# Patient Record
Sex: Female | Born: 1935 | Race: White | Hispanic: No | Marital: Married | Smoking: Never smoker
Health system: Southern US, Community
[De-identification: ages and names within clinical notes are randomized; demographics above are authoritative.]

## PROBLEM LIST (undated history)

## (undated) DIAGNOSIS — F32A Depression, unspecified: Secondary | ICD-10-CM

## (undated) DIAGNOSIS — F329 Major depressive disorder, single episode, unspecified: Secondary | ICD-10-CM

## (undated) DIAGNOSIS — E039 Hypothyroidism, unspecified: Secondary | ICD-10-CM

## (undated) DIAGNOSIS — J40 Bronchitis, not specified as acute or chronic: Secondary | ICD-10-CM

## (undated) DIAGNOSIS — G8929 Other chronic pain: Secondary | ICD-10-CM

## (undated) DIAGNOSIS — J189 Pneumonia, unspecified organism: Secondary | ICD-10-CM

## (undated) DIAGNOSIS — M1990 Unspecified osteoarthritis, unspecified site: Secondary | ICD-10-CM

## (undated) DIAGNOSIS — I509 Heart failure, unspecified: Secondary | ICD-10-CM

## (undated) DIAGNOSIS — K219 Gastro-esophageal reflux disease without esophagitis: Secondary | ICD-10-CM

## (undated) DIAGNOSIS — N184 Chronic kidney disease, stage 4 (severe): Secondary | ICD-10-CM

## (undated) DIAGNOSIS — R5381 Other malaise: Secondary | ICD-10-CM

## (undated) DIAGNOSIS — R296 Repeated falls: Secondary | ICD-10-CM

## (undated) DIAGNOSIS — I480 Paroxysmal atrial fibrillation: Secondary | ICD-10-CM

## (undated) DIAGNOSIS — R0609 Other forms of dyspnea: Secondary | ICD-10-CM

## (undated) DIAGNOSIS — W19XXXA Unspecified fall, initial encounter: Secondary | ICD-10-CM

## (undated) DIAGNOSIS — E785 Hyperlipidemia, unspecified: Secondary | ICD-10-CM

## (undated) DIAGNOSIS — I10 Essential (primary) hypertension: Secondary | ICD-10-CM

## (undated) DIAGNOSIS — F419 Anxiety disorder, unspecified: Secondary | ICD-10-CM

## (undated) DIAGNOSIS — M797 Fibromyalgia: Secondary | ICD-10-CM

## (undated) DIAGNOSIS — R0600 Dyspnea, unspecified: Secondary | ICD-10-CM

## (undated) HISTORY — PX: APPENDECTOMY: SHX54

## (undated) HISTORY — PX: KNEE SURGERY: SHX244

## (undated) HISTORY — DX: Dyspnea, unspecified: R06.00

## (undated) HISTORY — DX: Other malaise: R53.81

## (undated) HISTORY — DX: Paroxysmal atrial fibrillation: I48.0

## (undated) HISTORY — PX: LAPAROSCOPIC HYSTERECTOMY: SHX1926

## (undated) HISTORY — DX: Chronic kidney disease, stage 4 (severe): N18.4

## (undated) HISTORY — DX: Other chronic pain: G89.29

## (undated) HISTORY — DX: Repeated falls: R29.6

## (undated) HISTORY — DX: Other forms of dyspnea: R06.09

## (undated) HISTORY — PX: CHOLECYSTECTOMY: SHX55

## (undated) HISTORY — PX: HEMORRHOID SURGERY: SHX153

## (undated) HISTORY — DX: Unspecified fall, initial encounter: W19.XXXA

---

## 1999-03-23 ENCOUNTER — Ambulatory Visit (HOSPITAL_COMMUNITY): Admission: RE | Admit: 1999-03-23 | Discharge: 1999-03-23 | Payer: Self-pay | Admitting: Gastroenterology

## 2004-05-10 ENCOUNTER — Ambulatory Visit: Payer: Self-pay | Admitting: Gastroenterology

## 2004-05-15 ENCOUNTER — Ambulatory Visit (HOSPITAL_COMMUNITY): Admission: RE | Admit: 2004-05-15 | Discharge: 2004-05-15 | Payer: Self-pay | Admitting: Gastroenterology

## 2004-05-18 ENCOUNTER — Ambulatory Visit: Payer: Self-pay | Admitting: Gastroenterology

## 2004-07-03 ENCOUNTER — Ambulatory Visit: Payer: Self-pay | Admitting: Gastroenterology

## 2004-08-01 ENCOUNTER — Ambulatory Visit: Payer: Self-pay | Admitting: Gastroenterology

## 2007-06-16 ENCOUNTER — Inpatient Hospital Stay (HOSPITAL_COMMUNITY): Admission: RE | Admit: 2007-06-16 | Discharge: 2007-06-20 | Payer: Self-pay | Admitting: Specialist

## 2008-07-30 ENCOUNTER — Emergency Department (HOSPITAL_COMMUNITY): Admission: EM | Admit: 2008-07-30 | Discharge: 2008-07-30 | Payer: Self-pay | Admitting: Emergency Medicine

## 2009-07-15 ENCOUNTER — Encounter (INDEPENDENT_AMBULATORY_CARE_PROVIDER_SITE_OTHER): Payer: Self-pay | Admitting: *Deleted

## 2010-01-11 ENCOUNTER — Ambulatory Visit: Payer: Self-pay | Admitting: Cardiology

## 2010-01-26 ENCOUNTER — Encounter: Payer: Self-pay | Admitting: Cardiology

## 2010-01-26 DIAGNOSIS — R609 Edema, unspecified: Secondary | ICD-10-CM

## 2010-01-27 ENCOUNTER — Encounter: Payer: Self-pay | Admitting: Internal Medicine

## 2010-01-27 ENCOUNTER — Ambulatory Visit (HOSPITAL_COMMUNITY)
Admission: RE | Admit: 2010-01-27 | Discharge: 2010-01-27 | Payer: Self-pay | Source: Home / Self Care | Attending: Cardiology | Admitting: Cardiology

## 2010-01-27 ENCOUNTER — Ambulatory Visit: Payer: Self-pay

## 2010-01-27 ENCOUNTER — Encounter: Payer: Self-pay | Admitting: Cardiology

## 2010-01-31 ENCOUNTER — Encounter (INDEPENDENT_AMBULATORY_CARE_PROVIDER_SITE_OTHER): Payer: Self-pay | Admitting: *Deleted

## 2010-02-03 ENCOUNTER — Ambulatory Visit: Payer: Self-pay | Admitting: Gastroenterology

## 2010-03-21 NOTE — Letter (Signed)
Summary: Colonoscopy-Changed to Office Visit Letter  [facility]  [address] [place], [place] [address]   Phone: [phone]  Fax: [phone]      Jul 15, 2009 MRN: [id number]   [facility] [address] [place], [place]  [address]   Dear Ms. Hoard,   According to our records, it is time for you to schedule a Colonoscopy. However, after reviewing your medical record, I feel that an office visit would be most appropriate to more completely evaluate you and determine your need for a repeat procedure.  Please call [phone] (option #2) at your convenience to schedule an office visit. If you have any questions, concerns, or feel that this letter is in error, we would appreciate your call.   Sincerely,  Rachael Fee, M.D.  [facility] [phone]

## 2010-03-21 NOTE — Miscellaneous (Signed)
Summary: Orders Update  Clinical Lists Changes  Problems: Added new problem of EDEMA LEG (ICD-782.3) Orders: Added new Test order of Venous Duplex Lower Extremity (Venous Duplex Lower) - Signed 

## 2010-03-23 NOTE — Letter (Signed)
Summary: New Patient letter  [facility]  [address] [place], [place] [address]   Phone: [phone]  Fax: [phone]       01/31/2010 MRN: [id number]  Debra Johnston [address] [place], [place]  [address]  Dear Debra Johnston,  Welcome to the Gastroenterology Division at [facility].    You are scheduled to see Dr. Jarold Motto on 03/21/2010 at 1:30PM on the 3rd floor at [facility], [address]  We ask that you try to arrive at our office 15 minutes prior to your appointment time to allow for check-in.  We would like you to complete the enclosed self-administered evaluation form prior to your visit and bring it with you on the day of your appointment.  We will review it with you.  Also, please bring a complete list of all your medications or, if you prefer, bring the medication bottles and we will list them.  Please bring your insurance card so that we may make a copy of it.  If your insurance requires a referral to see a specialist, please bring your referral form from your primary care physician.  Co-payments are due at the time of your visit and may be paid by cash, check or credit card.     Your office visit will consist of a consult with your physician (includes a physical exam), any laboratory testing he/she may order, scheduling of any necessary diagnostic testing (e.g. x-ray, ultrasound, CT-scan), and scheduling of a procedure (e.g. Endoscopy, Colonoscopy) if required.  Please allow enough time on your schedule to allow for any/all of these possibilities.    If you cannot keep your appointment, please call [phone] to cancel or reschedule prior to your appointment date.  This allows [place] the opportunity to schedule an appointment for another patient in need of care.  If you do not cancel or reschedule by 5 p.m. the business day prior to your appointment date, you will be charged a $50.00 late cancellation/no-show fee.    Thank you for choosing   Gastroenterology for your medical needs.  We appreciate the opportunity to care for you.  Please visit [place] at our website  to learn more about our practice.                     Sincerely,                                                             The Gastroenterology Division

## 2010-05-29 LAB — BRAIN NATRIURETIC PEPTIDE: Pro B Natriuretic peptide (BNP): <30 pg/mL (ref 0.0–100.0)

## 2010-07-04 NOTE — Discharge Summary (Signed)
NAME:  Johnston, Debra               ACCOUNT NO.:  [id number]   MEDICAL RECORD NO.:  [id number]          PATIENT TYPE:  INP   LOCATION:  1601                         FACILITY:  [facility]   PHYSICIAN:  Jene Every, M.D.    DATE OF BIRTH:  Oct 19, 1927   DATE OF ADMISSION:  06/16/2007  DATE OF DISCHARGE:                               DISCHARGE SUMMARY   DATE OF DISCHARGE:  To be determined.   ADMITTING DIAGNOSES:  Degenerative joint disease right knee,  hypertension, hypercholesterolemia, gastroesophageal reflux disease,  depression, glaucoma, osteoarthritis, fibromyalgia, history of  congestive heart failure.   DISCHARGE DIAGNOSES:  Status post right total knee arthroplasty,  previous hypokalemia, hypertension resolved, postoperative blood loss  anemia, degenerative joint disease right knee, hypercholesterolemia,  gastroesophageal reflux disease, depression, glaucoma, osteoarthritis,  fibromyalgia, history of congestive heart failure.   HISTORY:  Debra Johnston is a 77 year old female with longstanding history of  knee pain.  She was initially treated with conservative treatment by Dr.  Otelia Sergeant in the past with multiple injections.  Over a period of time she  has noticed disabling symptoms and decreased activity.  X-rays do reveal  bilateral joint compartment arthritis and it was felt at this time due  to disabling nature of the patient's pain pattern that she would benefit  from a total knee arthroplasty.  The risks and benefits were discussed  with the patient as well as her family.  Medical clearance was obtained  and we did proceed.   The patient was taken to the OR on June 16, 2007, and underwent a right  total knee arthroplasty.  Surgeon Dr. Jene Every, assistant Roma Schanz, P.A.  Anesthesia general.  Complications none.  Consults PT/OT,  care management.   LABORATORY DATA:  Preoperative CBC shows white cell count of 10.3,  hemoglobin 12.9, hematocrit 36.3.  This was  followed throughout the  hospital course.  The patient's hemoglobin did drop to a low of 8.8 with  hematocrit 25.5.  She was symptomatic with tachycardia and hypertension.  She was then transfused with 2 units packed red blood cells.  At the  time of discharge she had stabilized with a hemoglobin of 10.9 and  hematocrit 32.0.  Preop coagulation studies were within normal range.  The patient was then placed on Coumadin postoperatively.  At time of  discharge INR is 2.0.  Preoperative chemistries showed a sodium of 139,  potassium 4.2, slightly elevated glucose.  She had a normal BUN and  creatinine.  This was followed throughout the hospital course.  She did  have a slight drop in her potassium 3.1 and this was supplemented.  She  will need continued followup as noted at discharge as glucose remains  slightly elevated and BUN and creatinine remain within normal range.  Preoperative urinalysis was negative.  Preoperative chest x-ray showed  no evidence of active cardiopulmonary disease.  Preoperative EKG shows  normal sinus rhythm.   HOSPITAL COURSE:  The patient was admitted and taken to the OR and  underwent the above stated procedure and one Hemovac drain was placed  intraoperatively.  She  was then transferred to the PACU and to the floor  for continuous postoperative care.  The patient was placed on Coumadin  postoperatively for DVT prophylaxis.  PCA was utilized for pain relief.  Postoperative day #1 the patient was doing very well.  She did complain  of some nausea probably secondary to the PCA.  She denied any chest pain  or shortness of breath.  She was afebrile.  Vital signs were stable.  She did have minimal drainage over dressing.  Motor and neurovascular  function was intact.  At this time the patient was weaned from her PCA  and encouraged elevation, encouraged in incentive spirometer.  Case  management was consulted for suitable nursing facility placement.  The  patient did  progress slowly with therapy over the course of the next  several days.  Postoperative day #2 the patient was doing a little bit  better.  She did complain of some abdominal gas.  She denied any nausea.  Said the pain was fairly well controlled with p.o. medications.  Vital  signs remained stable.  She was afebrile.  Hemovac drain was removed  with tip intact.  Dressing was changed.  Incision was clean, dry and  intact.  She did have some mild ecchymosis along the incision distally.  Calf soft, nontender without evidence of DVT.  Laboratory data remained  stable.  We did start the patient on Trinsicon b.i.d.  Again, the  patient continued to progress slowly with therapy.  On postoperative day  #3 unfortunately she had a hypertensive event.  Hemoglobin did drop to a  low of 8.8 and it was felt at this time that she would benefit from  transfusion.  Thus she was transfused 2 units packed red blood cells.  She did fine from this.  Coumadin was continued.  INR was 1.8 at the  time.  She had a slightly low potassium at 3.1 and this was  supplemented.  Postoperative day #4 the patient continued to complain of  some nausea.  Per husband she has a history of anxiety and deals with  nausea at home.  This was usually treated with Ativan which was resumed.  Hemoglobin had stabilized at the level of 10.9, hematocrit of 32.  The  patient did have a hypertensive event but according to the nursing staff  she has been up, ambulating, was very anxious and had a bowel movement  at the time this was obtained.  Following that pressure did return to  baseline.  Repeat labs did show a continued decreased potassium at 3.1  and this again was supplemented.  INR remained stable at 2.0.  The  incision was clean and dry.  She continued to have some residual  ecchymosis along the incision as well as down to the calf.  She had  negative Homans.  Motor and neurovascular function remained intact to  the lower  extremity.   DISPOSITION:  The patient, once pressure has stabilized, will plan on  discharging her to [facility].  She will continue to need to  have lab values done watching her Coumadin closely.  Goal INR of around  2.  She will also need her potassium followed and may need to continue  with supplementation after discharge.  Also monitoring of her hemoglobin  and hematocrit.  Question they may need to adjust her blood pressure  medication too secondary to her anxiety.   FOLLOWUP:  Office visit, she should follow up with Dr. Shelle Iron in  approximately 10-14 days for suture removal and x-rays.   ACTIVITY:  She is to be weightbearing as tolerated.  She should continue  elevation with knee above heart level at least 6 times a day for 20  minutes at a time.  She will be weightbearing as tolerated, utilizing  her knee immobilizer when out of bed until she can straight leg raise  x10.  The patient needs constant motivation to do her exercises.  May  even need to utilize the CPM 6 hours a day starting at 0 to 70 degrees  and increasing by 10 degrees each day.   DISCHARGE MEDICATIONS:  1. Simvastatin 40 mg once a day daily at bedtime.  2. Carvedilol 12.5 mg one p.o. b.i.d.  3. Micardis/HCT 80 mg/12.5 one p.o. q. a.m.  4. Azar 1.5% eye drops one drop each eye twice a day.  5. Levothyroxine 15 mcg one p.o. q. a.m.  6. Protonix 40 mg one p.o. q. a.m.  7. Lorazepam 2 mg one p.o. t.i.d.  8. Symbyax 6.25 mg with lunch.  9. Estradiol 1 mg one p.o. q. a.m.  10.Ambien CR 12.5 mg one p.o. daily at bedtime.  11.Vitamin C 500 mg daily.  12.Caltrate 600 plus vitamin D.  13.Coumadin as per pharmacy.  14.Norco 5/325 one to two p.o. q.4-6 h. p.r.n. pain.  15.Colace 100 mg one p.o. daily.  16.Currently we will start her on potassium supplementation K-Dur 40      mEq one p.o. b.i.d.   DIET:  As tolerated.   DISCHARGE CONDITION:  Improved, stable.   FINAL DIAGNOSIS:  Status post right total  knee arthroplasty.      Roma Schanz, P.A.      Jene Every, M.D.  Electronically Signed    CS/MEDQ  D:  06/20/2007  T:  06/20/2007  Job:  [id number]

## 2010-07-04 NOTE — H&P (Signed)
NAME:  Debra Johnston, Debra Johnston               ACCOUNT NO.:  [id number]   MEDICAL RECORD NO.:  [id number]          PATIENT TYPE:  INP   LOCATION:  NA                           FACILITY:  [facility]   PHYSICIAN:  Jene Every, M.D.    DATE OF BIRTH:  05/22/1927   DATE OF ADMISSION:  06/16/2007  DATE OF DISCHARGE:                              HISTORY & PHYSICAL   CHIEF COMPLAINTS:  Right knee pain.   HISTORY:  Ms. Marcantonio is a pleasant, 77 year old female with a long-  standing history of knee pain initially treated by Dr. Otelia Sergeant in the  past.  She has undergone multiple injections by Dr. Otelia Sergeant as well as her  medical physician with a progressive worsening of her symptoms.  Exam  does show lack of full range of motion.  X-rays reveal bilateral  tricompartmental osteoarthritis.  I feel at this time due to disabling  nature of the patient's pain pattern that she would benefit from a total  knee arthroplasty.  The risks and benefits of this were discussed as  well as medical clearance obtained.  The patient does elect to proceed.   MEDICAL HISTORY:  1. Hypertension.  2. Hypercholesteremia.  3. Gastroesophageal reflux disease.  4. Depression.  5. Glaucoma.  6. Osteoarthritis.  7. Fibromyalgia.  8. The patient has a history of CHF.   CURRENT MEDICATIONS:  1. Micardis HCTZ 80 mg/12.5 mg 1 p.o. daily.  2. Coreg 12.5 mg 1 p.o. daily.  3. Symbyax 6.25 mg 1 p.o. daily.  4. Estrace 1 mg 1 p.o. daily.  5. Protonix 40 mg 1 p.o. daily.  6. Levothyroxine 50 mg 1 p.o. daily.  7. Zocor 40 mg 1 p.o. daily.  8. Ambien 12.5 mg 1 p.o. q.h.s.  9. Lorazepam 2 mg 1 p.o. t.i.d. p.r.n.  10.Aspirin 81 mg daily.  11.Darvocet 1 p.o. q.4 h. p.r.n.  12.Calcium.  13.ICaps eye vitamins.  14.Azopt 1% optic solution eye drops twice daily.   ALLERGIES:  PENICILLIN which causes a rash.   PREVIOUS SURGERIES:  Hemorrhoidectomy, cholecystectomy, appendectomy,  hysterectomy, mass removed from kidney which was found  cancerous.   SOCIAL HISTORY:  The patient is married.  She denies any tobacco,  alcohol consumption.  Husband will be her caregiver following surgery.  Primary care physician is Dr. Miguel Aschoff at [facility].   FAMILY HISTORY:  Mother and father both passed from coronary artery  disease.  Mother at the age of 54.  Father age of 81.   REVIEW OF SYSTEMS:  GENERAL:  The patient denies any fever, chills,  night sweats, or bleeding tendencies.  CNS:  No blurry or double vision,  seizure, headache, or paralysis.  RESPIRATORY:  No shortness of breath,  productive cough, or hemoptysis.  CARDIOVASCULAR:  No chest pain,  angina, or orthopnea.  GU: The patient does have urinary urgency.  No  dysuria, hematuria, or discharge.  She also has a tendency for yeast  infection with any antibiotics.  GI:  No nausea, vomiting, diarrhea,  constipation, melena, or bloody stools.  MUSCULOSKELETAL:  As per HPI.   PHYSICAL  EXAMINATION:  VITAL SIGNS:  Respiratory:  12.  BP:  128/80.  Pulse:  60.  GENERAL:  This is a well-nourished female seen upright in no acute  distress.  In general, she is slightly anxious.  HEENT:  Atraumatic, normocephalic.  Pupils equal, round, and reactive to  light.  EOMs intact.  NECK:  Supple.  No lymphadenopathy.  CHEST:  Clear to auscultation bilaterally.  No rhonchi, wheezes, or  rales.  BREASTS/GU:  Not examined, pertinent HPI.  HEART:  Regular rate and rhythm without murmurs, gallops, or rubs.  ABDOMEN:  Soft, nontender, nondistended.  Bowel sounds x4.  SKIN:  No rashes or lesions are noted over the knee.  EXTREMITIES:  The patient does have mild effusion.  She is tender to  palpation along the medial joint line on the right.  Range of motion is  0 to approximately 100 degrees.   X-rays were reviewed which show tricompartmental degenerative changes on  the right knee.   IMPRESSION/PLAN:  The patient be admitted to [facility] to  undergo a right total  knee arthroplasty.  We will give her Diflucan  postoperatively to prevent yeast infection.  Also, may need to consult  [facility] to monitor her fluid balance postoperatively due to  her history of congestive heart failure.      Roma Schanz, P.A.      Jene Every, M.D.  Electronically Signed    CS/MEDQ  D:  06/12/2007  T:  06/12/2007  Job:  [id number]

## 2010-07-04 NOTE — Op Note (Signed)
NAME:  Debra Johnston, Debra Johnston               ACCOUNT NO.:  [id number]   MEDICAL RECORD NO.:  [id number]          PATIENT TYPE:  INP   LOCATION:  0004                         [facility]   PHYSICIAN:  Jene Every, M.D.    DATE OF BIRTH:  20-Dec-1927   DATE OF PROCEDURE:  06/16/2007  DATE OF DISCHARGE:                               OPERATIVE REPORT   PREOPERATIVE DIAGNOSIS:  Degenerative joint disease of the right knee.   POSTOPERATIVE DIAGNOSIS:  Degenerative joint disease of the right knee.   PROCEDURE PERFORMED:  Right total knee arthroplasty.   ANESTHESIA:  General.   ASSISTANT:  Roma Schanz, P.A.   BRIEF HISTORY AND INDICATION:  [age]-year-old with right knee  pain, DJD and chondrocalcinosis.  Operative intervention is indicated  for replacement of degenerated joint which is refractory to conservative  treatment.  Risk and benefits were discussed including bleeding,  infection, injury to vascular structures, suboptimal range of motion,  DVT, PE, need for revision etc.   TECHNIQUE:  With the patient in supine position, after the induction of  adequate general anesthesia and 2 grams of Kefzol, the right lower  extremity was prepped and draped and exsanguinated in the usual sterile  fashion.  The thigh tourniquet was inflated to 325 mmHg.  Incision was  made in the midline and a medial parapatellar arthrotomy was performed.  Patella was everted.  Tricompartmental osteoarthrosis was noted.  We  removed the remnants of the medial and lateral menisci and the  meniscotibial attachment.  We gently elevated the soft tissues medially.  The knee was then flexed and remnant of the ACL removed;  chondrocalcinosis was noted.  The Stryker drill was utilized to enter  the femur; this was irrigated and evacuated and measured as a 4.  The  distal femoral jig was applied.  Ten millimeters were taken off the  distal femur utilizing an oscillating saw, protecting the soft tissues  posteriorly.  Next, again it was measured as a 4 off the anterior  cortex.  This was pinned.  The distal femoral cutting guide was then  applied and the appropriate external rotation and the anterior,  posterior and chamfer cuts were performed.  The soft tissues were well  protected.  Next, the patella was measured to be 26 in thickness.  We  removed 8 from the patella using the patellar planer and patellar planed  to a 16, measured at a 32.  We then turned attention towards the tibia.  The tibia was subluxed and posterior remnant of the menisci removed,  popliteus preserved.  We used the external alignment jig, bisecting the  ankle anterior to the tibia and a 0-degree cut, 6 off of the high side,  although both were near identical and 10 appeared to be too much of a  cut and we used a 6.  This was then pinned.  Varus-valgus alignment  looked appropriate.  We used an oscillating saw to perform a tibial cut.  We then used a spacer; flexion and extension gap was equivalent.  A 10  was felt to be good to full extension.  The knee was then flexed.  Tibia  was prepared again at a size 4 with the rotation just medial to the  tibial tubercle, full coverage.  There was some posterolateral defect  noted; this was curetted.  This was small, about 1 x 1 cm.  I used a  punch guide in the central drill hole.  We then put a trial tibia, a  trial platform and trial femur; prior to the trial femur, we used a box  cut which was cut from the femur and protected the bone posteriorly.  We  put a trial femur 4, a 4 tibia, 10 insert, full extension, good flexion,  no anterior drawer, good stability with varus/valgus stress at 0-30  degrees.  We drilled the patellar holes as well for the pegs,  medializing its placement.  I felt it required a bit of retinacular  release, which was performed non-full-thickness; good flexion and good  extension, good stability and good tracking of patella were noted.  All  trials  were removed.  Pulsatile lavage cleaned the bony surfaces.  I  inspected it posteriorly; the popliteus was intact.  No further  debridement was required.  Knee flexed, all surfaces dried, Gelfoam  placed in the distal part of the tibia to restrict the cement flow.  We  mixed the cement in the appropriate fashion and after drying the tibia,  injected over the tibia.  The 4 tibia was impacted into place, cement  removed, cement placed on the femur and impacted the femur on the distal  femur, redundant cement removed.  We cemented a patellar button with a  clamp, put a 10 insert, reduced it in extension, axial load applied,  redundant cement removed.  Cement was allowed to cure.  Good extension  and flexion to 140, good stability, no anterior drawer.  We removed the  trial 10, redundant cement removed, put a permanent 10 in, impacted it  with good flexion, good extension and good stability.  Hemovac was  placed and brought out through a lateral stab wound in the skin and  repaired the patellar retinaculum with #1 Vicryl interrupted figure-of-  eight sutures, subcutaneous tissue reapproximated with 0 and 2-0 Vicryl  simple sutures and skin was reapproximated with staples, again, good  flexion and extension; 90 degrees of flexion was noted against gravity.  Good patellar tracking was noted.  Marcaine 0.25% with epinephrine was  placed in the joint just prior to release of the tourniquet.  There was  good revascularization of the lower extremity appreciated after the  tourniquet was deflated.  Again, the wound was dressed sterilely and  secured with Ace bandages.  The patient was then extubated without  difficulty and transported to the recovery room in satisfactory  condition.   The patient tolerated the procedure well, no complications.  Approximate  tourniquet time was an hour and a half system.      Jene Every, M.D.  Electronically Signed     JB/MEDQ  D:  06/16/2007  T:   06/16/2007  Job:  [id number]

## 2010-11-14 LAB — BASIC METABOLIC PANEL
BUN: 8
BUN: 8
CO2: 29
Calcium: 8.4
Chloride: 102
Chloride: 104
Chloride: 107
Creatinine, Ser: 0.97
GFR calc Af Amer: 58 — ABNORMAL LOW
GFR calc Af Amer: >60
GFR calc Af Amer: >60
GFR calc non Af Amer: 56 — ABNORMAL LOW
Glucose, Bld: 132 — ABNORMAL HIGH
Potassium: 3.1 — ABNORMAL LOW
Potassium: 4
Sodium: 138
Sodium: 139

## 2010-11-14 LAB — TYPE AND SCREEN
ABO/RH(D): O POS
Antibody Screen: NEGATIVE

## 2010-11-14 LAB — URINALYSIS, ROUTINE W REFLEX MICROSCOPIC
Bilirubin Urine: NEGATIVE
Glucose, UA: NEGATIVE
Hgb urine dipstick: NEGATIVE
Ketones, ur: NEGATIVE
Nitrite: NEGATIVE
Protein, ur: NEGATIVE
Specific Gravity, Urine: 1.016
Urobilinogen, UA: 0.2
pH: 6

## 2010-11-14 LAB — CBC
HCT: 25.5 — ABNORMAL LOW
HCT: 26.6 — ABNORMAL LOW
HCT: 29.3 — ABNORMAL LOW
HCT: 36.3
Hemoglobin: 10 — ABNORMAL LOW
Hemoglobin: 9 — ABNORMAL LOW
MCHC: 34.2
MCHC: 34.4
MCHC: 35.4
MCV: 91.3
MCV: 91.5
MCV: 93
Platelets: 251
Platelets: 272
Platelets: 331
RBC: 2.86 — ABNORMAL LOW
RBC: 3.15 — ABNORMAL LOW
RDW: 13.5
RDW: 14.4
RDW: 14.9
WBC: 11.1 — ABNORMAL HIGH
WBC: 11.9 — ABNORMAL HIGH

## 2010-11-14 LAB — DIFFERENTIAL
Basophils Absolute: 0.1
Basophils Relative: 1
Eosinophils Absolute: 0.4
Eosinophils Relative: 4
Lymphocytes Relative: 19
Monocytes Relative: 5
Neutro Abs: 7.3

## 2010-11-14 LAB — APTT: aPTT: 31

## 2010-11-14 LAB — COMPREHENSIVE METABOLIC PANEL
ALT: 25
AST: 29
Alkaline Phosphatase: 80
Calcium: 10.2
Creatinine, Ser: 0.98
Potassium: 4.2
Sodium: 139
Total Bilirubin: 0.8
Total Protein: 6.9

## 2010-11-14 LAB — ABO/RH: ABO/RH(D): O POS

## 2010-11-14 LAB — PREPARE RBC (CROSSMATCH)

## 2010-11-22 ENCOUNTER — Telehealth: Payer: Self-pay

## 2010-11-22 NOTE — Telephone Encounter (Signed)
Pt does not want to schedule office visit at this time, she will call with any problems or concerns

## 2011-02-20 HISTORY — PX: TRANSTHORACIC ECHOCARDIOGRAM: SHX275

## 2011-02-22 ENCOUNTER — Other Ambulatory Visit: Payer: Self-pay

## 2011-02-22 ENCOUNTER — Inpatient Hospital Stay (HOSPITAL_COMMUNITY)
Admission: EM | Admit: 2011-02-22 | Discharge: 2011-03-04 | DRG: 292 | Disposition: A | Discharge status: Home Health | Payer: Medicare Other | Attending: Internal Medicine | Admitting: Internal Medicine

## 2011-02-22 ENCOUNTER — Emergency Department (HOSPITAL_COMMUNITY): Payer: Medicare Other

## 2011-02-22 ENCOUNTER — Encounter: Payer: Self-pay | Admitting: Emergency Medicine

## 2011-02-22 DIAGNOSIS — I10 Essential (primary) hypertension: Secondary | ICD-10-CM | POA: Diagnosis present

## 2011-02-22 DIAGNOSIS — J40 Bronchitis, not specified as acute or chronic: Secondary | ICD-10-CM

## 2011-02-22 DIAGNOSIS — E669 Obesity, unspecified: Secondary | ICD-10-CM | POA: Diagnosis present

## 2011-02-22 DIAGNOSIS — IMO0001 Reserved for inherently not codable concepts without codable children: Secondary | ICD-10-CM | POA: Diagnosis present

## 2011-02-22 DIAGNOSIS — I4891 Unspecified atrial fibrillation: Secondary | ICD-10-CM | POA: Diagnosis present

## 2011-02-22 DIAGNOSIS — I2510 Atherosclerotic heart disease of native coronary artery without angina pectoris: Secondary | ICD-10-CM | POA: Diagnosis present

## 2011-02-22 DIAGNOSIS — I509 Heart failure, unspecified: Secondary | ICD-10-CM | POA: Diagnosis present

## 2011-02-22 DIAGNOSIS — E039 Hypothyroidism, unspecified: Secondary | ICD-10-CM | POA: Diagnosis present

## 2011-02-22 DIAGNOSIS — N289 Disorder of kidney and ureter, unspecified: Secondary | ICD-10-CM

## 2011-02-22 DIAGNOSIS — J209 Acute bronchitis, unspecified: Secondary | ICD-10-CM | POA: Diagnosis present

## 2011-02-22 DIAGNOSIS — R5381 Other malaise: Secondary | ICD-10-CM | POA: Diagnosis present

## 2011-02-22 DIAGNOSIS — I5033 Acute on chronic diastolic (congestive) heart failure: Principal | ICD-10-CM | POA: Diagnosis present

## 2011-02-22 DIAGNOSIS — R609 Edema, unspecified: Secondary | ICD-10-CM

## 2011-02-22 DIAGNOSIS — K219 Gastro-esophageal reflux disease without esophagitis: Secondary | ICD-10-CM | POA: Diagnosis present

## 2011-02-22 DIAGNOSIS — J45901 Unspecified asthma with (acute) exacerbation: Secondary | ICD-10-CM | POA: Diagnosis present

## 2011-02-22 HISTORY — DX: Anxiety disorder, unspecified: F41.9

## 2011-02-22 HISTORY — DX: Essential (primary) hypertension: I10

## 2011-02-22 HISTORY — DX: Bronchitis, not specified as acute or chronic: J40

## 2011-02-22 HISTORY — DX: Unspecified osteoarthritis, unspecified site: M19.90

## 2011-02-22 HISTORY — DX: Hyperlipidemia, unspecified: E78.5

## 2011-02-22 HISTORY — DX: Heart failure, unspecified: I50.9

## 2011-02-22 HISTORY — DX: Major depressive disorder, single episode, unspecified: F32.9

## 2011-02-22 HISTORY — DX: Hypothyroidism, unspecified: E03.9

## 2011-02-22 HISTORY — DX: Gastro-esophageal reflux disease without esophagitis: K21.9

## 2011-02-22 HISTORY — DX: Fibromyalgia: M79.7

## 2011-02-22 HISTORY — DX: Depression, unspecified: F32.A

## 2011-02-22 LAB — CARDIAC PANEL(CRET KIN+CKTOT+MB+TROPI)
CK, MB: 2.7 ng/mL (ref 0.3–4.0)
CK, MB: 3.2 ng/mL (ref 0.3–4.0)
Relative Index: 1.2 (ref 0.0–2.5)
Total CK: 225 U/L — ABNORMAL HIGH (ref 7–177)
Total CK: 334 U/L — ABNORMAL HIGH (ref 7–177)
Troponin I: <0.3 ng/mL (ref <0.30)

## 2011-02-22 LAB — CBC
HCT: 35.4 % — ABNORMAL LOW (ref 36.0–46.0)
HCT: 35.3 % — ABNORMAL LOW (ref 36.0–46.0)
MCV: 98.6 fL (ref 78.0–100.0)
MCV: 97.8 fL (ref 78.0–100.0)
RBC: 3.59 MIL/uL — ABNORMAL LOW (ref 3.87–5.11)
RBC: 3.61 MIL/uL — ABNORMAL LOW (ref 3.87–5.11)
RDW: 14.8 % (ref 11.5–15.5)
WBC: 8 10*3/uL (ref 4.0–10.5)
WBC: 8.9 10*3/uL (ref 4.0–10.5)

## 2011-02-22 LAB — BASIC METABOLIC PANEL
CO2: 28 mEq/L (ref 19–32)
Calcium: 9.8 mg/dL (ref 8.4–10.5)
Creatinine, Ser: 1.27 mg/dL — ABNORMAL HIGH (ref 0.50–1.10)
Glucose, Bld: 114 mg/dL — ABNORMAL HIGH (ref 70–99)

## 2011-02-22 LAB — DIFFERENTIAL
Eosinophils Relative: 8 % — ABNORMAL HIGH (ref 0–5)
Lymphocytes Relative: 20 % (ref 12–46)
Lymphs Abs: 1.6 10*3/uL (ref 0.7–4.0)
Monocytes Absolute: 0.9 10*3/uL (ref 0.1–1.0)

## 2011-02-22 LAB — MRSA PCR SCREENING: MRSA by PCR: NEGATIVE

## 2011-02-22 LAB — HEMOGLOBIN A1C
Hgb A1c MFr Bld: 6 % — ABNORMAL HIGH (ref <5.7)
Mean Plasma Glucose: 126 mg/dL — ABNORMAL HIGH (ref <117)

## 2011-02-22 LAB — CREATININE, SERUM
GFR calc Af Amer: 46 mL/min — ABNORMAL LOW (ref >90)
GFR calc non Af Amer: 40 mL/min — ABNORMAL LOW (ref >90)

## 2011-02-22 MED ORDER — BENZONATATE 100 MG PO CAPS
200.0000 mg | ORAL_CAPSULE | Freq: Three times a day (TID) | ORAL | Status: AC
  Administered 2011-02-22 – 2011-02-24 (×6): 200 mg via ORAL
  Filled 2011-02-22 (×7): qty 2

## 2011-02-22 MED ORDER — ACETAMINOPHEN 325 MG PO TABS
650.0000 mg | ORAL_TABLET | ORAL | Status: DC | PRN
  Administered 2011-02-23 – 2011-03-04 (×16): 650 mg via ORAL
  Filled 2011-02-22 (×17): qty 2

## 2011-02-22 MED ORDER — SODIUM CHLORIDE 0.9 % IJ SOLN
3.0000 mL | INTRAMUSCULAR | Status: DC | PRN
  Administered 2011-02-24: 3 mL via INTRAVENOUS

## 2011-02-22 MED ORDER — ZOLPIDEM TARTRATE 5 MG PO TABS
5.0000 mg | ORAL_TABLET | Freq: Every evening | ORAL | Status: DC | PRN
  Administered 2011-02-22: 5 mg via ORAL
  Filled 2011-02-22 (×2): qty 1

## 2011-02-22 MED ORDER — PROSIGHT PO TABS
1.0000 | ORAL_TABLET | Freq: Every day | ORAL | Status: DC
  Administered 2011-02-22 – 2011-03-04 (×11): 1 via ORAL
  Filled 2011-02-22 (×12): qty 1

## 2011-02-22 MED ORDER — TRAMADOL HCL 50 MG PO TABS
50.0000 mg | ORAL_TABLET | Freq: Four times a day (QID) | ORAL | Status: DC | PRN
  Administered 2011-02-22 – 2011-03-04 (×11): 50 mg via ORAL
  Filled 2011-02-22 (×13): qty 1

## 2011-02-22 MED ORDER — OLMESARTAN MEDOXOMIL 20 MG PO TABS
20.0000 mg | ORAL_TABLET | Freq: Every day | ORAL | Status: DC
  Administered 2011-02-22 – 2011-02-25 (×4): 20 mg via ORAL
  Filled 2011-02-22 (×5): qty 1

## 2011-02-22 MED ORDER — FERROUS SULFATE 325 (65 FE) MG PO TABS
325.0000 mg | ORAL_TABLET | ORAL | Status: DC
  Administered 2011-02-22 – 2011-03-04 (×6): 325 mg via ORAL
  Filled 2011-02-22 (×6): qty 1

## 2011-02-22 MED ORDER — FUROSEMIDE 10 MG/ML IJ SOLN
40.0000 mg | Freq: Once | INTRAMUSCULAR | Status: AC
  Administered 2011-02-22: 40 mg via INTRAVENOUS
  Filled 2011-02-22: qty 4

## 2011-02-22 MED ORDER — ICAPS PO CAPS: 1.0000 | ORAL_CAPSULE | Freq: Every day | ORAL | Status: DC

## 2011-02-22 MED ORDER — ASPIRIN EC 81 MG PO TBEC
81.0000 mg | DELAYED_RELEASE_TABLET | Freq: Every day | ORAL | Status: DC
  Administered 2011-02-23 – 2011-03-04 (×10): 81 mg via ORAL
  Filled 2011-02-22 (×10): qty 1

## 2011-02-22 MED ORDER — SODIUM CHLORIDE 0.9 % IV SOLN
INTRAVENOUS | Status: DC
  Administered 2011-02-22 (×2): via INTRAVENOUS

## 2011-02-22 MED ORDER — SODIUM CHLORIDE 0.9 % IV SOLN: 250.0000 mL | INTRAVENOUS | Status: DC | PRN

## 2011-02-22 MED ORDER — LORAZEPAM 0.5 MG PO TABS
2.0000 mg | ORAL_TABLET | Freq: Three times a day (TID) | ORAL | Status: DC | PRN
  Administered 2011-02-22 – 2011-03-03 (×13): 2 mg via ORAL
  Filled 2011-02-22: qty 3
  Filled 2011-02-22: qty 2
  Filled 2011-02-22 (×2): qty 4
  Filled 2011-02-22: qty 1
  Filled 2011-02-22 (×5): qty 2
  Filled 2011-02-22: qty 4
  Filled 2011-02-22 (×2): qty 2
  Filled 2011-02-22 (×2): qty 4

## 2011-02-22 MED ORDER — ALPRAZOLAM 0.25 MG PO TABS
0.2500 mg | ORAL_TABLET | Freq: Two times a day (BID) | ORAL | Status: DC | PRN
  Filled 2011-02-22: qty 1

## 2011-02-22 MED ORDER — OLANZAPINE 5 MG PO TABS
5.0000 mg | ORAL_TABLET | Freq: Every day | ORAL | Status: DC
  Administered 2011-02-22 – 2011-03-03 (×10): 5 mg via ORAL
  Filled 2011-02-22 (×11): qty 1

## 2011-02-22 MED ORDER — GUAIFENESIN-DM 100-10 MG/5ML PO SYRP
5.0000 mL | ORAL_SOLUTION | ORAL | Status: DC | PRN
  Administered 2011-02-22 – 2011-03-04 (×23): 5 mL via ORAL
  Filled 2011-02-22 (×25): qty 5

## 2011-02-22 MED ORDER — FUROSEMIDE 10 MG/ML IJ SOLN
40.0000 mg | Freq: Two times a day (BID) | INTRAMUSCULAR | Status: DC
  Administered 2011-02-22 – 2011-02-24 (×4): 40 mg via INTRAVENOUS
  Filled 2011-02-22 (×6): qty 4

## 2011-02-22 MED ORDER — IPRATROPIUM BROMIDE 0.02 % IN SOLN
0.5000 mg | Freq: Once | RESPIRATORY_TRACT | Status: AC
  Administered 2011-02-22: 0.5 mg via RESPIRATORY_TRACT
  Filled 2011-02-22: qty 2.5

## 2011-02-22 MED ORDER — LEVOTHYROXINE SODIUM 75 MCG PO TABS
75.0000 ug | ORAL_TABLET | Freq: Every day | ORAL | Status: DC
  Administered 2011-02-22 – 2011-03-04 (×11): 75 ug via ORAL
  Filled 2011-02-22 (×11): qty 1

## 2011-02-22 MED ORDER — FLUOXETINE HCL 20 MG PO CAPS
20.0000 mg | ORAL_CAPSULE | Freq: Every day | ORAL | Status: DC
  Administered 2011-02-22 – 2011-03-03 (×10): 20 mg via ORAL
  Filled 2011-02-22 (×12): qty 1

## 2011-02-22 MED ORDER — AZITHROMYCIN 250 MG PO TABS
250.0000 mg | ORAL_TABLET | Freq: Every day | ORAL | Status: AC
  Administered 2011-02-23 – 2011-02-26 (×4): 250 mg via ORAL
  Filled 2011-02-22 (×4): qty 1

## 2011-02-22 MED ORDER — PNEUMOCOCCAL VAC POLYVALENT 25 MCG/0.5ML IJ INJ
0.5000 mL | INJECTION | INTRAMUSCULAR | Status: AC
  Administered 2011-02-23: 0.5 mL via INTRAMUSCULAR
  Filled 2011-02-22: qty 0.5

## 2011-02-22 MED ORDER — NITROGLYCERIN 0.4 MG SL SUBL: 0.4000 mg | SUBLINGUAL_TABLET | SUBLINGUAL | Status: DC | PRN

## 2011-02-22 MED ORDER — ONDANSETRON HCL 4 MG/2ML IJ SOLN: 4.0000 mg | Freq: Four times a day (QID) | INTRAMUSCULAR | Status: DC | PRN

## 2011-02-22 MED ORDER — POTASSIUM CHLORIDE CRYS ER 20 MEQ PO TBCR
20.0000 meq | EXTENDED_RELEASE_TABLET | Freq: Two times a day (BID) | ORAL | Status: DC
  Administered 2011-02-22 – 2011-02-25 (×8): 20 meq via ORAL
  Filled 2011-02-22 (×10): qty 1

## 2011-02-22 MED ORDER — ENOXAPARIN SODIUM 40 MG/0.4ML ~~LOC~~ SOLN
40.0000 mg | Freq: Every day | SUBCUTANEOUS | Status: DC
  Administered 2011-02-22 – 2011-03-03 (×10): 40 mg via SUBCUTANEOUS
  Filled 2011-02-22 (×12): qty 0.4

## 2011-02-22 MED ORDER — CARVEDILOL 25 MG PO TABS
25.0000 mg | ORAL_TABLET | Freq: Two times a day (BID) | ORAL | Status: DC
  Administered 2011-02-22 – 2011-02-27 (×10): 25 mg via ORAL
  Filled 2011-02-22 (×15): qty 1

## 2011-02-22 MED ORDER — SODIUM CHLORIDE 0.9 % IJ SOLN
3.0000 mL | Freq: Two times a day (BID) | INTRAMUSCULAR | Status: DC
  Administered 2011-02-22 – 2011-03-04 (×20): 3 mL via INTRAVENOUS

## 2011-02-22 MED ORDER — AZITHROMYCIN 500 MG PO TABS
500.0000 mg | ORAL_TABLET | Freq: Every day | ORAL | Status: AC
  Administered 2011-02-22: 500 mg via ORAL
  Filled 2011-02-22: qty 1

## 2011-02-22 MED ORDER — PANTOPRAZOLE SODIUM 40 MG PO TBEC
40.0000 mg | DELAYED_RELEASE_TABLET | Freq: Every day | ORAL | Status: DC
  Administered 2011-02-22 – 2011-03-04 (×11): 40 mg via ORAL
  Filled 2011-02-22 (×11): qty 1

## 2011-02-22 MED ORDER — ALBUTEROL SULFATE (5 MG/ML) 0.5% IN NEBU
5.0000 mg | INHALATION_SOLUTION | Freq: Once | RESPIRATORY_TRACT | Status: AC
  Administered 2011-02-22: 5 mg via RESPIRATORY_TRACT
  Filled 2011-02-22: qty 1

## 2011-02-22 NOTE — ED Notes (Signed)
Pt ambulated to the bathroom at this time.

## 2011-02-22 NOTE — ED Notes (Signed)
Pt ambulated from the room to the nurses' station and back without oxygen and pt was stating 93-94% RA

## 2011-02-22 NOTE — ED Notes (Signed)
Pt moved to room 30. Resting quietly in bed at this time. Resp even and unlabored. Denies pain. Husband at bedside. Awaiting room for admission.

## 2011-02-22 NOTE — H&P (Signed)
History and Physical  Patient ID: [name] Patient ID: ERINN MENDOSA MRN: [id number], DOB/AGE: Aug 30, 1927 78 y.o. Date of Encounter: 02/22/2011  Primary Physician: Miguel Aschoff, MD Primary Cardiologist: was Dr Deborah Chalk   Chief Complaint: SOB  HPI: Beuna R Schaum is an 78 year old female with a history of chronic diastolic CHF. She has been managed by Dr. Duane Lope. He had previously increased her diuretics with improvement in her symptoms.   Over the last 3 weeks, Ms. Plack developed increasing dyspnea on exertion. She also describes orthopnea and PND. She had increasing lower extremity edema. She has not had chest pain. She has not had palpitations. She admits that when she exerts herself sometimes she gets so short of breath she gets a little light-headed. Her symptoms have progressed despite compliance with her medications but was unable to increase her Lasix to 80 mg daily as to record because of the frequent urination it caused. She has not seen Dr. Tenny Craw during this time.   4 days ago, she began coughing. The cough was nonproductive. She thinks she might have been wheezing a little as well. She doesn't think she had a fever. Today, her symptoms progressed to the point that she was significantly short of breath at rest so she came to the emergency room. Initially, in the emergency room, her O2 saturation was approximately 85%. She was placed on O2. She has had a nebulizer breathing treatment and IV Lasix. She does not think her respiratory status is much improved, although her O2 saturation is 95% on O2.   Past Medical History  Diagnosis Date  . Hypertension   . CHF (congestive heart failure)   . Coronary artery disease     Echo - Study Conclusions   - Left ventricle: The cavity size was normal. There was mild focal basal hypertrophy of the septum. Systolic function was normal. The estimated ejection fraction was in the range of 60% to 65%. Wall motion was normal; there were no  regional wall motion     abnormalities. Features are consistent with a pseudonormal left ventricular filling pattern, with concomitant abnormal relaxation and increased filling pressure (grade 2 diastolic dysfunction). - Mitral valve: Mild regurgitation. 01/27/2010  . GERD (gastroesophageal reflux disease)      Surgical History: Past Surgical History  Procedure Date   Bilateral knee surgery    mass removed from kidney which was found cancerous 1970s  . Hemorrhoid surgery   . Cholecystectomy   . Appendectomy   . Laparoscopic hysterectomy/ C-section    I have reviewed the patient's meds Current facility-administered medications:  1. 0.9 %  sodium chloride infusion, , Intravenous, Continuous, Bethany J Hunt, PA, Last Rate: 125 mL/hr at 02/22/11  2.  albuterol (PROVENTIL) (5 MG/ML) 0.5% nebulizer solution 5 mg, 5 mg, Nebulization, Once, Bethany J Hunt,  3.furosemide (LASIX) injection 40 mg, 40 mg, Intravenous, Once, [employer], PA, 40 mg at 02/22/11 0858 4. ipratropium (ATROVENT) nebulizer solution 0.5 mg, 0.5 mg, Nebulization, Once, [employer], PA, 0.5 mg  Current outpatient prescriptions:aspirin  1. EC 81 MG tablet, Take 81 mg by mouth daily.  , Disp: , Rfl: ;  2. carvedilol (COREG) 25 MG tablet, Take 25 mg by mouth 2 (two) times daily with a meal.  , Disp: , Rfl: ;  3. ferrous sulfate 325 (65 FE) MG tablet, Take 325 mg by mouth every other day.  , Disp: , Rfl: ;  4. FLUoxetine (PROZAC) 20 MG capsule, Take  20 mg by mouth daily at 12 noon.  , Disp: , Rfl:  5.furosemide (LASIX) 40 MG tablet, Take 40 mg by mouth daily.  , Disp: , Rfl: ;  6. levothyroxine (SYNTHROID, LEVOTHROID) 75 MCG tablet, Take 75 mcg by mouth daily.  , Disp: , Rfl: ;  7. LORazepam (ATIVAN) 2 MG tablet, Take 2 mg by mouth every 8 (eight) hours as needed. anxiety , Disp: , Rfl: ;  8. Multiple Vitamins-Minerals (ICAPS PO), Take 1 tablet by mouth daily.  , Disp: , Rfl:  9. OLANZapine (ZYPREXA) 5 MG tablet, Take 5 mg  by mouth daily at 12 noon.  , Disp: , Rfl: ;  10. pantoprazole (PROTONIX) 40 MG tablet, Take 40 mg by mouth daily. 1 hour before breakfast , Disp: , Rfl: ;  11. potassium chloride SA (K-DUR,KLOR-CON) 20 MEQ tablet, Take 20 mEq by mouth daily.  , Disp: , Rfl: ;  12. telmisartan-hydrochlorothiazide (MICARDIS HCT) 80-12.5 MG per tablet, Take 1 tablet by mouth daily.  , Disp: , Rfl:  13. traMADol (ULTRAM) 50 MG tablet, Take 50 mg by mouth every 6 (six) hours as needed. Pain  , Disp: , Rfl:   Allergies:  Allergies  Allergen Reactions  . Penicillins Swelling and Rash    History   Social History  . Marital Status: Married    Spouse Name: N/A    Number of Children: N/A  . Years of Education: N/A   Occupational History  . Retired from [employer]   Social History Main Topics  . Smoking status: No   . Smokeless tobacco: No   . Alcohol Use: No  . Drug Use: No  . Sexually Active:    Social History Narrative  . No narrative on file     Family history: Mother and father both passed from coronary artery  disease.  Mother at the age of 77.  Father died of a CVA at age 67. One brother and one sister are alive with a brother had lymphoma at age 44 and a sister having a previous cardiac stent.    Physical Exam: Blood pressure 141/73, pulse 80, temperature 98.4 F (36.9 C), temperature source Oral, resp. rate 22, SpO2 94.00%. General: Well developed, obese white female, who is short of per. Head: Normocephalic, atraumatic, sclera non-icteric, no xanthomas, nares are without discharge.  Neck: Negative for carotid bruits. JVD elevated to 12 cm. No thyromegally Lungs: Bilateral wheezes, rales, few rhonchi. Breathing is mildly labored. Heart: RRR with S1 S2. Faint murmurs, no rubs, or gallops appreciated. Abdomen: Soft, non-tender, non-distended with normoactive bowel sounds. No hepatomegaly. No rebound/guarding. No obvious abdominal masses. Msk:  She is weak generally. There is no unilateral  weakness, she ambulates poorly because of musculoskeletal issues Extremities: No clubbing or cyanosis. 2+ edema.  Distal pedal pulses are 2+ and equal bilaterally. Neuro: Alert and oriented X 3. Moves all extremities spontaneously. No focal deficits noted. Psych:  Responds to questions appropriately with a normal affect. Skin: No rashes or lesions noted  Review of Systems:  All other systems reviewed and are otherwise negative except as noted above.  Labs:   Lab Results  Component Value Date   WBC 8.0 02/22/2011   HGB 11.0* 02/22/2011   HCT 35.4* 02/22/2011   MCV 98.6 02/22/2011   PLT 272 02/22/2011    Lab 02/22/11 0632  NA 143  K 3.9  CL 102  CO2 28  BUN 22  CREATININE 1.27*  CALCIUM 9.8  PROT --  BILITOT --  ALKPHOS --  ALT --  AST --  GLUCOSE 114*   No results found for this basename: CKTOTAL:4,CKMB:4,TROPONINI:4 in the last 72 hours No results found for this basename: CHOL, HDL, LDLCALC, TRIG   No results found for this basename: DDIMER    Radiology/Studies:  Dg Chest 2 View 02/22/2011  *RADIOLOGY REPORT*  Clinical Data: Cough and shortness of breath, fever  CHEST - 2 VIEW  Comparison: July 30, 2008  Findings: The cardiac silhouette, mediastinum, pulmonary vasculature are within normal limits.  Both lungs are clear. There is no acute bony abnormality.   IMPRESSION: There is no evidence of acute cardiac or pulmonary process.  Original Report Authenticated By: Brandon Melnick, M.D.     EKG: Sinus rhythm, rate 79, no acute ischemic changes and Q waves only in lead 3  ASSESSMENT AND PLAN:  1. CHF: Acute diastolic CHF - lasix 40 IV BID, follow weights and I/Os. Continue foley for now.   2. Bronchitis - prn Rx and Zpack  3. Deconditioning - PT/OT to see once her respiratory status improves.  4. ?CAD - cycle enzymes and check echo.  Signed, Bjorn Loser Barrett PA-C 02/22/2011, 9:05 AM  I have seen, examined the patient, and reviewed the above assessment and plan. Briefly, 78 yo  female with obesity and diastolic dysfunction now presents with progressive cough and SOB.  She has stable BLE edema.  On exam, she has very coarse BS, elevated JVP, and BLE edema.  CXR is surprisingly clear. Will admit for treatment of acute bronchitis and acute on chronic diastolic dysfunction. Repeat echo to make sure EF has not changed. Plan to DC in 1-2 days once SOB is improved.  If she does not improve with the above treatment, then further evaluation may be required.  Co Sign: Hillis Range, MD 02/22/2011 10:12 AM

## 2011-02-22 NOTE — ED Provider Notes (Signed)
History     CSN: [id number]  Arrival date & time 02/22/11  0502   First MD Initiated Contact with Patient 02/22/11 [phone]      Chief Complaint  Patient presents with  . Cough    pt c/o cough since monday.   . Fever    (Consider location/radiation/quality/duration/timing/severity/associated sxs/prior treatment) HPI  Patient who states she has a hx of CHF that she states was managed by Dr. Deborah Chalk at [facility] before he left the group with hx of intermittent PND and orthopnea x 4 years presents to the ER with husband at bedside complaining of a 1 week hx of increasing SOB, productive cough, feeling chilled and feverish though no known recorded temperature and states that she woke last night suddenly with increased SOB and therefore presented to the ER. Patient states despite being on lasix, increasing lower extremity swelling. Patient is followed by Dr. Gildardo Cranker, PCP, and states she has not seen him for current complaint because she initially assumed she had a virus or the flu and but that she "just is not getting better." Patient denies associated CP, n/v, abdominal pain. States hx of bronchitis in the past but no daily inhaler use. Patient states symptoms are aggravated by exertion and mildly improved by rest. Patient has listed CAD in PMH but she denies known hx of MI.   Past Medical History  Diagnosis Date  . Hypertension   . CHF (congestive heart failure)   . Coronary artery disease   . GERD (gastroesophageal reflux disease)     No past surgical history on file.  No family history on file.  History  Substance Use Topics  . Smoking status: Not on file  . Smokeless tobacco: Not on file  . Alcohol Use:     OB History    Grav Para Term Preterm Abortions TAB SAB Ect Mult Living                  Review of Systems  All other systems reviewed and are negative.    Allergies  Penicillins  Home Medications   Current Outpatient Rx  Name Route Sig Dispense  Refill  . ASPIRIN EC 81 MG PO TBEC Oral Take 81 mg by mouth daily.      Marland Kitchen CARVEDILOL 25 MG PO TABS Oral Take 25 mg by mouth 2 (two) times daily with a meal.      . FERROUS SULFATE 325 (65 FE) MG PO TABS Oral Take 325 mg by mouth every other day.      Marland Kitchen FLUOXETINE HCL 20 MG PO CAPS Oral Take 20 mg by mouth daily at 12 noon.      . FUROSEMIDE 40 MG PO TABS Oral Take 40 mg by mouth daily.      Marland Kitchen LEVOTHYROXINE SODIUM 75 MCG PO TABS Oral Take 75 mcg by mouth daily.      Marland Kitchen LORAZEPAM 2 MG PO TABS Oral Take 2 mg by mouth every 8 (eight) hours as needed. anxiety     . ICAPS PO Oral Take 1 tablet by mouth daily.      Marland Kitchen OLANZAPINE 5 MG PO TABS Oral Take 5 mg by mouth daily at 12 noon.      Marland Kitchen PANTOPRAZOLE SODIUM 40 MG PO TBEC Oral Take 40 mg by mouth daily. 1 hour before breakfast     . POTASSIUM CHLORIDE CRYS ER 20 MEQ PO TBCR Oral Take 20 mEq by mouth daily.      Marland Kitchen  TELMISARTAN-HCTZ 80-12.5 MG PO TABS Oral Take 1 tablet by mouth daily.      . TRAMADOL HCL 50 MG PO TABS Oral Take 50 mg by mouth every 6 (six) hours as needed. Pain        BP 160/74  Pulse 103  Temp(Src) 98.4 F (36.9 C) (Oral)  Resp 18  SpO2 95%  Physical Exam  Vitals reviewed. Constitutional: She is oriented to person, place, and time. She appears well-developed and well-nourished. No distress.  HENT:  Head: Normocephalic and atraumatic.  Right Ear: External ear normal.  Left Ear: External ear normal.  Nose: Nose normal.  Mouth/Throat: No oropharyngeal exudate.       Mild erythema of posterior pharynx and tonsils no tonsillar exudate or enlargement. Patent airway. Swallowing secretions well  Eyes: Conjunctivae and EOM are normal. Pupils are equal, round, and reactive to light.  Neck: Normal range of motion. Neck supple. No JVD present.  Cardiovascular: Normal rate, regular rhythm and normal heart sounds.  Exam reveals no gallop and no friction rub.   No murmur heard. Pulmonary/Chest: Effort normal. No respiratory distress.  She has wheezes. She has rales. She exhibits no tenderness.       Diffuse faint exp wheezing and bibasilar rales. Mid lung field rhonchi. No resp distress.   Abdominal: Soft. She exhibits no distension and no mass. There is no tenderness. There is no rebound and no guarding.  Musculoskeletal: She exhibits edema.       Bilateral 2+ pitting edema of lower extremities. No erythema or breaks in skin. Good pedal pulses bilaterally.   Lymphadenopathy:    She has no cervical adenopathy.  Neurological: She is alert and oriented to person, place, and time. She has normal reflexes.  Skin: Skin is warm and dry. No rash noted. She is not diaphoretic.  Psychiatric: She has a normal mood and affect.    ED Course  Procedures (including critical care time)  Neb albuterol/atrovent.   Date: 02/22/2011  Rate: 79  Rhythm: normal sinus rhythm  QRS Axis: normal  Intervals: normal  ST/T Wave abnormalities: normal  Conduction Disutrbances:none  Narrative Interpretation:   Old EKG Reviewed: no significant changes compared to Jul 30, 2008  8:30 AM Patient was able to ambulate keeping her sats above 94% on room air however is subjectively short of breath. When she is lying in the bed and sits up to converse she will briefly drop her sats to 89-90% however if she lays back and quit speaking sats quickly return to 95% on room air. Patient states the breathing treatment did not improve shortness of breath. Given her ongoing shortness of breath and elevated pro BNP in light of her history of congestive heart failure I spoke with [facility] cardiology who will consult on patient in ER. In the meantime we'll give 40 mg of IV Lasix and continue to monitor   Labs Reviewed  CBC - Abnormal; Notable for the following:    RBC 3.59 (*)    Hemoglobin 11.0 (*)    HCT 35.4 (*)    All other components within normal limits  DIFFERENTIAL - Abnormal; Notable for the following:    Eosinophils Relative 8 (*)    All other  components within normal limits  BASIC METABOLIC PANEL - Abnormal; Notable for the following:    Glucose, Bld 114 (*)    Creatinine, Ser 1.27 (*)    GFR calc non Af Amer 38 (*)    GFR calc Af Amer 45 (*)  All other components within normal limits  PRO B NATRIURETIC PEPTIDE - Abnormal; Notable for the following:    Pro B Natriuretic peptide (BNP) 689.9 (*)    All other components within normal limits   Dg Chest 2 View  02/22/2011  *RADIOLOGY REPORT*  Clinical Data: Cough and shortness of breath, fever  CHEST - 2 VIEW  Comparison: July 30, 2008  Findings: The cardiac silhouette, mediastinum, pulmonary vasculature are within normal limits.  Both lungs are clear. There is no acute bony abnormality.  IMPRESSION: There is no evidence of acute cardiac or pulmonary process.  Original Report Authenticated By: Brandon Melnick, M.D.     1. Acute bronchitis   2. Acute on chronic diastolic heart failure       MDM  [place] cardiology is going to admit patient for acute bronchitis with acute on chronic diastolic heart failure. Patient's vital signs are stable. She remains afebrile.        Jenness Corner, [place] 02/22/11 1103   Medical screening examination/treatment/procedure(s) were conducted as a shared visit with non-physician practitioner(s) and myself.  I personally evaluated the patient during the encounter. I evaluated patient bedside. She's having progressive shortness of breath at rest and with any exertion. Room air pulse ox mid 90s drops to the 80s with sitting up in a type of exertion. On exam her breath sounds have some mild rails mildly decreased breath sounds no respiratory distress. She does get to keep take with sitting up and exertion. She does have symmetric lower extremity edema and exam concerning for CHF exacerbation. Chest x-ray labs reviewed and although not significantly elevated BNP, cardiology was consult in for evaluation and recommendations. Plan cardiology admit  Sunnie Nielsen, MD 02/22/11 2332

## 2011-02-22 NOTE — ED Notes (Signed)
Pt back to the stretcher at this time; ambulated well

## 2011-02-22 NOTE — ED Notes (Signed)
Pt c/o cough and fever since Monday. Pt c/o nasal congestion.

## 2011-02-23 DIAGNOSIS — I369 Nonrheumatic tricuspid valve disorder, unspecified: Secondary | ICD-10-CM

## 2011-02-23 LAB — BASIC METABOLIC PANEL
CO2: 27 mEq/L (ref 19–32)
Calcium: 9.8 mg/dL (ref 8.4–10.5)
Chloride: 100 mEq/L (ref 96–112)
Creatinine, Ser: 1.21 mg/dL — ABNORMAL HIGH (ref 0.50–1.10)
GFR calc Af Amer: 47 mL/min — ABNORMAL LOW (ref >90)
Sodium: 139 mEq/L (ref 135–145)

## 2011-02-23 LAB — CARDIAC PANEL(CRET KIN+CKTOT+MB+TROPI): Relative Index: 0.7 (ref 0.0–2.5)

## 2011-02-23 MED ORDER — DORZOLAMIDE HCL 2 % OP SOLN
1.0000 [drp] | Freq: Two times a day (BID) | OPHTHALMIC | Status: DC
  Administered 2011-02-23 – 2011-03-04 (×18): 1 [drp] via OPHTHALMIC
  Filled 2011-02-23 (×2): qty 10

## 2011-02-23 NOTE — Progress Notes (Signed)
SUBJECTIVE: The patient is doing well today.     Marland Kitchen aspirin EC  81 mg Oral Daily  . azithromycin  500 mg Oral Daily   Followed by  . azithromycin  250 mg Oral Daily  . benzonatate  200 mg Oral TID  . carvedilol  25 mg Oral BID WC  . enoxaparin  40 mg Subcutaneous QHS  . ferrous sulfate  325 mg Oral QODAY  . FLUoxetine  20 mg Oral Q1200  . furosemide  40 mg Intravenous Once  . furosemide  40 mg Intravenous BID  . levothyroxine  75 mcg Oral Daily  . multivitamin  1 tablet Oral Daily  . OLANZapine  5 mg Oral Q1200  . olmesartan  20 mg Oral Daily  . pantoprazole  40 mg Oral Daily  . pneumococcal 23 valent vaccine  0.5 mL Intramuscular Tomorrow-1000  . potassium chloride SA  20 mEq Oral BID  . sodium chloride  3 mL Intravenous Q12H  . DISCONTD: ICAPS  1 capsule Per post-pyloric tube Daily      . DISCONTD: sodium chloride 125 mL/hr at 02/22/11 1511    OBJECTIVE: Physical Exam: Filed Vitals:   02/22/11 2007 02/22/11 2300 02/22/11 2337 02/23/11 0355  BP: 114/43  147/88 122/46  Pulse: 93 100 82 103  Temp: 98.6 F (37 C)  99 F (37.2 C) 99.2 F (37.3 C)  TempSrc: Oral  Oral Oral  Resp: 32  16 20  Height:      Weight:    239 lb 6.7 oz (108.6 kg)  SpO2: 96% 94% 97% 90%    Intake/Output Summary (Last 24 hours) at 02/23/11 0810 Last data filed at 02/23/11 0600  Gross per 24 hour  Intake 1496.25 ml  Output   2625 ml  Net -1128.75 ml    Telemetry reveals sinus rhythm  GEN- The patient is well appearing, alert and oriented x 3 today.   Head- normocephalic, atraumatic Eyes-  Sclera clear, conjunctiva pink Ears- hearing intact Oropharynx- clear Neck- supple, JVP 10 Lymph- no cervical lymphadenopathy Lungs- very coarse anterior BS, normal WOB Heart- Regular rate and rhythm, no murmurs, rubs or gallops, PMI not laterally displaced GI- soft, NT, ND, + BS Extremities- no clubbing, cyanosis, or edema Skin- no rash or lesion Psych- euthymic mood, full affect Neuro-  strength and sensation are intact  LABS: Basic Metabolic Panel:  Basename 02/23/11 0143 02/22/11 1604 02/22/11 0632  NA 139 -- 143  K 4.0 -- 3.9  CL 100 -- 102  CO2 27 -- 28  GLUCOSE 109* -- 114*  BUN 16 -- 22  CREATININE 1.21* 1.24* --  CALCIUM 9.8 -- 9.8  MG -- -- --  PHOS -- -- --   Liver Function Tests: No results found for this basename: AST:2,ALT:2,ALKPHOS:2,BILITOT:2,PROT:2,ALBUMIN:2 in the last 72 hours No results found for this basename: LIPASE:2,AMYLASE:2 in the last 72 hours CBC:  Basename 02/22/11 1604 02/22/11 0632  WBC 8.9 8.0  NEUTROABS -- 4.8  HGB 11.0* 11.0*  HCT 35.3* 35.4*  MCV 97.8 98.6  PLT 276 272   Cardiac Enzymes:  Basename 02/23/11 0143 02/22/11 1841 02/22/11 1604  CKTOTAL 587* 334* 225*  CKMB 3.9 3.2 2.7  CKMBINDEX -- -- --  TROPONINI <0.30 <0.30 <0.30   BNP: No components found with this basename: POCBNP:3 D-Dimer: No results found for this basename: DDIMER:2 in the last 72 hours Hemoglobin A1C:  Basename 02/22/11 1604  HGBA1C 6.0*   Fasting Lipid Panel: No results found for this basename:  CHOL,HDL,LDLCALC,TRIG,CHOLHDL,LDLDIRECT in the last 72 hours Thyroid Function Tests: No results found for this basename: TSH,T4TOTAL,FREET3,T3FREE,THYROIDAB in the last 72 hours Anemia Panel: No results found for this basename: VITAMINB12,FOLATE,FERRITIN,TIBC,IRON,RETICCTPCT in the last 72 hours  RADIOLOGY: Dg Chest 2 View  02/22/2011  *RADIOLOGY REPORT*  Clinical Data: Cough and shortness of breath, fever  CHEST - 2 VIEW  Comparison: July 30, 2008  Findings: The cardiac silhouette, mediastinum, pulmonary vasculature are within normal limits.  Both lungs are clear. There is no acute bony abnormality.  IMPRESSION: There is no evidence of acute cardiac or pulmonary process.  Original Report Authenticated By: Brandon Melnick, M.D.    ASSESSMENT AND PLAN:  Active Problems:  Acute bronchitis  Acute on chronic diastolic heart failure   Briefly, 78  yo female with obesity and diastolic dysfunction now presents with progressive cough and SOB. She has stable BLE edema.  Exam is most consistent with acute bronchitis and secondary volume overload  1. Acute bronchitis- continue azithromycin 2. Acute on chronic diastolic dysfunction Continue diuresis Awaiting echo  Will continue to require inpatient management   Hillis Range, MD 02/23/2011 8:10 AM

## 2011-02-24 DIAGNOSIS — J209 Acute bronchitis, unspecified: Secondary | ICD-10-CM

## 2011-02-24 LAB — CBC
Hemoglobin: 10.8 g/dL — ABNORMAL LOW (ref 12.0–15.0)
MCH: 30.9 pg (ref 26.0–34.0)
Platelets: 245 10*3/uL (ref 150–400)
RBC: 3.5 MIL/uL — ABNORMAL LOW (ref 3.87–5.11)
WBC: 8.9 10*3/uL (ref 4.0–10.5)

## 2011-02-24 LAB — BASIC METABOLIC PANEL
Calcium: 9.7 mg/dL (ref 8.4–10.5)
GFR calc non Af Amer: 39 mL/min — ABNORMAL LOW (ref >90)
Glucose, Bld: 114 mg/dL — ABNORMAL HIGH (ref 70–99)
Potassium: 4.4 mEq/L (ref 3.5–5.1)
Sodium: 140 mEq/L (ref 135–145)

## 2011-02-24 MED ORDER — FUROSEMIDE 10 MG/ML IJ SOLN
40.0000 mg | Freq: Once | INTRAMUSCULAR | Status: DC
  Filled 2011-02-24: qty 4

## 2011-02-24 MED ORDER — GUAIFENESIN ER 600 MG PO TB12
600.0000 mg | ORAL_TABLET | Freq: Two times a day (BID) | ORAL | Status: DC
  Administered 2011-02-24 – 2011-03-04 (×17): 600 mg via ORAL
  Filled 2011-02-24 (×19): qty 1

## 2011-02-24 MED ORDER — FUROSEMIDE 10 MG/ML IJ SOLN
40.0000 mg | Freq: Every day | INTRAMUSCULAR | Status: DC
  Administered 2011-02-25: 40 mg via INTRAVENOUS
  Filled 2011-02-24: qty 4

## 2011-02-24 NOTE — Progress Notes (Signed)
Patient ID: Debra Johnston, female   DOB: Jan 15, 1928, 78 y.o.   MRN: [id number] SUBJECTIVE:Patient is improving. She continues to cough.. She is not having any chest pain.   Filed Vitals:   02/23/11 2330 02/24/11 0300 02/24/11 0733 02/24/11 0745  BP: 145/43 154/67  151/62  Pulse: 78 84  76  Temp: 99 F (37.2 C) 98 F (36.7 C)  98.3 F (36.8 C)  TempSrc: Oral Oral  Oral  Resp: 27 18  17   Height:      Weight:   233 lb 4 oz (105.8 kg)   SpO2: 96% 95%  97%    Intake/Output Summary (Last 24 hours) at 02/24/11 0839 Last data filed at 02/24/11 0746  Gross per 24 hour  Intake      3 ml  Output   3702 ml  Net  -3699 ml    LABS: Basic Metabolic Panel:  Basename 02/24/11 0530 02/23/11 0143  NA 140 139  K 4.4 4.0  CL 101 100  CO2 31 27  GLUCOSE 114* 109*  BUN 18 16  CREATININE 1.26* 1.21*  CALCIUM 9.7 9.8  MG -- --  PHOS -- --   Liver Function Tests: No results found for this basename: AST:2,ALT:2,ALKPHOS:2,BILITOT:2,PROT:2,ALBUMIN:2 in the last 72 hours No results found for this basename: LIPASE:2,AMYLASE:2 in the last 72 hours CBC:  Basename 02/24/11 0530 02/22/11 1604 02/22/11 0632  WBC 8.9 8.9 --  NEUTROABS -- -- 4.8  HGB 10.8* 11.0* --  HCT 34.7* 35.3* --  MCV 99.1 97.8 --  PLT 245 276 --   Cardiac Enzymes:  Basename 02/23/11 0143 02/22/11 1841 02/22/11 1604  CKTOTAL 587* 334* 225*  CKMB 3.9 3.2 2.7  CKMBINDEX -- -- --  TROPONINI <0.30 <0.30 <0.30   BNP: No components found with this basename: POCBNP:3 D-Dimer: No results found for this basename: DDIMER:2 in the last 72 hours Hemoglobin A1C:  Basename 02/22/11 1604  HGBA1C 6.0*   Fasting Lipid Panel: No results found for this basename: CHOL,HDL,LDLCALC,TRIG,CHOLHDL,LDLDIRECT in the last 72 hours Thyroid Function Tests: No results found for this basename: TSH,T4TOTAL,FREET3,T3FREE,THYROIDAB in the last 72 hours  RADIOLOGY: Dg Chest 2 View  02/22/2011  *RADIOLOGY REPORT*  Clinical Data: Cough and  shortness of breath, fever  CHEST - 2 VIEW  Comparison: July 30, 2008  Findings: The cardiac silhouette, mediastinum, pulmonary vasculature are within normal limits.  Both lungs are clear. There is no acute bony abnormality.  IMPRESSION: There is no evidence of acute cardiac or pulmonary process.  Original Report Authenticated By: Brandon Melnick, M.D.    PHYSICAL EXAM Patient is oriented to person time and place. Affect is normal. She is lying flat in bed. There is no jugular venous distention. Lungs reveal diffuse rhonchi. Cardiac exam reveals an S1 and S2. There no clicks or significant murmurs. The abdomen is soft. There is no significant peripheral edema. Abdomen is soft.   TELEMETRY: I have reviewed to telemetry. There is normal sinus rhythm.    ASSESSMENT AND PLAN:  Active Problems:   Acute bronchitis Patient continues to have a significant cough. She is receiving all appropriate medications.    Acute on chronic diastolic heart failure Two-dimensional echo was done yesterday revealing ejection fraction of 60%. There was suggestion of increased filling pressure. Pulmonary systolic pressure was 32 mmHg. Her original chest x-ray did not show marked volume overload. She has diuresis well already. Renal function is stable and potassium is stable. I will cut her diuretic dose back to  once daily and DC her Foley and follow her renal function.  Willa Rough 02/24/2011 8:39 AM

## 2011-02-25 DIAGNOSIS — N289 Disorder of kidney and ureter, unspecified: Secondary | ICD-10-CM

## 2011-02-25 DIAGNOSIS — R05 Cough: Secondary | ICD-10-CM

## 2011-02-25 DIAGNOSIS — I5031 Acute diastolic (congestive) heart failure: Secondary | ICD-10-CM

## 2011-02-25 DIAGNOSIS — J209 Acute bronchitis, unspecified: Secondary | ICD-10-CM

## 2011-02-25 LAB — BASIC METABOLIC PANEL
CO2: 30 mEq/L (ref 19–32)
Chloride: 100 mEq/L (ref 96–112)
Glucose, Bld: 115 mg/dL — ABNORMAL HIGH (ref 70–99)
Potassium: 4.2 mEq/L (ref 3.5–5.1)
Sodium: 140 mEq/L (ref 135–145)

## 2011-02-25 MED ORDER — ALBUTEROL SULFATE (5 MG/ML) 0.5% IN NEBU
2.5000 mg | INHALATION_SOLUTION | Freq: Four times a day (QID) | RESPIRATORY_TRACT | Status: DC
  Administered 2011-02-25 – 2011-02-26 (×3): 2.5 mg via RESPIRATORY_TRACT
  Filled 2011-02-25 (×3): qty 0.5

## 2011-02-25 MED ORDER — IPRATROPIUM BROMIDE 0.02 % IN SOLN
0.5000 mg | Freq: Four times a day (QID) | RESPIRATORY_TRACT | Status: DC
  Administered 2011-02-25 – 2011-02-26 (×3): 0.5 mg via RESPIRATORY_TRACT
  Filled 2011-02-25 (×3): qty 2.5

## 2011-02-25 MED ORDER — ALBUTEROL SULFATE (5 MG/ML) 0.5% IN NEBU
2.5000 mg | INHALATION_SOLUTION | RESPIRATORY_TRACT | Status: DC | PRN
  Administered 2011-02-26 – 2011-03-01 (×2): 2.5 mg via RESPIRATORY_TRACT
  Filled 2011-02-25 (×2): qty 0.5

## 2011-02-25 MED ORDER — PREDNISONE 20 MG PO TABS
20.0000 mg | ORAL_TABLET | Freq: Two times a day (BID) | ORAL | Status: AC
  Administered 2011-02-25 – 2011-02-28 (×6): 20 mg via ORAL
  Filled 2011-02-25 (×7): qty 1

## 2011-02-25 NOTE — Progress Notes (Signed)
Patient ID: Debra Johnston, female   DOB: Nov 25, 1927, 78 y.o.   MRN: [id number] SUBJECTIVE: The patient is definitely feeling better today although she continues to have a significant cough.  She his diuresis further. She is beginning to have some increase in her BUN and creatinine.   Filed Vitals:   02/25/11 0000 02/25/11 0400 02/25/11 0500 02/25/11 0751  BP: 152/51 134/63  132/55  Pulse: 90 75  74  Temp: 98.5 F (36.9 C) 98.4 F (36.9 C)  98.2 F (36.8 C)  TempSrc: Oral Oral  Oral  Resp: 28 28  23   Height:      Weight:  233 lb 7.5 oz (105.9 kg) 233 lb 7.5 oz (105.9 kg)   SpO2: 94% 97%  95%    Intake/Output Summary (Last 24 hours) at 02/25/11 1035 Last data filed at 02/25/11 0914  Gross per 24 hour  Intake    480 ml  Output    977 ml  Net   -497 ml    LABS: Basic Metabolic Panel:  Basename 02/25/11 0440 02/24/11 0530  NA 140 140  K 4.2 4.4  CL 100 101  CO2 30 31  GLUCOSE 115* 114*  BUN 25* 18  CREATININE 1.43* 1.26*  CALCIUM 9.5 9.7  MG -- --  PHOS -- --   Liver Function Tests: No results found for this basename: AST:2,ALT:2,ALKPHOS:2,BILITOT:2,PROT:2,ALBUMIN:2 in the last 72 hours No results found for this basename: LIPASE:2,AMYLASE:2 in the last 72 hours CBC:  Basename 02/24/11 0530 02/22/11 1604  WBC 8.9 8.9  NEUTROABS -- --  HGB 10.8* 11.0*  HCT 34.7* 35.3*  MCV 99.1 97.8  PLT 245 276   Cardiac Enzymes:  Basename 02/23/11 0143 02/22/11 1841 02/22/11 1604  CKTOTAL 587* 334* 225*  CKMB 3.9 3.2 2.7  CKMBINDEX -- -- --  TROPONINI <0.30 <0.30 <0.30   BNP: No components found with this basename: POCBNP:3 D-Dimer: No results found for this basename: DDIMER:2 in the last 72 hours Hemoglobin A1C:  Basename 02/22/11 1604  HGBA1C 6.0*   Fasting Lipid Panel: No results found for this basename: CHOL,HDL,LDLCALC,TRIG,CHOLHDL,LDLDIRECT in the last 72 hours Thyroid Function Tests: No results found for this basename: TSH,T4TOTAL,FREET3,T3FREE,THYROIDAB in  the last 72 hours  RADIOLOGY: Dg Chest 2 View  02/22/2011  *RADIOLOGY REPORT*  Clinical Data: Cough and shortness of breath, fever  CHEST - 2 VIEW  Comparison: July 30, 2008  Findings: The cardiac silhouette, mediastinum, pulmonary vasculature are within normal limits.  Both lungs are clear. There is no acute bony abnormality.  IMPRESSION: There is no evidence of acute cardiac or pulmonary process.  Original Report Authenticated By: Brandon Melnick, M.D.    PHYSICAL EXAM    Patient is oriented to person time and place. Affect is normal. There is no jugulovenous distention. Lungs reveal diffuse rhonchi and some scattered wheezing. I do not hear rales. Cardiac exam reveals S1 and S2. There no clicks or significant murmurs. The abdomen is soft. There is no peripheral edema.   TELEMETRY: I have reviewed telemetry. There is normal sinus rhythm.   ASSESSMENT AND PLAN:  Active Problems:   Acute bronchitis The patient has continued symptoms with cough and wheezing. I have asked our pulmonary team to see her in consultation to help further.   Acute on chronic diastolic heart failure   The patient has diaries. I believe her volume status is stable. There is some increase in her creatinine. I have put her diuretic on hold.   Renal insufficiency  Creatinine has gone up slightly to 1.4. I put her diuretic on hold. Chemistry will be checked again tomorrow morning.    Willa Rough 02/25/2011 10:35 AM

## 2011-02-25 NOTE — Consults (Signed)
Reason for Consult: Bronchitis and cough Referring Physician: Dr Alden Hipp is an 78 y.o. female.  HPI: HPI: Shemaiah R Deriso is an 78 year old female with a history of chronic diastolic CHF. She has been managed by Dr. Duane Lope. He had previously increased her diuretics with improvement in her symptoms.  Over the last 3 weeks, Ms. Spilker developed increasing dyspnea on exertion. She also describes orthopnea and PND. She had increasing lower extremity edema. She has not had chest pain. She has not had palpitations. She admits that when she exerts herself sometimes she gets so short of breath she gets a little light-headed. Her symptoms have progressed despite compliance with her medications but was unable to increase her Lasix to 80 mg daily as to record because of the frequent urination it caused. She has not seen Dr. Tenny Craw during this time.  4 days ago, she began coughing. The cough was nonproductive. She thinks she might have been wheezing a little as well. She doesn't think she had a fever. Today, her symptoms progressed to the point that she was significantly short of breath at rest so she came to the emergency room. Initially, in the emergency room, her O2 saturation was approximately 85%. She was placed on O2. She has had a nebulizer breathing treatment and IV Lasix. She does not think her respiratory status is much improved, although her O2 saturation is 95% on O2.  Never smoker with no prior hx of lung disease. She denies recent sense that she had a cold, or any sick exposure. Has not recognized reflux, choking at meals or from meds. Prior knee surgery, but no new leg discomfort and no hx of VTE. No significant nasal symptoms or postnasal drip. Cough is nonproductive, causing stress incontinence. Is on beta blocker carvedilol, but no ACE inhibitors. Benzonatate has not helped cough.   Past Medical History  Diagnosis Date  . Hypertension   . CHF (congestive heart failure)   .  Coronary artery disease   . GERD (gastroesophageal reflux disease)   . Glaucoma   . Osteoarthritis   . Fibromyalgia   . Hyperlipidemia   . Shortness of breath   . Hypothyroidism   . Anxiety   . Depression   . Bronchitis 02/22/2011    Past Surgical History  Procedure Date  . Hemorrhoid surgery   . Appendectomy   . Laparoscopic hysterectomy   . Knee surgery     bilateral  . Cholecystectomy     pt denies having cholecystectomy    History reviewed. No pertinent family history.  Social History:  reports that she has never smoked. She has never used smokeless tobacco. She reports that she does not drink alcohol or use illicit drugs.  Allergies:  Allergies  Allergen Reactions  . Penicillins Swelling and Rash    Medications: I have reviewed the patient's current medications.  Results for orders placed during the hospital encounter of 02/22/11 (from the past 48 hour(s))  BASIC METABOLIC PANEL     Status: Abnormal   Collection Time   02/24/11  5:30 AM      Component Value Range Comment   Sodium 140  135 - 145 (mEq/L)    Potassium 4.4  3.5 - 5.1 (mEq/L)    Chloride 101  96 - 112 (mEq/L)    CO2 31  19 - 32 (mEq/L)    Glucose, Bld 114 (*) 70 - 99 (mg/dL)    BUN 18  6 - 23 (mg/dL)  Creatinine, Ser 1.26 (*) 0.50 - 1.10 (mg/dL)    Calcium 9.7  8.4 - 10.5 (mg/dL)    GFR calc non Af Amer 39 (*) >90 (mL/min)    GFR calc Af Amer 45 (*) >90 (mL/min)   CBC     Status: Abnormal   Collection Time   02/24/11  5:30 AM      Component Value Range Comment   WBC 8.9  4.0 - 10.5 (K/uL)    RBC 3.50 (*) 3.87 - 5.11 (MIL/uL)    Hemoglobin 10.8 (*) 12.0 - 15.0 (g/dL)    HCT 04.5 (*) 40.9 - 46.0 (%)    MCV 99.1  78.0 - 100.0 (fL)    MCH 30.9  26.0 - 34.0 (pg)    MCHC 31.1  30.0 - 36.0 (g/dL)    RDW 81.1  91.4 - 78.2 (%)    Platelets 245  150 - 400 (K/uL)   BASIC METABOLIC PANEL     Status: Abnormal   Collection Time   02/25/11  4:40 AM      Component Value Range Comment   Sodium 140  135  - 145 (mEq/L)    Potassium 4.2  3.5 - 5.1 (mEq/L)    Chloride 100  96 - 112 (mEq/L)    CO2 30  19 - 32 (mEq/L)    Glucose, Bld 115 (*) 70 - 99 (mg/dL)    BUN 25 (*) 6 - 23 (mg/dL)    Creatinine, Ser 9.56 (*) 0.50 - 1.10 (mg/dL)    Calcium 9.5  8.4 - 10.5 (mg/dL)    GFR calc non Af Amer 33 (*) >90 (mL/min)    GFR calc Af Amer 39 (*) >90 (mL/min)     No results found.  ROS:  Constitutional:   No-   weight loss, night sweats, fevers, chills, fatigue, lassitude. HEENT:   No-  headaches, difficulty swallowing, tooth/dental problems, sore throat,       No-  sneezing, itching, ear ache, nasal congestion, post nasal drip,  CV:  No-   chest pain, orthopnea, PND, swelling in lower extremities, anasarca,  dizziness, palpitations Resp: + shortness of breath with exertion or at rest.              No-   productive cough,  + non-productive cough,  No- coughing up of blood.              No-   change in color of mucus.  + wheezing.   Skin: No-   rash or lesions. GI:  No-   heartburn, indigestion, abdominal pain, nausea, vomiting, diarrhea,                 change in bowel habits, loss of appetite GU: No-   dysuria, change in color of urine, no urgency or frequency.  No- flank pain. MS:  No-   joint pain or swelling.  No- decreased range of motion.  No- back pain. Neuro-     nothing unusual Psych:  No- change in mood or affect. No depression or anxiety.  No memory loss.   PHYS EXAM: Blood pressure 111/47, pulse 86, temperature 98.4 F (36.9 C), temperature source Oral, resp. rate 21, height 5\' 10"  (1.778 m), weight 105.9 kg (233 lb 7.5 oz), SpO2 95.00%. General- Alert, Oriented, Affect-appropriate, Distress- none acute, obese, up in chair Skin- rash-none, lesions- none, excoriation- none Lymphadenopathy- none Head- atraumatic            Eyes- Gross vision intact,  PERRLA, conjunctivae clear secretions            Ears- Hearing, canals-normal            Nose- Clear, no-Septal dev, mucus, polyps,  erosion, perforation             Throat- Mallampati II , mucosa clear , drainage- none, tonsils- atrophic Neck- flexible , trachea midline, no stridor , thyroid nl, carotid no bruit Chest - symmetrical excursion , unlabored           Heart/CV- RRR , no murmur , no gallop  , no rub, nl s1 s2                           - JVD- none , edema- trace, stasis changes- none, varices- none           Lung- bilateral coarse wheeze, cough- with deep breath , dullness-none, rub- none           Chest wall-  Abd- tender-no, distended-no, bowel sounds-present, HSM- no Br/ Gen/ Rectal- Not done, not indicated Extrem- cyanosis- none, clubbing, none, atrophy- none, strength- nl Neuro- grossly intact to observation     Assessment/Plan: 1) Bronchitis with asthma- Likely a viral syndrome. Role of Coreg and some degree of CHF at onset is now hard to determine.     I would favor using selective beta adrenergic where possible. Will add bronchodilator and steroid taper .I will be happy to see her in office f/u if needed ( I see her husband). Potential DDX includes aspiration, PE, but don't get strong clues to either now. Plan- ordered neb and started prednisone.  Waymon Budge 02/25/2011, 2:18 PM

## 2011-02-26 LAB — BASIC METABOLIC PANEL
BUN: 24 mg/dL — ABNORMAL HIGH (ref 6–23)
CO2: 24 mEq/L (ref 19–32)
Chloride: 101 mEq/L (ref 96–112)
GFR calc non Af Amer: 48 mL/min — ABNORMAL LOW (ref >90)
Glucose, Bld: 125 mg/dL — ABNORMAL HIGH (ref 70–99)
Potassium: 5.3 mEq/L — ABNORMAL HIGH (ref 3.5–5.1)
Sodium: 137 mEq/L (ref 135–145)

## 2011-02-26 MED ORDER — IPRATROPIUM-ALBUTEROL 18-103 MCG/ACT IN AERO
2.0000 | INHALATION_SPRAY | Freq: Four times a day (QID) | RESPIRATORY_TRACT | Status: DC
  Administered 2011-02-26 – 2011-02-28 (×7): 2 via RESPIRATORY_TRACT
  Filled 2011-02-26 (×2): qty 14.7

## 2011-02-26 MED ORDER — OLMESARTAN MEDOXOMIL 40 MG PO TABS
40.0000 mg | ORAL_TABLET | Freq: Every day | ORAL | Status: DC
  Administered 2011-02-26 – 2011-03-04 (×7): 40 mg via ORAL
  Filled 2011-02-26 (×7): qty 1

## 2011-02-26 NOTE — Progress Notes (Signed)
Patient: Debra Johnston DOB: 1927/05/15 Date of Admission: 02/22/2011            Pulmonary follow up note    HPI - 78 yo female with hx chronic diastolic CHF who presented 1/3 with 3 week hx increased DOE, orthopnea, PND and increased BLE swelling. She was admitted by cardiology with acute on chronic CHF.  On 1/6 developed worsening SOB, cough and hypoxia and PCCM consulted for bronchitis and cough.    Subjective--  Feels much better this am.  Still has cough, unable to "bring phlegm all the way up".    Filed Vitals:   02/26/11 0010 02/26/11 0400 02/26/11 0739 02/26/11 0810  BP: 170/75 153/63 167/74   Pulse: 96  80   Temp: 98.5 F (36.9 C) 97.8 F (36.6 C) 97.8 F (36.6 C)   TempSrc: Oral Oral Oral   Resp: 24  17   Height:      Weight:  232 lb 2.3 oz (105.3 kg)    SpO2: 93%  93% 99%    chest X-ray No new CXR    CBC    Component Value Date/Time   WBC 8.9 02/24/2011 0530   RBC 3.50* 02/24/2011 0530   HGB 10.8* 02/24/2011 0530   HCT 34.7* 02/24/2011 0530   PLT 245 02/24/2011 0530   MCV 99.1 02/24/2011 0530   MCH 30.9 02/24/2011 0530   MCHC 31.1 02/24/2011 0530   RDW 14.6 02/24/2011 0530   LYMPHSABS 1.6 02/22/2011 0632   MONOABS 0.9 02/22/2011 0632   EOSABS 0.6 02/22/2011 0632   BASOSABS 0.1 02/22/2011 0632     BMET    Component Value Date/Time   NA 137 02/26/2011 0505   K 5.3* 02/26/2011 0505   CL 101 02/26/2011 0505   CO2 24 02/26/2011 0505   GLUCOSE 125* 02/26/2011 0505   BUN 24* 02/26/2011 0505   CREATININE 1.05 02/26/2011 0505   CALCIUM 9.9 02/26/2011 0505   GFRNONAA 48* 02/26/2011 0505   GFRAA 56* 02/26/2011 0505      EXAM: General: obese female, NAD OOB in chair Neuro: awake and alert, MAE CV:  s1s2 rrr, no m/r/g PULM: resps even non labored on [place], scattered ronchi, no audible wheeze GI: abd round, soft, +bs Extremities:  Warm and dry, scant BLE edema    IMPRESSION/ PLAN: 1. Dyspnea - multifactorial in setting acute on chronic CHF and bronchitis with asthma likely exacerbated by  viral syndrome.  Much improved.  Remains on O2.  PLAN -  Change BD to MDI Steroid taper in place, pred 20 , would taper over 7 days to off Wean O2 as able Ambulatory desat - hopefully no need for home O2  Would recommend consider change to selective Beta Mucolytic Ok to complete short course Azithro ( 5 days)  although likely viral Pulmonary hygiene Will f/u with Dr. Maple Hudson as outpt Appears that renal fxn could tolerate more aggressive neg balance with lasix, cards note reviewed, even balance goal today pcxr on 1/3 c/w fluid fissure, int edema   Follow-up Information    Follow up with Waymon Budge, MD on 03/19/2011. (2:45 pm)    Contact information:   [address] [employer] [place] [address] [phone]          PCCM signing off, will f/u as outpt, please call if needed.    Danford Bad, NP 02/26/2011  11:07 AM  *Care during the described time interval was provided by me and/or other providers  on the critical care team. I have reviewed this patient's available data, including medical history, events of note, physical examination and test results as part of my evaluation.   Mcarthur Rossetti. Tyson Alias, MD, FACP Pgr: [phone] [facility]

## 2011-02-26 NOTE — Progress Notes (Signed)
Pt desat to 86% on room air at rest. Reapplied to 2l/min nasal cannula Debra Johnston

## 2011-02-26 NOTE — Progress Notes (Signed)
SUBJECTIVE: The patient is doing well today.  Continues to have cough and SOB.  + stress incontinence with cough Very concerned about "going home too early"     . ipratropium  0.5 mg Nebulization QID   And  . albuterol  2.5 mg Nebulization QID  . aspirin EC  81 mg Oral Daily  . azithromycin  250 mg Oral Daily  . carvedilol  25 mg Oral BID WC  . dorzolamide  1 drop Both Eyes BID  . enoxaparin  40 mg Subcutaneous QHS  . ferrous sulfate  325 mg Oral QODAY  . FLUoxetine  20 mg Oral Q1200  . guaiFENesin  600 mg Oral Q12H  . levothyroxine  75 mcg Oral Daily  . multivitamin  1 tablet Oral Daily  . OLANZapine  5 mg Oral Q1200  . olmesartan  20 mg Oral Daily  . pantoprazole  40 mg Oral Daily  . predniSONE  20 mg Oral BID WC  . sodium chloride  3 mL Intravenous Q12H  . DISCONTD: furosemide  40 mg Intravenous Daily  . DISCONTD: potassium chloride SA  20 mEq Oral BID      OBJECTIVE: Physical Exam: Filed Vitals:   02/25/11 2026 02/26/11 0010 02/26/11 0400 02/26/11 0810  BP: 143/59 170/75 153/63   Pulse: 71 96    Temp: 98.3 F (36.8 C) 98.5 F (36.9 C) 97.8 F (36.6 C)   TempSrc: Oral Oral Oral   Resp: 15 24    Height:      Weight:   232 lb 2.3 oz (105.3 kg)   SpO2: 97% 93%  99%    Intake/Output Summary (Last 24 hours) at 02/26/11 1610 Last data filed at 02/25/11 2040  Gross per 24 hour  Intake    775 ml  Output      1 ml  Net    774 ml    Telemetry reveals sinus rhythm  GEN- The patient is well appearing, alert and oriented x 3 today.   Head- normocephalic, atraumatic Eyes-  Sclera clear, conjunctiva pink Ears- hearing intact Oropharynx- clear Neck- supple, JVP 10 Lymph- no cervical lymphadenopathy Lungs- diffuse expiratory wheezes, no rales Heart- Regular rate and rhythm, no murmurs, rubs or gallops, PMI not laterally displaced GI- soft, NT, ND, + BS Extremities- no clubbing, cyanosis, or edema Skin- no rash or lesion Psych- euthymic mood, full affect Neuro-  strength and sensation are intact  LABS: Basic Metabolic Panel:  Basename 02/26/11 0505 02/25/11 0440  NA 137 140  K 5.3* 4.2  CL 101 100  CO2 24 30  GLUCOSE 125* 115*  BUN 24* 25*  CREATININE 1.05 1.43*  CALCIUM 9.9 9.5  MG -- --  PHOS -- --   Liver Function Tests: No results found for this basename: AST:2,ALT:2,ALKPHOS:2,BILITOT:2,PROT:2,ALBUMIN:2 in the last 72 hours No results found for this basename: LIPASE:2,AMYLASE:2 in the last 72 hours CBC:  Basename 02/24/11 0530  WBC 8.9  NEUTROABS --  HGB 10.8*  HCT 34.7*  MCV 99.1  PLT 245   RADIOLOGY: Dg Chest 2 View  02/22/2011  *RADIOLOGY REPORT*  Clinical Data: Cough and shortness of breath, fever  CHEST - 2 VIEW  Comparison: July 30, 2008  Findings: The cardiac silhouette, mediastinum, pulmonary vasculature are within normal limits.  Both lungs are clear. There is no acute bony abnormality.  IMPRESSION: There is no evidence of acute cardiac or pulmonary process.  Original Report Authenticated By: Brandon Melnick, M.D.    ASSESSMENT AND PLAN:  Active Problems:  Acute bronchitis  Acute on chronic diastolic heart failure  Renal insufficiency   Briefly, 78 yo female with obesity and diastolic dysfunction now presents with progressive cough and SOB. She has stable BLE edema.  Exam is most consistent with acute bronchitis and secondary volume overload  1. Acute bronchitis- continue azithromycin, appreciate pulm input, initiated on steroids  2. Acute on chronic diastolic dysfunction, now euvolemic Keep Is and Os even  3. HTN- above goal Increase olmesartan to 40mg  daily  Will continue to require inpatient management Transfer to telemetry Cardiac rehab to see  Hillis Range, MD 02/26/2011 8:21 AM

## 2011-02-27 LAB — BASIC METABOLIC PANEL
BUN: 24 mg/dL — ABNORMAL HIGH (ref 6–23)
CO2: 26 mEq/L (ref 19–32)
Chloride: 104 mEq/L (ref 96–112)
Creatinine, Ser: 1.09 mg/dL (ref 0.50–1.10)
GFR calc Af Amer: 54 mL/min — ABNORMAL LOW (ref >90)
Glucose, Bld: 114 mg/dL — ABNORMAL HIGH (ref 70–99)
Potassium: 3.9 mEq/L (ref 3.5–5.1)

## 2011-02-27 MED ORDER — NEBIVOLOL HCL 10 MG PO TABS
10.0000 mg | ORAL_TABLET | Freq: Every day | ORAL | Status: DC
  Administered 2011-02-27 – 2011-03-04 (×6): 10 mg via ORAL
  Filled 2011-02-27 (×6): qty 1

## 2011-02-27 MED ORDER — FUROSEMIDE 40 MG PO TABS
40.0000 mg | ORAL_TABLET | Freq: Every day | ORAL | Status: DC
  Administered 2011-02-27 – 2011-02-28 (×2): 40 mg via ORAL
  Filled 2011-02-27 (×3): qty 1

## 2011-02-27 NOTE — Plan of Care (Signed)
Problem: Food- and Nutrition-Related Knowledge Deficit (NB-1.1) Goal: Nutrition education Formal process to instruct or train a patient/client in a skill or to impart knowledge to help patients/clients voluntarily manage or modify food choices and eating behavior to maintain or improve health.  Outcome: Completed/Met Date Met:  02/27/11 RD pulled to patient from HF rounds. Patient expressed she had limited knowledge of low sodium diet. Patient had read HF booklet but had many questions. RD provided [employer] low sodium handout and discussed ways to decrease sodium in diet. Patient asked appropriate questions and verbalized understanding. RD expects fair to good compliance with diet instruction. Chart reviewed, no other nutrition interventions at this time.   Debra Johnston

## 2011-02-27 NOTE — Progress Notes (Signed)
SUBJECTIVE: The patient is doing well today.  Continues to have cough and SOB.  + stress incontinence with cough NO new events    . albuterol-ipratropium  2 puff Inhalation Q6H  . aspirin EC  81 mg Oral Daily  . dorzolamide  1 drop Both Eyes BID  . enoxaparin  40 mg Subcutaneous QHS  . ferrous sulfate  325 mg Oral QODAY  . FLUoxetine  20 mg Oral Q1200  . furosemide  40 mg Oral Daily  . guaiFENesin  600 mg Oral Q12H  . levothyroxine  75 mcg Oral Daily  . multivitamin  1 tablet Oral Daily  . OLANZapine  5 mg Oral Q1200  . olmesartan  40 mg Oral Daily  . pantoprazole  40 mg Oral Daily  . predniSONE  20 mg Oral BID WC  . sodium chloride  3 mL Intravenous Q12H  . DISCONTD: albuterol  2.5 mg Nebulization QID  . DISCONTD: carvedilol  25 mg Oral BID WC  . DISCONTD: ipratropium  0.5 mg Nebulization QID      OBJECTIVE: Physical Exam: Filed Vitals:   02/26/11 1933 02/27/11 0156 02/27/11 0621 02/27/11 0834  BP: 157/81 146/77 155/78   Pulse: 79 80 84   Temp: 97.7 F (36.5 C) 98.5 F (36.9 C) 97.7 F (36.5 C)   TempSrc: Oral Oral Oral   Resp: 18 18 18    Height: 5\' 10"  (1.778 m)     Weight: 230 lb 2.6 oz (104.4 kg)  228 lb 12.8 oz (103.783 kg)   SpO2: 96% 95% 94% 95%    Intake/Output Summary (Last 24 hours) at 02/27/11 1026 Last data filed at 02/27/11 0900  Gross per 24 hour  Intake    960 ml  Output    250 ml  Net    710 ml    Telemetry reveals sinus rhythm  GEN- The patient is well appearing, alert and oriented x 3 today.   Head- normocephalic, atraumatic Eyes-  Sclera clear, conjunctiva pink Ears- hearing intact Oropharynx- clear Neck- supple, JVP 10 Lymph- no cervical lymphadenopathy Lungs- diffuse expiratory wheezes, no rales Heart- Regular rate and rhythm, no murmurs, rubs or gallops, PMI not laterally displaced GI- soft, NT, ND, + BS Extremities- no clubbing, cyanosis, or edema Skin- no rash or lesion Psych- euthymic mood, full affect Neuro- strength and  sensation are intact  LABS: Basic Metabolic Panel:  Basename 02/27/11 0609 02/26/11 0505  NA 141 137  K 3.9 5.3*  CL 104 101  CO2 26 24  GLUCOSE 114* 125*  BUN 24* 24*  CREATININE 1.09 1.05  CALCIUM 10.0 9.9  MG -- --  PHOS -- --   Liver Function Tests: No results found for this basename: AST:2,ALT:2,ALKPHOS:2,BILITOT:2,PROT:2,ALBUMIN:2 in the last 72 hours No results found for this basename: LIPASE:2,AMYLASE:2 in the last 72 hours CBC: No results found for this basename: WBC:2,NEUTROABS:2,HGB:2,HCT:2,MCV:2,PLT:2 in the last 72 hours RADIOLOGY: Dg Chest 2 View  02/22/2011  *RADIOLOGY REPORT*  Clinical Data: Cough and shortness of breath, fever  CHEST - 2 VIEW  Comparison: July 30, 2008  Findings: The cardiac silhouette, mediastinum, pulmonary vasculature are within normal limits.  Both lungs are clear. There is no acute bony abnormality.  IMPRESSION: There is no evidence of acute cardiac or pulmonary process.  Original Report Authenticated By: Brandon Melnick, M.D.    ASSESSMENT AND PLAN:  Active Problems:  Acute bronchitis  Acute on chronic diastolic heart failure  Renal insufficiency   Briefly, 78 yo female with obesity  and diastolic dysfunction now presents with progressive cough and SOB. She has stable BLE edema.  Exam is most consistent with acute bronchitis and secondary volume overload  1. Acute bronchitis s/p treatment with azithromycin, appreciate pulm input, initiated on steroid taper Given ongoing wheezing, will switch coreg to bystolic  2. Acute on chronic diastolic dysfunction, very mild volume overload Lasix 40mg  daily  3. HTN-  olmesartan increased to 40mg  daily yesterday Switch coreg to bystolic as above  Wean O2 as above Ambulate/ OOB Hope to discharge tomorrow with close outpatient follow-up by PCP  Hillis Range, MD 02/27/2011 10:26 AM

## 2011-02-28 LAB — BASIC METABOLIC PANEL WITH GFR
BUN: 30 mg/dL — ABNORMAL HIGH (ref 6–23)
CO2: 25 meq/L (ref 19–32)
Calcium: 10.3 mg/dL (ref 8.4–10.5)
Chloride: 102 meq/L (ref 96–112)
Creatinine, Ser: 1.13 mg/dL — ABNORMAL HIGH (ref 0.50–1.10)
GFR calc Af Amer: 51 mL/min — ABNORMAL LOW
GFR calc non Af Amer: 44 mL/min — ABNORMAL LOW
Glucose, Bld: 118 mg/dL — ABNORMAL HIGH (ref 70–99)
Potassium: 4.3 meq/L (ref 3.5–5.1)
Sodium: 141 meq/L (ref 135–145)

## 2011-02-28 LAB — GLUCOSE, CAPILLARY: Glucose-Capillary: 124 mg/dL — ABNORMAL HIGH (ref 70–99)

## 2011-02-28 MED ORDER — IPRATROPIUM-ALBUTEROL 18-103 MCG/ACT IN AERO
2.0000 | INHALATION_SPRAY | Freq: Two times a day (BID) | RESPIRATORY_TRACT | Status: DC
  Administered 2011-03-01 – 2011-03-02 (×4): 2 via RESPIRATORY_TRACT

## 2011-02-28 MED ORDER — AMLODIPINE BESYLATE 5 MG PO TABS
5.0000 mg | ORAL_TABLET | Freq: Every day | ORAL | Status: DC
  Administered 2011-02-28 – 2011-03-04 (×5): 5 mg via ORAL
  Filled 2011-02-28 (×6): qty 1

## 2011-02-28 MED ORDER — AZITHROMYCIN 250 MG PO TABS
250.0000 mg | ORAL_TABLET | Freq: Every day | ORAL | Status: DC
  Administered 2011-02-28 – 2011-03-04 (×5): 250 mg via ORAL
  Filled 2011-02-28 (×5): qty 1

## 2011-02-28 NOTE — Progress Notes (Signed)
Patient ID: Debra Johnston, female   DOB: 10-21-1927, 78 y.o.   MRN: [id number]    SUBJECTIVE: Still coughing and wheezing, feels poorly.  Has not been up to walk.      . albuterol-ipratropium  2 puff Inhalation Q6H  . amLODipine  5 mg Oral Daily  . aspirin EC  81 mg Oral Daily  . azithromycin  250 mg Oral Daily  . dorzolamide  1 drop Both Eyes BID  . enoxaparin  40 mg Subcutaneous QHS  . ferrous sulfate  325 mg Oral QODAY  . FLUoxetine  20 mg Oral Q1200  . furosemide  40 mg Oral Daily  . guaiFENesin  600 mg Oral Q12H  . levothyroxine  75 mcg Oral Daily  . multivitamin  1 tablet Oral Daily  . nebivolol  10 mg Oral Daily  . OLANZapine  5 mg Oral Q1200  . olmesartan  40 mg Oral Daily  . pantoprazole  40 mg Oral Daily  . predniSONE  20 mg Oral BID WC  . sodium chloride  3 mL Intravenous Q12H  . DISCONTD: carvedilol  25 mg Oral BID WC      Filed Vitals:   02/27/11 2141 02/28/11 0545 02/28/11 0829 02/28/11 0830  BP:  183/71    Pulse:  70 67   Temp:  98.5 F (36.9 C)    TempSrc:  Oral    Resp:  18 18   Height:      Weight:  102.468 kg (225 lb 14.4 oz)    SpO2: 95% 96%  95%    Intake/Output Summary (Last 24 hours) at 02/28/11 0958 Last data filed at 02/28/11 3086  Gross per 24 hour  Intake   1280 ml  Output    250 ml  Net   1030 ml    LABS: Basic Metabolic Panel:  Basename 02/28/11 0626 02/27/11 0609  NA 141 141  K 4.3 3.9  CL 102 104  CO2 25 26  GLUCOSE 118* 114*  BUN 30* 24*  CREATININE 1.13* 1.09  CALCIUM 10.3 10.0  MG -- --  PHOS -- --    RADIOLOGY: Dg Chest 2 View  02/22/2011  *RADIOLOGY REPORT*  Clinical Data: Cough and shortness of breath, fever  CHEST - 2 VIEW  Comparison: July 30, 2008  Findings: The cardiac silhouette, mediastinum, pulmonary vasculature are within normal limits.  Both lungs are clear. There is no acute bony abnormality.  IMPRESSION: There is no evidence of acute cardiac or pulmonary process.  Original Report Authenticated By:  Brandon Melnick, M.D.    PHYSICAL EXAM General: NAD Neck: No JVD, no thyromegaly or thyroid nodule.  Lungs: Rhonchi bilaterally CV: Nondisplaced PMI.  Heart regular S1/S2, no S3/S4, no murmur.  No peripheral edema.  No carotid bruit.  Normal pedal pulses.  Abdomen: Soft, nontender, no hepatosplenomegaly, no distention.  Neurologic: Alert and oriented x 3.  Psych: Normal affect. Extremities: No clubbing or cyanosis.   ASSESSMENT AND PLAN: 78 yo presented with acute bronchitis/asthma exacerbation with co-existing acute on chronic diastolic CHF.  1. CHF: Acute on chronic diastolic CHF.  She actually appears euvolemic now on exam.  Continue po Lasix.  2. Acute bronchitis: Viral syndrome likely triggered asthma exacerbation.  She is on prednisone, combivent, azithromycin.  Lungs still with a lot of rhonchi.  Need to work on getting her off oxygen.  3. HTN: BP still quite high. Add amlodipine.  4. Disposition: Has not been up to walk.  Has significant  joint pain and walks with walker at home.  PT to evaluate.  Discussed with husband, he is leery about taking her home today. Will keep her one more day given ongoing respiratory symptoms.   Marca Ancona 02/28/2011 10:03 AM

## 2011-02-28 NOTE — Progress Notes (Signed)
Took pt off oxygen at 1015. Pt oxygen saturation is 91-93% RA.

## 2011-02-28 NOTE — Progress Notes (Signed)
UR Completed.  Debra Johnston 02/28/2011 [phone]  

## 2011-02-28 NOTE — Progress Notes (Signed)
Pt oxygen saturation dropped to 88 on RA.  Pt was placed on 1L oxygen. Pts oxygen saturation went back up to 94%.

## 2011-03-01 ENCOUNTER — Inpatient Hospital Stay (HOSPITAL_COMMUNITY): Payer: Medicare Other

## 2011-03-01 LAB — BASIC METABOLIC PANEL
GFR calc Af Amer: 45 mL/min — ABNORMAL LOW (ref >90)
GFR calc non Af Amer: 39 mL/min — ABNORMAL LOW (ref >90)
Potassium: 3.9 mEq/L (ref 3.5–5.1)
Sodium: 141 mEq/L (ref 135–145)

## 2011-03-01 MED ORDER — ONDANSETRON HCL 4 MG PO TABS
4.0000 mg | ORAL_TABLET | Freq: Once | ORAL | Status: AC
  Administered 2011-03-01: 4 mg via ORAL
  Filled 2011-03-01: qty 1

## 2011-03-01 MED ORDER — DEXTROSE 50 % IV SOLN
INTRAVENOUS | Status: AC
  Filled 2011-03-01: qty 50

## 2011-03-01 MED ORDER — PREDNISONE 10 MG PO TABS
10.0000 mg | ORAL_TABLET | Freq: Two times a day (BID) | ORAL | Status: AC
  Administered 2011-03-01 – 2011-03-03 (×6): 10 mg via ORAL
  Filled 2011-03-01 (×6): qty 1

## 2011-03-01 NOTE — Progress Notes (Signed)
Patient ID: Debra Johnston, female   DOB: 27-Sep-1927, 78 y.o.   MRN: [id number]  SUBJECTIVE: Still coughing and wheezing, feels poorly.  PT did not see her yesterday.    Marland Kitchen albuterol-ipratropium  2 puff Inhalation BID  . amLODipine  5 mg Oral Daily  . aspirin EC  81 mg Oral Daily  . azithromycin  250 mg Oral Daily  . dorzolamide  1 drop Both Eyes BID  . enoxaparin  40 mg Subcutaneous QHS  . ferrous sulfate  325 mg Oral QODAY  . FLUoxetine  20 mg Oral Q1200  . guaiFENesin  600 mg Oral Q12H  . levothyroxine  75 mcg Oral Daily  . multivitamin  1 tablet Oral Daily  . nebivolol  10 mg Oral Daily  . OLANZapine  5 mg Oral Q1200  . olmesartan  40 mg Oral Daily  . pantoprazole  40 mg Oral Daily  . predniSONE  10 mg Oral BID WC  . predniSONE  20 mg Oral BID WC  . sodium chloride  3 mL Intravenous Q12H  . DISCONTD: albuterol-ipratropium  2 puff Inhalation Q6H  . DISCONTD: furosemide  40 mg Oral Daily      Filed Vitals:   02/28/11 1343 02/28/11 2036 02/28/11 2038 03/01/11 0409  BP: 147/45 146/46  157/80  Pulse: 73 68  60  Temp: 97.7 F (36.5 C) 98 F (36.7 C)  98.3 F (36.8 C)  TempSrc: Oral Axillary  Oral  Resp: 20 18  18   Height:      Weight:    227 lb 6.4 oz (103.148 kg)  SpO2: 94% 94% 94% 96%    Intake/Output Summary (Last 24 hours) at 03/01/11 0827 Last data filed at 03/01/11 0804  Gross per 24 hour  Intake    723 ml  Output    351 ml  Net    372 ml    LABS: Basic Metabolic Panel:  Basename 03/01/11 0535 02/28/11 0626  NA 141 141  K 3.9 4.3  CL 102 102  CO2 26 25  GLUCOSE 87 118*  BUN 38* 30*  CREATININE 1.26* 1.13*  CALCIUM 10.1 10.3  MG -- --  PHOS -- --    RADIOLOGY: Dg Chest 2 View  02/22/2011  *RADIOLOGY REPORT*  Clinical Data: Cough and shortness of breath, fever  CHEST - 2 VIEW  Comparison: July 30, 2008  Findings: The cardiac silhouette, mediastinum, pulmonary vasculature are within normal limits.  Both lungs are clear. There is no acute bony  abnormality.  IMPRESSION: There is no evidence of acute cardiac or pulmonary process.  Original Report Authenticated By: Brandon Melnick, M.D.    PHYSICAL EXAM General: NAD Neck: No JVD, no thyromegaly or thyroid nodule.  Lungs: Rhonchi bilaterally, frequent cough CV: Nondisplaced PMI.  Heart regular S1/S2, no S3/S4, no murmur.  No peripheral edema.  No carotid bruit.  Normal pedal pulses.  Abdomen: Soft, nontender, no hepatosplenomegaly, no distention.  Neurologic: Alert and oriented x 3.  Psych: Normal affect. Extremities: No clubbing or cyanosis.   ASSESSMENT AND PLAN: 78 yo presented with acute bronchitis/asthma exacerbation with co-existing acute on chronic diastolic CHF.  1. CHF: Acute on chronic diastolic CHF.  She actually appears dry now on exam.  Will put po Lasix on hold.  2. Acute bronchitis: Viral syndrome likely triggered asthma exacerbation.  She is on prednisone, combivent, azithromycin.  Lungs still with a lot of rhonchi.  Need to work on getting her off oxygen.  Given poor  progress, will repeat CXR today PT to assess and check O2 requirement with ambulation. 3. HTN: BP still quite high. Amlodipine added yesterday 4. Disposition: Has not been up to walk.  Has significant joint pain and walks with walker at home.  PT to evaluate and help with assessment of home needs.  She is not ready for discharge.  She may benefit from SNF.  Will see what PT thinks.   Debra Johnston Debra Johnston 03/01/2011 8:27 AM

## 2011-03-01 NOTE — Progress Notes (Signed)
Physical Therapy Evaluation Patient Details Name: Debra Johnston MRN: [id number] DOB: 05/28/1927 Today's Date: 03/01/2011  Problem List:  Patient Active Problem List  Diagnoses  . EDEMA LEG  . Acute bronchitis  . Acute on chronic diastolic heart failure  . Renal insufficiency    Past Medical History:  Past Medical History  Diagnosis Date  . Hypertension   . CHF (congestive heart failure)   . Coronary artery disease   . GERD (gastroesophageal reflux disease)   . Glaucoma   . Osteoarthritis   . Fibromyalgia   . Hyperlipidemia   . Shortness of breath   . Hypothyroidism   . Anxiety   . Depression   . Bronchitis 02/22/2011   Past Surgical History:  Past Surgical History  Procedure Date  . Hemorrhoid surgery   . Appendectomy   . Laparoscopic hysterectomy   . Knee surgery     bilateral  . Cholecystectomy     pt denies having cholecystectomy    PT Assessment/Plan/Recommendation PT Assessment Clinical Impression Statement: pt presents with acute bronchitis and generally weak.  pt notes she has a lot of trouble with urinary incontinence and needed A with hygiene and donning panty and pad prior to ambulation PT Recommendation/Assessment: Patient will need skilled PT in the acute care venue PT Problem List: Decreased strength;Decreased activity tolerance;Decreased balance;Decreased mobility;Decreased knowledge of use of DME;Cardiopulmonary status limiting activity Barriers to Discharge: None PT Therapy Diagnosis : Difficulty walking PT Plan PT Frequency: Min 3X/week PT Treatment/Interventions: DME instruction;Gait training;Stair training;Functional mobility training;Therapeutic activities;Therapeutic exercise;Balance training;Patient/family education PT Recommendation Recommendations for Other Services: OT consult Follow Up Recommendations: Home health PT;Supervision - Intermittent Equipment Recommended: None recommended by PT PT Goals  Acute Rehab PT Goals PT Goal  Formulation: With patient Time For Goal Achievement: 2 weeks Pt will go Supine/Side to Sit: Independently PT Goal: Supine/Side to Sit - Progress: Not met Pt will go Sit to Supine/Side: Independently PT Goal: Sit to Supine/Side - Progress: Not met Pt will go Sit to Stand: with supervision;with upper extremity assist PT Goal: Sit to Stand - Progress: Not met Pt will Ambulate: >150 feet;with supervision;with rolling walker PT Goal: Ambulate - Progress: Not met Pt will Go Up / Down Stairs: 3-5 stairs;with min assist;with rail(s) PT Goal: Up/Down Stairs - Progress: Not met  PT Evaluation Precautions/Restrictions  Precautions Precautions: Fall (pt with urinary incontinence) Restrictions Weight Bearing Restrictions: No Prior Functioning  Home Living Lives With: Spouse Receives Help From: Family Type of Home: House Home Layout: One level Home Access: Stairs to enter Entrance Stairs-Rails: Lawyer of Steps: 3 Bathroom Shower/Tub:  (pt bathes at sink) Firefighter: Standard Bathroom Accessibility: Yes How Accessible: Accessible via walker Home Adaptive Equipment: Walker - four wheeled (Raised Toilet seat without rails) Prior Function Level of Independence: Independent with transfers;Independent with gait;Requires assistive device for independence;Needs assistance with ADLs;Needs assistance with homemaking Able to Take Stairs?: Yes Driving: No Vocation: Retired Designer, television/film set Level: Oriented X4 Sensation/Coordination   Extremity Assessment RLE Assessment RLE Assessment:  (Grossly 4/5) LLE Assessment LLE Assessment:  (Grossly 4/5) Mobility (including Balance) Bed Mobility Bed Mobility: No Transfers Transfers: Yes Sit to Stand: 5: Supervision;With upper extremity assist;From chair/3-in-1 Sit to Stand Details (indicate cue type and reason): demos good use of UEs Stand to Sit: 5: Supervision;With upper extremity assist;To  chair/3-in-1 Stand to Sit Details: cues to use armrests and control descent.   Ambulation/Gait Ambulation/Gait: Yes Ambulation/Gait Assistance: 5: Supervision Ambulation/Gait Assistance Details (indicate cue type and  reason): pt moves slowly and fatigues quickly.  O2 sats remained 91-95% on RA during ambulation.   Ambulation Distance (Feet): 150 Feet Assistive device: Rolling walker Gait Pattern: Step-through pattern;Shuffle;Trunk flexed Stairs: No Wheelchair Mobility Wheelchair Mobility: No  Posture/Postural Control Posture/Postural Control: No significant limitations Exercise    End of Session PT - End of Session Equipment Utilized During Treatment: Gait belt Activity Tolerance: Patient limited by fatigue Patient left: in chair;with call bell in reach;with family/visitor present Nurse Communication: Mobility status for ambulation General Behavior During Session: [facility] for tasks performed Cognition: [facility] for tasks performed  Sunny Schlein, [place] [phone] 03/01/2011, 11:03 AM

## 2011-03-02 NOTE — Progress Notes (Signed)
CSW met with patient and her husband at the bedside with Dr. Johney Frame. Patient at this time is not appropriate for SNF level of care. However, the patient's family, husband and son, have many concerns with her returning home. Dr, Johney Frame also has concerns about the patient's safety and returning home. The pt's husband reported that he has already applied for medicaid and ALF placement might be possible with Medicaid pending. CSW will follow up with this on Monday and see what options are available to the family.

## 2011-03-02 NOTE — Progress Notes (Signed)
CSW met with Debra Johnston and patient's husband. They are wanting to go to [facility]. However, Debra Johnston is reccommeding that the patient return home with home health. CSW will contact Dr. Johney Frame to discuss this with him.

## 2011-03-02 NOTE — Plan of Care (Signed)
Problem: Phase III Progression Outcomes Goal: Other Phase III Outcomes/Goals Outcome: Completed/Met Date Met:  03/02/11 Weaned off oxygen

## 2011-03-02 NOTE — Progress Notes (Signed)
Pt's husband asked to inform SW that pt has a bed at [facility] facility.  Amy, Sw  Notified.

## 2011-03-02 NOTE — Progress Notes (Signed)
Patient ID: Debra Johnston, female   DOB: [birth year], 78 y.o.   MRN: [id number]  SUBJECTIVE: Still coughing but wheezing is better, denies chest pain    . albuterol-ipratropium  2 puff Inhalation BID  . amLODipine  5 mg Oral Daily  . aspirin EC  81 mg Oral Daily  . azithromycin  250 mg Oral Daily  . dorzolamide  1 drop Both Eyes BID  . enoxaparin  40 mg Subcutaneous QHS  . ferrous sulfate  325 mg Oral QODAY  . FLUoxetine  20 mg Oral Q1200  . guaiFENesin  600 mg Oral Q12H  . levothyroxine  75 mcg Oral Daily  . multivitamin  1 tablet Oral Daily  . nebivolol  10 mg Oral Daily  . OLANZapine  5 mg Oral Q1200  . olmesartan  40 mg Oral Daily  . pantoprazole  40 mg Oral Daily  . predniSONE  10 mg Oral BID WC  . sodium chloride  3 mL Intravenous Q12H      Filed Vitals:   03/02/11 0543 03/02/11 0928 03/02/11 1036 03/02/11 1302  BP: 148/61  148/61 135/58  Pulse: 64   65  Temp: 97.3 F (36.3 C)   98.3 F (36.8 C)  TempSrc: Oral     Resp: 18   18  Height:      Weight: 226 lb 4.8 oz (102.649 kg)     SpO2: 95% 65%  91%    Intake/Output Summary (Last 24 hours) at 03/02/11 1608 Last data filed at 03/02/11 1249  Gross per 24 hour  Intake    853 ml  Output    930 ml  Net    -77 ml    LABS: Basic Metabolic Panel:  Basename 03/01/11 0535 02/28/11 0626  NA 141 141  K 3.9 4.3  CL 102 102  CO2 26 25  GLUCOSE 87 118*  BUN 38* 30*  CREATININE 1.26* 1.13*  CALCIUM 10.1 10.3  MG -- --  PHOS -- --    RADIOLOGY: Dg Chest 2 View  02/22/2011  *RADIOLOGY REPORT*  Clinical Data: Cough and shortness of breath, fever  CHEST - 2 VIEW  Comparison: July 30, 2008  Findings: The cardiac silhouette, mediastinum, pulmonary vasculature are within normal limits.  Both lungs are clear. There is no acute bony abnormality.  IMPRESSION: There is no evidence of acute cardiac or pulmonary process.  Original Report Authenticated By: Brandon Melnick, M.D.    PHYSICAL EXAM General: NAD Neck: No JVD, no  thyromegaly or thyroid nodule.  Lungs: Rhonchi bilaterally, frequent cough CV: Nondisplaced PMI.  Heart regular S1/S2, no S3/S4, no murmur.  No peripheral edema.  No carotid bruit.  Normal pedal pulses.  Abdomen: Soft, nontender, no hepatosplenomegaly, no distention.  Neurologic: Alert and oriented x 3.  Psych: Normal affect. Extremities: No clubbing or cyanosis.   ASSESSMENT AND PLAN: 78 yo presented with acute bronchitis/asthma exacerbation with co-existing acute on chronic diastolic CHF.  1. CHF: Acute on chronic diastolic CHF.  She actually appears dry now on exam.  Will put po Lasix on hold.  2. Acute bronchitis: Viral syndrome likely triggered asthma exacerbation.  She is on prednisone, combivent, azithromycin.   Now off of O2 CXR reviewed 3. HTN: BP still quite high. Amlodipine added 4. Disposition:  I have a 45 minute conversation with the patient, her spouse, and son (by phone) with Amy Riley Kill providing me assistance.  This is a very difficult disposition.  The patient requires significant assistance at  home, which her family does not feel that they can provide.  I will ask OT to assess and also have PT reassess.  I presently am not certain that she is safe at home.  If her family feels that they can assist her adequately, then she could go home this weekend, otherwise I will have social work look further into this situation on Monday.   [name] Sota Hetz 03/02/2011 4:08 PM

## 2011-03-03 DIAGNOSIS — I5033 Acute on chronic diastolic (congestive) heart failure: Secondary | ICD-10-CM

## 2011-03-03 LAB — BASIC METABOLIC PANEL
BUN: 36 mg/dL — ABNORMAL HIGH (ref 6–23)
Chloride: 103 mEq/L (ref 96–112)
GFR calc Af Amer: 54 mL/min — ABNORMAL LOW (ref >90)
Potassium: 4.6 mEq/L (ref 3.5–5.1)

## 2011-03-03 LAB — PRO B NATRIURETIC PEPTIDE: Pro B Natriuretic peptide (BNP): 356.6 pg/mL (ref 0–450)

## 2011-03-03 MED ORDER — IPRATROPIUM-ALBUTEROL 18-103 MCG/ACT IN AERO: 2.0000 | INHALATION_SPRAY | Freq: Four times a day (QID) | RESPIRATORY_TRACT | Status: DC | PRN

## 2011-03-03 NOTE — Progress Notes (Signed)
Pt ambulated from room 4709 to nurses station with use of rolling walker. Pt only slightly fatigued.  o2 sat started at 94% and only went down to 92% with quick recovery back to 94%. Denies pain.

## 2011-03-03 NOTE — Progress Notes (Signed)
   CARE MANAGEMENT NOTE HEART FAILURE  03/03/2011   Patient:  Debra Johnston, Debra Johnston   Account Number:  [id number]    Date Initiated:  02/28/2011  Documentation initiated by:  Shannan Harper  Subjective/Objective Assessment:   Patient admitted with increasing SOB related to CHF, oxygen saturations in the mid 80's.   Action/Plan:   Anticipate discharge back to home with spouse.  Will discuss HH options such as a HHRN for CHF management.   Anticipated DC Date:  03/04/2011  Anticipated DC Plan:  HOME W HOME HEALTH SERVICES  DC Planning Services:  CM consult    [facility] Choice:  HOME HEALTH   Choice offered to / List presented to:  C-3 Spouse    HH arranged:  HH-1 RN  HH-2 PT     HH agency:  [facility]    Status of service:  In process, will continue to follow  Medicare Important Message Given:   (If response is "NO", the following Medicare IM given date fields will be blank) Date Medicare IM Given:   Date Additional Medicare IM Given:    Discharge Disposition:  HOME W HOME HEALTH SERVICES  Per UR Regulation:  Reviewed for med. necessity/level of care/duration of stay  Comments:   03/03/2011 1430 Contacted pt and gave permission to speak with husband. Husband is fine with pt going home with [facility] services. States he prefers she goes to SNF for rehab prior to d/c to get stronger, but aware that SNF was not recommended. Requested [facility] for [facility]. Husband states no DME needed at this time. Husband was concerned pt has not worked with PT in getting up more in the halls. Made pt Unit RN aware. Contacted MD and orders given for [facility]. Contacted [facility] for [facility] for possible d/c on 03/04/2011. Isidoro Donning RN CCM Case Mgmt phone [phone]  Concurrent Review Completed. 02/28/11 1441 Shannan Harper, RN, BSN  UR Completed by Blanche East, RN on 02/23/11   Initial CM contact:     By:   Initial CSW contact:     By:      Is this an INP Readmission < 30 days:  N (If "YES" please see readmission  information at the bottom of note)  Patient living status prior to this admission:  FAMILY  Patient setting prior to this admission:  HOME  Comorbid conditions being treated that contributed to this admission:  HIGH--CHF, HTN, CAD, Mitral Valve  CHF Readmission Risk:  high      Was referral made to [facility]:  N  Is the patient's PCP the same as attending:  N PCP:  [facility]

## 2011-03-03 NOTE — Progress Notes (Signed)
Subjective: Patient complains of L sided abd pain when coughs.  Not otherwise. Objective: Filed Vitals:   03/02/11 1302 03/02/11 2033 03/02/11 2114 03/03/11 0600  BP: 135/58  130/72 155/54  Pulse: 65  67 66  Temp: 98.3 F (36.8 C)  97.8 F (36.6 C) 98.2 F (36.8 C)  TempSrc:   Oral Oral  Resp: 18  18 18   Height:      Weight:    228 lb 14.4 oz (103.828 kg)  SpO2: 91% 94% 91% 94%   Weight change: 2 lb 11.3 oz (1.228 kg)  Intake/Output Summary (Last 24 hours) at 03/03/11 0747 Last data filed at 03/03/11 0612  Gross per 24 hour  Intake   1098 ml  Output    653 ml  Net    445 ml    General: Alert, awake, oriented x3, in no acute distress Neck:  JVP is normal Heart: Regular rate and rhythm, without murmurs, rubs, gallops.  Lungs:Rhonchi bilaterally No rales. ABD:  No signif tenderness with palpitation. Exemities:  Trivedema.   Neuro: Grossly intact, nonfocal.   Lab Results: No results found for this or any previous visit (from the past 24 hour(s)).  Studies/Results: Dg Chest 2 View  03/01/2011  *RADIOLOGY REPORT*  Clinical Data: Shortness of breath and cough  CHEST - 2 VIEW  Comparison: February 22, 2011  Findings: The cardiac silhouette, mediastinum, pulmonary vasculature are within normal limits.  Both lungs are clear.  There is stable elevation of the right hemi diaphragm. There is no acute bony abnormality.  IMPRESSION: There is no evidence of acute cardiac or pulmonary process.  Original Report Authenticated By: Brandon Melnick, M.D.    Medications: I have reviewed the patient's current medications.   Patient Active Hospital Problem List: Acute bronchitis (02/22/2011)   Assessment: Improving slowly.  Probably pulled muscles with coughing.  Continue meds.   Plan:  Acute on chronic diastolic heart failure (02/22/2011)   Assessment: Volume status does not appear bad.  Will check labs.   Plan:  Renal insufficiency (02/25/2011)   Assessment: BMET prending.   Plan:  Dispo  PT to evaluate.  Patient lives at home   Jimmy Footman if able to return.   LOS: 9 days   Dietrich Pates 03/03/2011, 7:47 AM

## 2011-03-04 ENCOUNTER — Other Ambulatory Visit: Payer: Self-pay

## 2011-03-04 LAB — BASIC METABOLIC PANEL
Chloride: 104 mEq/L (ref 96–112)
GFR calc Af Amer: 56 mL/min — ABNORMAL LOW (ref >90)
Potassium: 4.6 mEq/L (ref 3.5–5.1)

## 2011-03-04 MED ORDER — WARFARIN SODIUM 5 MG PO TABS
5.0000 mg | ORAL_TABLET | Freq: Every day | ORAL | Status: DC
  Filled 2011-03-04: qty 1

## 2011-03-04 MED ORDER — NITROGLYCERIN 0.4 MG SL SUBL: 0.4000 mg | SUBLINGUAL_TABLET | SUBLINGUAL | Status: DC | PRN

## 2011-03-04 MED ORDER — FUROSEMIDE 40 MG PO TABS
40.0000 mg | ORAL_TABLET | Freq: Every day | ORAL | Status: DC
  Administered 2011-03-04: 40 mg via ORAL
  Filled 2011-03-04: qty 1

## 2011-03-04 MED ORDER — AMLODIPINE BESYLATE 5 MG PO TABS: 5.0000 mg | ORAL_TABLET | Freq: Every day | ORAL | Status: DC

## 2011-03-04 MED ORDER — PATIENT'S GUIDE TO USING COUMADIN BOOK
Freq: Once | Status: DC
  Filled 2011-03-04: qty 1

## 2011-03-04 MED ORDER — IPRATROPIUM-ALBUTEROL 18-103 MCG/ACT IN AERO: 2.0000 | INHALATION_SPRAY | Freq: Four times a day (QID) | RESPIRATORY_TRACT | Status: DC | PRN

## 2011-03-04 MED ORDER — POTASSIUM CHLORIDE CRYS ER 20 MEQ PO TBCR
20.0000 meq | EXTENDED_RELEASE_TABLET | Freq: Once | ORAL | Status: AC
  Administered 2011-03-04: 20 meq via ORAL
  Filled 2011-03-04: qty 1

## 2011-03-04 MED ORDER — DORZOLAMIDE HCL 2 % OP SOLN: 1.0000 [drp] | Freq: Two times a day (BID) | OPHTHALMIC | Status: DC

## 2011-03-04 NOTE — Progress Notes (Addendum)
Subjective: Patient still coughing.  Pleuritic CP only. Objective: Filed Vitals:   03/03/11 0600 03/03/11 1354 03/03/11 2127 03/04/11 0500  BP: 155/54 133/74 105/65 123/81  Pulse: 66 66 59 93  Temp: 98.2 F (36.8 C) 97.8 F (36.6 C) 97.5 F (36.4 C) 97.5 F (36.4 C)  TempSrc: Oral Oral Oral Oral  Resp: 18 18 20 18   Height:      Weight: 228 lb 14.4 oz (103.828 kg)   226 lb 1.6 oz (102.558 kg)  SpO2: 94% 95% 92% 94%   Weight change: -2 lb 12.8 oz (-1.27 kg)  Intake/Output Summary (Last 24 hours) at 03/04/11 0729 Last data filed at 03/04/11 0510  Gross per 24 hour  Intake    560 ml  Output    676 ml  Net   -116 ml    General: Alert, awake, oriented x3, in no acute distress Neck:  JVP is normal Heart: Regular rate and rhythm, without murmurs, rubs, gallops.  Lungs: Clear to auscultation.  No rales or wheezes. Exemities:  No edema.   Neuro: Grossly intact, nonfocal.   Lab Results: Results for orders placed during the hospital encounter of 02/22/11 (from the past 24 hour(s))  BASIC METABOLIC PANEL     Status: Abnormal   Collection Time   03/03/11  8:10 AM      Component Value Range   Sodium 138  135 - 145 (mEq/L)   Potassium 4.6  3.5 - 5.1 (mEq/L)   Chloride 103  96 - 112 (mEq/L)   CO2 24  19 - 32 (mEq/L)   Glucose, Bld 131 (*) 70 - 99 (mg/dL)   BUN 36 (*) 6 - 23 (mg/dL)   Creatinine, Ser 7.84  0.50 - 1.10 (mg/dL)   Calcium 69.6  8.4 - 10.5 (mg/dL)   GFR calc non Af Amer 46 (*) >90 (mL/min)   GFR calc Af Amer 54 (*) >90 (mL/min)  PRO B NATRIURETIC PEPTIDE     Status: Normal   Collection Time   03/03/11  8:10 AM      Component Value Range   Pro B Natriuretic peptide (BNP) 356.6  0 - 450 (pg/mL)    Studies/Results: No results found.  Medications: I have reviewed the patient's current medications.   Patient Active Hospital Problem List: Acute bronchitis (02/22/2011)   Assessment: Continue Duratuss   Plan:  Acute on chronic diastolic heart failure (02/22/2011)  Assessment: VOlume status is OK  NOte that her lasix was held earlier this admit as a little dry.  Felt above may have been all exacerbated by viral infection.  Labs are pending this AM.   Plan: Renal insufficiency (02/25/2011)   Assessment:   Plan: Rhythm.  Tele is difficult to see.  QUestion afib.  Rhythm appears to have changed abruptly in middle of night.  Rates controlledd  Will get EKG>   LOS: 10 days   Dietrich Pates 03/04/2011, 7:29 AM  EKG done this AM shows atrial fibrillation.  111 bpm.  This has not been documented before. Review of telemetry she is now back in NSR.  Patient did not sense anything different  Rec;  With age, PMH patient should be on coumadin  Start today.  Follow up Thurs with coumadin check Patient should also be seen by PA to assess volume status and get BMET I would check TSH OK to D/C today. Dietrich Pates 10:31 AM 03/04/2011

## 2011-03-04 NOTE — Progress Notes (Signed)
   CARE MANAGEMENT NOTE HEART FAILURE  03/04/2011   Patient:  Debra Johnston, Debra Johnston   Account Number:  [id number]    Date Initiated:  02/28/2011  Documentation initiated by:  Shannan Harper  Subjective/Objective Assessment:   Patient admitted with increasing SOB related to CHF, oxygen saturations in the mid 80's.   Action/Plan:   Anticipate discharge back to home with spouse.  Will discuss HH options such as a HHRN for CHF management.   Anticipated DC Date:  03/04/2011  Anticipated DC Plan:  HOME W HOME HEALTH SERVICES  DC Planning Services:  CM consult    [facility] Choice:  HOME HEALTH   Choice offered to / List presented to:  C-3 Spouse    HH arranged:  HH-1 RN  HH-2 PT     HH agency:  [facility]    Status of service:  Completed, signed off  Medicare Important Message Given:   (If response is "NO", the following Medicare IM given date fields will be blank) Date Medicare IM Given:   Date Additional Medicare IM Given:    Discharge Disposition:  HOME W HOME HEALTH SERVICES  Per UR Regulation:  Reviewed for med. necessity/level of care/duration of stay  Comments:   03/04/2011 1230 Contacted [facility] to make aware of pt's scheduled d/c today. Isidoro Donning RN CCM Case Mgmt phone [phone]  03/03/2011 1430 Contacted pt and gave permission to speak with husband. Husband is fine with pt going home with [facility] services. States he prefers she goes to SNF for rehab prior to d/c to get stronger, but aware that SNF was not recommended. Requested [facility] for [facility]. Husband states no DME needed at this time. Husband was concerned pt has not worked with PT in getting up more in the halls. Made pt Unit RN aware. Contacted MD and orders given for [facility]. Contacted [facility] for [facility] for possible d/c on 03/04/2011. Isidoro Donning RN CCM Case Mgmt phone [phone]  Concurrent Review Completed. 02/28/11 1441 Shannan Harper, RN, BSN  UR Completed by Blanche East, RN on 02/23/11   Initial CM contact:     By:     Initial CSW contact:     By:      Is this an INP Readmission < 30 days:  N (If "YES" please see readmission information at the bottom of note)  Patient living status prior to this admission:  FAMILY  Patient setting prior to this admission:  HOME  Comorbid conditions being treated that contributed to this admission:  HIGH--CHF, HTN, CAD, Mitral Valve  CHF Readmission Risk:  high      Was referral made to [facility]:  N  Is the patient's PCP the same as attending:  N PCP:  [facility]

## 2011-03-04 NOTE — Discharge Summary (Signed)
Physician Discharge Summary  Patient ID: Debra Johnston MRN: [id number] DOB/AGE: 1927/07/01 78 y.o.  Admit date: 02/22/2011 Discharge date: 03/04/2011  Primary Discharge Diagnosis:  1. Acute on Chronic Diastolic CHF 2. Bronchitis 3. New Onset Atrial Fib Secondary Discharge Diagnosis 1.Renal Insufficiency 2.CAD 3.GERD 4.Hypertension  Significant Diagnostic Studies: ECHO 02/23/2011 Left ventricle: The cavity size was normal. Wall thickness was increased in a pattern of mild LVH. The estimated ejection fraction was 60%. Wall motion was normal; there were no regional wall motion abnormalities. Doppler parameters are consistent with high ventricular filling pressure. - Right ventricle: The cavity size was mildly dilated. Systolic function was mildly reduced. - Pulmonary arteries: PA peak pressure: 33mm Hg (S).  Consults: None  Hospital Course: Debra Johnston is an 78 year old patient of Dr. Roger Shelter and Dr. Duane Lope. The patient has a history of CAD with diastolic heart failure. The patient was admitted with increasing dyspnea on exertion orthopnea and PND along with lower extremity edema. She denied any chest pain. She began having a nonproductive cough with a little wheezing. On arrival to the emergency room she was found to have an O2 sat of 85% was placed on O2 she was given nebulizer treatment and IV Lasix. Pro BNP on admission 689.9. Chest x-ray did not reveal any evidence of pneumonia or CHF. She was admitted for further workup and treatment. Although chest was clear it was evident that she had diastolic dysfunction and was continued on IV Lasix. Initial weight on admission 242 pounds and she diuresis to 226lbs (16 lb wt loss). She was returned to by mouth Lasix and given guaifenesin for continued cough as an outpatient. On day of discharge the patient's telemetry was reviewed by Dr. Tenny Craw and she was shown to have an irregular heart rhythm, a lot of ectopy was seen.  It was believed  to be atrial fibrillation per Dr. Charlott Rakes  note, however she was found to be in normal sinus rhythm t, on examination of telemetry this a.m.Marland KitchenMarland Kitchen Secondary to her age she did not want to place her on anticoagulation at this time. She is to followup with Norma Fredrickson, NP in the office for repeat evaluation with EKG and continued management of diastolic CHF. On followup appointment she will also have BMET drawn.   Discharge Exam: Blood pressure 123/81, pulse 93, temperature 97.5 F (36.4 C), temperature source Oral, resp. rate 18, height 5\' 10"  (1.778 m), weight 226 lb 1.6 oz (102.558 kg), SpO2 94.00%. Labs:   Lab Results  Component Value Date   WBC 8.9 02/24/2011   HGB 10.8* 02/24/2011   HCT 34.7* 02/24/2011   MCV 99.1 02/24/2011   PLT 245 02/24/2011     Lab 03/04/11 0809  NA 139  K 4.6  CL 104  CO2 21  BUN 34*  CREATININE 1.06  CALCIUM 10.6*  PROT --  BILITOT --  ALKPHOS --  ALT --  AST --  GLUCOSE 100*   Lab Results  Component Value Date   CKTOTAL 587* 02/23/2011   CKMB 3.9 02/23/2011   TROPONINI <0.30 02/23/2011         Radiology: Dg Chest 2 View  03/01/2011  *RADIOLOGY REPORT*  Clinical Data: Shortness of breath and cough  CHEST - 2 VIEW  Comparison: February 22, 2011  Findings: The cardiac silhouette, mediastinum, pulmonary vasculature are within normal limits.  Both lungs are clear.  There is stable elevation of the right hemi diaphragm. There is no acute bony abnormality.  IMPRESSION: There is  no evidence of acute cardiac or pulmonary process.  Original Report Authenticated By: Brandon Melnick, M.D.   Dg Chest 2 View  02/22/2011  *RADIOLOGY REPORT*  Clinical Data: Cough and shortness of breath, fever  CHEST - 2 VIEW  Comparison: July 30, 2008  Findings: The cardiac silhouette, mediastinum, pulmonary vasculature are within normal limits.  Both lungs are clear. There is no acute bony abnormality.  IMPRESSION: There is no evidence of acute cardiac or pulmonary process.  Original Report  Authenticated By: Brandon Melnick, M.D.    FOLLOW UP PLANS AND APPOINTMENTS Discharge Orders    Future Appointments: Provider: Department: Dept Phone: Center:   03/19/2011 2:45 PM Waymon Budge, MD [facility] [phone] None     Future Orders Please Complete By Expires   Diet - low sodium heart healthy      Increase activity slowly        Current Discharge Medication List    START taking these medications   Details  albuterol-ipratropium (COMBIVENT) 18-103 MCG/ACT inhaler Inhale 2 puffs into the lungs every 6 (six) hours as needed for wheezing. Qty: 1 Inhaler, Refills: 6    amLODipine (NORVASC) 5 MG tablet Take 1 tablet (5 mg total) by mouth daily. Qty: 30 tablet, Refills: 6    dorzolamide (TRUSOPT) 2 % ophthalmic solution Place 1 drop into both eyes 2 (two) times daily. Qty: 10 mL, Refills: 6    nitroGLYCERIN (NITROSTAT) 0.4 MG SL tablet Place 1 tablet (0.4 mg total) under the tongue every 5 (five) minutes as needed for chest pain. Qty: 30 tablet, Refills: 3      CONTINUE these medications which have NOT CHANGED   Details  aspirin EC 81 MG tablet Take 81 mg by mouth daily.      carvedilol (COREG) 25 MG tablet Take 25 mg by mouth 2 (two) times daily with a meal.      ferrous sulfate 325 (65 FE) MG tablet Take 325 mg by mouth every other day.      FLUoxetine (PROZAC) 20 MG capsule Take 20 mg by mouth daily at 12 noon.      furosemide (LASIX) 40 MG tablet Take 40 mg by mouth daily.      levothyroxine (SYNTHROID, LEVOTHROID) 75 MCG tablet Take 75 mcg by mouth daily.      LORazepam (ATIVAN) 2 MG tablet Take 2 mg by mouth every 8 (eight) hours as needed. anxiety     Multiple Vitamins-Minerals (ICAPS PO) Take 1 tablet by mouth daily.      OLANZapine (ZYPREXA) 5 MG tablet Take 5 mg by mouth daily at 12 noon.      pantoprazole (PROTONIX) 40 MG tablet Take 40 mg by mouth daily. 1 hour before breakfast     potassium chloride SA (K-DUR,KLOR-CON) 20 MEQ tablet Take 20  mEq by mouth daily.      telmisartan-hydrochlorothiazide (MICARDIS HCT) 80-12.5 MG per tablet Take 1 tablet by mouth daily.      traMADol (ULTRAM) 50 MG tablet Take 50 mg by mouth every 6 (six) hours as needed. Pain         Follow-up Information    Follow up with Waymon Budge, MD on 03/19/2011. (2:45 pm)    Contact information:   [address] [employer] [place] [address] [phone]       Follow up with [facility]. [facility] RN and Physical Therapy)    Contact information:   [phone]  Follow up with Norma Fredrickson, NP. (Our office will call you for appointment)    Contact information:   [address] [place] [address] [phone]           Time spent with patient to include physician time:40 minutes  Signed: Joni Reining 03/04/2011, 11:59 AM Co-Sign MD

## 2011-03-07 ENCOUNTER — Encounter: Payer: Self-pay | Admitting: Nurse Practitioner

## 2011-03-09 ENCOUNTER — Telehealth: Payer: Self-pay | Admitting: Internal Medicine

## 2011-03-09 NOTE — Telephone Encounter (Signed)
New Problem   [name] [facility] [phone]  Would like a call back a fall patient had this morning 03/09/11, [name] also notes that patient see Shellia Carwin Please return call

## 2011-03-09 NOTE — Telephone Encounter (Signed)
Advised for pt to go to ER to be evaluated.

## 2011-03-12 ENCOUNTER — Encounter: Payer: Self-pay | Admitting: Nurse Practitioner

## 2011-03-12 ENCOUNTER — Ambulatory Visit (INDEPENDENT_AMBULATORY_CARE_PROVIDER_SITE_OTHER): Payer: Medicare Other | Admitting: Nurse Practitioner

## 2011-03-12 DIAGNOSIS — I5030 Unspecified diastolic (congestive) heart failure: Secondary | ICD-10-CM

## 2011-03-12 DIAGNOSIS — R609 Edema, unspecified: Secondary | ICD-10-CM

## 2011-03-12 DIAGNOSIS — I5032 Chronic diastolic (congestive) heart failure: Secondary | ICD-10-CM | POA: Insufficient documentation

## 2011-03-12 DIAGNOSIS — R0609 Other forms of dyspnea: Secondary | ICD-10-CM

## 2011-03-12 LAB — BASIC METABOLIC PANEL
BUN: 58 mg/dL — ABNORMAL HIGH (ref 6–23)
CO2: 21 mEq/L (ref 19–32)
Calcium: 9.8 mg/dL (ref 8.4–10.5)
Chloride: 103 mEq/L (ref 96–112)
Creatinine, Ser: 2.1 mg/dL — ABNORMAL HIGH (ref 0.4–1.2)
GFR: 23.6 mL/min — ABNORMAL LOW (ref >60.00)
Glucose, Bld: 121 mg/dL — ABNORMAL HIGH (ref 70–99)
Potassium: 5.4 mEq/L — ABNORMAL HIGH (ref 3.5–5.1)
Sodium: 136 mEq/L (ref 135–145)

## 2011-03-12 LAB — BRAIN NATRIURETIC PEPTIDE: Pro B Natriuretic peptide (BNP): 78 pg/mL (ref 0.0–100.0)

## 2011-03-12 NOTE — Assessment & Plan Note (Signed)
She has a normal EF with elevated filling pressures. She is on diuretic therapy. Weight is stable. No evidence of swelling. Will check BMET and BNP today. I suspect her shortness of breath is multifactorial (weight, diastolic heart failure, deconditioning, anemia, etc). We will see her back in a month. She does seem to be more limited by this generalized weakness. I have encouraged her to touch base with Dr. Tenny Craw, especially with her recent fall. Patient is agreeable to this plan and will call if any problems develop in the interim.

## 2011-03-12 NOTE — Progress Notes (Signed)
[name] Date of Birth: 1927-12-04 Medical Record #[id number]  History of Present Illness: Ms. Nason is seen today for a post hospital visit. She is seen for Dr. [place]. She is a former patient of Dr. Ronnald Nian. She is an 78 year old female with recent acute on chronic diastolic heart failure. She was hospitalized for 10 days with DOE and edema - felt to have heart failure and bronchitis. She did have low oxygen saturations and treated with nebulizers and oxygen therapy. She sees Dr. Tenny Craw for primary care.  Review of her past paper chart shows chronic issues with swelling and DOE. She has chronic weakness and is deconditioned. She does not have a history of known CAD. It was Dr. Ronnald Nian feeling that she may need nursing home placement back in 2011 but has remained at home. Her husband is her primary caregiver. She is primarily wheelchair bound. She was noted to be anemic with a hemoglobin of 9.6 back in 2011, now up to almost 11.  Most recent echo earlier this month showed mild LVH, EF of 60% with high ventricular filling pressures. There was concern for atrial fib in the hospital. She is not felt to be a candidate for anticoagulation.   She comes in today. She remains weak - but this is chronic. She did fall over the weekend. Her walker slipped out from her. She hit her tailbone and her right arm. She did not get dizzy and pass out. No chest pain but does has what she describes as lower sternal "spasms" and uses NTG. Her swelling is improved. She says her breathing is ok.    Current Outpatient Prescriptions on File Prior to Visit  Medication Sig Dispense Refill  . albuterol-ipratropium (COMBIVENT) 18-103 MCG/ACT inhaler Inhale 2 puffs into the lungs every 6 (six) hours as needed for wheezing.  1 Inhaler  6  . amLODipine (NORVASC) 5 MG tablet Take 1 tablet (5 mg total) by mouth daily.  30 tablet  6  . aspirin EC 81 MG tablet Take 81 mg by mouth daily.        . carvedilol (COREG) 25 MG  tablet Take 25 mg by mouth 2 (two) times daily with a meal.        . dorzolamide (TRUSOPT) 2 % ophthalmic solution Place 1 drop into both eyes 2 (two) times daily.  10 mL  6  . ferrous sulfate 325 (65 FE) MG tablet Take 325 mg by mouth every other day.        Marland Kitchen FLUoxetine (PROZAC) 20 MG capsule Take 20 mg by mouth daily at 12 noon.        . furosemide (LASIX) 40 MG tablet Take 40 mg by mouth daily.        Marland Kitchen levothyroxine (SYNTHROID, LEVOTHROID) 75 MCG tablet Take 75 mcg by mouth daily.        Marland Kitchen LORazepam (ATIVAN) 2 MG tablet Take 2 mg by mouth every 8 (eight) hours as needed. anxiety       . Multiple Vitamins-Minerals (ICAPS PO) Take 1 tablet by mouth daily.        . nitroGLYCERIN (NITROSTAT) 0.4 MG SL tablet Place 1 tablet (0.4 mg total) under the tongue every 5 (five) minutes as needed for chest pain.  30 tablet  3  . OLANZapine (ZYPREXA) 5 MG tablet Take 5 mg by mouth daily at 12 noon.        . pantoprazole (PROTONIX) 40 MG tablet Take 40 mg by mouth  daily. 1 hour before breakfast       . potassium chloride SA (K-DUR,KLOR-CON) 20 MEQ tablet Take 20 mEq by mouth daily.        Marland Kitchen telmisartan-hydrochlorothiazide (MICARDIS HCT) 80-12.5 MG per tablet Take 1 tablet by mouth daily.        . traMADol (ULTRAM) 50 MG tablet Take 50 mg by mouth every 6 (six) hours as needed. Pain          Allergies  Allergen Reactions  . Penicillins Swelling and Rash    Past Medical History  Diagnosis Date  . Hypertension   . CHF (congestive heart failure)     felt to have diastolic dysfunction with normal EF at 60%  . GERD (gastroesophageal reflux disease)   . Glaucoma   . Osteoarthritis   . Fibromyalgia   . Hyperlipidemia   . Anxiety   . Depression   . Bronchitis 02/22/2011  . Chronic pain   . Hypothyroidism     Past Surgical History  Procedure Date  . Hemorrhoid surgery   . Appendectomy     as a teenage  . Laparoscopic hysterectomy   . Knee surgery     bilateral  . Cholecystectomy     pt  denies having cholecystectomy  . Cesarean section   . Transthoracic echocardiogram 02/2011    EF 60% with diastolic dysfunction, high filling pressures    History  Smoking status  . Never Smoker   Smokeless tobacco  . Never Used    History  Alcohol Use No    Family History  Problem Relation Age of Onset  . Heart failure Mother   . Stroke Father   . Lymphoma Brother     Review of Systems: The review of systems is positive for chronic weakness. She has had a recent fall. No evaluation sought. She has been using support stockings for her swelling in the past.  All other systems were reviewed and are negative.  Physical Exam: BP 118/60  Pulse 82  Ht 5\' 10"  (1.778 m)  Wt 229 lb (103.874 kg)  BMI 32.86 kg/m2 Patient appears chronically ill but in no acute distress. She is in a wheelchair. Skin is warm and dry. Color is normal.  HEENT is unremarkable. Normocephalic/atraumatic. PERRL. Sclera are nonicteric. Neck is supple. No masses. No JVD. Lungs are fairly clear. Cardiac exam shows a regular rate and rhythm. Abdomen is obese but soft. Extremities are full but without edema. Gait is not tested. Appears weak. ROM is intact. No gross neurologic deficits noted.   Lab Results  Component Value Date   WBC 8.9 02/24/2011   HGB 10.8* 02/24/2011   HCT 34.7* 02/24/2011   PLT 245 02/24/2011   GLUCOSE 100* 03/04/2011   ALT 25 06/10/2007   AST 29 06/10/2007   NA 139 03/04/2011   K 4.6 03/04/2011   CL 104 03/04/2011   CREATININE 1.06 03/04/2011   BUN 34* 03/04/2011   CO2 21 03/04/2011   INR 1.8* 06/19/2007   HGBA1C 6.0* 02/22/2011    EKG today shows sinus rhythm. She has inferior Q's and poor R wave progression.   Assessment / Plan:

## 2011-03-12 NOTE — Patient Instructions (Signed)
I think you are doing ok.  I would encourage you to follow up with Dr. Tenny Craw  I will see you in a month. We are going to check some labs today. Continue to minimize your salt and use your support stockings.  Call the [facility] office at [phone] if you have any questions, problems or concerns.

## 2011-03-12 NOTE — Assessment & Plan Note (Signed)
This has been a chronic issue. No evidence of swelling today. She is on lasix. She is to continue with support stockings, her current medicines and salt restriction. We will check a BMET today. Patient is agreeable to this plan and will call if any problems develop in the interim.

## 2011-03-19 ENCOUNTER — Telehealth: Payer: Self-pay | Admitting: Nurse Practitioner

## 2011-03-19 ENCOUNTER — Encounter: Payer: Self-pay | Admitting: Internal Medicine

## 2011-03-19 ENCOUNTER — Ambulatory Visit (INDEPENDENT_AMBULATORY_CARE_PROVIDER_SITE_OTHER): Payer: Medicare Other | Admitting: Internal Medicine

## 2011-03-19 VITALS — BP 140/72 | HR 93 | Ht 70.0 in | Wt 224.8 lb

## 2011-03-19 DIAGNOSIS — J209 Acute bronchitis, unspecified: Secondary | ICD-10-CM

## 2011-03-19 NOTE — Assessment & Plan Note (Signed)
Most of her weakness and shortness of breath have been cardiogenic. Now that she is closer to dry weight, we have an opportunity to check PFTs for clarification of pulmonary status. She has never smoked and has no history of diagnosed previous lung disease, so lung function may be normal. I suggested she asks about cardiac rehabilitation. We discussed her Combivent inhaler, sig jesting she uses only if she feels tight or wheezy. There may be very little bronchospasm.

## 2011-03-19 NOTE — Telephone Encounter (Signed)
Please call to advised if this needs to go to her pcp, which is dr. Miguel Aschoff

## 2011-03-19 NOTE — Telephone Encounter (Signed)
[name] from [facility] called,was told to have patient follow up with PCP Dr.Alan Ross.

## 2011-03-19 NOTE — Progress Notes (Signed)
03/19/11- 22 yoF never smoker, wife of my patient, coming for post-hospital visit to evaluate for pulmonary component of dyspnea. Husband here. PCP- Dr Thomasene Mohair. She was hospitalized January 3 through 03/04/2011 with final diagnoses: Acute on chronic diastolic CHF, bronchitis, new onset atrial flutter the, renal insufficiency, CAD, GERD, hypertension. DC summary reviewed. Echocardiogram showed mild right ventricular dilatation, EF 60% with mild RV systolic function reduced, peak PA pressure 33. She had responded to diuresis with a 16 pound weight loss. She converted spontaneously and was not discharged on anticoagulants. She had presented with marked dyspnea. Never smoked. Since discharge she says her breathing is pretty good with some residual dry cough but she is markedly weak. She does not distinguish muscle weakness from shortness of breath but is comfortable at rest. She uses a walker at home and labors to walk from room to room. She was given a Combivent rescue inhaler which she has used intermittently with little effect. She has no previous history of lung disease including pneumonia or asthma but does recognize some history of seasonal allergic rhinitis. Has had flu and pneumococcal vaccines. Married living with husband and retired from [employer].  ROS-see HPI Constitutional:   Diuretic  weight loss,  No-night sweats, fevers, chills, fatigue, lassitude. HEENT:   No-  headaches, difficulty swallowing, tooth/dental problems, sore throat,       No-  sneezing, itching, ear ache, nasal congestion, post nasal drip,  CV:  No-   chest pain, orthopnea, PND, swelling in lower extremities, anasarca, dizziness, palpitations Resp: + shortness of breath with exertion or at rest.              Scant  productive cough,  No non-productive cough,  No- coughing up of blood.              No-   change in color of mucus.  No- wheezing.   Skin: No-   rash or lesions. GI:  No-   heartburn, indigestion,  abdominal pain, nausea, vomiting, diarrhea,                 change in bowel habits, loss of appetite GU:  MS:  No-   joint pain or swelling.  No- decreased range of motion.  No- back pain. Neuro-     nothing unusual Psych:  No- change in mood or affect. No depression or anxiety.  No memory loss.  OBJ- Physical Exam General- Alert, Oriented, Affect-appropriate, Distress- none acute, weak, obese, wheelchair Skin- echymoses arms, lesions- none, excoriation- none Lymphadenopathy- none Head- atraumatic            Eyes- Gross vision intact, PERRLA, conjunctivae and secretions clear            Ears- Hearing, canals-normal            Nose- Clear, no-Septal dev, mucus, polyps, erosion, perforation             Throat- Mallampati II , mucosa clear , drainage- none, tonsils- atrophic Neck- flexible , trachea midline, no stridor , thyroid nl, carotid no bruit Chest - symmetrical excursion , unlabored           Heart/CV- RRR , no murmur , no gallop  , no rub, nl s1 s2                           - JVD- none , edema- 1+, stasis changes- none, varices- none  Lung- clear to P&A, wheeze- none, cough- none , dullness-none, rub- none           Chest wall-  Abd- tender-no, distended-no, bowel sounds-present, HSM- no Br/ Gen/ Rectal- Not done, not indicated Extrem- cyanosis- none, clubbing, none, atrophy- none, strength- nl Neuro- grossly intact to observation

## 2011-03-19 NOTE — Telephone Encounter (Signed)
New problem [facility] needs order to continue care this week please call her back

## 2011-03-19 NOTE — Patient Instructions (Addendum)
Continue using the Combivent rescue inhaler  2 puffs, up to 4 times daily IF NEEDED  You can ask at your cardiology visit, whether you might be a candidate for Cardiac Rehabilitation  Order- schedule PFT

## 2011-03-21 ENCOUNTER — Other Ambulatory Visit: Payer: Self-pay | Admitting: *Deleted

## 2011-03-26 ENCOUNTER — Other Ambulatory Visit: Payer: Medicare Other | Admitting: *Deleted

## 2011-03-28 ENCOUNTER — Ambulatory Visit (INDEPENDENT_AMBULATORY_CARE_PROVIDER_SITE_OTHER): Payer: Medicare Other | Admitting: *Deleted

## 2011-03-28 DIAGNOSIS — I5030 Unspecified diastolic (congestive) heart failure: Secondary | ICD-10-CM

## 2011-03-28 DIAGNOSIS — R609 Edema, unspecified: Secondary | ICD-10-CM

## 2011-03-28 DIAGNOSIS — I5033 Acute on chronic diastolic (congestive) heart failure: Secondary | ICD-10-CM

## 2011-03-28 DIAGNOSIS — J209 Acute bronchitis, unspecified: Secondary | ICD-10-CM

## 2011-03-28 LAB — BASIC METABOLIC PANEL
Calcium: 9.6 mg/dL (ref 8.4–10.5)
Creatinine, Ser: 2.3 mg/dL — ABNORMAL HIGH (ref 0.4–1.2)

## 2011-03-29 ENCOUNTER — Ambulatory Visit (INDEPENDENT_AMBULATORY_CARE_PROVIDER_SITE_OTHER): Payer: Medicare Other | Admitting: Internal Medicine

## 2011-03-29 DIAGNOSIS — J209 Acute bronchitis, unspecified: Secondary | ICD-10-CM

## 2011-03-29 LAB — PULMONARY FUNCTION TEST

## 2011-03-29 NOTE — Progress Notes (Signed)
PFT done today. 

## 2011-04-03 ENCOUNTER — Telehealth: Payer: Self-pay | Admitting: Nurse Practitioner

## 2011-04-03 NOTE — Telephone Encounter (Signed)
New Problem:     Patient's husband called in because Lawson Fiscal called and left instructions for his wife to schedule some lab work for this Friday (which he preferred to schedule for this Thursday) and the two medications that his wife was supposed to stop taking (which he claims that she never specified which ones). Please call back.

## 2011-04-03 NOTE — Progress Notes (Signed)
Called this am but no answer. Message left for patient to call back. Please send copy of my last note and this lab to her PCP. I suspect she will need referral to nephrology.

## 2011-04-03 NOTE — Telephone Encounter (Signed)
Advised her that her kidney function has gotten worse, and Dr.Allred would like for her to stop her lasix, and micardis until she recheck her bmet on Thursday. i told her as well not to take anything with aspirin in it.

## 2011-04-05 ENCOUNTER — Ambulatory Visit (INDEPENDENT_AMBULATORY_CARE_PROVIDER_SITE_OTHER): Payer: Medicare Other | Admitting: *Deleted

## 2011-04-05 DIAGNOSIS — I5033 Acute on chronic diastolic (congestive) heart failure: Secondary | ICD-10-CM

## 2011-04-05 DIAGNOSIS — R609 Edema, unspecified: Secondary | ICD-10-CM

## 2011-04-05 DIAGNOSIS — I5030 Unspecified diastolic (congestive) heart failure: Secondary | ICD-10-CM

## 2011-04-05 LAB — BASIC METABOLIC PANEL
BUN: 47 mg/dL — ABNORMAL HIGH (ref 6–23)
CO2: 22 mEq/L (ref 19–32)
Calcium: 10 mg/dL (ref 8.4–10.5)
Chloride: 107 mEq/L (ref 96–112)
Creatinine, Ser: 1.9 mg/dL — ABNORMAL HIGH (ref 0.4–1.2)
GFR: 27.76 mL/min — ABNORMAL LOW (ref >60.00)
Glucose, Bld: 109 mg/dL — ABNORMAL HIGH (ref 70–99)
Potassium: 4.5 mEq/L (ref 3.5–5.1)
Sodium: 139 mEq/L (ref 135–145)

## 2011-04-09 ENCOUNTER — Ambulatory Visit (INDEPENDENT_AMBULATORY_CARE_PROVIDER_SITE_OTHER): Payer: Medicare Other | Admitting: Internal Medicine

## 2011-04-09 ENCOUNTER — Encounter: Payer: Self-pay | Admitting: Internal Medicine

## 2011-04-09 VITALS — BP 124/62 | HR 92 | Ht 70.0 in | Wt 222.6 lb

## 2011-04-09 DIAGNOSIS — R0600 Dyspnea, unspecified: Secondary | ICD-10-CM

## 2011-04-09 DIAGNOSIS — J209 Acute bronchitis, unspecified: Secondary | ICD-10-CM

## 2011-04-09 DIAGNOSIS — R0989 Other specified symptoms and signs involving the circulatory and respiratory systems: Secondary | ICD-10-CM

## 2011-04-09 DIAGNOSIS — I5030 Unspecified diastolic (congestive) heart failure: Secondary | ICD-10-CM

## 2011-04-09 NOTE — Progress Notes (Signed)
03/19/11- 24 yoF never smoker, wife of my patient, coming for post-hospital visit to evaluate for pulmonary component of dyspnea. Husband here. PCP- Dr C. Miguel Aschoff. She was hospitalized January 3 through 03/04/2011 with final diagnoses: Acute on chronic diastolic CHF, bronchitis, new onset atrial flutter the, renal insufficiency, CAD, GERD, hypertension. DC summary reviewed. Echocardiogram showed mild right ventricular dilatation, EF 60% with mild RV systolic function reduced, peak PA pressure 33. She had responded to diuresis with a 16 pound weight loss. She converted spontaneously and was not discharged on anticoagulants. She had presented with marked dyspnea. Never smoked. Since discharge she says her breathing is pretty good with some residual dry cough but she is markedly weak. She does not distinguish muscle weakness from shortness of breath but is comfortable at rest. She uses a walker at home and labors to walk from room to room. She was given a Combivent rescue inhaler which she has used intermittently with little effect. She has no previous history of lung disease including pneumonia or asthma but does recognize some history of seasonal allergic rhinitis. Has had flu and pneumococcal vaccines. Married living with husband and retired from [employer].  04/09/11- 10 yoF never smoker,  coming for post-hospital visit to evaluate for pulmonary component of dyspnea. Complicated by bronchitis, diastolic CHF, renal insufficiency.  Husband here Comes to review PFT results; no cough or SOB at this time Feels much improved since last visit with decreased dyspnea. Rarely needs rescue inhaler. Mainly notices dyspnea on exertion but unsteady/week so she doesn't walk much. Understands deconditioning. Has renal insufficiency and is following up with [facility] cardiology. PFT- 03/29/2011-normal spirometry flows with response to bronchodilator, normal lung volumes, diffusion moderately reduced. FEV1 2.22/86%, FEV1/FVC  0.79, TLC 81%, DLCO 49%.  ROS-see HPI Constitutional:   Diuretic  weight loss,  No-night sweats, fevers, chills, fatigue, lassitude. HEENT:   No-  headaches, difficulty swallowing, tooth/dental problems, sore throat,       No-  sneezing, itching, ear ache, nasal congestion, post nasal drip,  CV:  No-   chest pain, orthopnea, PND, swelling in lower extremities, anasarca, dizziness, palpitations Resp: + shortness of breath with exertion or at rest.              Scant  productive cough,  No non-productive cough,  No- coughing up of blood.              No-   change in color of mucus.  No- wheezing.   Skin: No-   rash or lesions. GI:  No-   heartburn, indigestion, abdominal pain, nausea, vomiting, diarrhea,                 change in bowel habits, loss of appetite GU:  MS:  No-   joint pain or swelling.  No- decreased range of motion.  No- back pain. Neuro-     nothing unusual Psych:  No- change in mood or affect. No depression or anxiety.  No memory loss.  OBJ- Physical Exam General- Alert, Oriented, Affect-appropriate, Distress- none acute, weak, obese, wheelchair Skin- echymoses arms, lesions- none, excoriation- none Lymphadenopathy- none Head- atraumatic            Eyes- Gross vision intact, PERRLA, conjunctivae and secretions clear            Ears- Hearing, canals-normal            Nose- Clear, no-Septal dev, mucus, polyps, erosion, perforation  Throat- Mallampati II , mucosa clear , drainage- none, tonsils- atrophic Neck- flexible , trachea midline, no stridor , thyroid nl, carotid no bruit Chest - symmetrical excursion , unlabored           Heart/CV- RRR/ rare extra beat , no murmur , no gallop  , no rub, nl s1 s2                           - JVD- none , edema- , stasis changes- none, varices- none           Lung- clear to P&A, wheeze- none, cough- none , dullness-none, rub- none           Chest wall-  Abd-  Br/ Gen/ Rectal- Not done, not indicated Extrem- cyanosis-  none, clubbing, none, atrophy- none, strength- nl Neuro- grossly intact to observation

## 2011-04-09 NOTE — Patient Instructions (Signed)
Order- ONOX on room air for dx dyspnea, diastolic CHF  Ok to continue using Combivent when needed

## 2011-04-11 NOTE — Assessment & Plan Note (Addendum)
Now controlled with acute pulmonary basis for dyspnea resolved. There is some reversible response to bronchodilator. Plan-overnight oximetry.

## 2011-04-11 NOTE — Assessment & Plan Note (Signed)
Reason known component of heart failure. This is expected to contribute to exertional dyspnea. Pending cardiology evaluation is appropriate.

## 2011-04-18 ENCOUNTER — Encounter: Payer: Self-pay | Admitting: Nurse Practitioner

## 2011-04-18 ENCOUNTER — Ambulatory Visit (INDEPENDENT_AMBULATORY_CARE_PROVIDER_SITE_OTHER): Payer: Medicare Other | Admitting: Nurse Practitioner

## 2011-04-18 ENCOUNTER — Other Ambulatory Visit: Payer: Self-pay | Admitting: *Deleted

## 2011-04-18 ENCOUNTER — Other Ambulatory Visit: Payer: Self-pay | Admitting: Nurse Practitioner

## 2011-04-18 DIAGNOSIS — N184 Chronic kidney disease, stage 4 (severe): Secondary | ICD-10-CM

## 2011-04-18 DIAGNOSIS — I5030 Unspecified diastolic (congestive) heart failure: Secondary | ICD-10-CM

## 2011-04-18 DIAGNOSIS — N289 Disorder of kidney and ureter, unspecified: Secondary | ICD-10-CM

## 2011-04-18 MED ORDER — FUROSEMIDE 40 MG PO TABS: 40.0000 mg | ORAL_TABLET | Freq: Every day | ORAL | Status: DC

## 2011-04-18 NOTE — Progress Notes (Signed)
Addended by: Rosalio Macadamia on: 04/18/2011 05:18 PM   Modules accepted: Orders

## 2011-04-18 NOTE — Assessment & Plan Note (Signed)
We will obtain a renal ultrasound. I have referred her to nephrology. I do not think she is a great candidate for dialysis. I do not get the feeling that her or her husband understand how poor her health is. We are rechecking her labs today as well. I have restarted her Lasix. She will remain off of her ARB. She does not use NSAIDs. We will see her back in about 6 weeks. Patient is agreeable to this plan and will call if any problems develop in the interim.

## 2011-04-18 NOTE — Assessment & Plan Note (Signed)
She is having more swelling. She has been off of her Lasix and ARB. I have restarted the Lasix at 40 mg. We are going to need to address her renal function and formulate a long term plan.

## 2011-04-18 NOTE — Progress Notes (Signed)
Debra Johnston Date of Birth: 01/22/1928 Medical Record #[id number]  History of Present Illness: Debra Johnston is seen back today for a follow up visit. Debra Johnston is seen for Dr. Swaziland. Debra Johnston has seen Dr. Deborah Chalk in the past. Debra Johnston has diastolic heart failure and was recently admitted with an exacerbation along with bronchitis. Debra Johnston has had long standing issues with swelling and DOE. Debra Johnston is chronically weak and deconditioned. Debra Johnston is primarily wheel chair bound. Debra Johnston does fall frequently. Debra Johnston does not have a known history of CAD. Enzymes during this last admission were normal. Debra Johnston was felt to have some atrial fib but is not felt to be a candidate for coumadin anticoagulation. Her echo last month showed an EF of 60% with high filling pressures and mild LVH. Debra Johnston has developed worsening renal function. GFR is 27. Her lasix and Micardis were stopped after her last visit.   Debra Johnston comes in today. Debra Johnston is here with her husband. He provides most of her care. Debra Johnston remains weak. Debra Johnston notes that her legs are swelling again. Debra Johnston thinks her breathing is ok. No falls since her last visit. No chest pain.   Current Outpatient Prescriptions on File Prior to Visit  Medication Sig Dispense Refill  . albuterol-ipratropium (COMBIVENT) 18-103 MCG/ACT inhaler Inhale 2 puffs into the lungs every 6 (six) hours as needed for wheezing.  1 Inhaler  6  . amLODipine (NORVASC) 5 MG tablet Take 1 tablet (5 mg total) by mouth daily.  30 tablet  6  . carvedilol (COREG) 25 MG tablet Take 25 mg by mouth 2 (two) times daily with a meal.        . dorzolamide (TRUSOPT) 2 % ophthalmic solution Place 1 drop into both eyes 2 (two) times daily.  10 mL  6  . ferrous sulfate 325 (65 FE) MG tablet Take 325 mg by mouth every other day.        Marland Kitchen FLUoxetine (PROZAC) 20 MG capsule Take 20 mg by mouth daily at 12 noon.        Marland Kitchen levothyroxine (SYNTHROID, LEVOTHROID) 75 MCG tablet Take 75 mcg by mouth daily.        Marland Kitchen LORazepam (ATIVAN) 2 MG tablet Take 2 mg by mouth  every 8 (eight) hours as needed. anxiety       . Multiple Vitamins-Minerals (ICAPS PO) Take 1 tablet by mouth daily.        . nitroGLYCERIN (NITROSTAT) 0.4 MG SL tablet Place 1 tablet (0.4 mg total) under the tongue every 5 (five) minutes as needed for chest pain.  30 tablet  3  . OLANZapine (ZYPREXA) 5 MG tablet Take 5 mg by mouth daily at 12 noon.        . pantoprazole (PROTONIX) 40 MG tablet Take 40 mg by mouth daily. 1 hour before breakfast         Allergies  Allergen Reactions  . Penicillins Swelling and Rash    Past Medical History  Diagnosis Date  . Hypertension   . CHF (congestive heart failure)     felt to have diastolic dysfunction with normal EF at 60%  . GERD (gastroesophageal reflux disease)   . Glaucoma   . Osteoarthritis   . Fibromyalgia     with chronic weakness  . Hyperlipidemia   . Anxiety   . Depression   . Bronchitis 02/22/2011  . Chronic pain   . Hypothyroidism   . DOE (dyspnea on exertion)     chronic   . CKD (  chronic kidney disease) stage 4, GFR 15-29 ml/min   . Physical deconditioning   . Falls   . PAF (paroxysmal atrial fibrillation)     not felt to be a candidate for coumadin    Past Surgical History  Procedure Date  . Hemorrhoid surgery   . Appendectomy     as a teenage  . Laparoscopic hysterectomy   . Knee surgery     bilateral  . Cholecystectomy     pt denies having cholecystectomy  . Cesarean section   . Transthoracic echocardiogram 02/2011    EF 60% with diastolic dysfunction, high filling pressures    History  Smoking status  . Never Smoker   Smokeless tobacco  . Never Used    History  Alcohol Use No    Family History  Problem Relation Age of Onset  . Heart failure Mother   . Stroke Father   . Lymphoma Brother     Review of Systems: The review of systems is positive for chronic weakness. Now with increasing edema.  All other systems were reviewed and are negative.  Physical Exam: BP 130/72  Pulse 91  Ht 5\' 10"   (1.778 m)  Wt 225 lb (102.059 kg)  BMI 32.28 kg/m2  SpO2 93% Patient is very pleasant and in no acute distress. Debra Johnston does appear chronically ill. Debra Johnston is in a wheelchair. Skin is warm and dry. Color is normal.  HEENT is unremarkable. Normocephalic/atraumatic. PERRL. Sclera are nonicteric. Neck is supple. No masses. No JVD. Lungs are clear. Cardiac exam initially seemed to be irregular. EKG showing sinus however. Abdomen is obese but soft. Extremities are with mild bilateral edema. Gait is not tested. ROM is ntact. No gross neurologic deficits noted.   LABORATORY DATA: Repeat labs today are pending. EKG shows sinus. Debra Johnston does have inferior Q's, poor R wave progression.     Chemistry      Component Value Date/Time   NA 139 04/05/2011 0958   K 4.5 04/05/2011 0958   CL 107 04/05/2011 0958   CO2 22 04/05/2011 0958   BUN 47* 04/05/2011 0958   CREATININE 1.9* 04/05/2011 0958      Component Value Date/Time   CALCIUM 10.0 04/05/2011 0958   ALKPHOS 80 06/10/2007 1139   AST 29 06/10/2007 1139   ALT 25 06/10/2007 1139   BILITOT 0.8 06/10/2007 1139       Assessment / Plan:

## 2011-04-18 NOTE — Patient Instructions (Signed)
We are going to recheck your labs.  We are going to send you to the kidney doctor's to talk about your kidney function.  Limit your salt use. Try to elevate your legs if possible. Try to wear support stockings for your swelling.   You may restart your Lasix. Do not take the Micardis.  We will see you in about 6 weeks with Dr. Swaziland.   Call the [facility] office at [phone] if you have any questions, problems or concerns.

## 2011-04-19 LAB — BASIC METABOLIC PANEL
BUN: 13 mg/dL (ref 6–23)
CO2: 25 mEq/L (ref 19–32)
Calcium: 9.9 mg/dL (ref 8.4–10.5)
Chloride: 108 mEq/L (ref 96–112)
Creatinine, Ser: 1.1 mg/dL (ref 0.4–1.2)
GFR: 53.36 mL/min — ABNORMAL LOW (ref >60.00)
Glucose, Bld: 92 mg/dL (ref 70–99)
Potassium: 4.2 mEq/L (ref 3.5–5.1)
Sodium: 140 mEq/L (ref 135–145)

## 2011-04-19 LAB — CBC WITH DIFFERENTIAL/PLATELET
Basophils Absolute: 0.1 10*3/uL (ref 0.0–0.1)
Basophils Relative: 1.2 % (ref 0.0–3.0)
Eosinophils Absolute: 0.4 10*3/uL (ref 0.0–0.7)
Eosinophils Relative: 4.3 % (ref 0.0–5.0)
HCT: 33.8 % — ABNORMAL LOW (ref 36.0–46.0)
Hemoglobin: 11.6 g/dL — ABNORMAL LOW (ref 12.0–15.0)
Lymphocytes Relative: 24.2 % (ref 12.0–46.0)
Lymphs Abs: 2.4 10*3/uL (ref 0.7–4.0)
MCHC: 34.2 g/dL (ref 30.0–36.0)
MCV: 94.3 fl (ref 78.0–100.0)
Monocytes Absolute: 0.5 10*3/uL (ref 0.1–1.0)
Monocytes Relative: 5.3 % (ref 3.0–12.0)
Neutro Abs: 6.4 10*3/uL (ref 1.4–7.7)
Neutrophils Relative %: 65 % (ref 43.0–77.0)
Platelets: 376 10*3/uL (ref 150.0–400.0)
RBC: 3.59 Mil/uL — ABNORMAL LOW (ref 3.87–5.11)
RDW: 14.2 % (ref 11.5–14.6)
WBC: 9.9 10*3/uL (ref 4.5–10.5)

## 2011-04-20 ENCOUNTER — Other Ambulatory Visit: Payer: Self-pay

## 2011-04-20 DIAGNOSIS — I5030 Unspecified diastolic (congestive) heart failure: Secondary | ICD-10-CM

## 2011-04-23 ENCOUNTER — Telehealth: Payer: Self-pay | Admitting: Cardiology

## 2011-04-23 NOTE — Telephone Encounter (Signed)
New Problem:    Called in wondering if the patient's mycardis had been stopped. Please call back.

## 2011-04-23 NOTE — Telephone Encounter (Signed)
Larita Fife from [facility] was called back and told patient was told on 04/18/11 not to take micardis.

## 2011-04-27 ENCOUNTER — Other Ambulatory Visit (INDEPENDENT_AMBULATORY_CARE_PROVIDER_SITE_OTHER): Payer: Medicare Other

## 2011-04-27 DIAGNOSIS — I5030 Unspecified diastolic (congestive) heart failure: Secondary | ICD-10-CM

## 2011-04-27 LAB — BASIC METABOLIC PANEL
BUN: 15 mg/dL (ref 6–23)
CO2: 24 mEq/L (ref 19–32)
Calcium: 9.7 mg/dL (ref 8.4–10.5)
Chloride: 105 mEq/L (ref 96–112)
Creatinine, Ser: 1.1 mg/dL (ref 0.4–1.2)
GFR: 49.02 mL/min — ABNORMAL LOW (ref >60.00)
Glucose, Bld: 135 mg/dL — ABNORMAL HIGH (ref 70–99)
Potassium: 3.5 mEq/L (ref 3.5–5.1)
Sodium: 139 mEq/L (ref 135–145)

## 2011-05-03 ENCOUNTER — Encounter: Payer: Self-pay | Admitting: Internal Medicine

## 2011-05-09 ENCOUNTER — Ambulatory Visit (HOSPITAL_COMMUNITY)
Admission: RE | Admit: 2011-05-09 | Discharge: 2011-05-09 | Disposition: A | Discharge status: Home / Self Care | Payer: Medicare Other | Source: Ambulatory Visit | Attending: Nurse Practitioner | Admitting: Nurse Practitioner

## 2011-05-09 DIAGNOSIS — N184 Chronic kidney disease, stage 4 (severe): Secondary | ICD-10-CM | POA: Insufficient documentation

## 2011-05-30 ENCOUNTER — Ambulatory Visit: Payer: Self-pay | Admitting: Cardiology

## 2011-06-18 ENCOUNTER — Emergency Department (HOSPITAL_COMMUNITY): Payer: Medicare Other

## 2011-06-18 ENCOUNTER — Inpatient Hospital Stay (HOSPITAL_COMMUNITY)
Admission: EM | Admit: 2011-06-18 | Discharge: 2011-06-26 | DRG: 292 | Disposition: A | Discharge status: Home / Self Care | Payer: Medicare Other | Attending: Internal Medicine | Admitting: Internal Medicine

## 2011-06-18 DIAGNOSIS — I509 Heart failure, unspecified: Secondary | ICD-10-CM | POA: Diagnosis present

## 2011-06-18 DIAGNOSIS — I5033 Acute on chronic diastolic (congestive) heart failure: Secondary | ICD-10-CM | POA: Diagnosis present

## 2011-06-18 DIAGNOSIS — K5900 Constipation, unspecified: Secondary | ICD-10-CM | POA: Diagnosis present

## 2011-06-18 DIAGNOSIS — N184 Chronic kidney disease, stage 4 (severe): Secondary | ICD-10-CM | POA: Diagnosis present

## 2011-06-18 DIAGNOSIS — K219 Gastro-esophageal reflux disease without esophagitis: Secondary | ICD-10-CM | POA: Diagnosis present

## 2011-06-18 DIAGNOSIS — R5381 Other malaise: Secondary | ICD-10-CM | POA: Diagnosis present

## 2011-06-18 DIAGNOSIS — I5031 Acute diastolic (congestive) heart failure: Principal | ICD-10-CM | POA: Diagnosis present

## 2011-06-18 DIAGNOSIS — J209 Acute bronchitis, unspecified: Secondary | ICD-10-CM | POA: Diagnosis present

## 2011-06-18 DIAGNOSIS — R0600 Dyspnea, unspecified: Secondary | ICD-10-CM

## 2011-06-18 DIAGNOSIS — E039 Hypothyroidism, unspecified: Secondary | ICD-10-CM | POA: Diagnosis present

## 2011-06-18 DIAGNOSIS — I480 Paroxysmal atrial fibrillation: Secondary | ICD-10-CM | POA: Diagnosis present

## 2011-06-18 DIAGNOSIS — I4891 Unspecified atrial fibrillation: Secondary | ICD-10-CM | POA: Diagnosis present

## 2011-06-18 DIAGNOSIS — IMO0001 Reserved for inherently not codable concepts without codable children: Secondary | ICD-10-CM | POA: Diagnosis present

## 2011-06-18 LAB — BASIC METABOLIC PANEL
CO2: 23 mEq/L (ref 19–32)
Chloride: 103 mEq/L (ref 96–112)
Creatinine, Ser: 0.86 mg/dL (ref 0.50–1.10)
Potassium: 3.3 mEq/L — ABNORMAL LOW (ref 3.5–5.1)

## 2011-06-18 LAB — CBC
HCT: 38.2 % (ref 36.0–46.0)
Hemoglobin: 12.4 g/dL (ref 12.0–15.0)
MCH: 29.9 pg (ref 26.0–34.0)
RBC: 4.15 MIL/uL (ref 3.87–5.11)

## 2011-06-18 LAB — DIFFERENTIAL
Eosinophils Relative: 0 % (ref 0–5)
Lymphocytes Relative: 22 % (ref 12–46)
Lymphs Abs: 1.6 10*3/uL (ref 0.7–4.0)
Monocytes Relative: 8 % (ref 3–12)
WBC Morphology: INCREASED

## 2011-06-18 LAB — PRO B NATRIURETIC PEPTIDE: Pro B Natriuretic peptide (BNP): 913.1 pg/mL — ABNORMAL HIGH (ref 0–450)

## 2011-06-18 MED ORDER — MOXIFLOXACIN HCL 400 MG PO TABS
400.0000 mg | ORAL_TABLET | Freq: Once | ORAL | Status: AC
  Administered 2011-06-19: 400 mg via ORAL
  Filled 2011-06-18: qty 1

## 2011-06-18 MED ORDER — FUROSEMIDE 10 MG/ML IJ SOLN
40.0000 mg | Freq: Once | INTRAMUSCULAR | Status: AC
  Administered 2011-06-19: 40 mg via INTRAVENOUS
  Filled 2011-06-18: qty 4

## 2011-06-18 MED ORDER — POTASSIUM CHLORIDE CRYS ER 20 MEQ PO TBCR
40.0000 meq | EXTENDED_RELEASE_TABLET | Freq: Once | ORAL | Status: AC
  Administered 2011-06-19: 40 meq via ORAL
  Filled 2011-06-18: qty 1
  Filled 2011-06-18: qty 2

## 2011-06-18 MED ORDER — NITROGLYCERIN 2 % TD OINT
1.0000 [in_us] | TOPICAL_OINTMENT | Freq: Once | TRANSDERMAL | Status: AC
  Administered 2011-06-19: 1 [in_us] via TOPICAL
  Filled 2011-06-18: qty 1

## 2011-06-18 MED ORDER — ACETAMINOPHEN 325 MG PO TABS
650.0000 mg | ORAL_TABLET | Freq: Once | ORAL | Status: AC
  Administered 2011-06-18: 650 mg via ORAL
  Filled 2011-06-18: qty 2

## 2011-06-18 NOTE — ED Notes (Addendum)
The patient states that Friday of last week she had upper respiratory infection symptoms.  She sought care at a local clinic, and she was prescribed a course of antibiotics for and upper respiratory infection.  The patient took the antibiotics with food in her stomach, however she still became nauseous which caused her to vomit.  She called the provider at the clinic at which time she was advised to stop the antibiotics and to only use her prescribed inhaler.  The patient did this, but the inhaler did not help.

## 2011-06-18 NOTE — ED Provider Notes (Addendum)
History     CSN: [id number]  Arrival date & time 06/18/11  2050   First MD Initiated Contact with Patient 06/18/11 2124      Chief Complaint  Patient presents with  . URI    (Consider location/radiation/quality/duration/timing/severity/associated sxs/prior treatment) HPI Complains of nonproductive cough and shortness of breath onset 3 days ago. Patient seen by an urgent care Center clinic 2 days ago, prescribed an antibiotic(does not recall name.) Vomited the antibiotic. Call the clinic tonight was told to stop the antibiotic and start Combivent inhaler which she has used without relief. Continues to complain of shortness of breath with nonproductive cough. No other associated symptom. Brought by EMS treated with supplemental oxygen in the field denies pain anywhere nothing makes symptoms better or worse Past Medical History  Diagnosis Date  . Hypertension   . CHF (congestive heart failure)     felt to have diastolic dysfunction with normal EF at 60%  . GERD (gastroesophageal reflux disease)   . Glaucoma   . Osteoarthritis   . Fibromyalgia     with chronic weakness  . Hyperlipidemia   . Anxiety   . Depression   . Bronchitis 02/22/2011  . Chronic pain   . Hypothyroidism   . DOE (dyspnea on exertion)     chronic   . CKD (chronic kidney disease) stage 4, GFR 15-29 ml/min   . Physical deconditioning   . Falls   . PAF (paroxysmal atrial fibrillation)     not felt to be a candidate for coumadin    Past Surgical History  Procedure Date  . Hemorrhoid surgery   . Appendectomy     as a teenage  . Laparoscopic hysterectomy   . Knee surgery     bilateral  . Cholecystectomy     pt denies having cholecystectomy  . Cesarean section   . Transthoracic echocardiogram 02/2011    EF 60% with diastolic dysfunction, high filling pressures    Family History  Problem Relation Age of Onset  . Heart failure Mother   . Stroke Father   . Lymphoma Brother     History  Substance Use  Topics  . Smoking status: Never Smoker   . Smokeless tobacco: Never Used  . Alcohol Use: No    OB History    Grav Para Term Preterm Abortions TAB SAB Ect Mult Living                  Review of Systems  Respiratory: Positive for cough and shortness of breath.   Cardiovascular: Positive for leg swelling.       Chronic bilateral leg edema  All other systems reviewed and are negative.    Allergies  Penicillins  Home Medications   Current Outpatient Rx  Name Route Sig Dispense Refill  . IPRATROPIUM-ALBUTEROL 18-103 MCG/ACT IN AERO Inhalation Inhale 2 puffs into the lungs every 6 (six) hours as needed for wheezing. 1 Inhaler 6  . AMLODIPINE BESYLATE 5 MG PO TABS Oral Take 1 tablet (5 mg total) by mouth daily. 30 tablet 6  . CARVEDILOL 25 MG PO TABS Oral Take 25 mg by mouth 2 (two) times daily with a meal.      . DORZOLAMIDE HCL 2 % OP SOLN Both Eyes Place 1 drop into both eyes 2 (two) times daily. 10 mL 6  . FERROUS SULFATE 325 (65 FE) MG PO TABS Oral Take 325 mg by mouth every other day.      Marland Kitchen FLUOXETINE HCL 20  MG PO CAPS Oral Take 20 mg by mouth daily at 12 noon.      . FUROSEMIDE 40 MG PO TABS Oral Take 1 tablet (40 mg total) by mouth daily. 30 tablet 11  . LEVOTHYROXINE SODIUM 75 MCG PO TABS Oral Take 75 mcg by mouth daily.      Marland Kitchen LORAZEPAM 2 MG PO TABS Oral Take 2 mg by mouth every 8 (eight) hours as needed. anxiety     . ICAPS PO Oral Take 1 tablet by mouth daily.      Marland Kitchen NITROGLYCERIN 0.4 MG SL SUBL Sublingual Place 1 tablet (0.4 mg total) under the tongue every 5 (five) minutes as needed for chest pain. 30 tablet 3  . OLANZAPINE 5 MG PO TABS Oral Take 5 mg by mouth daily at 12 noon.      Marland Kitchen PANTOPRAZOLE SODIUM 40 MG PO TBEC Oral Take 40 mg by mouth daily. 1 hour before breakfast       BP 145/59  Pulse 76  Temp(Src) 100.4 F (38 C) (Oral)  Resp 16  Ht 5\' 10"  (1.778 m)  Wt 220 lb (99.791 kg)  BMI 31.57 kg/m2  SpO2 98%  Physical Exam  Nursing note and vitals  reviewed. Constitutional: She appears well-developed and well-nourished.  HENT:  Head: Normocephalic and atraumatic.  Eyes: Conjunctivae are normal. Pupils are equal, round, and reactive to light.  Neck: Neck supple. No tracheal deviation present. No thyromegaly present.       No neck vein distention, no bruit  Cardiovascular: Normal rate and regular rhythm.   No murmur heard. Pulmonary/Chest: Effort normal. She has rales.       Rales right base  Abdominal: Soft. Bowel sounds are normal. She exhibits no distension. There is no tenderness.  Musculoskeletal: Normal range of motion. She exhibits edema. She exhibits no tenderness.       Trace pretibial pitting edema bilaterally  Neurological: She is alert. Coordination normal.  Skin: Skin is warm and dry. No rash noted.  Psychiatric: She has a normal mood and affect.    ED Course  Procedures (including critical care time)  Labs Reviewed - No data to display No results found.   No diagnosis found.   Date: 06/18/2011  Rate: 75  Rhythm: normal sinus rhythm  QRS Axis: normal  Intervals: normal  ST/T Wave abnormalities: nonspecific T wave changes  Conduction Disutrbances:none  Narrative Interpretation:   Old EKG Reviewed: Tracing from 03/04/2011 showed atrial fibrillation otherwise no significant change  Results for orders placed during the hospital encounter of 06/18/11  CBC      Component Value Range   WBC 7.2  4.0 - 10.5 (K/uL)   RBC 4.15  3.87 - 5.11 (MIL/uL)   Hemoglobin 12.4  12.0 - 15.0 (g/dL)   HCT 16.1  09.6 - 04.5 (%)   MCV 92.0  78.0 - 100.0 (fL)   MCH 29.9  26.0 - 34.0 (pg)   MCHC 32.5  30.0 - 36.0 (g/dL)   RDW 40.9  81.1 - 91.4 (%)   Platelets 276  150 - 400 (K/uL)  DIFFERENTIAL      Component Value Range   Neutrophils Relative 70  43 - 77 (%)   Lymphocytes Relative 22  12 - 46 (%)   Monocytes Relative 8  3 - 12 (%)   Eosinophils Relative 0  0 - 5 (%)   Basophils Relative 0  0 - 1 (%)   Neutro Abs 5.0  1.7  -  7.7 (K/uL)   Lymphs Abs 1.6  0.7 - 4.0 (K/uL)   Monocytes Absolute 0.6  0.1 - 1.0 (K/uL)   Eosinophils Absolute 0.0  0.0 - 0.7 (K/uL)   Basophils Absolute 0.0  0.0 - 0.1 (K/uL)   WBC Morphology INCREASED BANDS (>20% BANDS)    BASIC METABOLIC PANEL      Component Value Range   Sodium 139  135 - 145 (mEq/L)   Potassium 3.3 (*) 3.5 - 5.1 (mEq/L)   Chloride 103  96 - 112 (mEq/L)   CO2 23  19 - 32 (mEq/L)   Glucose, Bld 118 (*) 70 - 99 (mg/dL)   BUN 7  6 - 23 (mg/dL)   Creatinine, Ser 9.81  0.50 - 1.10 (mg/dL)   Calcium 9.9  8.4 - 19.1 (mg/dL)   GFR calc non Af Amer 62 (*) >90 (mL/min)   GFR calc Af Amer 71 (*) >90 (mL/min)  PRO B NATRIURETIC PEPTIDE      Component Value Range   Pro B Natriuretic peptide (BNP) 913.1 (*) 0 - 450 (pg/mL)   Dg Chest 2 View  06/18/2011  *RADIOLOGY REPORT*  Clinical Data: Cough, shortness of breath  CHEST - 2 VIEW  Comparison: 03/01/2011; 02/22/2011; 07/30/2008  Findings: Grossly unchanged cardiac silhouette and mediastinal contours.  There is persistent mild elevation of the right hemidiaphragm. Mild pulmonary venous congestion.  No new focal airspace opacities.  No pleural effusion or pneumothorax.  Grossly unchanged symmetric biapical pleural parenchymal thickening. Unchanged bones.  IMPRESSION: Mild pulmonary venous congestion without frank evidence of pulmonary edema.  Original Report Authenticated By: Waynard Reeds, M.D.  Spoke with Dr. Toniann Fail , who arranged for admission  MDM  Plan oxygen therapy, diuretics, nitrates, antibiotics, telemetry,, potassium repletion Patient with possible atypical pneumonia versus viral syndrome .May have element of congestive heart  failure in light of elevated BNP Diagnosis #1 febrile illness #2 acute dyspnea #3 hypokalemia       Doug Sou, MD 06/19/11 0005  Doug Sou, MD 06/19/11 4782  Doug Sou, MD 06/19/11 [id number]

## 2011-06-18 NOTE — ED Notes (Signed)
Patient transported to X-ray 

## 2011-06-19 ENCOUNTER — Encounter (HOSPITAL_COMMUNITY): Payer: Self-pay | Admitting: Internal Medicine

## 2011-06-19 DIAGNOSIS — E039 Hypothyroidism, unspecified: Secondary | ICD-10-CM | POA: Diagnosis present

## 2011-06-19 DIAGNOSIS — I480 Paroxysmal atrial fibrillation: Secondary | ICD-10-CM | POA: Diagnosis present

## 2011-06-19 LAB — CBC
Hemoglobin: 11.9 g/dL — ABNORMAL LOW (ref 12.0–15.0)
MCH: 30 pg (ref 26.0–34.0)
MCHC: 32.2 g/dL (ref 30.0–36.0)
MCV: 93.2 fL (ref 78.0–100.0)
RBC: 3.97 MIL/uL (ref 3.87–5.11)

## 2011-06-19 LAB — CARDIAC PANEL(CRET KIN+CKTOT+MB+TROPI)
CK, MB: 3 ng/mL (ref 0.3–4.0)
CK, MB: 3.2 ng/mL (ref 0.3–4.0)
Relative Index: 2.3 (ref 0.0–2.5)
Total CK: 145 U/L (ref 7–177)
Troponin I: <0.3 ng/mL (ref <0.30)

## 2011-06-19 LAB — COMPREHENSIVE METABOLIC PANEL
ALT: 22 U/L (ref 0–35)
AST: 29 U/L (ref 0–37)
Albumin: 3.4 g/dL — ABNORMAL LOW (ref 3.5–5.2)
Calcium: 9.6 mg/dL (ref 8.4–10.5)
Chloride: 105 mEq/L (ref 96–112)
Creatinine, Ser: 0.96 mg/dL (ref 0.50–1.10)
Sodium: 142 mEq/L (ref 135–145)
Total Bilirubin: 0.4 mg/dL (ref 0.3–1.2)

## 2011-06-19 MED ORDER — LORAZEPAM 0.5 MG PO TABS
2.0000 mg | ORAL_TABLET | Freq: Three times a day (TID) | ORAL | Status: DC | PRN
  Administered 2011-06-19 – 2011-06-25 (×7): 2 mg via ORAL
  Filled 2011-06-19 (×5): qty 4
  Filled 2011-06-19: qty 1
  Filled 2011-06-19 (×2): qty 4

## 2011-06-19 MED ORDER — BUDESONIDE 0.5 MG/2ML IN SUSP
0.5000 mg | Freq: Two times a day (BID) | RESPIRATORY_TRACT | Status: DC
  Administered 2011-06-19 – 2011-06-26 (×11): 0.5 mg via RESPIRATORY_TRACT
  Filled 2011-06-19 (×20): qty 2

## 2011-06-19 MED ORDER — AMLODIPINE BESYLATE 5 MG PO TABS
5.0000 mg | ORAL_TABLET | Freq: Every day | ORAL | Status: DC
  Administered 2011-06-19 – 2011-06-26 (×8): 5 mg via ORAL
  Filled 2011-06-19 (×8): qty 1

## 2011-06-19 MED ORDER — MAGNESIUM OXIDE 400 MG PO TABS
200.0000 mg | ORAL_TABLET | Freq: Two times a day (BID) | ORAL | Status: DC
  Administered 2011-06-19 – 2011-06-26 (×15): 200 mg via ORAL
  Filled 2011-06-19 (×16): qty 0.5

## 2011-06-19 MED ORDER — DORZOLAMIDE HCL 2 % OP SOLN
1.0000 [drp] | Freq: Two times a day (BID) | OPHTHALMIC | Status: DC
  Administered 2011-06-19 – 2011-06-25 (×15): 1 [drp] via OPHTHALMIC
  Filled 2011-06-19: qty 10

## 2011-06-19 MED ORDER — LEVOTHYROXINE SODIUM 75 MCG PO TABS
75.0000 ug | ORAL_TABLET | Freq: Every day | ORAL | Status: DC
  Administered 2011-06-19 – 2011-06-26 (×7): 75 ug via ORAL
  Filled 2011-06-19 (×9): qty 1

## 2011-06-19 MED ORDER — OLANZAPINE 5 MG PO TABS
5.0000 mg | ORAL_TABLET | Freq: Every day | ORAL | Status: DC
  Administered 2011-06-19 – 2011-06-25 (×7): 5 mg via ORAL
  Filled 2011-06-19 (×8): qty 1

## 2011-06-19 MED ORDER — FERROUS SULFATE 325 (65 FE) MG PO TABS
325.0000 mg | ORAL_TABLET | ORAL | Status: DC
  Administered 2011-06-19 – 2011-06-25 (×4): 325 mg via ORAL
  Filled 2011-06-19 (×4): qty 1

## 2011-06-19 MED ORDER — ACETAMINOPHEN 325 MG PO TABS
650.0000 mg | ORAL_TABLET | Freq: Four times a day (QID) | ORAL | Status: DC | PRN
  Administered 2011-06-21 – 2011-06-25 (×9): 650 mg via ORAL
  Filled 2011-06-19 (×9): qty 2

## 2011-06-19 MED ORDER — FUROSEMIDE 10 MG/ML IJ SOLN
40.0000 mg | Freq: Every day | INTRAMUSCULAR | Status: DC
  Administered 2011-06-19 – 2011-06-22 (×4): 40 mg via INTRAVENOUS
  Filled 2011-06-19 (×4): qty 4

## 2011-06-19 MED ORDER — ACETAMINOPHEN 325 MG PO TABS
325.0000 mg | ORAL_TABLET | Freq: Once | ORAL | Status: AC
  Administered 2011-06-19: 325 mg via ORAL
  Filled 2011-06-19: qty 1

## 2011-06-19 MED ORDER — ALBUTEROL SULFATE (5 MG/ML) 0.5% IN NEBU
2.5000 mg | INHALATION_SOLUTION | Freq: Four times a day (QID) | RESPIRATORY_TRACT | Status: DC
  Administered 2011-06-19 – 2011-06-20 (×8): 2.5 mg via RESPIRATORY_TRACT
  Filled 2011-06-19 (×8): qty 0.5

## 2011-06-19 MED ORDER — SODIUM CHLORIDE 0.9 % IJ SOLN
3.0000 mL | Freq: Two times a day (BID) | INTRAMUSCULAR | Status: DC
  Administered 2011-06-23 – 2011-06-24 (×2): 3 mL via INTRAVENOUS

## 2011-06-19 MED ORDER — ENOXAPARIN SODIUM 40 MG/0.4ML ~~LOC~~ SOLN
40.0000 mg | SUBCUTANEOUS | Status: DC
  Administered 2011-06-19 – 2011-06-25 (×7): 40 mg via SUBCUTANEOUS
  Filled 2011-06-19 (×8): qty 0.4

## 2011-06-19 MED ORDER — GUAIFENESIN ER 600 MG PO TB12
600.0000 mg | ORAL_TABLET | Freq: Two times a day (BID) | ORAL | Status: DC
  Administered 2011-06-19 – 2011-06-26 (×15): 600 mg via ORAL
  Filled 2011-06-19 (×16): qty 1

## 2011-06-19 MED ORDER — ONDANSETRON HCL 4 MG PO TABS: 4.0000 mg | ORAL_TABLET | Freq: Four times a day (QID) | ORAL | Status: DC | PRN

## 2011-06-19 MED ORDER — FLUOXETINE HCL 20 MG PO CAPS
20.0000 mg | ORAL_CAPSULE | Freq: Every day | ORAL | Status: DC
  Administered 2011-06-19 – 2011-06-25 (×7): 20 mg via ORAL
  Filled 2011-06-19 (×8): qty 1

## 2011-06-19 MED ORDER — ONDANSETRON HCL 4 MG/2ML IJ SOLN: 4.0000 mg | Freq: Four times a day (QID) | INTRAMUSCULAR | Status: DC | PRN

## 2011-06-19 MED ORDER — ACETAMINOPHEN 650 MG RE SUPP: 650.0000 mg | Freq: Four times a day (QID) | RECTAL | Status: DC | PRN

## 2011-06-19 MED ORDER — SODIUM CHLORIDE 0.9 % IJ SOLN
3.0000 mL | Freq: Two times a day (BID) | INTRAMUSCULAR | Status: DC
  Administered 2011-06-19 – 2011-06-25 (×15): 3 mL via INTRAVENOUS

## 2011-06-19 MED ORDER — PANTOPRAZOLE SODIUM 40 MG PO TBEC
40.0000 mg | DELAYED_RELEASE_TABLET | Freq: Every day | ORAL | Status: DC
  Administered 2011-06-19 – 2011-06-25 (×7): 40 mg via ORAL
  Filled 2011-06-19 (×6): qty 1

## 2011-06-19 MED ORDER — GUAIFENESIN 200 MG PO TABS: 400.0000 mg | ORAL_TABLET | Freq: Two times a day (BID) | ORAL | Status: DC

## 2011-06-19 MED ORDER — ACETAMINOPHEN 325 MG PO TABS: 325.0000 mg | ORAL_TABLET | Freq: Once | ORAL | Status: DC

## 2011-06-19 MED ORDER — ALBUTEROL SULFATE (5 MG/ML) 0.5% IN NEBU
2.5000 mg | INHALATION_SOLUTION | RESPIRATORY_TRACT | Status: DC | PRN
  Filled 2011-06-19: qty 0.5

## 2011-06-19 MED ORDER — BIOTENE DRY MOUTH MT LIQD
15.0000 mL | Freq: Two times a day (BID) | OROMUCOSAL | Status: DC
  Administered 2011-06-19 – 2011-06-26 (×11): 15 mL via OROMUCOSAL

## 2011-06-19 MED ORDER — METHYLPREDNISOLONE SODIUM SUCC 40 MG IJ SOLR
40.0000 mg | Freq: Two times a day (BID) | INTRAMUSCULAR | Status: DC
  Administered 2011-06-19 (×2): 40 mg via INTRAVENOUS
  Filled 2011-06-19 (×4): qty 1

## 2011-06-19 MED ORDER — MOXIFLOXACIN HCL 400 MG PO TABS
400.0000 mg | ORAL_TABLET | Freq: Every day | ORAL | Status: DC
  Administered 2011-06-19 – 2011-06-24 (×6): 400 mg via ORAL
  Filled 2011-06-19 (×7): qty 1

## 2011-06-19 MED ORDER — ONDANSETRON HCL 4 MG/2ML IJ SOLN
4.0000 mg | Freq: Once | INTRAMUSCULAR | Status: AC
  Administered 2011-06-19: 4 mg via INTRAVENOUS
  Filled 2011-06-19: qty 2

## 2011-06-19 MED ORDER — CARVEDILOL 25 MG PO TABS
25.0000 mg | ORAL_TABLET | Freq: Two times a day (BID) | ORAL | Status: DC
  Administered 2011-06-19 – 2011-06-26 (×15): 25 mg via ORAL
  Filled 2011-06-19 (×17): qty 1

## 2011-06-19 MED ORDER — NITROGLYCERIN 0.4 MG SL SUBL: 0.4000 mg | SUBLINGUAL_TABLET | SUBLINGUAL | Status: DC | PRN

## 2011-06-19 NOTE — Progress Notes (Signed)
Utilization Review Completed.Jaelynne Hockley T4/30/2013   

## 2011-06-19 NOTE — ED Notes (Signed)
Called and gave report to Bruin.

## 2011-06-19 NOTE — H&P (Signed)
Debra Johnston is an 78 y.o. female.   PCP - Phill Mutter. Chief Complaint: Shortness of breath. HPI: 78 year-old female with known history of diastolic CHF last EF measured in January 2013 was 60% with RV dilatation, presented to the ER because of ongoing shortness of breath with cough. Patient's symptoms started 3 days ago and had gone to urgent care 2 days ago and was prescribed antibiotics names of which she is not able to recall. After having antibiotic he threw up twice. And patient showed up in the ER. Patient's shortness of breath worsens on exertion and has been having nonproductive cough. Patient has had subjective feeling of fever chills and in the ER patient was found to have a temperature of 100F with chest x-ray showing venous congestion. Patient's BNP is found to be elevated. Patient will be admitted for further management. At this time patient has received 40 of Lasix thru IV and Avelox by mouth. Patient at this time is able to take by mouth and states her nausea and vomiting has largely subsided. Denies any abdominal pain or diarrhea.  Past Medical History  Diagnosis Date  . Hypertension   . CHF (congestive heart failure)     felt to have diastolic dysfunction with normal EF at 60%  . GERD (gastroesophageal reflux disease)   . Glaucoma   . Osteoarthritis   . Fibromyalgia     with chronic weakness  . Hyperlipidemia   . Anxiety   . Depression   . Bronchitis 02/22/2011  . Chronic pain   . Hypothyroidism   . DOE (dyspnea on exertion)     chronic   . CKD (chronic kidney disease) stage 4, GFR 15-29 ml/min   . Physical deconditioning   . Falls   . PAF (paroxysmal atrial fibrillation)     not felt to be a candidate for coumadin    Past Surgical History  Procedure Date  . Hemorrhoid surgery   . Appendectomy     as a teenage  . Laparoscopic hysterectomy   . Knee surgery     bilateral  . Cholecystectomy     pt denies having cholecystectomy  . Cesarean section   .  Transthoracic echocardiogram 02/2011    EF 60% with diastolic dysfunction, high filling pressures    Family History  Problem Relation Age of Onset  . Heart failure Mother   . Stroke Father   . Lymphoma Brother    Social History:  reports that she has never smoked. She has never used smokeless tobacco. She reports that she does not drink alcohol or use illicit drugs.  Allergies:  Allergies  Allergen Reactions  . Penicillins Swelling and Rash     (Not in a hospital admission)  Results for orders placed during the hospital encounter of 06/18/11 (from the past 48 hour(s))  CBC     Status: Normal   Collection Time   06/18/11  9:35 PM      Component Value Range Comment   WBC 7.2  4.0 - 10.5 (K/uL)    RBC 4.15  3.87 - 5.11 (MIL/uL)    Hemoglobin 12.4  12.0 - 15.0 (g/dL)    HCT 16.1  09.6 - 04.5 (%)    MCV 92.0  78.0 - 100.0 (fL)    MCH 29.9  26.0 - 34.0 (pg)    MCHC 32.5  30.0 - 36.0 (g/dL)    RDW 40.9  81.1 - 91.4 (%)    Platelets 276  150 - 400 (  K/uL)   DIFFERENTIAL     Status: Normal   Collection Time   06/18/11  9:35 PM      Component Value Range Comment   Neutrophils Relative 70  43 - 77 (%)    Lymphocytes Relative 22  12 - 46 (%)    Monocytes Relative 8  3 - 12 (%)    Eosinophils Relative 0  0 - 5 (%)    Basophils Relative 0  0 - 1 (%)    Neutro Abs 5.0  1.7 - 7.7 (K/uL)    Lymphs Abs 1.6  0.7 - 4.0 (K/uL)    Monocytes Absolute 0.6  0.1 - 1.0 (K/uL)    Eosinophils Absolute 0.0  0.0 - 0.7 (K/uL)    Basophils Absolute 0.0  0.0 - 0.1 (K/uL)    WBC Morphology INCREASED BANDS (>20% BANDS)     BASIC METABOLIC PANEL     Status: Abnormal   Collection Time   06/18/11  9:35 PM      Component Value Range Comment   Sodium 139  135 - 145 (mEq/L)    Potassium 3.3 (*) 3.5 - 5.1 (mEq/L)    Chloride 103  96 - 112 (mEq/L)    CO2 23  19 - 32 (mEq/L)    Glucose, Bld 118 (*) 70 - 99 (mg/dL)    BUN 7  6 - 23 (mg/dL)    Creatinine, Ser 1.61  0.50 - 1.10 (mg/dL)    Calcium 9.9  8.4 -  10.5 (mg/dL)    GFR calc non Af Amer 62 (*) >90 (mL/min)    GFR calc Af Amer 71 (*) >90 (mL/min)   PRO B NATRIURETIC PEPTIDE     Status: Abnormal   Collection Time   06/18/11  9:35 PM      Component Value Range Comment   Pro B Natriuretic peptide (BNP) 913.1 (*) 0 - 450 (pg/mL)    Dg Chest 2 View  06/18/2011  *RADIOLOGY REPORT*  Clinical Data: Cough, shortness of breath  CHEST - 2 VIEW  Comparison: 03/01/2011; 02/22/2011; 07/30/2008  Findings: Grossly unchanged cardiac silhouette and mediastinal contours.  There is persistent mild elevation of the right hemidiaphragm. Mild pulmonary venous congestion.  No new focal airspace opacities.  No pleural effusion or pneumothorax.  Grossly unchanged symmetric biapical pleural parenchymal thickening. Unchanged bones.  IMPRESSION: Mild pulmonary venous congestion without frank evidence of pulmonary edema.  Original Report Authenticated By: Waynard Reeds, M.D.    Review of Systems  Constitutional: Negative.   HENT: Negative.   Eyes: Negative.   Respiratory: Positive for cough and shortness of breath.   Cardiovascular: Negative.   Gastrointestinal: Negative.   Genitourinary: Negative.   Skin: Negative.   Neurological: Negative.   Endo/Heme/Allergies: Negative.   Psychiatric/Behavioral: Negative.     Blood pressure 142/66, pulse 81, temperature 100.7 F (38.2 C), temperature source Oral, resp. rate 16, height 5\' 10"  (1.778 m), weight 99.791 kg (220 lb), SpO2 96.00%. Physical Exam  Constitutional: She is oriented to person, place, and time. She appears well-developed and well-nourished. No distress.  HENT:  Head: Normocephalic and atraumatic.  Right Ear: External ear normal.  Left Ear: External ear normal.  Nose: Nose normal.  Mouth/Throat: Oropharynx is clear and moist. No oropharyngeal exudate.  Eyes: Conjunctivae are normal. Pupils are equal, round, and reactive to light. Right eye exhibits no discharge. Left eye exhibits no discharge. No  scleral icterus.  Neck: Normal range of motion. Neck supple.  Cardiovascular:  Normal rate and regular rhythm.   Respiratory: Effort normal. No respiratory distress. She has wheezes. She has no rales.  GI: Soft. Bowel sounds are normal. She exhibits no distension. There is no tenderness. There is no rebound.  Musculoskeletal: Normal range of motion. She exhibits no edema and no tenderness.  Neurological: She is alert and oriented to person, place, and time.       Moves all extremities.  Skin: Skin is warm and dry. She is not diaphoretic.  Psychiatric: Her behavior is normal.     Assessment/Plan #1. Decompensated diastolic CHF last EF measured in January 2013 was 60% - continue with 40 mg IV Lasix daily and closely follow intake output and daily weights. Probably decompensated because of acute bronchitis. #2. Acute bronchitis - patient has bilateral expiratory wheeze. We will continue with Avelox by mouth. And also place patient on albuterol nebulizer and Pulmicort. We'll give one dose of IV steroids. #3. History of paroxysmal atrial fibrillation presently in sinus rhythm and rate controlled - closely monitor in telemetry. #4. Hypothyroidism - check TSH continue Synthroid.  CODE STATUS - full code.  Danetta Prom N. 06/19/2011, 1:05 AM

## 2011-06-19 NOTE — Progress Notes (Addendum)
Subjective: Relates SOB for last 3 days, cough.  Feels SOB is better.  Objective: Filed Vitals:   06/19/11 0239 06/19/11 0521 06/19/11 0759 06/19/11 1044  BP:  133/49  125/55  Pulse:  70    Temp:  98.1 F (36.7 C)    TempSrc:  Oral    Resp:  18    Height:      Weight:      SpO2: 97% 94% 99%    Weight change:   Intake/Output Summary (Last 24 hours) at 06/19/11 1328 Last data filed at 06/19/11 1130  Gross per 24 hour  Intake    243 ml  Output    425 ml  Net   -182 ml    General: Alert, awake, oriented x3, in no acute distress.  HEENT: No bruits, no goiter.  Heart: Regular rate and rhythm, without murmurs, rubs, gallops.  Lungs: Crackles bases, ronchus, expiratory wheezes, , bilateral air movement.  Abdomen: Soft, nontender, nondistended, positive bowel sounds.  Neuro: Grossly intact, nonfocal. Extremities: trace edema.   Lab Results:  [facility] 06/19/11 0631 06/18/11 2135  NA 142 139  K 3.6 3.3*  CL 105 103  CO2 23 23  GLUCOSE 109* 118*  BUN 9 7  CREATININE 0.96 0.86  CALCIUM 9.6 9.9  MG 1.4* --  PHOS -- --    Basename 06/19/11 0631  AST 29  ALT 22  ALKPHOS 48  BILITOT 0.4  PROT 6.3  ALBUMIN 3.4*   No results found for this basename: LIPASE:2,AMYLASE:2 in the last 72 hours  Basename 06/19/11 0631 06/18/11 2135  WBC 6.4 7.2  NEUTROABS -- 5.0  HGB 11.9* 12.4  HCT 37.0 38.2  MCV 93.2 92.0  PLT 242 276    Basename 06/19/11 0631  TSH 1.526  T4TOTAL --  T3FREE --  THYROIDAB --   Micro Results: No results found for this or any previous visit (from the past 240 hour(s)).  Studies/Results: Dg Chest 2 View  06/18/2011  *RADIOLOGY REPORT*  Clinical Data: Cough, shortness of breath  CHEST - 2 VIEW  Comparison: 03/01/2011; 02/22/2011; 07/30/2008  Findings: Grossly unchanged cardiac silhouette and mediastinal contours.  There is persistent mild elevation of the right hemidiaphragm. Mild pulmonary venous congestion.  No new focal airspace opacities.  No  pleural effusion or pneumothorax.  Grossly unchanged symmetric biapical pleural parenchymal thickening. Unchanged bones.  IMPRESSION: Mild pulmonary venous congestion without frank evidence of pulmonary edema.  Original Report Authenticated By: Waynard Reeds, M.D.    Medications: I have reviewed the patient's current medications.   Patient Active Hospital Problem List:  Acute bronchitis (02/22/2011) Continue with nebulizer, avelox. Guaifenesin for cough.  Has some wheezes, will start solumedrol. Continue with Nebulizer treatments.  Acute on chronic diastolic heart failure (02/22/2011) Continue with lasix, monitor renal function.  Cycle cardiac enzymes.  Strict I and O.  PAF (paroxysmal atrial fibrillation) (06/19/2011)  Continue with Coreg * Hypothyroidism (06/19/2011) Continue with Synthroid.   Hypomagnesemia: Replace with oral supplement repeat MG in am.    LOS: 1 day   Pamela Intrieri M.D.  [facility] 06/19/2011, 1:28 PM

## 2011-06-20 DIAGNOSIS — E039 Hypothyroidism, unspecified: Secondary | ICD-10-CM

## 2011-06-20 DIAGNOSIS — I509 Heart failure, unspecified: Secondary | ICD-10-CM

## 2011-06-20 DIAGNOSIS — R05 Cough: Secondary | ICD-10-CM

## 2011-06-20 DIAGNOSIS — J209 Acute bronchitis, unspecified: Secondary | ICD-10-CM

## 2011-06-20 LAB — BASIC METABOLIC PANEL
BUN: 16 mg/dL (ref 6–23)
CO2: 23 mEq/L (ref 19–32)
Calcium: 9.6 mg/dL (ref 8.4–10.5)
Creatinine, Ser: 0.95 mg/dL (ref 0.50–1.10)
GFR calc non Af Amer: 55 mL/min — ABNORMAL LOW (ref >90)
Glucose, Bld: 149 mg/dL — ABNORMAL HIGH (ref 70–99)

## 2011-06-20 LAB — CBC
HCT: 39.2 % (ref 36.0–46.0)
Hemoglobin: 12.5 g/dL (ref 12.0–15.0)
MCH: 29.9 pg (ref 26.0–34.0)
MCHC: 31.9 g/dL (ref 30.0–36.0)
MCV: 93.8 fL (ref 78.0–100.0)
RBC: 4.18 MIL/uL (ref 3.87–5.11)

## 2011-06-20 LAB — CARDIAC PANEL(CRET KIN+CKTOT+MB+TROPI)
Relative Index: 2.9 — ABNORMAL HIGH (ref 0.0–2.5)
Total CK: 126 U/L (ref 7–177)

## 2011-06-20 MED ORDER — PREDNISONE 20 MG PO TABS
40.0000 mg | ORAL_TABLET | Freq: Every day | ORAL | Status: DC
  Administered 2011-06-21 – 2011-06-25 (×5): 40 mg via ORAL
  Filled 2011-06-20 (×6): qty 2

## 2011-06-20 MED ORDER — GUAIFENESIN-DM 100-10 MG/5ML PO SYRP
5.0000 mL | ORAL_SOLUTION | ORAL | Status: DC | PRN
  Administered 2011-06-25: 5 mL via ORAL
  Filled 2011-06-20: qty 5

## 2011-06-20 NOTE — Progress Notes (Signed)
Subjective: Still having a cough No fever or chills   Objective: Vital signs in last 24 hours: Filed Vitals:   06/19/11 2058 06/19/11 2145 06/20/11 0139 06/20/11 0610  BP:  128/71  154/80  Pulse:  93  85  Temp:  97.4 F (36.3 C)  98.3 F (36.8 C)  TempSrc:    Oral  Resp:  20  20  Height:      Weight:    93.5 kg (206 lb 2.1 oz)  SpO2: 92% 93% 93% 94%   Weight change: -6.291 kg (-13 lb 13.9 oz)  Intake/Output Summary (Last 24 hours) at 06/20/11 0842 Last data filed at 06/20/11 0756  Gross per 24 hour  Intake   1163 ml  Output   1625 ml  Net   -462 ml    Physical Exam: General: Awake, Oriented, No acute distress, productive cough HEENT: EOMI. Neck: Supple CV: S1 and S2, rrr Lungs: Coarse BS B/L, expiratory wheezing Abdomen: Soft, Nontender, Nondistended, +bowel sounds. Ext: Good pulses. Trace edema.   Lab Results:  [facility] 06/20/11 0515 06/19/11 0631  NA 139 142  K 4.6 3.6  CL 102 105  CO2 23 23  GLUCOSE 149* 109*  BUN 16 9  CREATININE 0.95 0.96  CALCIUM 9.6 9.6  MG 1.6 1.4*  PHOS -- --    Basename 06/19/11 0631  AST 29  ALT 22  ALKPHOS 48  BILITOT 0.4  PROT 6.3  ALBUMIN 3.4*   No results found for this basename: LIPASE:2,AMYLASE:2 in the last 72 hours  Basename 06/20/11 0515 06/19/11 0631 06/18/11 2135  WBC 5.4 6.4 --  NEUTROABS -- -- 5.0  HGB 12.5 11.9* --  HCT 39.2 37.0 --  MCV 93.8 93.2 --  PLT 269 242 --    Basename 06/20/11 0515 06/19/11 2107 06/19/11 1347  CKTOTAL 126 145 128  CKMB 3.7 3.2 3.0  CKMBINDEX -- -- --  TROPONINI <0.30 <0.30 <0.30   No components found with this basename: POCBNP:3 No results found for this basename: DDIMER:2 in the last 72 hours No results found for this basename: HGBA1C:2 in the last 72 hours No results found for this basename: CHOL:2,HDL:2,LDLCALC:2,TRIG:2,CHOLHDL:2,LDLDIRECT:2 in the last 72 hours  Basename 06/19/11 0631  TSH 1.526  T4TOTAL --  T3FREE --  THYROIDAB --   No results found for  this basename: VITAMINB12:2,FOLATE:2,FERRITIN:2,TIBC:2,IRON:2,RETICCTPCT:2 in the last 72 hours  Micro Results: No results found for this or any previous visit (from the past 240 hour(s)).  Studies/Results: Dg Chest 2 View  06/18/2011  *RADIOLOGY REPORT*  Clinical Data: Cough, shortness of breath  CHEST - 2 VIEW  Comparison: 03/01/2011; 02/22/2011; 07/30/2008  Findings: Grossly unchanged cardiac silhouette and mediastinal contours.  There is persistent mild elevation of the right hemidiaphragm. Mild pulmonary venous congestion.  No new focal airspace opacities.  No pleural effusion or pneumothorax.  Grossly unchanged symmetric biapical pleural parenchymal thickening. Unchanged bones.  IMPRESSION: Mild pulmonary venous congestion without frank evidence of pulmonary edema.  Original Report Authenticated By: Waynard Reeds, M.D.    Medications: I have reviewed the patient's current medications. Scheduled Meds:   . albuterol  2.5 mg Nebulization Q6H  . amLODipine  5 mg Oral Daily  . antiseptic oral rinse  15 mL Mouth Rinse BID  . budesonide  0.5 mg Nebulization BID  . carvedilol  25 mg Oral BID WC  . dorzolamide  1 drop Both Eyes BID  . enoxaparin  40 mg Subcutaneous Q24H  . ferrous sulfate  325 mg Oral QODAY  . FLUoxetine  20 mg Oral Q1200  . furosemide  40 mg Intravenous Daily  . guaiFENesin  600 mg Oral BID  . levothyroxine  75 mcg Oral Q0600  . magnesium oxide  200 mg Oral BID  . moxifloxacin  400 mg Oral q1800  . OLANZapine  5 mg Oral Q1200  . pantoprazole  40 mg Oral Q1200  . predniSONE  40 mg Oral Q breakfast  . sodium chloride  3 mL Intravenous Q12H  . sodium chloride  3 mL Intravenous Q12H  . DISCONTD: guaiFENesin  400 mg Oral BID  . DISCONTD: methylPREDNISolone (SOLU-MEDROL) injection  40 mg Intravenous Q12H   Continuous Infusions:  PRN Meds:.acetaminophen, acetaminophen, albuterol, LORazepam, nitroGLYCERIN, ondansetron (ZOFRAN) IV, ondansetron  Assessment/Plan: Acute  bronchitis (02/22/2011) Continue with nebulizer, avelox. Guaifenesin for cough/robitussin Steroids, Continue with Nebulizer treatments.   Acute on chronic diastolic heart failure (02/22/2011) Continue with lasix, monitor renal function.  Cycle cardiac enzymes.  Strict I and O.   PAF (paroxysmal atrial fibrillation) (06/19/2011) Continue with Coreg   Hypothyroidism (06/19/2011) Continue with Synthroid.   Hypomagnesemia: Replace with oral supplement repeat MG in am  PT eval    LOS: 2 days  Carole Doner, DO 06/20/2011, 8:42 AM

## 2011-06-21 DIAGNOSIS — J209 Acute bronchitis, unspecified: Secondary | ICD-10-CM

## 2011-06-21 DIAGNOSIS — E039 Hypothyroidism, unspecified: Secondary | ICD-10-CM

## 2011-06-21 DIAGNOSIS — R059 Cough, unspecified: Secondary | ICD-10-CM

## 2011-06-21 DIAGNOSIS — R05 Cough: Secondary | ICD-10-CM

## 2011-06-21 DIAGNOSIS — I509 Heart failure, unspecified: Secondary | ICD-10-CM

## 2011-06-21 MED ORDER — BISACODYL 5 MG PO TBEC
5.0000 mg | DELAYED_RELEASE_TABLET | Freq: Every day | ORAL | Status: DC | PRN
  Administered 2011-06-21: 5 mg via ORAL
  Filled 2011-06-21 (×2): qty 1

## 2011-06-21 MED ORDER — ALBUTEROL SULFATE (5 MG/ML) 0.5% IN NEBU
2.5000 mg | INHALATION_SOLUTION | Freq: Three times a day (TID) | RESPIRATORY_TRACT | Status: DC
  Administered 2011-06-21 – 2011-06-26 (×16): 2.5 mg via RESPIRATORY_TRACT
  Filled 2011-06-21 (×15): qty 0.5

## 2011-06-21 NOTE — Progress Notes (Signed)
Subjective: Still having a cough No fever or chills   Objective: Vital signs in last 24 hours: Filed Vitals:   06/20/11 2128 06/21/11 0600 06/21/11 0755 06/21/11 1033  BP:  135/57  134/74  Pulse:  67    Temp:  98.2 F (36.8 C)    TempSrc:  Oral    Resp:  20    Height:      Weight:  95.4 kg (210 lb 5.1 oz)    SpO2: 97% 95% 93%    Weight change: 1.9 kg (4 lb 3 oz)  Intake/Output Summary (Last 24 hours) at 06/21/11 1154 Last data filed at 06/21/11 1100  Gross per 24 hour  Intake   1133 ml  Output   1925 ml  Net   -792 ml    Physical Exam: General: Awake, Oriented, No acute distress, productive cough HEENT: EOMI. Neck: Supple CV: S1 and S2, rrr Lungs: Coarse BS B/L, expiratory wheezing Abdomen: Soft, Nontender, Nondistended, +bowel sounds. Ext: Good pulses. Trace edema.   Lab Results:  [facility] 06/20/11 0515 06/19/11 0631  NA 139 142  K 4.6 3.6  CL 102 105  CO2 23 23  GLUCOSE 149* 109*  BUN 16 9  CREATININE 0.95 0.96  CALCIUM 9.6 9.6  MG 1.6 1.4*  PHOS -- --    Basename 06/19/11 0631  AST 29  ALT 22  ALKPHOS 48  BILITOT 0.4  PROT 6.3  ALBUMIN 3.4*   No results found for this basename: LIPASE:2,AMYLASE:2 in the last 72 hours  Basename 06/20/11 0515 06/19/11 0631 06/18/11 2135  WBC 5.4 6.4 --  NEUTROABS -- -- 5.0  HGB 12.5 11.9* --  HCT 39.2 37.0 --  MCV 93.8 93.2 --  PLT 269 242 --    Basename 06/20/11 0515 06/19/11 2107 06/19/11 1347  CKTOTAL 126 145 128  CKMB 3.7 3.2 3.0  CKMBINDEX -- -- --  TROPONINI <0.30 <0.30 <0.30   No components found with this basename: POCBNP:3 No results found for this basename: DDIMER:2 in the last 72 hours No results found for this basename: HGBA1C:2 in the last 72 hours No results found for this basename: CHOL:2,HDL:2,LDLCALC:2,TRIG:2,CHOLHDL:2,LDLDIRECT:2 in the last 72 hours  Basename 06/19/11 0631  TSH 1.526  T4TOTAL --  T3FREE --  THYROIDAB --   No results found for this basename:  VITAMINB12:2,FOLATE:2,FERRITIN:2,TIBC:2,IRON:2,RETICCTPCT:2 in the last 72 hours  Micro Results: No results found for this or any previous visit (from the past 240 hour(s)).  Studies/Results: No results found.  Medications: I have reviewed the patient's current medications. Scheduled Meds:    . albuterol  2.5 mg Nebulization TID  . amLODipine  5 mg Oral Daily  . antiseptic oral rinse  15 mL Mouth Rinse BID  . budesonide  0.5 mg Nebulization BID  . carvedilol  25 mg Oral BID WC  . dorzolamide  1 drop Both Eyes BID  . enoxaparin  40 mg Subcutaneous Q24H  . ferrous sulfate  325 mg Oral QODAY  . FLUoxetine  20 mg Oral Q1200  . furosemide  40 mg Intravenous Daily  . guaiFENesin  600 mg Oral BID  . levothyroxine  75 mcg Oral Q0600  . magnesium oxide  200 mg Oral BID  . moxifloxacin  400 mg Oral q1800  . OLANZapine  5 mg Oral Q1200  . pantoprazole  40 mg Oral Q1200  . predniSONE  40 mg Oral Q breakfast  . sodium chloride  3 mL Intravenous Q12H  . sodium chloride  3 mL  Intravenous Q12H  . DISCONTD: albuterol  2.5 mg Nebulization Q6H   Continuous Infusions:  PRN Meds:.acetaminophen, acetaminophen, albuterol, guaiFENesin-dextromethorphan, LORazepam, nitroGLYCERIN, ondansetron (ZOFRAN) IV, ondansetron  Assessment/Plan: Acute bronchitis (02/22/2011) Continue with nebulizer, avelox. Guaifenesin for cough/robitussin Steroids, Continue with Nebulizer treatments.   Acute on chronic diastolic heart failure (02/22/2011) Continue with lasix, monitor renal function.  Cycle cardiac enzymes.  Strict I and O.   PAF (paroxysmal atrial fibrillation) (06/19/2011) Continue with Coreg   Hypothyroidism (06/19/2011) Continue with Synthroid.   Hypomagnesemia: Replace with oral supplement repeat MG in am  PT eval  O2 sats at rest off oxygen- 88%    LOS: 3 days  Alezander Dimaano, DO 06/21/2011, 11:54 AM

## 2011-06-21 NOTE — Evaluation (Signed)
Physical Therapy Evaluation Patient Details Name: Debra Johnston MRN: [id number] DOB: [birth year] Today's Date: 06/21/2011 Time: 2841-3244 PT Time Calculation (min): 29 min  PT Assessment / Plan / Recommendation Clinical Impression  78 y.o. female admitted to [facility] for SOB and cough dx with bronchitis.  She presents today with generalized deconditioning and leg weakness.  She has decreased balance, mobility increased need for RW as assistive device of choice and decreased activitiy tolerance.  She would benefit from acute PT to maximize her independence, functional mobility and safety so that she may return home with her husband's 24 hour assist safely at discharge.      PT Assessment  Patient needs continued PT services    Follow Up Recommendations  Home health PT;Supervision/Assistance - 24 hour (patient is politely declining HH services despite husban's/P)    Equipment Recommendations  None recommended by PT    Frequency Min 3X/week    Precautions / Restrictions Precautions Precautions: Fall   Pertinent Vitals/Pain 7/10 chronic back pain, O2 sats on RA 91% during gait      Mobility  Transfers Transfers: Sit to Stand;Stand to Sit Sit to Stand: 4: Min guard;3: Mod assist;From elevated surface;From bed;From toilet;With upper extremity assist Stand to Sit: 4: Min guard;4: Min assist;To chair/3-in-1;To toilet;With armrests;With upper extremity assist Details for Transfer Assistance: min guard assist to stand from higher bed, mod assist to get up off of low commode even with grab bar.  Min assist to help control descent to sit on low commode even with grab bar.  From higher surfaces like the bed the patient is minguard assist for balance during the transition.    Ambulation/Gait Ambulation/Gait Assistance: 4: Min assist Ambulation Distance (Feet): 95 Feet Assistive device: Rolling walker Ambulation/Gait Assistance Details: min assist to steady patient for balance and help support  trunk over weak legs.  By the end of the walk the patient's legs showed visible signs of fatigue.  Verbal cues for upright posture and to stay closer to RW for stability Gait Pattern: Step-through pattern;Shuffle;Trunk flexed    Exercises General Exercises - Lower Extremity Long Arc Quad: AROM;Both;10 reps (5 second holds for 5 reps, no holds for 5 reps) Hip ABduction/ADduction: AROM;Both;10 reps (adduction only against a pillow for resistance) Hip Flexion/Marching: AROM;Both;10 reps;Seated Toe Raises: AROM;Both;20 reps;Seated Heel Raises: AROM;Both;15 reps;Seated   PT Goals Acute Rehab PT Goals PT Goal Formulation: With patient Time For Goal Achievement: 07/05/11 Potential to Achieve Goals: Good Pt will go Supine/Side to Sit: Independently;with HOB 0 degrees PT Goal: Supine/Side to Sit - Progress: Goal set today Pt will go Sit to Supine/Side: Independently;with HOB 0 degrees PT Goal: Sit to Supine/Side - Progress: Goal set today Pt will go Sit to Stand: with modified independence;with upper extremity assist PT Goal: Sit to Stand - Progress: Goal set today Pt will go Stand to Sit: with modified independence;with upper extremity assist PT Goal: Stand to Sit - Progress: Goal set today Pt will Transfer Bed to Chair/Chair to Bed: with modified independence PT Transfer Goal: Bed to Chair/Chair to Bed - Progress: Goal set today Pt will Ambulate: >150 feet;with supervision;with rolling walker PT Goal: Ambulate - Progress: Goal set today Pt will Go Up / Down Stairs: 3-5 stairs;with min assist;with rail(s) PT Goal: Up/Down Stairs - Progress: Goal set today Pt will Perform Home Exercise Program: with supervision, verbal cues required/provided PT Goal: Perform Home Exercise Program - Progress: Goal set today (HEP sheet given to patient today (06/21/11))  Visit Information  Last PT Received On: 06/21/11 Assistance Needed: +1    Subjective Data  Subjective: The patient reports that she feels  much weaker than her usual self, but doesn't want to persue HHPT despite PT's and husband's encouragement to do so.   Patient Stated Goal: to go home   Prior Functioning  Home Living Lives With: Spouse Available Help at Discharge: Family Type of Home: House Home Access: Stairs to enter [employer] of Steps: 3 Entrance Stairs-Rails: Right;Left;Can reach both Home Layout: One level Bathroom Shower/Tub: Tub/shower unit;Door [employer]: Standard (with a "high rise seat" on it) Home Adaptive Equipment: Walker - four wheeled;Straight cane;Raised toilet seat with rails Additional Comments: sponge bath Prior Function Level of Independence: Independent with assistive device(s);Needs assistance Needs Assistance: Bathing;Dressing;Meal Prep;Light Housekeeping Bath: Moderate Dressing: Moderate Meal Prep: Moderate Light Housekeeping: Total Driving: No Vocation: Retired Musician: No difficulties Dominant Hand: Right    Cognition  Overall Cognitive Status: Appears within functional limits for tasks assessed/performed Arousal/Alertness: Awake/alert Orientation Level: Appears intact for tasks assessed Behavior During Session: [facility] for tasks performed    Extremity/Trunk Assessment Right Lower Extremity Assessment RLE ROM/Strength/Tone: Deficits RLE ROM/Strength/Tone Deficits: grossly 3+/5 per functional assessmetn.   Left Lower Extremity Assessment LLE ROM/Strength/Tone: Deficits LLE ROM/Strength/Tone Deficits: grossly 3+/5 per functional assessment.     End of Session PT - End of Session Equipment Utilized During Treatment:  (RW) Activity Tolerance: Patient limited by fatigue Patient left: in chair;with call bell/phone within reach;with family/visitor present (husband in room)   Lurena Joiner B. Abrial Arrighi, PT, DPT [phone] 06/21/2011, 12:20 PM

## 2011-06-22 DIAGNOSIS — R05 Cough: Secondary | ICD-10-CM

## 2011-06-22 DIAGNOSIS — E039 Hypothyroidism, unspecified: Secondary | ICD-10-CM

## 2011-06-22 DIAGNOSIS — I509 Heart failure, unspecified: Secondary | ICD-10-CM

## 2011-06-22 DIAGNOSIS — J209 Acute bronchitis, unspecified: Secondary | ICD-10-CM

## 2011-06-22 MED ORDER — FUROSEMIDE 40 MG PO TABS
40.0000 mg | ORAL_TABLET | Freq: Every day | ORAL | Status: DC
  Administered 2011-06-22 – 2011-06-26 (×5): 40 mg via ORAL
  Filled 2011-06-22 (×5): qty 1

## 2011-06-22 NOTE — Care Management Note (Signed)
    Page 1 of 1   06/22/2011     12:23:09 PM   CARE MANAGEMENT NOTE 06/22/2011  Patient:  Debra Johnston, Debra Johnston   Account Number:  [id number]  Date Initiated:  06/19/2011  Documentation initiated by:  Alvira Philips Assessment:   78 yr-old female adm with dx of acute bronchitis; lives with spouse; has walker and elevated toilet seat     Action/Plan:   Anticipated DC Date:  06/24/2011   Anticipated DC Plan:  HOME/SELF CARE      DC Planning Services  CM consult      Choice offered to / List presented to:          [facility] arranged  HH - 11 Patient Refused      Status of service:   Medicare Important Message given?   (If response is "NO", the following Medicare IM given date fields will be blank) Date Medicare IM given:   Date Additional Medicare IM given:    Discharge Disposition:    Per UR Regulation:    If discussed at Long Length of Stay Meetings, dates discussed:    Comments:  PCP:  Dr. Miguel Aschoff  5/3 12:20p Debra Bertrum Helmstetter rn,bsn spoke w pt and left hhc agency list. pt does not want any hhc. pt states has rw and bsc at home. does not use home o2. has husband at home who is ill at present also.  5/2 11:14a Debra Eavan Gonterman rn,bsn [phone] await phy ther eval.  06/19/11 0950 Debra Mayo RN MSN CCM Discussed home health RN to assess and monitor VS, heart and lung sounds, O2 sats, pt declines, feels she does not need the service.  Await PT/OT evals.

## 2011-06-22 NOTE — Progress Notes (Signed)
Patient has received an overview of [facility] Care Management services at bedside.  Collateral material provided.  Services declined at this time.  Patient indicated that she would call us as needed. For any additional questions or new referrals please contact Anibal Henderson BSN RN [facility] Liaison at [phone].

## 2011-06-22 NOTE — Progress Notes (Signed)
Subjective: Still having a cough No fever or chills   Objective: Vital signs in last 24 hours: Filed Vitals:   06/21/11 2140 06/22/11 0601 06/22/11 0819 06/22/11 0932  BP: 126/74 147/75  129/67  Pulse: 79 69 91   Temp: 97.8 F (36.6 C) 97.9 F (36.6 C)    TempSrc: Oral Oral    Resp: 19 20 20    Height:      Weight:  90.8 kg (200 lb 2.8 oz)    SpO2: 97% 98% 92%    Weight change: -4.6 kg (-10 lb 2.3 oz)  Intake/Output Summary (Last 24 hours) at 06/22/11 1127 Last data filed at 06/22/11 1000  Gross per 24 hour  Intake   1253 ml  Output   1450 ml  Net   -197 ml    Physical Exam: General: Awake, Oriented, No acute distress, productive cough HEENT: EOMI. Neck: Supple CV: S1 and S2, rrr Lungs: Coarse BS B/L, expiratory wheezing Abdomen: Soft, Nontender, Nondistended, +bowel sounds. Ext: Good pulses. Trace edema.   Lab Results:  [facility] 06/20/11 0515  NA 139  K 4.6  CL 102  CO2 23  GLUCOSE 149*  BUN 16  CREATININE 0.95  CALCIUM 9.6  MG 1.6  PHOS --   No results found for this basename: AST:2,ALT:2,ALKPHOS:2,BILITOT:2,PROT:2,ALBUMIN:2 in the last 72 hours No results found for this basename: LIPASE:2,AMYLASE:2 in the last 72 hours  Basename 06/20/11 0515  WBC 5.4  NEUTROABS --  HGB 12.5  HCT 39.2  MCV 93.8  PLT 269    Basename 06/20/11 0515 06/19/11 2107 06/19/11 1347  CKTOTAL 126 145 128  CKMB 3.7 3.2 3.0  CKMBINDEX -- -- --  TROPONINI <0.30 <0.30 <0.30   No components found with this basename: POCBNP:3 No results found for this basename: DDIMER:2 in the last 72 hours No results found for this basename: HGBA1C:2 in the last 72 hours No results found for this basename: CHOL:2,HDL:2,LDLCALC:2,TRIG:2,CHOLHDL:2,LDLDIRECT:2 in the last 72 hours No results found for this basename: TSH,T4TOTAL,FREET3,T3FREE,THYROIDAB in the last 72 hours No results found for this basename: VITAMINB12:2,FOLATE:2,FERRITIN:2,TIBC:2,IRON:2,RETICCTPCT:2 in the last 72  hours  Micro Results: No results found for this or any previous visit (from the past 240 hour(s)).  Studies/Results: No results found.  Medications: I have reviewed the patient's current medications. Scheduled Meds:    . albuterol  2.5 mg Nebulization TID  . amLODipine  5 mg Oral Daily  . antiseptic oral rinse  15 mL Mouth Rinse BID  . budesonide  0.5 mg Nebulization BID  . carvedilol  25 mg Oral BID WC  . dorzolamide  1 drop Both Eyes BID  . enoxaparin  40 mg Subcutaneous Q24H  . ferrous sulfate  325 mg Oral QODAY  . FLUoxetine  20 mg Oral Q1200  . furosemide  40 mg Intravenous Daily  . guaiFENesin  600 mg Oral BID  . levothyroxine  75 mcg Oral Q0600  . magnesium oxide  200 mg Oral BID  . moxifloxacin  400 mg Oral q1800  . OLANZapine  5 mg Oral Q1200  . pantoprazole  40 mg Oral Q1200  . predniSONE  40 mg Oral Q breakfast  . sodium chloride  3 mL Intravenous Q12H  . sodium chloride  3 mL Intravenous Q12H   Continuous Infusions:  PRN Meds:.acetaminophen, acetaminophen, albuterol, bisacodyl, guaiFENesin-dextromethorphan, LORazepam, nitroGLYCERIN, ondansetron (ZOFRAN) IV, ondansetron  Assessment/Plan: Acute bronchitis (02/22/2011) Continue with nebulizer, avelox. Guaifenesin for cough/robitussin Steroids, Continue with Nebulizer treatments.   Acute on chronic diastolic heart  failure (02/22/2011) Continue with lasix- change to PO on 5/3, monitor renal function.  CE negative Strict I and O.   PAF (paroxysmal atrial fibrillation) (06/19/2011) Continue with Coreg   Hypothyroidism (06/19/2011) Continue with Synthroid.   Hypomagnesemia: Replace with oral supplement repeat MG in am  PT eval- patient declines PT, husband is caregiver  Will need to check O2 sats before D/C [facility] for D/C on Sunday     LOS: 4 days  Abbigale Mcelhaney, DO 06/22/2011, 11:27 AM

## 2011-06-23 DIAGNOSIS — R05 Cough: Secondary | ICD-10-CM

## 2011-06-23 DIAGNOSIS — E039 Hypothyroidism, unspecified: Secondary | ICD-10-CM

## 2011-06-23 DIAGNOSIS — J209 Acute bronchitis, unspecified: Secondary | ICD-10-CM

## 2011-06-23 DIAGNOSIS — I509 Heart failure, unspecified: Secondary | ICD-10-CM

## 2011-06-23 LAB — CBC
HCT: 37.1 % (ref 36.0–46.0)
Hemoglobin: 12.1 g/dL (ref 12.0–15.0)
MCV: 91.8 fL (ref 78.0–100.0)
RBC: 4.04 MIL/uL (ref 3.87–5.11)
RDW: 13.7 % (ref 11.5–15.5)
WBC: 9.9 10*3/uL (ref 4.0–10.5)

## 2011-06-23 LAB — BASIC METABOLIC PANEL
BUN: 20 mg/dL (ref 6–23)
CO2: 28 mEq/L (ref 19–32)
Chloride: 103 mEq/L (ref 96–112)
GFR calc Af Amer: 70 mL/min — ABNORMAL LOW (ref >90)
Potassium: 2.7 mEq/L — CL (ref 3.5–5.1)

## 2011-06-23 MED ORDER — POTASSIUM CHLORIDE CRYS ER 20 MEQ PO TBCR
40.0000 meq | EXTENDED_RELEASE_TABLET | Freq: Two times a day (BID) | ORAL | Status: AC
  Administered 2011-06-23 (×2): 40 meq via ORAL
  Filled 2011-06-23 (×2): qty 2

## 2011-06-23 MED ORDER — ALUM & MAG HYDROXIDE-SIMETH 200-200-20 MG/5ML PO SUSP
15.0000 mL | ORAL | Status: DC | PRN
  Administered 2011-06-23: 15 mL via ORAL

## 2011-06-23 NOTE — Progress Notes (Signed)
Physical Therapy Treatment Patient Details Name: Debra Johnston MRN: [id number] DOB: Jun 29, 1927 Today's Date: 06/23/2011 Time: 1191-4782 PT Time Calculation (min): 22 min  PT Assessment / Plan / Recommendation Comments on Treatment Session  Pt ambulated 150 feet w/RW on room air.  SpO2 >95 throughout.  c/o feeling SOB after session but sating at 96%.      Follow Up Recommendations  Home health PT;Supervision/Assistance - 24 hour    Equipment Recommendations  None recommended by PT    Frequency Min 3X/week   Plan Discharge plan remains appropriate    Precautions / Restrictions Precautions Precautions: Fall Restrictions Weight Bearing Restrictions: No   Pertinent Vitals/Pain Pt denies pain.  See ambulation section for SpO2 observations.     Mobility  Bed Mobility Bed Mobility: Supine to Sit Supine to Sit: 7: Independent Sit to Supine: 7: Independent;HOB flat Transfers Transfers: Sit to Stand;Stand to Sit Sit to Stand: 5: Supervision;From bed;From chair/3-in-1;With upper extremity assist Stand to Sit: 5: Supervision;To chair/3-in-1;To bed Details for Transfer Assistance: Supervision for safety as pt reports feeling unsteady in standing without assistive device.  Ambulation/Gait Ambulation/Gait Assistance: 5: Supervision Ambulation Distance (Feet): 150 Feet Assistive device: Rolling walker Ambulation/Gait Assistance Details: Pt had difficutly steering the walker. Verbal cues to decrease distance from walker.  Gait Pattern: Step-through pattern;Shuffle;Trunk flexed Stairs: No Wheelchair Mobility Wheelchair Mobility: No    Exercises     PT Goals Acute Rehab PT Goals PT Goal Formulation: With patient Time For Goal Achievement: 07/05/11 Potential to Achieve Goals: Good Pt will go Supine/Side to Sit: Independently;with HOB 0 degrees PT Goal: Supine/Side to Sit - Progress: Met Pt will go Sit to Supine/Side: Independently;with HOB 0 degrees PT Goal: Sit to Supine/Side -  Progress: Met Pt will go Sit to Stand: with modified independence;with upper extremity assist PT Goal: Sit to Stand - Progress: Met Pt will go Stand to Sit: with modified independence;with upper extremity assist PT Goal: Stand to Sit - Progress: Met Pt will Transfer Bed to Chair/Chair to Bed: with modified independence PT Transfer Goal: Bed to Chair/Chair to Bed - Progress: Progressing toward goal Pt will Ambulate: >150 feet;with supervision;with rolling walker PT Goal: Ambulate - Progress: Met  Visit Information  Last PT Received On: 06/23/11 Assistance Needed: +1    Subjective Data  Subjective: I didn't know I was so weak.  Patient Stated Goal: to go home   Cognition  Overall Cognitive Status: Appears within functional limits for tasks assessed/performed Arousal/Alertness: Awake/alert Orientation Level: Appears intact for tasks assessed Behavior During Session: [facility] for tasks performed    Balance  Balance Balance Assessed: No  End of Session PT - End of Session Equipment Utilized During Treatment: Gait belt Activity Tolerance: Patient tolerated treatment well Patient left: in bed;with call bell/phone within reach Nurse Communication: Mobility status;Other (comment) (activity tolerance)    Angellee Cohill 06/23/2011, 3:52 PM Wanette Robison L. Hezekiah Veltre DPT [phone]

## 2011-06-23 NOTE — Progress Notes (Signed)
CRITICAL VALUE ALERT  Critical value received:  K 2.7  Date of notification:  06/23/11  Time of notification:  0805  Critical value read back: yes  Nurse who received alert: Osvaldo Human   MD notified (1st page):  Blake Divine   Time of first page:  0805  MD notified (2nd page):  Time of second page:  Responding MD:  Blake Divine  Time MD responded:  [phone]

## 2011-06-23 NOTE — Progress Notes (Signed)
Subjective: Had coughing fit this AM and made her short of breath Husband also sick   Objective: Vital signs in last 24 hours: Filed Vitals:   06/22/11 2126 06/22/11 2145 06/23/11 0451 06/23/11 0745  BP:  149/51 170/87   Pulse:  71 72   Temp:  98 F (36.7 C) 98.1 F (36.7 C)   TempSrc:  Oral Oral   Resp:  18 18   Height:      Weight:   93.9 kg (207 lb 0.2 oz)   SpO2: 94% 96% 96% 94%   Weight change: 3.1 kg (6 lb 13.4 oz)  Intake/Output Summary (Last 24 hours) at 06/23/11 1004 Last data filed at 06/23/11 0856  Gross per 24 hour  Intake    980 ml  Output   1100 ml  Net   -120 ml    Physical Exam: General: Awake, Oriented, No acute distress, productive cough HEENT: EOMI. Neck: Supple CV: S1 and S2, rrr Lungs: Coarse BS B/L, expiratory wheezing Abdomen: Soft, Nontender, Nondistended, +bowel sounds. Ext: Good pulses. Trace edema.   Lab Results:  Basename 06/23/11 0630  NA 143  K 2.7*  CL 103  CO2 28  GLUCOSE 95  BUN 20  CREATININE 0.88  CALCIUM 9.4  MG --  PHOS --   No results found for this basename: AST:2,ALT:2,ALKPHOS:2,BILITOT:2,PROT:2,ALBUMIN:2 in the last 72 hours No results found for this basename: LIPASE:2,AMYLASE:2 in the last 72 hours  Basename 06/23/11 0630  WBC 9.9  NEUTROABS --  HGB 12.1  HCT 37.1  MCV 91.8  PLT 342   No results found for this basename: CKTOTAL:3,CKMB:3,CKMBINDEX:3,TROPONINI:3 in the last 72 hours No components found with this basename: POCBNP:3 No results found for this basename: DDIMER:2 in the last 72 hours No results found for this basename: HGBA1C:2 in the last 72 hours No results found for this basename: CHOL:2,HDL:2,LDLCALC:2,TRIG:2,CHOLHDL:2,LDLDIRECT:2 in the last 72 hours No results found for this basename: TSH,T4TOTAL,FREET3,T3FREE,THYROIDAB in the last 72 hours No results found for this basename: VITAMINB12:2,FOLATE:2,FERRITIN:2,TIBC:2,IRON:2,RETICCTPCT:2 in the last 72 hours  Micro Results: No results  found for this or any previous visit (from the past 240 hour(s)).  Studies/Results: No results found.  Medications: I have reviewed the patient's current medications. Scheduled Meds:    . albuterol  2.5 mg Nebulization TID  . amLODipine  5 mg Oral Daily  . antiseptic oral rinse  15 mL Mouth Rinse BID  . budesonide  0.5 mg Nebulization BID  . carvedilol  25 mg Oral BID WC  . dorzolamide  1 drop Both Eyes BID  . enoxaparin  40 mg Subcutaneous Q24H  . ferrous sulfate  325 mg Oral QODAY  . FLUoxetine  20 mg Oral Q1200  . furosemide  40 mg Oral Daily  . guaiFENesin  600 mg Oral BID  . levothyroxine  75 mcg Oral Q0600  . magnesium oxide  200 mg Oral BID  . moxifloxacin  400 mg Oral q1800  . OLANZapine  5 mg Oral Q1200  . pantoprazole  40 mg Oral Q1200  . potassium chloride  40 mEq Oral BID  . predniSONE  40 mg Oral Q breakfast  . sodium chloride  3 mL Intravenous Q12H  . sodium chloride  3 mL Intravenous Q12H  . DISCONTD: furosemide  40 mg Intravenous Daily   Continuous Infusions:  PRN Meds:.acetaminophen, acetaminophen, albuterol, bisacodyl, guaiFENesin-dextromethorphan, LORazepam, nitroGLYCERIN, ondansetron (ZOFRAN) IV, ondansetron  Assessment/Plan: Acute bronchitis (02/22/2011) Continue with nebulizer, avelox. Guaifenesin for cough/robitussin Steroids, Continue with Nebulizer treatments.  Acute on chronic diastolic heart failure (02/22/2011) Continue with lasix- change to PO on 5/3, monitor renal function.  CE negative Strict I and O.   PAF (paroxysmal atrial fibrillation) (06/19/2011) Continue with Coreg   Hypothyroidism (06/19/2011) Continue with Synthroid.   Hypomagnesemia: Replace with oral supplement repeat MG in am  PT eval- patient declines PT, husband is caregiver  Will need to check O2 sats before D/C [facility] for D/C tomm     LOS: 5 days  Reynol Arnone, DO 06/23/2011, 10:04 AM

## 2011-06-23 NOTE — Progress Notes (Signed)
Pt had  An episode of non sustained svt then HR went back to 68. Pt was awake during the episode asymptomatic no complaints of chest pains nor palpitations. Observed pt closely.

## 2011-06-24 DIAGNOSIS — R05 Cough: Secondary | ICD-10-CM

## 2011-06-24 DIAGNOSIS — J209 Acute bronchitis, unspecified: Secondary | ICD-10-CM

## 2011-06-24 DIAGNOSIS — E039 Hypothyroidism, unspecified: Secondary | ICD-10-CM

## 2011-06-24 DIAGNOSIS — I509 Heart failure, unspecified: Secondary | ICD-10-CM

## 2011-06-24 LAB — PRO B NATRIURETIC PEPTIDE: Pro B Natriuretic peptide (BNP): 594.4 pg/mL — ABNORMAL HIGH (ref 0–450)

## 2011-06-24 LAB — MAGNESIUM: Magnesium: 1.9 mg/dL (ref 1.5–2.5)

## 2011-06-24 LAB — BASIC METABOLIC PANEL
CO2: 29 mEq/L (ref 19–32)
Chloride: 103 mEq/L (ref 96–112)
Creatinine, Ser: 0.89 mg/dL (ref 0.50–1.10)
Glucose, Bld: 107 mg/dL — ABNORMAL HIGH (ref 70–99)

## 2011-06-24 NOTE — Progress Notes (Signed)
Subjective: Still with some SOB and cough but overall better   Objective: Vital signs in last 24 hours: Filed Vitals:   06/23/11 2301 06/24/11 0629 06/24/11 0639 06/24/11 0748  BP: 135/70  154/85   Pulse: 61  63   Temp: 97.4 F (36.3 C)  97.6 F (36.4 C)   TempSrc: Oral  Oral   Resp: 18  18   Height:      Weight:  94.938 kg (209 lb 4.8 oz)    SpO2: 93%  96% 98%   Weight change: 1.038 kg (2 lb 4.6 oz)  Intake/Output Summary (Last 24 hours) at 06/24/11 1059 Last data filed at 06/24/11 0629  Gross per 24 hour  Intake    703 ml  Output   1050 ml  Net   -347 ml    Physical Exam: General: Awake, Oriented, No acute distress, productive cough HEENT: EOMI. Neck: Supple CV: S1 and S2, rrr Lungs: Coarse BS B/L, no wheezing Abdomen: Soft, Nontender, Nondistended, +bowel sounds. Ext: Good pulses. Trace edema.   Lab Results:  Basename 06/24/11 0447 06/23/11 0630  NA 143 143  K 3.5 2.7*  CL 103 103  CO2 29 28  GLUCOSE 107* 95  BUN 21 20  CREATININE 0.89 0.88  CALCIUM 9.6 9.4  MG 1.9 --  PHOS -- --   No results found for this basename: AST:2,ALT:2,ALKPHOS:2,BILITOT:2,PROT:2,ALBUMIN:2 in the last 72 hours No results found for this basename: LIPASE:2,AMYLASE:2 in the last 72 hours  Basename 06/23/11 0630  WBC 9.9  NEUTROABS --  HGB 12.1  HCT 37.1  MCV 91.8  PLT 342   No results found for this basename: CKTOTAL:3,CKMB:3,CKMBINDEX:3,TROPONINI:3 in the last 72 hours No components found with this basename: POCBNP:3 No results found for this basename: DDIMER:2 in the last 72 hours No results found for this basename: HGBA1C:2 in the last 72 hours No results found for this basename: CHOL:2,HDL:2,LDLCALC:2,TRIG:2,CHOLHDL:2,LDLDIRECT:2 in the last 72 hours No results found for this basename: TSH,T4TOTAL,FREET3,T3FREE,THYROIDAB in the last 72 hours No results found for this basename: VITAMINB12:2,FOLATE:2,FERRITIN:2,TIBC:2,IRON:2,RETICCTPCT:2 in the last 72 hours  Micro  Results: No results found for this or any previous visit (from the past 240 hour(s)).  Studies/Results: No results found.  Medications: I have reviewed the patient's current medications. Scheduled Meds:    . albuterol  2.5 mg Nebulization TID  . amLODipine  5 mg Oral Daily  . antiseptic oral rinse  15 mL Mouth Rinse BID  . budesonide  0.5 mg Nebulization BID  . carvedilol  25 mg Oral BID WC  . dorzolamide  1 drop Both Eyes BID  . enoxaparin  40 mg Subcutaneous Q24H  . ferrous sulfate  325 mg Oral QODAY  . FLUoxetine  20 mg Oral Q1200  . furosemide  40 mg Oral Daily  . guaiFENesin  600 mg Oral BID  . levothyroxine  75 mcg Oral Q0600  . magnesium oxide  200 mg Oral BID  . moxifloxacin  400 mg Oral q1800  . OLANZapine  5 mg Oral Q1200  . pantoprazole  40 mg Oral Q1200  . potassium chloride  40 mEq Oral BID  . predniSONE  40 mg Oral Q breakfast  . sodium chloride  3 mL Intravenous Q12H  . sodium chloride  3 mL Intravenous Q12H   Continuous Infusions:  PRN Meds:.acetaminophen, acetaminophen, albuterol, alum & mag hydroxide-simeth, bisacodyl, guaiFENesin-dextromethorphan, LORazepam, nitroGLYCERIN, ondansetron (ZOFRAN) IV, ondansetron  Assessment/Plan: Acute bronchitis (02/22/2011) Continue with nebulizer, avelox. Guaifenesin for cough/robitussin Steroids, Continue with  Nebulizer treatments.   Acute on chronic diastolic heart failure (02/22/2011) Continue with lasix- change to PO on 5/3, monitor renal function.  CE negative Strict I and O.   PAF (paroxysmal atrial fibrillation) (06/19/2011) Continue with Coreg   Hypothyroidism (06/19/2011) Continue with Synthroid.   Hypomagnesemia: Replace with oral supplement repeat MG in am  PT eval- patient declines PT, husband is caregiver  Will need to check O2 sats before D/C [facility] for D/C tomm     LOS: 6 days  Halley Kincer, DO 06/24/2011, 10:59 AM

## 2011-06-25 DIAGNOSIS — J209 Acute bronchitis, unspecified: Secondary | ICD-10-CM

## 2011-06-25 DIAGNOSIS — E039 Hypothyroidism, unspecified: Secondary | ICD-10-CM

## 2011-06-25 DIAGNOSIS — I509 Heart failure, unspecified: Secondary | ICD-10-CM

## 2011-06-25 DIAGNOSIS — R05 Cough: Secondary | ICD-10-CM

## 2011-06-25 MED ORDER — PREDNISONE 20 MG PO TABS
30.0000 mg | ORAL_TABLET | Freq: Every day | ORAL | Status: DC
  Administered 2011-06-26: 30 mg via ORAL
  Filled 2011-06-25 (×2): qty 1

## 2011-06-25 MED ORDER — FUROSEMIDE 10 MG/ML IJ SOLN
40.0000 mg | Freq: Once | INTRAMUSCULAR | Status: AC
  Administered 2011-06-25: 40 mg via INTRAVENOUS
  Filled 2011-06-25 (×2): qty 4

## 2011-06-25 MED ORDER — BISACODYL 5 MG PO TBEC
5.0000 mg | DELAYED_RELEASE_TABLET | Freq: Once | ORAL | Status: AC
  Administered 2011-06-25: 5 mg via ORAL

## 2011-06-25 MED ORDER — GUAIFENESIN-CODEINE 100-10 MG/5ML PO SOLN: 5.0000 mL | Freq: Four times a day (QID) | ORAL | Status: DC | PRN

## 2011-06-25 MED ORDER — POTASSIUM CHLORIDE CRYS ER 20 MEQ PO TBCR
40.0000 meq | EXTENDED_RELEASE_TABLET | Freq: Once | ORAL | Status: AC
  Administered 2011-06-25: 40 meq via ORAL
  Filled 2011-06-25: qty 2

## 2011-06-25 NOTE — Progress Notes (Signed)
Subjective: C/o constipation and SOB Needs to ambulate in halls TID   Objective: Vital signs in last 24 hours: Filed Vitals:   06/24/11 2010 06/24/11 2154 06/25/11 0527 06/25/11 0829  BP:  144/77 156/84   Pulse:  66 60   Temp:  97.5 F (36.4 C) 97.4 F (36.3 C)   TempSrc:  Oral Oral   Resp:  18 18   Height:      Weight:   95.5 kg (210 lb 8.6 oz)   SpO2: 97% 96% 95% 98%   Weight change: 0.562 kg (1 lb 3.8 oz)  Intake/Output Summary (Last 24 hours) at 06/25/11 1052 Last data filed at 06/25/11 0953  Gross per 24 hour  Intake    720 ml  Output   1125 ml  Net   -405 ml    Physical Exam: General: Awake, Oriented, No acute distress, productive cough HEENT: EOMI. Neck: Supple CV: S1 and S2, rrr Lungs: Coarse BS B/L, no wheezing Abdomen: Soft, Nontender, Nondistended, +bowel sounds. Ext: Good pulses. Trace edema.   Lab Results:  Basename 06/24/11 0447 06/23/11 0630  NA 143 143  K 3.5 2.7*  CL 103 103  CO2 29 28  GLUCOSE 107* 95  BUN 21 20  CREATININE 0.89 0.88  CALCIUM 9.6 9.4  MG 1.9 --  PHOS -- --   No results found for this basename: AST:2,ALT:2,ALKPHOS:2,BILITOT:2,PROT:2,ALBUMIN:2 in the last 72 hours No results found for this basename: LIPASE:2,AMYLASE:2 in the last 72 hours  Basename 06/23/11 0630  WBC 9.9  NEUTROABS --  HGB 12.1  HCT 37.1  MCV 91.8  PLT 342   No results found for this basename: CKTOTAL:3,CKMB:3,CKMBINDEX:3,TROPONINI:3 in the last 72 hours No components found with this basename: POCBNP:3 No results found for this basename: DDIMER:2 in the last 72 hours No results found for this basename: HGBA1C:2 in the last 72 hours No results found for this basename: CHOL:2,HDL:2,LDLCALC:2,TRIG:2,CHOLHDL:2,LDLDIRECT:2 in the last 72 hours No results found for this basename: TSH,T4TOTAL,FREET3,T3FREE,THYROIDAB in the last 72 hours No results found for this basename: VITAMINB12:2,FOLATE:2,FERRITIN:2,TIBC:2,IRON:2,RETICCTPCT:2 in the last 72  hours  Micro Results: No results found for this or any previous visit (from the past 240 hour(s)).  Studies/Results: No results found.  Medications: I have reviewed the patient's current medications. Scheduled Meds:    . albuterol  2.5 mg Nebulization TID  . amLODipine  5 mg Oral Daily  . antiseptic oral rinse  15 mL Mouth Rinse BID  . bisacodyl  5 mg Oral Once  . budesonide  0.5 mg Nebulization BID  . carvedilol  25 mg Oral BID WC  . dorzolamide  1 drop Both Eyes BID  . enoxaparin  40 mg Subcutaneous Q24H  . ferrous sulfate  325 mg Oral QODAY  . FLUoxetine  20 mg Oral Q1200  . furosemide  40 mg Intravenous Once  . furosemide  40 mg Oral Daily  . guaiFENesin  600 mg Oral BID  . levothyroxine  75 mcg Oral Q0600  . magnesium oxide  200 mg Oral BID  . OLANZapine  5 mg Oral Q1200  . pantoprazole  40 mg Oral Q1200  . potassium chloride  40 mEq Oral Once  . predniSONE  30 mg Oral Q breakfast  . sodium chloride  3 mL Intravenous Q12H  . sodium chloride  3 mL Intravenous Q12H  . DISCONTD: moxifloxacin  400 mg Oral q1800  . DISCONTD: predniSONE  40 mg Oral Q breakfast   Continuous Infusions:  PRN Meds:.acetaminophen, acetaminophen,  albuterol, alum & mag hydroxide-simeth, guaiFENesin-codeine, LORazepam, nitroGLYCERIN, ondansetron (ZOFRAN) IV, ondansetron, DISCONTD: bisacodyl, DISCONTD: guaiFENesin-dextromethorphan  Assessment/Plan: Acute bronchitis (02/22/2011) Continue with nebulizer, avelox. Guaifenesin for cough/robitussin wit codeine Steroids, Continue with Nebulizer treatments.   Acute on chronic diastolic heart failure (02/22/2011) Continue with lasix- change to PO on 5/3, monitor renal function.  CE negative Strict I and O.   PAF (paroxysmal atrial fibrillation) (06/19/2011) Continue with Coreg   Hypothyroidism (06/19/2011) Continue with Synthroid.   Hypomagnesemia: Replace with oral supplement repeat MG in am  PT eval- patient declines PT, husband is caregiver  Will  need to check O2 sats and ambulate patient [facility] for D/C tomm     LOS: 7 days  Biff Rutigliano, DO 06/25/2011, 10:52 AM

## 2011-06-25 NOTE — Progress Notes (Addendum)
Physical Therapy Treatment Patient Details Name: Debra Johnston MRN: [id number] DOB: [birth year] Today's Date: 06/25/2011 Time: 7829-5621 PT Time Calculation (min): 25 min  PT Assessment / Plan / Recommendation Comments on Treatment Session  Pt is appropriate for discharge home when cleared by MD.     Follow Up Recommendations  No PT follow up;Supervision - Intermittent    Equipment Recommendations  None recommended by PT    Frequency Min 3X/week   Plan Discharge plan needs to be updated;Frequency remains appropriate    Precautions / Restrictions Precautions Precautions: Fall Restrictions Weight Bearing Restrictions: No   Pertinent Vitals/Pain Pt denies pain. See ambulation section for SpO2     Mobility  Bed Mobility Bed Mobility: Not assessed Transfers Transfers: Sit to Stand;Stand to Sit;Stand Pivot Transfers Sit to Stand: 7: Independent;From bed;From chair/3-in-1;With upper extremity assist Stand to Sit: 5: Supervision;7: Independent;To bed;To chair/3-in-1;With upper extremity assist Stand Pivot Transfers: 6: Modified independent (Device/Increase time) Details for Transfer Assistance: Verbal and visual cues for safe technique when sitting after prolonged ambulation.  Pt attempted to sit with Left side perpendicular to chair. Instucted pt in proper technique and educated pt and spouse on increased falls risk with improper technique.  Ambulation/Gait Ambulation/Gait Assistance: 6: Modified independent (Device/Increase time) Ambulation Distance (Feet): 260 Feet Assistive device: Rolling walker Ambulation/Gait Assistance Details: Pt on Room air with SpO2 >94 and no SOB.  Gait Pattern: Within Functional Limits Stairs: No Wheelchair Mobility Wheelchair Mobility: No    Exercises     PT Goals Acute Rehab PT Goals PT Goal Formulation: With patient Time For Goal Achievement: 07/05/11 Potential to Achieve Goals: Good Pt will go Supine/Side to Sit: Independently;with HOB 0  degrees PT Goal: Supine/Side to Sit - Progress: Met Pt will go Sit to Supine/Side: Independently;with HOB 0 degrees PT Goal: Sit to Supine/Side - Progress: Met Pt will go Sit to Stand: with modified independence;with upper extremity assist PT Goal: Sit to Stand - Progress: Met Pt will go Stand to Sit: with modified independence;with upper extremity assist PT Goal: Stand to Sit - Progress: Met Pt will Transfer Bed to Chair/Chair to Bed: with modified independence PT Transfer Goal: Bed to Chair/Chair to Bed - Progress: Met Pt will Ambulate: >150 feet;with supervision;with rolling walker PT Goal: Ambulate - Progress: Met Pt will Go Up / Down Stairs: 3-5 stairs;with min assist;with rail(s) PT Goal: Up/Down Stairs - Progress: Not met PT Goal: Perform Home Exercise Program - Progress: Discontinued (comment)  Visit Information  Last PT Received On: 06/25/11    Subjective Data  Subjective: I am feeling better.  Patient Stated Goal: Go home tomorrow;.     Cognition  Overall Cognitive Status: Appears within functional limits for tasks assessed/performed Arousal/Alertness: Awake/alert Orientation Level: Appears intact for tasks assessed Behavior During Session: [facility] for tasks performed    Balance  Balance Balance Assessed: No  End of Session PT - End of Session Equipment Utilized During Treatment: Gait belt Activity Tolerance: Patient tolerated treatment well Patient left: in chair;with call bell/phone within reach;with family/visitor present Nurse Communication: Mobility status;Other (comment) (pt activty tolerance)    Berlin Mokry 06/25/2011, 11:40 AM Trenton Passow L. Joeanthony Seeling DPT [phone]

## 2011-06-26 DIAGNOSIS — E039 Hypothyroidism, unspecified: Secondary | ICD-10-CM

## 2011-06-26 DIAGNOSIS — I509 Heart failure, unspecified: Secondary | ICD-10-CM

## 2011-06-26 DIAGNOSIS — J209 Acute bronchitis, unspecified: Secondary | ICD-10-CM

## 2011-06-26 DIAGNOSIS — R05 Cough: Secondary | ICD-10-CM

## 2011-06-26 LAB — CREATININE, SERUM
GFR calc Af Amer: 70 mL/min — ABNORMAL LOW (ref >90)
GFR calc non Af Amer: 61 mL/min — ABNORMAL LOW (ref >90)

## 2011-06-26 MED ORDER — GUAIFENESIN-CODEINE 100-10 MG/5ML PO SOLN: 5.0000 mL | Freq: Four times a day (QID) | ORAL | Status: AC | PRN

## 2011-06-26 MED ORDER — PREDNISONE 10 MG PO TABS: ORAL_TABLET | ORAL | Status: DC

## 2011-06-26 NOTE — Discharge Summary (Signed)
Discharge Summary  Debra Johnston MR#: [id number]  DOB:12-11-1927  Date of Admission: 06/18/2011 Date of Discharge: 06/26/2011  Patient's PCP: Miguel Aschoff, MD, MD  Attending Physician:Oluwadamilare Tobler    Discharge Diagnoses: Active Problems:  Acute bronchitis  Acute on chronic diastolic heart failure  PAF (paroxysmal atrial fibrillation)  Hypothyroidism   Brief Admitting History and Physical 78 year-old female with known history of diastolic CHF last EF measured in January 2013 was 60% with RV dilatation, presented to the ER because of ongoing shortness of breath with cough. Patient's symptoms started 3 days ago and had gone to urgent care 2 days ago and was prescribed antibiotics names of which she is not able to recall. After having antibiotic he threw up twice. And patient showed up in the ER. Patient's shortness of breath worsens on exertion and has been having nonproductive cough. Patient has had subjective feeling of fever chills and in the ER patient was found to have a temperature of 100F with chest x-ray showing venous congestion. Patient's BNP is found to be elevated. Patient will be admitted for further management. At this time patient has received 40 of Lasix thru IV and Avelox by mouth. Patient at this time is able to take by mouth and states her nausea and vomiting has largely subsided. Denies any abdominal pain or diarrhea.   Discharge Medications Medication List  As of 06/26/2011  8:52 AM   TAKE these medications         albuterol-ipratropium 18-103 MCG/ACT inhaler   Commonly known as: COMBIVENT   Inhale 2 puffs into the lungs every 6 (six) hours as needed for wheezing.      amLODipine 5 MG tablet   Commonly known as: NORVASC   Take 1 tablet (5 mg total) by mouth daily.      carvedilol 25 MG tablet   Commonly known as: COREG   Take 25 mg by mouth 2 (two) times daily with a meal.      dorzolamide 2 % ophthalmic solution   Commonly known as: TRUSOPT   Place 1 drop  into both eyes 2 (two) times daily.      ferrous sulfate 325 (65 FE) MG tablet   Take 325 mg by mouth every other day.      FLUoxetine 20 MG capsule   Commonly known as: PROZAC   Take 20 mg by mouth daily at 12 noon.      furosemide 40 MG tablet   Commonly known as: LASIX   Take 1 tablet (40 mg total) by mouth daily.      guaiFENesin-codeine 100-10 MG/5ML syrup   Take 5 mLs by mouth every 6 (six) hours as needed for cough.      ICAPS PO   Take 1 tablet by mouth daily.      levothyroxine 75 MCG tablet   Commonly known as: SYNTHROID, LEVOTHROID   Take 75 mcg by mouth daily.      LORazepam 2 MG tablet   Commonly known as: ATIVAN   Take 2 mg by mouth every 8 (eight) hours as needed. anxiety      nitroGLYCERIN 0.4 MG SL tablet   Commonly known as: NITROSTAT   Place 1 tablet (0.4 mg total) under the tongue every 5 (five) minutes as needed for chest pain.      OLANZapine 5 MG tablet   Commonly known as: ZYPREXA   Take 5 mg by mouth daily at 12 noon.      pantoprazole 40 MG  tablet   Commonly known as: PROTONIX   Take 40 mg by mouth daily. 1 hour before breakfast      predniSONE 10 MG tablet   Commonly known as: DELTASONE   20 mg daily x 3, 10 mg daily x 3 days then D/C            Hospital Course: Acute bronchitis (02/22/2011) Abx, steroid taper, patient's breathing improved and was removed from O2  Acute on chronic diastolic heart failure (02/22/2011) Continue with lasix- change to PO on 5/3, monitor renal function.  CE negative    PAF (paroxysmal atrial fibrillation) (06/19/2011) Continue with Coreg   Hypothyroidism (06/19/2011) Continue with Synthroid.  Hypomagnesemia: Replace with oral supplement   PT eval- patient declines home health PT      Day of Discharge BP 146/75  Pulse 64  Temp(Src) 97.8 F (36.6 C) (Oral)  Resp 18  Ht 5\' 10"  (1.778 m)  Wt 94.711 kg (208 lb 12.8 oz)  BMI 29.96 kg/m2  SpO2 92% A+O x3 NAD -c/c/e +BS, soft,  NT/ND -c/c/e  Results for orders placed during the hospital encounter of 06/18/11 (from the past 48 hour(s))  CREATININE, SERUM     Status: Abnormal   Collection Time   06/26/11  6:20 AM      Component Value Range Comment   Creatinine, Ser 0.87  0.50 - 1.10 (mg/dL)    GFR calc non Af Amer 61 (*) >90 (mL/min)    GFR calc Af Amer 70 (*) >90 (mL/min)     Dg Chest 2 View  06/18/2011  *RADIOLOGY REPORT*  Clinical Data: Cough, shortness of breath  CHEST - 2 VIEW  Comparison: 03/01/2011; 02/22/2011; 07/30/2008  Findings: Grossly unchanged cardiac silhouette and mediastinal contours.  There is persistent mild elevation of the right hemidiaphragm. Mild pulmonary venous congestion.  No new focal airspace opacities.  No pleural effusion or pneumothorax.  Grossly unchanged symmetric biapical pleural parenchymal thickening. Unchanged bones.  IMPRESSION: Mild pulmonary venous congestion without frank evidence of pulmonary edema.  Original Report Authenticated By: Waynard Reeds, M.D.     Disposition: home with 24 hour supervision by husband  Diet: cardiac  Activity: as tolerated   Follow-up Appts: Discharge Orders    Future Appointments: Provider: Department: Dept Phone: Center:   07/20/2011 2:15 PM Peter M [place], MD [facility] [phone] None   08/07/2011 1:30 PM Waymon Budge, MD [facility] [phone] None     Future Orders Please Complete By Expires   Diet - low sodium heart healthy      Increase activity slowly      Discharge instructions      Comments:   Use walker   Heart Failure patients record your daily weight using the same scale at the same time of day      Beta Blocker already ordered      Contraindication to ACEI at discharge          Time spent on discharge, talking to the patient, and coordinating care: 45 mins.   SignedMarlin Canary, DO 06/26/2011, 8:52 AM

## 2011-07-20 ENCOUNTER — Ambulatory Visit: Payer: Self-pay | Admitting: Cardiology

## 2011-08-07 ENCOUNTER — Ambulatory Visit: Payer: Self-pay | Admitting: Internal Medicine

## 2011-08-07 ENCOUNTER — Ambulatory Visit: Payer: Self-pay | Admitting: Cardiology

## 2011-08-15 ENCOUNTER — Ambulatory Visit: Payer: Self-pay | Admitting: Cardiology

## 2011-09-17 ENCOUNTER — Ambulatory Visit: Payer: Medicare Other | Admitting: Internal Medicine

## 2011-09-18 ENCOUNTER — Ambulatory Visit: Payer: Medicare Other | Admitting: Internal Medicine

## 2011-10-09 ENCOUNTER — Encounter: Payer: Self-pay | Admitting: Cardiology

## 2011-10-09 ENCOUNTER — Ambulatory Visit (INDEPENDENT_AMBULATORY_CARE_PROVIDER_SITE_OTHER): Payer: Medicare Other | Admitting: Cardiology

## 2011-10-09 VITALS — HR 64 | Ht 70.0 in | Wt 201.8 lb

## 2011-10-09 DIAGNOSIS — I5032 Chronic diastolic (congestive) heart failure: Secondary | ICD-10-CM

## 2011-10-09 DIAGNOSIS — I4891 Unspecified atrial fibrillation: Secondary | ICD-10-CM

## 2011-10-09 DIAGNOSIS — N189 Chronic kidney disease, unspecified: Secondary | ICD-10-CM

## 2011-10-09 DIAGNOSIS — I509 Heart failure, unspecified: Secondary | ICD-10-CM

## 2011-10-09 NOTE — Patient Instructions (Signed)
Continue your current medication and sodium restriction  I will see you again in 6 months.

## 2011-10-09 NOTE — Progress Notes (Signed)
Debra Johnston Date of Birth: 1927-12-13 Medical Record #[id number]  History of Present Illness: Debra Johnston is seen back today to establish cardiac care. She is a former patient of Dr. Deborah Johnston. She has a history of diastolic congestive heart failure. She also has a history of paroxysmal atrial fibrillation. She is not felt to be a candidate for Coumadin because of the history of recurrent falls. When seen last February her renal function had deteriorated. With discontinuation of her micardis her creatinine normalized. She was hospitalized in May with an acute bronchitis and some volume overload. She responded well to diuresis. Since February her weight is down 24 pounds. She reports her breathing is doing very well. Her swelling has resolved. She has no chest pain. She noted one episode where her heart seemed to be a little bit fast for a few minutes and then resolved.  Current Outpatient Prescriptions on File Prior to Visit  Medication Sig Dispense Refill  . albuterol-ipratropium (COMBIVENT) 18-103 MCG/ACT inhaler Inhale 2 puffs into the lungs every 6 (six) hours as needed for wheezing.  1 Inhaler  6  . amLODipine (NORVASC) 5 MG tablet Take 1 tablet (5 mg total) by mouth daily.  30 tablet  6  . carvedilol (COREG) 25 MG tablet Take 25 mg by mouth 2 (two) times daily with a meal.        . dorzolamide (TRUSOPT) 2 % ophthalmic solution Place 1 drop into both eyes 2 (two) times daily.  10 mL  6  . ferrous sulfate 325 (65 FE) MG tablet Take 325 mg by mouth every other day.        Marland Kitchen FLUoxetine (PROZAC) 20 MG capsule Take 20 mg by mouth daily at 12 noon.        . furosemide (LASIX) 40 MG tablet Take 1 tablet (40 mg total) by mouth daily.  30 tablet  11  . levothyroxine (SYNTHROID, LEVOTHROID) 75 MCG tablet Take 75 mcg by mouth daily.        Marland Kitchen LORazepam (ATIVAN) 2 MG tablet Take 2 mg by mouth every 8 (eight) hours as needed. anxiety       . Multiple Vitamins-Minerals (ICAPS PO) Take 1 tablet by mouth  daily.        . nitroGLYCERIN (NITROSTAT) 0.4 MG SL tablet Place 1 tablet (0.4 mg total) under the tongue every 5 (five) minutes as needed for chest pain.  30 tablet  3  . OLANZapine (ZYPREXA) 5 MG tablet Take 5 mg by mouth daily at 12 noon.        . pantoprazole (PROTONIX) 40 MG tablet Take 40 mg by mouth daily. 1 hour before breakfast         Allergies  Allergen Reactions  . Penicillins Swelling and Rash    Past Medical History  Diagnosis Date  . GERD (gastroesophageal reflux disease)   . Glaucoma   . Osteoarthritis   . Hyperlipidemia   . Anxiety   . Depression   . Bronchitis 02/22/2011  . Chronic pain   . Hypothyroidism   . DOE (dyspnea on exertion)     chronic   . CKD (chronic kidney disease) stage 4, GFR 15-29 ml/min   . Physical deconditioning   . Falls   . PAF (paroxysmal atrial fibrillation)     not felt to be a candidate for coumadin  . Hypertension   . CHF (congestive heart failure)     felt to have diastolic dysfunction with normal EF at 60%  .  Fibromyalgia     with chronic weakness    Past Surgical History  Procedure Date  . Hemorrhoid surgery   . Laparoscopic hysterectomy   . Knee surgery     bilateral  . Cesarean section   . Transthoracic echocardiogram 02/2011    EF 60% with diastolic dysfunction, high filling pressures  . Appendectomy     as a teenage  . Cholecystectomy     pt denies having cholecystectomy    History  Smoking status  . Never Smoker   Smokeless tobacco  . Never Used    History  Alcohol Use No    Family History  Problem Relation Age of Onset  . Heart failure Mother   . Stroke Father   . Lymphoma Brother   . Cancer Brother     Review of Systems: The review of systems is positive for chronic weakness. She has chronic arthritis of her spine and knees. She is able to use a walker at home but gets around mostly in a wheelchair outside of home.  All other systems were reviewed and are negative.  Physical Exam: Pulse 64   Ht 5\' 10"  (1.778 m)  Wt 91.536 kg (201 lb 12.8 oz)  BMI 28.96 kg/m2  SpO2 92% Patient is very pleasant and in no acute distress. She does appear chronically ill. She is in a wheelchair. Skin is warm and dry. Color is normal.  HEENT is unremarkable. Normocephalic/atraumatic. PERRL. Sclera are nonicteric. Neck is supple. No masses. No JVD. Lungs are clear. Cardiac exam reveals a regular rate and rhythm without gallop, murmur, or click. Abdomen is obese but soft. Extremities are without edema. Gait is not tested. ROM is ntact. No gross neurologic deficits noted.   LABORATORY DATA:  Laboratory data was reviewed from her hospitalization in May.  Assessment / Plan: 1. Chronic diastolic heart failure. She appears to be well compensated. We will continue with Lasix 40 mg once a day. We'll avoid ACE inhibitors and angiotensin receptor blockers given her history of renal dysfunction.  2. Paroxysmal atrial fibrillation. No recent episodes. Not a candidate for Coumadin because of history of falls.  3. Hypertension, controlled.  4. Chronic kidney disease stage II.

## 2012-03-10 ENCOUNTER — Other Ambulatory Visit: Payer: Self-pay | Admitting: Adult Health

## 2012-07-01 ENCOUNTER — Ambulatory Visit (INDEPENDENT_AMBULATORY_CARE_PROVIDER_SITE_OTHER): Payer: Medicare Other | Admitting: Cardiology

## 2012-07-01 ENCOUNTER — Encounter: Payer: Self-pay | Admitting: Cardiology

## 2012-07-01 VITALS — BP 170/63 | HR 70 | Ht 70.5 in | Wt 215.8 lb

## 2012-07-01 DIAGNOSIS — I480 Paroxysmal atrial fibrillation: Secondary | ICD-10-CM

## 2012-07-01 DIAGNOSIS — I110 Hypertensive heart disease with heart failure: Secondary | ICD-10-CM | POA: Insufficient documentation

## 2012-07-01 DIAGNOSIS — I4891 Unspecified atrial fibrillation: Secondary | ICD-10-CM

## 2012-07-01 DIAGNOSIS — I509 Heart failure, unspecified: Secondary | ICD-10-CM

## 2012-07-01 DIAGNOSIS — I5032 Chronic diastolic (congestive) heart failure: Secondary | ICD-10-CM

## 2012-07-01 DIAGNOSIS — I10 Essential (primary) hypertension: Secondary | ICD-10-CM

## 2012-07-01 NOTE — Progress Notes (Addendum)
Debra Johnston Date of Birth: 1927/08/03 Medical Record #[id number]  History of Present Illness: Debra Johnston is seen for followup today. She has a history of diastolic congestive heart failure. She also has a history of paroxysmal atrial fibrillation. She is not felt to be a candidate for Coumadin because of the history of recurrent falls. She is intolerant to ARB use because of renal insufficiency. On followup today she states that she has some shortness of breath every once in a while but that it's not very bad. She has rare palpitations. She has some mild ankle swelling on the left that goes down at night. She does complain of chronic back pain and insomnia. She has gained 14 pounds.  Current Outpatient Prescriptions on File Prior to Visit  Medication Sig Dispense Refill  . amLODipine (NORVASC) 5 MG tablet Take 1 tablet (5 mg total) by mouth daily.  30 tablet  6  . carvedilol (COREG) 25 MG tablet Take 25 mg by mouth 2 (two) times daily with a meal.        . dorzolamide (TRUSOPT) 2 % ophthalmic solution Place 1 drop into both eyes 2 (two) times daily.  10 mL  6  . ferrous sulfate 325 (65 FE) MG tablet Take 325 mg by mouth every other day.        Marland Kitchen FLUoxetine (PROZAC) 20 MG capsule Take 20 mg by mouth daily at 12 noon.        . furosemide (LASIX) 40 MG tablet Take 1 tablet (40 mg total) by mouth daily.  30 tablet  11  . levothyroxine (SYNTHROID, LEVOTHROID) 75 MCG tablet Take 75 mcg by mouth daily.        Marland Kitchen LORazepam (ATIVAN) 2 MG tablet Take 2 mg by mouth every 8 (eight) hours as needed. anxiety       . Multiple Vitamins-Minerals (ICAPS PO) Take 1 tablet by mouth daily.        . nitroGLYCERIN (NITROSTAT) 0.4 MG SL tablet Place 1 tablet (0.4 mg total) under the tongue every 5 (five) minutes as needed for chest pain.  30 tablet  3  . OLANZapine (ZYPREXA) 5 MG tablet Take 5 mg by mouth daily at 12 noon.        . pantoprazole (PROTONIX) 40 MG tablet Take 40 mg by mouth daily. 1 hour before  breakfast        No current facility-administered medications on file prior to visit.    Allergies  Allergen Reactions  . Penicillins Swelling and Rash    Past Medical History  Diagnosis Date  . GERD (gastroesophageal reflux disease)   . Glaucoma(365)   . Osteoarthritis   . Hyperlipidemia   . Anxiety   . Depression   . Bronchitis 02/22/2011  . Chronic pain   . Hypothyroidism   . DOE (dyspnea on exertion)     chronic   . CKD (chronic kidney disease) stage 4, GFR 15-29 ml/min   . Physical deconditioning   . Falls   . PAF (paroxysmal atrial fibrillation)     not felt to be a candidate for coumadin  . Hypertension   . CHF (congestive heart failure)     felt to have diastolic dysfunction with normal EF at 60%  . Fibromyalgia     with chronic weakness    Past Surgical History  Procedure Laterality Date  . Hemorrhoid surgery    . Laparoscopic hysterectomy    . Knee surgery      bilateral  .  Cesarean section    . Transthoracic echocardiogram  02/2011    EF 60% with diastolic dysfunction, high filling pressures  . Appendectomy      as a teenage  . Cholecystectomy      pt denies having cholecystectomy    History  Smoking status  . Never Smoker   Smokeless tobacco  . Never Used    History  Alcohol Use No    Family History  Problem Relation Age of Onset  . Heart failure Mother   . Stroke Father   . Lymphoma Brother   . Cancer Brother     Review of Systems: As noted in history of present illness.  All other systems were reviewed and are negative.  Physical Exam: BP 170/63  Pulse 70  Ht 5' 10.5" (1.791 m)  Wt 215 lb 12.8 oz (97.886 kg)  BMI 30.52 kg/m2 Patient is very pleasant and in no acute distress. She does appear chronically ill. She is in a wheelchair. Skin is warm and dry. Color is normal.  HEENT is unremarkable. Normocephalic/atraumatic. PERRL. Sclera are nonicteric. Neck is supple. No masses. No JVD. Lungs are clear. Cardiac exam reveals a  regular rate and rhythm without gallop, murmur, or click. Abdomen is obese but soft. Extremities are without edema. Gait is not tested. ROM is ntact. No gross neurologic deficits noted.   LABORATORY DATA:  ECG demonstrates normal sinus rhythm with a normal ECG.  Assessment / Plan: 1. Chronic diastolic heart failure. She appears to be well compensated. We will continue with Lasix 40 mg once a day. We'll avoid ACE inhibitors and angiotensin receptor blockers given her history of renal dysfunction. Continue sodium restriction.  2. Paroxysmal atrial fibrillation. No recent episodes. Not a candidate for Coumadin because of history of falls.  3. Hypertension, controlled.  4. Chronic kidney disease stage II.  I'll plan to followup again in 6 months.

## 2012-07-01 NOTE — Patient Instructions (Signed)
Continue your current therapy  Watch your salt intake.  Keep a record of your blood pressure at home. If it is staying over 150 let me know.  I will see you in 6 months.

## 2012-08-18 ENCOUNTER — Telehealth: Payer: Self-pay | Admitting: Cardiology

## 2012-08-18 NOTE — Telephone Encounter (Signed)
Pt Dropped Off Application For Disability Pla-Card gave to Fernand Parkins Dr.Jordan back in office Thursday  08/18/12/KM

## 2012-09-26 ENCOUNTER — Telehealth: Payer: Self-pay | Admitting: Cardiology

## 2012-09-26 MED ORDER — CARVEDILOL 25 MG PO TABS: 25.0000 mg | ORAL_TABLET | Freq: Two times a day (BID) | ORAL | Status: DC

## 2012-09-26 NOTE — Telephone Encounter (Signed)
Returned call to patient spoke to husband he stated wife has been taking carvedilol for several years and was told by pharmacist insurance will not cover.Spoke to pharmacist at [facility] and she stated coreg refill sent in yesterday 09/25/12 not carvedilol.Carvedilol refilled by phone.

## 2012-09-26 NOTE — Telephone Encounter (Signed)
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 Pt is in need of replacement.

## 2012-10-08 IMAGING — CR DG CHEST 2V
2 series · 2 of 2 positions shown · non-contrast
Comparison: February 22, 2011

CLINICAL DATA: Shortness of breath and cough

CHEST - 2 VIEW

[w chest pa]
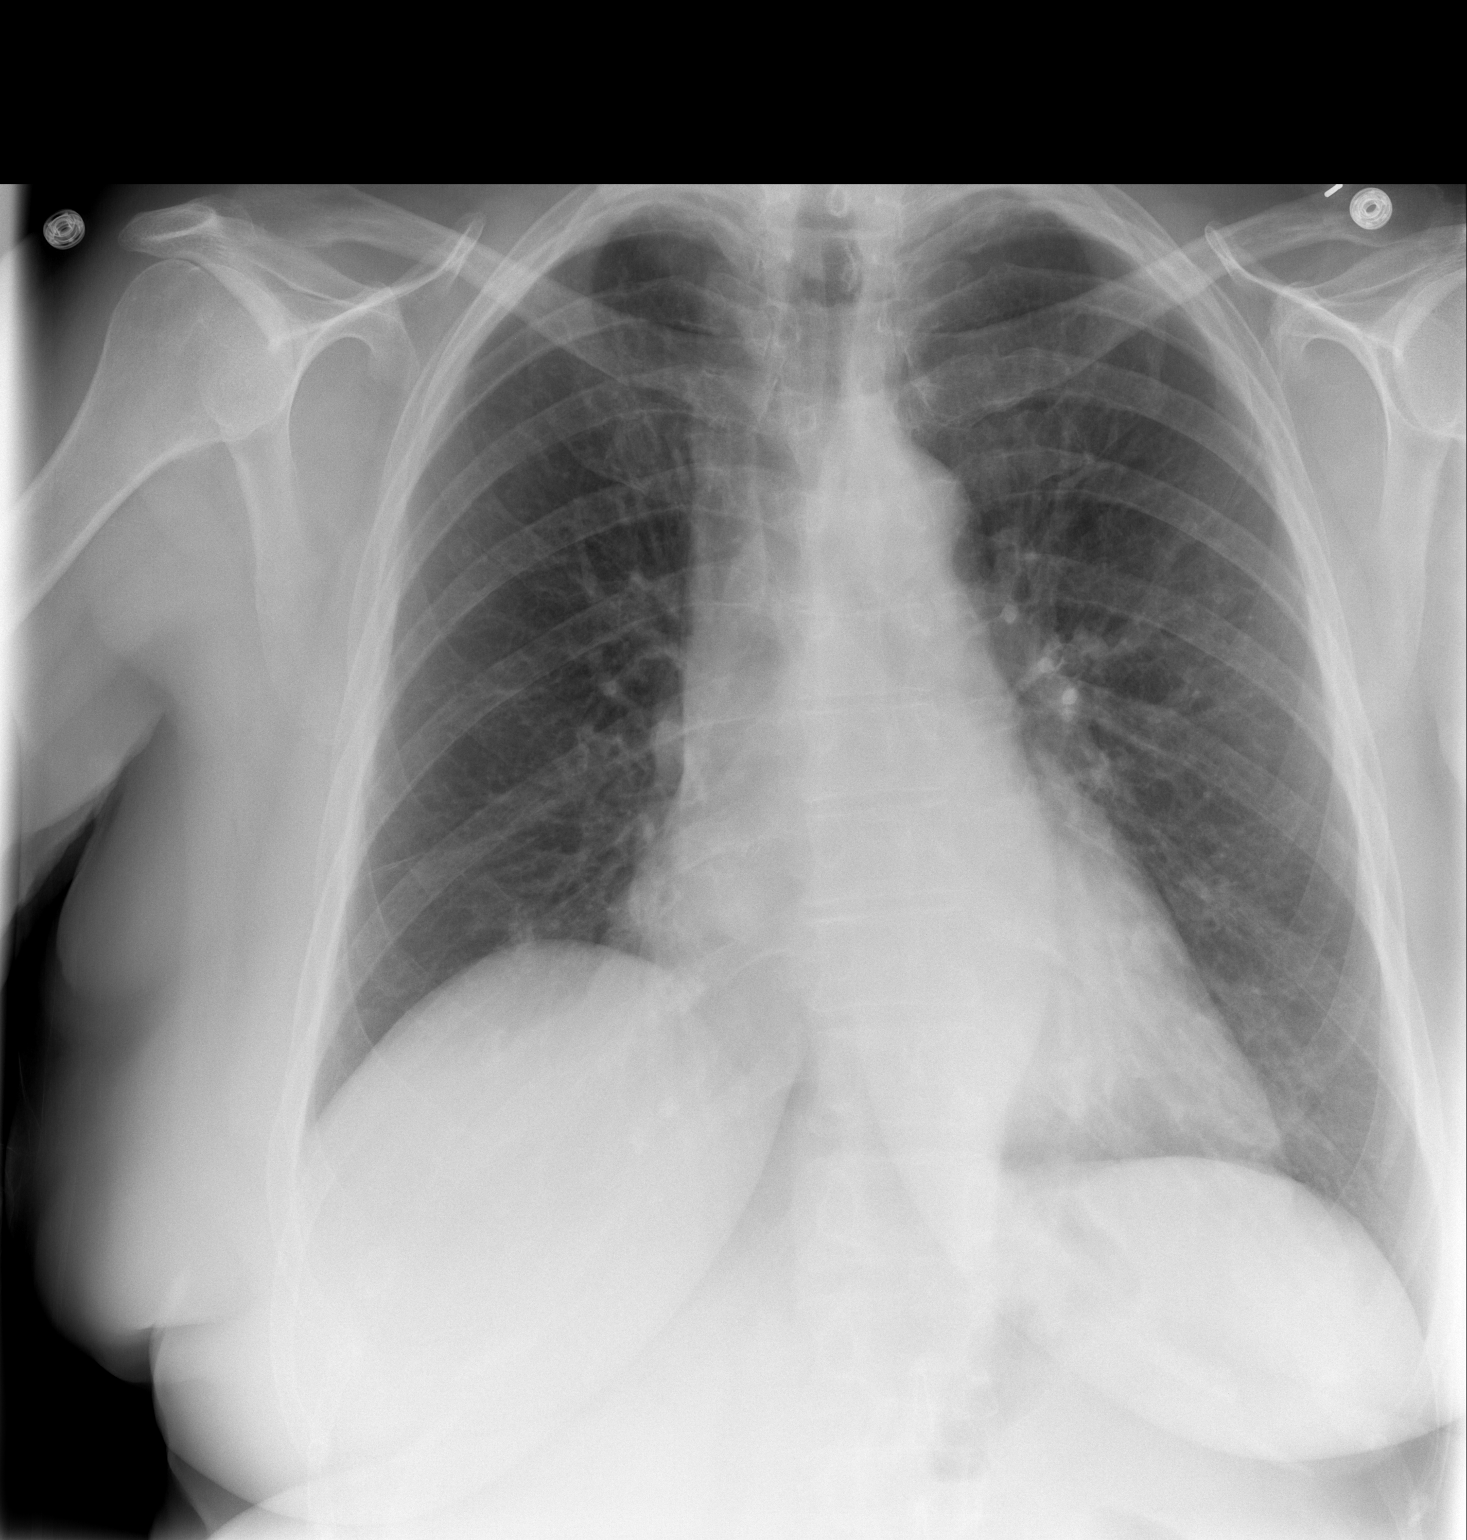

[w chest lat]
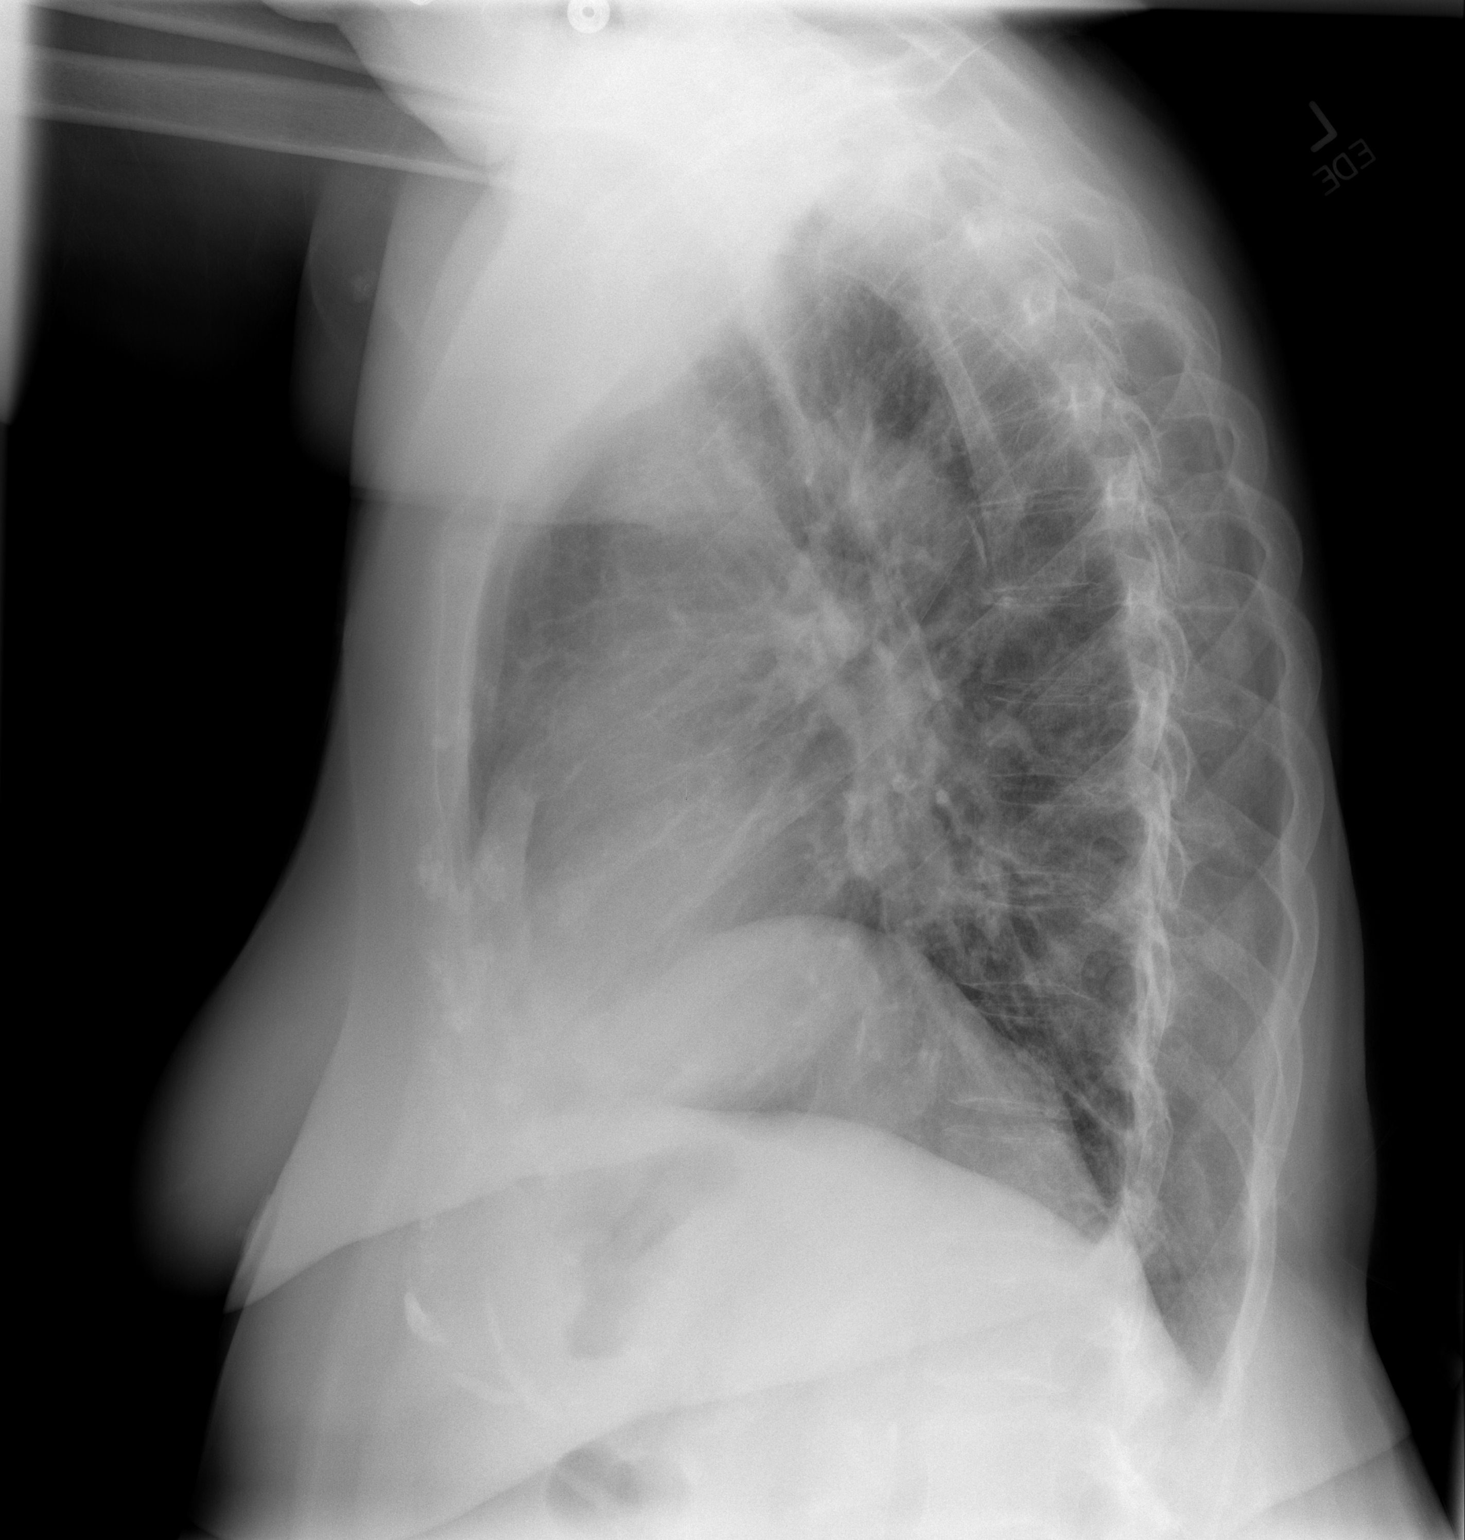

[2 of 2 positions shown; findings below may reference images not displayed]

FINDINGS: The cardiac silhouette, mediastinum, pulmonary
vasculature are within normal limits.  Both lungs are clear.  There
is stable elevation of the right hemi diaphragm. There is no acute
bony abnormality.
IMPRESSION: There is no evidence of acute cardiac or pulmonary process.

## 2013-01-02 ENCOUNTER — Encounter: Payer: Self-pay | Admitting: Cardiology

## 2013-01-02 ENCOUNTER — Ambulatory Visit (INDEPENDENT_AMBULATORY_CARE_PROVIDER_SITE_OTHER): Payer: Medicare Other | Admitting: Cardiology

## 2013-01-02 VITALS — BP 187/67 | HR 66 | Ht 70.5 in | Wt 214.0 lb

## 2013-01-02 DIAGNOSIS — I480 Paroxysmal atrial fibrillation: Secondary | ICD-10-CM

## 2013-01-02 DIAGNOSIS — I509 Heart failure, unspecified: Secondary | ICD-10-CM

## 2013-01-02 DIAGNOSIS — I10 Essential (primary) hypertension: Secondary | ICD-10-CM

## 2013-01-02 DIAGNOSIS — Z23 Encounter for immunization: Secondary | ICD-10-CM

## 2013-01-02 DIAGNOSIS — I5032 Chronic diastolic (congestive) heart failure: Secondary | ICD-10-CM

## 2013-01-02 DIAGNOSIS — I4891 Unspecified atrial fibrillation: Secondary | ICD-10-CM

## 2013-01-02 MED ORDER — LEVOTHYROXINE SODIUM 88 MCG PO TABS: 88.0000 ug | ORAL_TABLET | Freq: Every day | ORAL | Status: AC

## 2013-01-02 NOTE — Progress Notes (Signed)
Debra Johnston Date of Birth: 11-Dec-1927 Medical Record #[id number]  History of Present Illness: Debra Johnston is seen for followup today. She has a history of diastolic congestive heart failure and paroxysmal atrial fibrillation. She is not felt to be a candidate for Coumadin because of the history of recurrent falls. She is intolerant to ARB use because of renal insufficiency. On followup today she states that she has some shortness of breath every once in a while but that it's not very bad. She has very rare palpitations. She has some mild ankle swelling on the left that goes down at night. She has not been monitoring her blood pressure regularly at home.  Current Outpatient Prescriptions on File Prior to Visit  Medication Sig Dispense Refill  . amLODipine (NORVASC) 5 MG tablet Take 1 tablet (5 mg total) by mouth daily.  30 tablet  6  . carvedilol (COREG) 25 MG tablet Take 1 tablet (25 mg total) by mouth 2 (two) times daily with a meal.  180 tablet  3  . dorzolamide (TRUSOPT) 2 % ophthalmic solution Place 1 drop into both eyes 2 (two) times daily.  10 mL  6  . ferrous sulfate 325 (65 FE) MG tablet Take 325 mg by mouth every other day.        Marland Kitchen FLUoxetine (PROZAC) 20 MG capsule Take 20 mg by mouth daily at 12 noon.        . furosemide (LASIX) 40 MG tablet Take 1 tablet (40 mg total) by mouth daily.  30 tablet  11  . LORazepam (ATIVAN) 2 MG tablet Take 2 mg by mouth every 8 (eight) hours as needed. anxiety       . Multiple Vitamins-Minerals (ICAPS PO) Take 1 tablet by mouth daily.        . nitroGLYCERIN (NITROSTAT) 0.4 MG SL tablet Place 1 tablet (0.4 mg total) under the tongue every 5 (five) minutes as needed for chest pain.  30 tablet  3  . OLANZapine (ZYPREXA) 5 MG tablet Take 5 mg by mouth daily at 12 noon.        . pantoprazole (PROTONIX) 40 MG tablet Take 40 mg by mouth daily. 1 hour before breakfast        No current facility-administered medications on file prior to visit.     Allergies  Allergen Reactions  . Penicillins Swelling and Rash    Past Medical History  Diagnosis Date  . GERD (gastroesophageal reflux disease)   . Glaucoma   . Osteoarthritis   . Hyperlipidemia   . Anxiety   . Depression   . Bronchitis 02/22/2011  . Chronic pain   . Hypothyroidism   . DOE (dyspnea on exertion)     chronic   . CKD (chronic kidney disease) stage 4, GFR 15-29 ml/min   . Physical deconditioning   . Falls   . PAF (paroxysmal atrial fibrillation)     not felt to be a candidate for coumadin  . Hypertension   . CHF (congestive heart failure)     felt to have diastolic dysfunction with normal EF at 60%  . Fibromyalgia     with chronic weakness    Past Surgical History  Procedure Laterality Date  . Hemorrhoid surgery    . Laparoscopic hysterectomy    . Knee surgery      bilateral  . Cesarean section    . Transthoracic echocardiogram  02/2011    EF 60% with diastolic dysfunction, high filling pressures  .  Appendectomy      as a teenage  . Cholecystectomy      pt denies having cholecystectomy    History  Smoking status  . Never Smoker   Smokeless tobacco  . Never Used    History  Alcohol Use No    Family History  Problem Relation Age of Onset  . Heart failure Mother   . Stroke Father   . Lymphoma Brother   . Cancer Brother     Review of Systems: As noted in history of present illness.  All other systems were reviewed and are negative.  Physical Exam: BP 187/67  Pulse 66  Ht 5' 10.5" (1.791 m)  Wt 214 lb (97.07 kg)  BMI 30.26 kg/m2 Patient is very pleasant and in no acute distress. She does appear chronically ill. She is in a wheelchair. Skin is warm and dry. Color is normal.  HEENT is unremarkable. Normocephalic/atraumatic. PERRL. Sclera are nonicteric. Neck is supple. No masses. No JVD. Lungs are clear. Cardiac exam reveals a regular rate and rhythm without gallop, murmur, or click. Abdomen is obese but soft. Extremities are with  trace edema. Gait is not tested. ROM is ntact. No gross neurologic deficits noted.   LABORATORY DATA:    Assessment / Plan: 1. Chronic diastolic heart failure. She appears to be well compensated. We will continue with Lasix 40 mg once a day. We'll avoid ACE inhibitors and angiotensin receptor blockers given her history of renal dysfunction. Continue sodium restriction.  2. Paroxysmal atrial fibrillation. No recent episodes. Not a candidate for Coumadin because of history of falls.  3. Hypertension, blood pressure is significantly elevated today. I requested that she check her blood pressure at home and let us know if it was sustained over 150. Her husband is going to check this tonight.  4. Chronic kidney disease stage II.  We will administer a flu shot today. I'll plan to followup again in 6 months.

## 2013-01-02 NOTE — Patient Instructions (Addendum)
Continue your current therapy  Monitor your blood pressure at home and let us know if it's staying high.  I will see you in 6 months.  You will get your flu shot today

## 2013-01-02 NOTE — Addendum Note (Signed)
Addended by: Jama Flavors on: 01/02/2013 01:11 PM   Modules accepted: Orders

## 2013-01-29 ENCOUNTER — Other Ambulatory Visit: Payer: Self-pay | Admitting: *Deleted

## 2013-01-29 MED ORDER — NITROGLYCERIN 0.4 MG SL SUBL: 0.4000 mg | SUBLINGUAL_TABLET | SUBLINGUAL | Status: DC | PRN

## 2013-07-03 ENCOUNTER — Ambulatory Visit (INDEPENDENT_AMBULATORY_CARE_PROVIDER_SITE_OTHER): Payer: Medicare Other | Admitting: Cardiology

## 2013-07-03 ENCOUNTER — Encounter: Payer: Self-pay | Admitting: Cardiology

## 2013-07-03 VITALS — BP 160/66 | HR 76

## 2013-07-03 DIAGNOSIS — I4891 Unspecified atrial fibrillation: Secondary | ICD-10-CM

## 2013-07-03 DIAGNOSIS — I480 Paroxysmal atrial fibrillation: Secondary | ICD-10-CM

## 2013-07-03 DIAGNOSIS — I5033 Acute on chronic diastolic (congestive) heart failure: Secondary | ICD-10-CM

## 2013-07-03 DIAGNOSIS — I5032 Chronic diastolic (congestive) heart failure: Secondary | ICD-10-CM

## 2013-07-03 DIAGNOSIS — I509 Heart failure, unspecified: Secondary | ICD-10-CM

## 2013-07-03 NOTE — Patient Instructions (Signed)
Continue your current therapy  I will see you in 6 months.   

## 2013-07-03 NOTE — Progress Notes (Signed)
Jani GravelFrances R Moen Date of Birth: February 01, 1928 Medical Record #[id number]  History of Present Illness: Ms. Joan Floreslley is seen for followup today. She has a history of diastolic congestive heart failure and paroxysmal atrial fibrillation. She is not felt to be a candidate for Coumadin because of the history of recurrent falls. She is intolerant to ARB use because of renal insufficiency. On followup today she states that she has some mild shortness of breath occasionally. This is unchanged. She has very rare palpitations. She has some mild ankle swelling late in the day. It is hard for her to put support hose on.   Current Outpatient Prescriptions on File Prior to Visit  Medication Sig Dispense Refill  . amLODipine (NORVASC) 5 MG tablet Take 1 tablet (5 mg total) by mouth daily.  30 tablet  6  . carvedilol (COREG) 25 MG tablet Take 1 tablet (25 mg total) by mouth 2 (two) times daily with a meal.  180 tablet  3  . CRESTOR 5 MG tablet Take 5 mg by mouth.       . dorzolamide (TRUSOPT) 2 % ophthalmic solution Place 1 drop into both eyes 2 (two) times daily.  10 mL  6  . ferrous sulfate 325 (65 FE) MG tablet Take 325 mg by mouth every other day.        Marland Kitchen. FLUoxetine (PROZAC) 20 MG capsule Take 20 mg by mouth daily at 12 noon.        Marland Kitchen. levothyroxine (SYNTHROID) 88 MCG tablet Take 1 tablet (88 mcg total) by mouth daily before breakfast.      . LORazepam (ATIVAN) 2 MG tablet Take 2 mg by mouth every 8 (eight) hours as needed. anxiety       . Multiple Vitamins-Minerals (ICAPS PO) Take 1 tablet by mouth daily.        . nitroGLYCERIN (NITROSTAT) 0.4 MG SL tablet Place 1 tablet (0.4 mg total) under the tongue every 5 (five) minutes as needed for chest pain.  25 tablet  3  . OLANZapine (ZYPREXA) 5 MG tablet Take 5 mg by mouth daily at 12 noon.        . pantoprazole (PROTONIX) 40 MG tablet Take 40 mg by mouth daily. 1 hour before breakfast       . furosemide (LASIX) 40 MG tablet Take 1 tablet (40 mg total) by mouth  daily.  30 tablet  11   No current facility-administered medications on file prior to visit.    Allergies  Allergen Reactions  . Penicillins Swelling and Rash    Past Medical History  Diagnosis Date  . GERD (gastroesophageal reflux disease)   . Glaucoma   . Osteoarthritis   . Hyperlipidemia   . Anxiety   . Depression   . Bronchitis 02/22/2011  . Chronic pain   . Hypothyroidism   . DOE (dyspnea on exertion)     chronic   . CKD (chronic kidney disease) stage 4, GFR 15-29 ml/min   . Physical deconditioning   . Falls   . PAF (paroxysmal atrial fibrillation)     not felt to be a candidate for coumadin  . Hypertension   . CHF (congestive heart failure)     felt to have diastolic dysfunction with normal EF at 60%  . Fibromyalgia     with chronic weakness    Past Surgical History  Procedure Laterality Date  . Hemorrhoid surgery    . Laparoscopic hysterectomy    . Knee surgery  bilateral  . Cesarean section    . Transthoracic echocardiogram  02/2011    EF 60% with diastolic dysfunction, high filling pressures  . Appendectomy      as a teenage  . Cholecystectomy      pt denies having cholecystectomy    History  Smoking status  . Never Smoker   Smokeless tobacco  . Never Used    History  Alcohol Use No    Family History  Problem Relation Age of Onset  . Heart failure Mother   . Stroke Father   . Lymphoma Brother   . Cancer Brother     Review of Systems: As noted in history of present illness.  All other systems were reviewed and are negative.  Physical Exam: BP 160/66  Pulse 76 Patient is very pleasant and in no acute distress. She does appear chronically ill. She is in a wheelchair. Skin is warm and dry. Color is normal.  HEENT is unremarkable. Normocephalic/atraumatic. PERRL. Sclera are nonicteric. Neck is supple. No masses. No JVD. Lungs are clear. Cardiac exam reveals a regular rate and rhythm without gallop, murmur, or click. Abdomen is obese  but soft. Extremities are with trace edema. Gait is not tested. ROM is ntact. No gross neurologic deficits noted.   LABORATORY DATA:  Ecg: NSR poor voltage in precordial leads. No acute change.  Assessment / Plan: 1. Chronic diastolic heart failure. She appears to be well compensated. We will continue with Lasix 40 mg once a day. May take an extra lasix if her swelling worsens. We'll avoid ACE inhibitors and angiotensin receptor blockers given her history of renal dysfunction. Continue sodium restriction.  2. Paroxysmal atrial fibrillation. No recent episodes. Not a candidate for Coumadin because of history of falls.  3. Hypertension, reports better BP at home.  4. Chronic kidney disease stage II.  We will administer a flu shot today. I'll plan to followup again in 6 months.

## 2013-10-09 ENCOUNTER — Encounter (HOSPITAL_COMMUNITY): Payer: Self-pay | Admitting: Emergency Medicine

## 2013-10-09 ENCOUNTER — Emergency Department (HOSPITAL_COMMUNITY): Payer: Medicare Other

## 2013-10-09 ENCOUNTER — Observation Stay (HOSPITAL_COMMUNITY)
Admission: EM | Admit: 2013-10-09 | Discharge: 2013-10-11 | Disposition: A | Discharge status: Home / Self Care | Payer: Medicare Other | Attending: Cardiology | Admitting: Cardiology

## 2013-10-09 DIAGNOSIS — I130 Hypertensive heart and chronic kidney disease with heart failure and stage 1 through stage 4 chronic kidney disease, or unspecified chronic kidney disease: Secondary | ICD-10-CM | POA: Diagnosis not present

## 2013-10-09 DIAGNOSIS — K219 Gastro-esophageal reflux disease without esophagitis: Secondary | ICD-10-CM | POA: Insufficient documentation

## 2013-10-09 DIAGNOSIS — I5030 Unspecified diastolic (congestive) heart failure: Secondary | ICD-10-CM | POA: Diagnosis present

## 2013-10-09 DIAGNOSIS — I509 Heart failure, unspecified: Secondary | ICD-10-CM

## 2013-10-09 DIAGNOSIS — I5033 Acute on chronic diastolic (congestive) heart failure: Secondary | ICD-10-CM | POA: Diagnosis not present

## 2013-10-09 DIAGNOSIS — G8929 Other chronic pain: Secondary | ICD-10-CM | POA: Diagnosis not present

## 2013-10-09 DIAGNOSIS — Z79899 Other long term (current) drug therapy: Secondary | ICD-10-CM | POA: Insufficient documentation

## 2013-10-09 DIAGNOSIS — M1990 Unspecified osteoarthritis, unspecified site: Secondary | ICD-10-CM | POA: Diagnosis not present

## 2013-10-09 DIAGNOSIS — H409 Unspecified glaucoma: Secondary | ICD-10-CM | POA: Insufficient documentation

## 2013-10-09 DIAGNOSIS — E663 Overweight: Secondary | ICD-10-CM | POA: Insufficient documentation

## 2013-10-09 DIAGNOSIS — Z823 Family history of stroke: Secondary | ICD-10-CM | POA: Diagnosis not present

## 2013-10-09 DIAGNOSIS — Z8249 Family history of ischemic heart disease and other diseases of the circulatory system: Secondary | ICD-10-CM | POA: Diagnosis not present

## 2013-10-09 DIAGNOSIS — Z6830 Body mass index (BMI) 30.0-30.9, adult: Secondary | ICD-10-CM | POA: Insufficient documentation

## 2013-10-09 DIAGNOSIS — F329 Major depressive disorder, single episode, unspecified: Secondary | ICD-10-CM | POA: Diagnosis not present

## 2013-10-09 DIAGNOSIS — R0902 Hypoxemia: Secondary | ICD-10-CM

## 2013-10-09 DIAGNOSIS — I480 Paroxysmal atrial fibrillation: Secondary | ICD-10-CM

## 2013-10-09 DIAGNOSIS — E039 Hypothyroidism, unspecified: Secondary | ICD-10-CM | POA: Diagnosis not present

## 2013-10-09 DIAGNOSIS — N184 Chronic kidney disease, stage 4 (severe): Secondary | ICD-10-CM | POA: Insufficient documentation

## 2013-10-09 DIAGNOSIS — IMO0001 Reserved for inherently not codable concepts without codable children: Secondary | ICD-10-CM | POA: Insufficient documentation

## 2013-10-09 DIAGNOSIS — I4891 Unspecified atrial fibrillation: Secondary | ICD-10-CM | POA: Insufficient documentation

## 2013-10-09 DIAGNOSIS — F411 Generalized anxiety disorder: Secondary | ICD-10-CM | POA: Diagnosis not present

## 2013-10-09 DIAGNOSIS — I110 Hypertensive heart disease with heart failure: Secondary | ICD-10-CM

## 2013-10-09 DIAGNOSIS — F3289 Other specified depressive episodes: Secondary | ICD-10-CM | POA: Insufficient documentation

## 2013-10-09 DIAGNOSIS — I5032 Chronic diastolic (congestive) heart failure: Secondary | ICD-10-CM | POA: Insufficient documentation

## 2013-10-09 DIAGNOSIS — N189 Chronic kidney disease, unspecified: Principal | ICD-10-CM

## 2013-10-09 DIAGNOSIS — E785 Hyperlipidemia, unspecified: Secondary | ICD-10-CM | POA: Insufficient documentation

## 2013-10-09 LAB — BASIC METABOLIC PANEL
Anion gap: 16 — ABNORMAL HIGH (ref 5–15)
BUN: 15 mg/dL (ref 6–23)
CO2: 25 meq/L (ref 19–32)
Calcium: 10.5 mg/dL (ref 8.4–10.5)
Chloride: 102 mEq/L (ref 96–112)
Creatinine, Ser: 0.94 mg/dL (ref 0.50–1.10)
GFR calc Af Amer: 63 mL/min — ABNORMAL LOW (ref >90)
GFR calc non Af Amer: 54 mL/min — ABNORMAL LOW (ref >90)
GLUCOSE: 106 mg/dL — AB (ref 70–99)
POTASSIUM: 4.1 meq/L (ref 3.7–5.3)
SODIUM: 143 meq/L (ref 137–147)

## 2013-10-09 LAB — CBC
HEMATOCRIT: 43.7 % (ref 36.0–46.0)
HEMOGLOBIN: 14.1 g/dL (ref 12.0–15.0)
MCH: 30.3 pg (ref 26.0–34.0)
MCHC: 32.3 g/dL (ref 30.0–36.0)
MCV: 94 fL (ref 78.0–100.0)
Platelets: 287 10*3/uL (ref 150–400)
RBC: 4.65 MIL/uL (ref 3.87–5.11)
RDW: 14.4 % (ref 11.5–15.5)
WBC: 9 10*3/uL (ref 4.0–10.5)

## 2013-10-09 LAB — I-STAT TROPONIN, ED: TROPONIN I, POC: 0.01 ng/mL (ref 0.00–0.08)

## 2013-10-09 LAB — PRO B NATRIURETIC PEPTIDE: Pro B Natriuretic peptide (BNP): 617.7 pg/mL — ABNORMAL HIGH (ref 0–450)

## 2013-10-09 LAB — D-DIMER, QUANTITATIVE (NOT AT ARMC): D DIMER QUANT: 0.47 ug{FEU}/mL (ref 0.00–0.48)

## 2013-10-09 MED ORDER — CARVEDILOL 25 MG PO TABS
25.0000 mg | ORAL_TABLET | Freq: Two times a day (BID) | ORAL | Status: DC
  Administered 2013-10-10 – 2013-10-11 (×3): 25 mg via ORAL
  Filled 2013-10-09 (×5): qty 1

## 2013-10-09 MED ORDER — FLUOXETINE HCL 20 MG PO CAPS
20.0000 mg | ORAL_CAPSULE | Freq: Every day | ORAL | Status: DC
  Administered 2013-10-10 – 2013-10-11 (×2): 20 mg via ORAL
  Filled 2013-10-09 (×2): qty 1

## 2013-10-09 MED ORDER — FUROSEMIDE 10 MG/ML IJ SOLN
40.0000 mg | Freq: Once | INTRAMUSCULAR | Status: AC
  Administered 2013-10-09: 40 mg via INTRAVENOUS
  Filled 2013-10-09: qty 4

## 2013-10-09 MED ORDER — LEVOTHYROXINE SODIUM 88 MCG PO TABS
88.0000 ug | ORAL_TABLET | Freq: Every day | ORAL | Status: DC
  Administered 2013-10-10 – 2013-10-11 (×2): 88 ug via ORAL
  Filled 2013-10-09 (×3): qty 1

## 2013-10-09 MED ORDER — ACETAMINOPHEN 325 MG PO TABS
650.0000 mg | ORAL_TABLET | ORAL | Status: DC | PRN
  Administered 2013-10-10 – 2013-10-11 (×2): 650 mg via ORAL
  Filled 2013-10-09 (×2): qty 2

## 2013-10-09 MED ORDER — LORAZEPAM 1 MG PO TABS
2.0000 mg | ORAL_TABLET | Freq: Three times a day (TID) | ORAL | Status: DC | PRN
  Administered 2013-10-10 (×2): 2 mg via ORAL
  Filled 2013-10-09 (×2): qty 2

## 2013-10-09 MED ORDER — ONDANSETRON HCL 4 MG/2ML IJ SOLN: 4.0000 mg | Freq: Four times a day (QID) | INTRAMUSCULAR | Status: DC | PRN

## 2013-10-09 MED ORDER — SODIUM CHLORIDE 0.9 % IJ SOLN
3.0000 mL | Freq: Two times a day (BID) | INTRAMUSCULAR | Status: DC
  Administered 2013-10-10 – 2013-10-11 (×4): 3 mL via INTRAVENOUS

## 2013-10-09 MED ORDER — SODIUM CHLORIDE 0.9 % IV SOLN: 250.0000 mL | INTRAVENOUS | Status: DC | PRN

## 2013-10-09 MED ORDER — ALBUTEROL SULFATE (2.5 MG/3ML) 0.083% IN NEBU
5.0000 mg | INHALATION_SOLUTION | Freq: Once | RESPIRATORY_TRACT | Status: AC
  Administered 2013-10-09: 5 mg via RESPIRATORY_TRACT
  Filled 2013-10-09: qty 6

## 2013-10-09 MED ORDER — AMLODIPINE BESYLATE 5 MG PO TABS
5.0000 mg | ORAL_TABLET | Freq: Every day | ORAL | Status: DC
  Administered 2013-10-10 – 2013-10-11 (×2): 5 mg via ORAL
  Filled 2013-10-09 (×2): qty 1

## 2013-10-09 MED ORDER — SODIUM CHLORIDE 0.9 % IJ SOLN: 3.0000 mL | INTRAMUSCULAR | Status: DC | PRN

## 2013-10-09 MED ORDER — HYDROCODONE-ACETAMINOPHEN 5-325 MG PO TABS
1.0000 | ORAL_TABLET | Freq: Once | ORAL | Status: AC
  Administered 2013-10-09: 1 via ORAL
  Filled 2013-10-09: qty 1

## 2013-10-09 NOTE — ED Notes (Signed)
Debra HatchetSheila in main lab states she will add on d-dimer

## 2013-10-09 NOTE — ED Provider Notes (Signed)
CSN: [id number]     Arrival date & time 10/09/13  1658 History   First MD Initiated Contact with Patient 10/09/13 1701     No chief complaint on file.    (Consider location/radiation/quality/duration/timing/severity/associated sxs/prior Treatment) HPI Comments: Patient presents with shortness of breath. She has a history of diastolic heart failure and paroxysmal atrial fibrillation. She states over the last week she's had some increased shortness of breath on minimal exertion. She also has increased orthopnea. She had some intermittent tightness to the Center of her chest. She denies any chest pain or tightness currently. She has some leg swelling but feels it is at baseline. She's had a little bit of a cough but this is nonproductive. She denies any fevers or chills. Her oxygen saturation was slightly low on arrival at 90% and she was placed on nasal cannula oxygen. She denies any change in her medications. She states she has been taking her medications. She denies any increased sodium intake. Her cardiologist is Dr. Swaziland.   Past Medical History  Diagnosis Date  . GERD (gastroesophageal reflux disease)   . Glaucoma   . Osteoarthritis   . Hyperlipidemia   . Anxiety   . Depression   . Bronchitis 02/22/2011  . Chronic pain   . Hypothyroidism   . DOE (dyspnea on exertion)     chronic   . CKD (chronic kidney disease) stage 4, GFR 15-29 ml/min   . Physical deconditioning   . Falls   . PAF (paroxysmal atrial fibrillation)     not felt to be a candidate for coumadin  . Hypertension   . CHF (congestive heart failure)     felt to have diastolic dysfunction with normal EF at 60%  . Fibromyalgia     with chronic weakness   Past Surgical History  Procedure Laterality Date  . Hemorrhoid surgery    . Laparoscopic hysterectomy    . Knee surgery      bilateral  . Cesarean section    . Transthoracic echocardiogram  02/2011    EF 60% with diastolic dysfunction, high filling pressures  .  Appendectomy      as a teenage  . Cholecystectomy      pt denies having cholecystectomy   Family History  Problem Relation Age of Onset  . Heart failure Mother   . Stroke Father   . Lymphoma Brother   . Cancer Brother    History  Substance Use Topics  . Smoking status: Never Smoker   . Smokeless tobacco: Never Used  . Alcohol Use: No   OB History   Grav Para Term Preterm Abortions TAB SAB Ect Mult Living                 Review of Systems  Constitutional: Positive for fatigue. Negative for fever, chills and diaphoresis.  HENT: Negative for congestion, rhinorrhea and sneezing.   Eyes: Negative.   Respiratory: Positive for chest tightness, shortness of breath and wheezing. Negative for cough.   Cardiovascular: Positive for leg swelling. Negative for chest pain.  Gastrointestinal: Negative for nausea, vomiting, abdominal pain, diarrhea and blood in stool.  Genitourinary: Negative for frequency, hematuria, flank pain and difficulty urinating.  Musculoskeletal: Negative for arthralgias and back pain.  Skin: Negative for rash.  Neurological: Negative for dizziness, speech difficulty, weakness, numbness and headaches.      Allergies  Penicillins  Home Medications   Prior to Admission medications   Medication Sig Start Date End Date Taking? Authorizing Provider  carvedilol (COREG) 25 MG tablet Take 25 mg by mouth 2 (two) times daily with a meal. 09/26/12  Yes Peter M Swaziland, MD  Cholecalciferol (VITAMIN D PO) Take 1 tablet by mouth daily.   Yes Historical Provider, MD  CRESTOR 5 MG tablet Take 5 mg by mouth daily.  12/25/12  Yes Historical Provider, MD  Cyanocobalamin (VITAMIN B-12 PO) Take 1 tablet by mouth daily.   Yes Historical Provider, MD  ferrous sulfate 325 (65 FE) MG tablet Take 325 mg by mouth every other day.     Yes Historical Provider, MD  FLUoxetine (PROZAC) 20 MG capsule Take 20 mg by mouth daily at 12 noon.     Yes Historical Provider, MD  levothyroxine  (SYNTHROID) 88 MCG tablet Take 1 tablet (88 mcg total) by mouth daily before breakfast. 01/02/13  Yes Peter M Sw[place], MD  LORazepam (ATIVAN) 2 MG tablet Take 2 mg by mouth every 8 (eight) hours as needed. anxiety    Yes Historical Provider, MD  Multiple Vitamins-Minerals (ICAPS PO) Take 1 tablet by mouth daily.     Yes Historical Provider, MD  nitroGLYCERIN (NITROSTAT) 0.4 MG SL tablet Place 1 tablet (0.4 mg total) under the tongue every 5 (five) minutes as needed for chest pain. 01/29/13 11/30/14 Yes Peter M [place], MD  pantoprazole (PROTONIX) 40 MG tablet Take 40 mg by mouth daily. 1 hour before breakfast    Yes Historical Provider, MD  amLODipine (NORVASC) 5 MG tablet Take 1 tablet (5 mg total) by mouth daily. 03/04/11 07/03/13  Jodelle Gross, NP  dorzolamide (TRUSOPT) 2 % ophthalmic solution Place 1 drop into both eyes 2 (two) times daily. 03/04/11 07/03/13  Jodelle Gross, NP  furosemide (LASIX) 40 MG tablet Take 1 tablet (40 mg total) by mouth daily. 04/18/11 01/02/13  Rosalio Macadamia, NP   BP 129/58  Pulse 77  Temp(Src) 98.1 F (36.7 C) (Oral)  Resp 19  Ht 5\' 10"  (1.778 m)  Wt 214 lb (97.07 kg)  BMI 30.71 kg/m2  SpO2 94% Physical Exam  Constitutional: She is oriented to person, place, and time. She appears well-developed and well-nourished.  HENT:  Head: Normocephalic and atraumatic.  Eyes: Pupils are equal, round, and reactive to light.  Neck: Normal range of motion. Neck supple.  Cardiovascular: Normal rate, regular rhythm and normal heart sounds.   Pulmonary/Chest: Effort normal. No respiratory distress. She has no wheezes. She has rales (few crackles in the bases with bilateral expiratory wheezing in the bases.). She exhibits no tenderness.  Abdominal: Soft. Bowel sounds are normal. There is no tenderness. There is no rebound and no guarding.  Musculoskeletal: Normal range of motion. She exhibits edema (2+ pain edema bilaterally).  Lymphadenopathy:    She has no cervical  adenopathy.  Neurological: She is alert and oriented to person, place, and time.  Skin: Skin is warm and dry. No rash noted.  Psychiatric: She has a normal mood and affect.    ED Course  Procedures (including critical care time) Labs Review Results for orders placed during the hospital encounter of 10/09/13  CBC      Result Value Ref Range   WBC 9.0  4.0 - 10.5 K/uL   RBC 4.65  3.87 - 5.11 MIL/uL   Hemoglobin 14.1  12.0 - 15.0 g/dL   HCT 16.1  09.6 - 04.5 %   MCV 94.0  78.0 - 100.0 fL   MCH 30.3  26.0 - 34.0 pg   MCHC 32.3  30.0 - 36.0 g/dL   RDW 62.114.4  30.811.5 - 65.715.5 %   Platelets 287  150 - 400 K/uL  BASIC METABOLIC PANEL      Result Value Ref Range   Sodium 143  137 - 147 mEq/L   Potassium 4.1  3.7 - 5.3 mEq/L   Chloride 102  96 - 112 mEq/L   CO2 25  19 - 32 mEq/L   Glucose, Bld 106 (*) 70 - 99 mg/dL   BUN 15  6 - 23 mg/dL   Creatinine, Ser 8.460.94  0.50 - 1.10 mg/dL   Calcium 96.210.5  8.4 - 95.210.5 mg/dL   GFR calc non Af Amer 54 (*) >90 mL/min   GFR calc Af Amer 63 (*) >90 mL/min   Anion gap 16 (*) 5 - 15  PRO B NATRIURETIC PEPTIDE      Result Value Ref Range   Pro B Natriuretic peptide (BNP) 617.7 (*) 0 - 450 pg/mL  D-DIMER, QUANTITATIVE      Result Value Ref Range   D-Dimer, Quant 0.47  0.00 - 0.48 ug/mL-FEU  I-STAT TROPOININ, ED      Result Value Ref Range   Troponin i, poc 0.01  0.00 - 0.08 ng/mL   Comment 3            Dg Chest Port 1 View  10/09/2013   CLINICAL DATA:  Shortness of Breath  EXAM: PORTABLE CHEST - 1 VIEW  COMPARISON:  06/18/2011  FINDINGS: Cardiomegaly. No acute infiltrate or pulmonary edema. Again noted chronic elevation of the right hemidiaphragm.  IMPRESSION: No active disease.  Chronic elevation of the right hemidiaphragm.   Electronically Signed   By: Natasha MeadLiviu  Pop M.D.   On: 10/09/2013 17:24      Imaging Review Dg Chest Port 1 View  10/09/2013   CLINICAL DATA:  Shortness of Breath  EXAM: PORTABLE CHEST - 1 VIEW  COMPARISON:  06/18/2011  FINDINGS:  Cardiomegaly. No acute infiltrate or pulmonary edema. Again noted chronic elevation of the right hemidiaphragm.  IMPRESSION: No active disease.  Chronic elevation of the right hemidiaphragm.   Electronically Signed   By: Natasha MeadLiviu  Pop M.D.   On: 10/09/2013 17:24     EKG Interpretation   Date/Time:  Friday October 09 2013 16:59:23 EDT Ventricular Rate:  83 PR Interval:  185 QRS Duration: 94 QT Interval:  401 QTC Calculation: 471 R Axis:   -37 Text Interpretation:  Sinus arrhythmia Left axis deviation Minimal ST  depression since last tracing no significant change Confirmed by Venicia Vandall   MD, Chany Woolworth ([id number]) on 10/09/2013 5:14:45 PM      MDM   Final diagnoses:  CHF exacerbation    Patient presents with shortness of breath and some chest tightness. Her oxygen saturation was getting down into the 80s on room air. She was maintained on nasal cannula. I did give her any albuterol treatment which did not significantly improve her symptoms. Her d-dimer is negative. Her chest x-ray is negative for pneumonia or pulmonary edema. Her BNP is elevated. Her troponin is negative. Cardiology has seen the patient and will bring her in for admission. I did give her a dose of Lasix in the ED.    Rolan BuccoMelanie Mujtaba Bollig, MD 10/09/13 2212

## 2013-10-09 NOTE — Progress Notes (Signed)
Patient is alert and oriented x 4, denies complaint of pain/discomfort at present time. Lungs are clear to ausculation throughout all fields. Heart rate and rhythm are regular. No edema is noted to patient's extremities. Abdomen is soft, non-tender with + bowel sounds noted x 4 quads. Skin is intact. Patient states she feels weak, but denies having difficulty breathing at present time. Maintained on 2 liters of oxygen via nasal cannula. Pulse oxygen is 96 % on these 2 liters. Patient oriented t oroom and call bell system. Will continue to monitor. Sharlene Doryanya Tzvi Economou, RN

## 2013-10-09 NOTE — H&P (Signed)
Admission History and Physical     Patient ID: Debra Johnston, MRN: [id number], DOB: 12-09-1927 80 y.o. Date of Encounter: 10/09/2013, 10:27 PM  Primary Physician: Miguel Aschoff, MD Primary Cardiologist: Dr. Swaziland  Chief Complaint: shortness of breath  History of Present Illness: Debra Johnston is a 80 y.o. female with a history of HFpEF, PAF, CKD stage II who presents with one week of worsening shortness of breath.  She chronically has spells of shortness of breath, but her DOE has been significantly worse over the past week.  She can barely walk through her house without feeling out of breath.  Also with significant orthpnea and PND, both of which are worse over the past week.  She denies CP, worsening palpitations, cough, fevers, chills, lightheadedness, vomiting, diarrhea, or edema.  No clear exacerbating factors, she adheres to low salt diet and takes lasix 40mg  daily.    In the ED she was found to have hypoxia at rest on room air to 88% on pulse ox. CXR was clear.  Labs, including BNP are at baseline.  D-Dimer negative.  She was given 40mg  lasix IV.    Past Medical History  Diagnosis Date  . GERD (gastroesophageal reflux disease)   . Glaucoma   . Osteoarthritis   . Hyperlipidemia   . Anxiety   . Depression   . Bronchitis 02/22/2011  . Chronic pain   . Hypothyroidism   . DOE (dyspnea on exertion)     chronic   . CKD (chronic kidney disease) stage 4, GFR 15-29 ml/min   . Physical deconditioning   . Falls   . PAF (paroxysmal atrial fibrillation)     not felt to be a candidate for coumadin  . Hypertension   . CHF (congestive heart failure)     felt to have diastolic dysfunction with normal EF at 60%  . Fibromyalgia     with chronic weakness     Past Surgical History  Procedure Laterality Date  . Hemorrhoid surgery    . Laparoscopic hysterectomy    . Knee surgery      bilateral  . Cesarean section    . Transthoracic echocardiogram  02/2011    EF 60% with  diastolic dysfunction, high filling pressures  . Appendectomy      as a teenage  . Cholecystectomy      pt denies having cholecystectomy      No current facility-administered medications for this encounter.   Current Outpatient Prescriptions  Medication Sig Dispense Refill  . carvedilol (COREG) 25 MG tablet Take 25 mg by mouth 2 (two) times daily with a meal.      . Cholecalciferol (VITAMIN D PO) Take 1 tablet by mouth daily.      . CRESTOR 5 MG tablet Take 5 mg by mouth daily.       . Cyanocobalamin (VITAMIN B-12 PO) Take 1 tablet by mouth daily.      . ferrous sulfate 325 (65 FE) MG tablet Take 325 mg by mouth every other day.        Marland Kitchen FLUoxetine (PROZAC) 20 MG capsule Take 20 mg by mouth daily at 12 noon.        Marland Kitchen levothyroxine (SYNTHROID) 88 MCG tablet Take 1 tablet (88 mcg total) by mouth daily before breakfast.      . LORazepam (ATIVAN) 2 MG tablet Take 2 mg by mouth every 8 (eight) hours as needed. anxiety       . Multiple Vitamins-Minerals (ICAPS PO) Take  1 tablet by mouth daily.        . nitroGLYCERIN (NITROSTAT) 0.4 MG SL tablet Place 1 tablet (0.4 mg total) under the tongue every 5 (five) minutes as needed for chest pain.  25 tablet  3  . pantoprazole (PROTONIX) 40 MG tablet Take 40 mg by mouth daily. 1 hour before breakfast       . amLODipine (NORVASC) 5 MG tablet Take 1 tablet (5 mg total) by mouth daily.  30 tablet  6  . dorzolamide (TRUSOPT) 2 % ophthalmic solution Place 1 drop into both eyes 2 (two) times daily.  10 mL  6  . furosemide (LASIX) 40 MG tablet Take 1 tablet (40 mg total) by mouth daily.  30 tablet  11      Allergies: Allergies  Allergen Reactions  . Penicillins Swelling and Rash     Social History:  The patient  reports that she has never smoked. She has never used smokeless tobacco. She reports that she does not drink alcohol or use illicit drugs.   Family History:  The patient's family history includes Cancer in her brother; Heart failure in her  mother; Lymphoma in her brother; Stroke in her father.   ROS:  Please see the history of present illness. All other systems reviewed and negative.   Vital Signs: Blood pressure 129/58, pulse 77, temperature 98.1 F (36.7 C), temperature source Oral, resp. rate 19, height 5\' 10"  (1.778 m), weight 97.07 kg (214 lb), SpO2 94.00%.  PHYSICAL EXAM: General:  Well nourished, well developed, in no acute distress HEENT: normal Lymph: no adenopathy Neck: no JVD Endocrine:  No thryomegaly Vascular: No carotid bruits; Cardiac:  normal S1, S2; RRR; no murmur Lungs:  clear to auscultation bilaterally, no wheezing, rhonchi or rales Abd: soft, nontender, no hepatomegaly Ext: no edema Musculoskeletal:  No deformities, BUE and BLE strength normal and equal Skin: warm and dry Neuro:  CNs 2-12 intact, no focal abnormalities noted Psych:  Normal affect   EKG:   Sinus arrythmia without evidence of ischemia  Labs:   Lab Results  Component Value Date   WBC 9.0 10/09/2013   HGB 14.1 10/09/2013   HCT 43.7 10/09/2013   MCV 94.0 10/09/2013   PLT 287 10/09/2013    Recent Labs Lab 10/09/13 1714  NA 143  K 4.1  CL 102  CO2 25  BUN 15  CREATININE 0.94  CALCIUM 10.5  GLUCOSE 106*   No results found for this basename: CKTOTAL, CKMB, TROPONINI,  in the last 72 hours No results found for this basename: CHOL, HDL, LDLCALC, TRIG   Lab Results  Component Value Date   DDIMER 0.47 10/09/2013   BNP    Component Value Date/Time   PROBNP 617.7* 10/09/2013 1714    Radiology/Studies:  Dg Chest Port 1 View  10/09/2013   CLINICAL DATA:  Shortness of Breath  EXAM: PORTABLE CHEST - 1 VIEW  COMPARISON:  06/18/2011  FINDINGS: Cardiomegaly. No acute infiltrate or pulmonary edema. Again noted chronic elevation of the right hemidiaphragm.  IMPRESSION: No active disease.  Chronic elevation of the right hemidiaphragm.   Electronically Signed   By: Natasha MeadLiviu  Pop M.D.   On: 10/09/2013 17:24     ASSESSMENT AND PLAN:    1. Hypoxia with history of HFpEF - Her subjective history is concerning for HFpEF however she appears euvolemic on exam and her BNP is the same as it was in May when she last saw Dr. SwazilandJordan. - She received 40mg   IV lasix in ED.  Will reassess her oxygen status/dyspnea in the morning.  - Last echo in 2013 with LVH and elevated LAp.  Will repeat echo tomorrow. - Per chart review had normal PFTs in 2013 - She may need home O2 if she does not improve with lasix overnight. - BMP ordered for the morning, Cr at baseline. - Continue coreg, amlodipine - could consider ACE-I instead of amlodipine given improved Cr  2. PAF - in sinus with some sinus arrythmia - anticoagulation held in past due to falling.  3. Hypothyroid - continue home dose synthroid.   Signed,  Yaakov Guthrie, MD 10/09/2013, 10:27 PM

## 2013-10-09 NOTE — ED Notes (Signed)
Pt placed on 2L nasal cannula due to oxygen sats dropping to 88%

## 2013-10-09 NOTE — ED Notes (Signed)
Per EMS: pt from home for eval of increased sob x1 week, denies any cp. Pt also reports chronic lower back pain with radiation down left leg. Pt denies any n/v/fevers or abnormal swelling. Nad noted.

## 2013-10-09 NOTE — ED Notes (Signed)
Attempted report x1. 

## 2013-10-10 DIAGNOSIS — I110 Hypertensive heart disease with heart failure: Secondary | ICD-10-CM

## 2013-10-10 DIAGNOSIS — I5033 Acute on chronic diastolic (congestive) heart failure: Secondary | ICD-10-CM

## 2013-10-10 DIAGNOSIS — I509 Heart failure, unspecified: Secondary | ICD-10-CM

## 2013-10-10 LAB — BASIC METABOLIC PANEL
Anion gap: 14 (ref 5–15)
BUN: 13 mg/dL (ref 6–23)
CALCIUM: 10.1 mg/dL (ref 8.4–10.5)
CO2: 28 mEq/L (ref 19–32)
CREATININE: 0.94 mg/dL (ref 0.50–1.10)
Chloride: 101 mEq/L (ref 96–112)
GFR calc Af Amer: 63 mL/min — ABNORMAL LOW (ref >90)
GFR calc non Af Amer: 54 mL/min — ABNORMAL LOW (ref >90)
GLUCOSE: 106 mg/dL — AB (ref 70–99)
Potassium: 3.8 mEq/L (ref 3.7–5.3)
Sodium: 143 mEq/L (ref 137–147)

## 2013-10-10 MED ORDER — FUROSEMIDE 10 MG/ML IJ SOLN
40.0000 mg | Freq: Once | INTRAMUSCULAR | Status: AC
  Administered 2013-10-10: 40 mg via INTRAVENOUS
  Filled 2013-10-10: qty 4

## 2013-10-10 MED ORDER — TRAMADOL HCL 50 MG PO TABS
50.0000 mg | ORAL_TABLET | Freq: Four times a day (QID) | ORAL | Status: DC | PRN
  Administered 2013-10-10 (×3): 50 mg via ORAL
  Filled 2013-10-10 (×4): qty 1

## 2013-10-10 NOTE — Progress Notes (Signed)
SUBJECTIVE: The patient is doing a little better today.  Her primary concern is with difficulty sleeping and chronic stable back pain.  Her SOB is slightly better but continues to be an issue.  At this time, she denies chest pain or any new concerns.  Marland Kitchen amLODipine  5 mg Oral Daily  . carvedilol  25 mg Oral BID WC  . FLUoxetine  20 mg Oral Q1200  . furosemide  40 mg Intravenous Once  . levothyroxine  88 mcg Oral QAC breakfast  . sodium chloride  3 mL Intravenous Q12H      OBJECTIVE: Physical Exam: Filed Vitals:   10/09/13 2230 10/09/13 2245 10/09/13 2324 10/10/13 0655  BP: 119/73 129/47 155/69 134/71  Pulse: 73 66 67 65  Temp:   97.7 F (36.5 C) 98 F (36.7 C)  TempSrc:   Oral Oral  Resp:   20 20  Height:    (1.778 m)   Weight:   220 lb 11.2 oz (100.109 kg) 220 lb 1.6 oz (99.837 kg)  SpO2: 95% 93% 98% 96%    Intake/Output Summary (Last 24 hours) at 10/10/13 1006 Last data filed at 10/10/13 0843  Gross per 24 hour  Intake    360 ml  Output   1275 ml  Net   -915 ml    Telemetry reveals sinus rhythm  GEN- The patient is overweight and elderly appearing, alert and oriented x 3 today.   Head- normocephalic, atraumatic Eyes-  Sclera clear, conjunctiva pink Ears- hearing intact Oropharynx- clear Neck- supple, + JVD (10cm) Lungs- few basilar rales, normal work of breathing Heart- Regular rate and rhythm with ectopy, no murmurs, rubs or gallops, PMI not laterally displaced GI- soft, NT, ND, + BS Extremities- no clubbing, cyanosis, + dependant edema (mild) Skin- no rash or lesion Psych- euthymic mood, full affect Neuro- strength and sensation are intact  LABS: Basic Metabolic Panel:  Recent Labs  16/10/96 1714 10/10/13 0342  NA 143 143  K 4.1 3.8  CL 102 101  CO2 25 28  GLUCOSE 106* 106*  BUN 15 13  CREATININE 0.94 0.94  CALCIUM 10.5 10.1   Liver Function Tests: No results found for this basename: AST, ALT, ALKPHOS, BILITOT, PROT, ALBUMIN,  in the  last 72 hours No results found for this basename: LIPASE, AMYLASE,  in the last 72 hours CBC:  Recent Labs  10/09/13 1714  WBC 9.0  HGB 14.1  HCT 43.7  MCV 94.0  PLT 287   Cardiac Enzymes: No results found for this basename: CKTOTAL, CKMB, CKMBINDEX, TROPONINI,  in the last 72 hours BNP: No components found with this basename: POCBNP,  D-Dimer:  Recent Labs  10/09/13 1714  DDIMER 0.47   Hemoglobin A1C: No results found for this basename: HGBA1C,  in the last 72 hours Fasting Lipid Panel: No results found for this basename: CHOL, HDL, LDLCALC, TRIG, CHOLHDL, LDLDIRECT,  in the last 72 hours Thyroid Function Tests: No results found for this basename: TSH, T4TOTAL, FREET3, T3FREE, THYROIDAB,  in the last 72 hours Anemia Panel: No results found for this basename: VITAMINB12, FOLATE, FERRITIN, TIBC, IRON, RETICCTPCT,  in the last 72 hours  RADIOLOGY: Dg Chest Port 1 View  10/09/2013   CLINICAL DATA:  Shortness of Breath  EXAM: PORTABLE CHEST - 1 VIEW  COMPARISON:  06/18/2011  FINDINGS: Cardiomegaly. No acute infiltrate or pulmonary edema. Again noted chronic elevation of the right hemidiaphragm.  IMPRESSION: No active disease.  Chronic elevation of the  right hemidiaphragm.   Electronically Signed   By: Natasha Mead M.D.   On: 10/09/2013 17:24    ASSESSMENT AND PLAN:  Principal Problem:   Acute on chronic diastolic heart failure Active Problems:   PAF (paroxysmal atrial fibrillation)   (HFpEF) heart failure with preserved ejection fraction  80 yo presented with acute on chronic diastolic CHF.  1. CHF: Acute on chronic diastolic CHF.  Give another dose of IV lasix today Strict I/Os, daily weights, 2 gram sodium restriction Echo is pending  2. Hypertensive cardiovascular disease with CHF Lasix IV today May add an ace if BP allows  Hopefully home within next 24 hours or so.  She is not ready to go today.  Hillis Range, MD 10/10/2013 10:06 AM

## 2013-10-10 NOTE — Progress Notes (Signed)
UR completed 

## 2013-10-11 ENCOUNTER — Observation Stay (HOSPITAL_COMMUNITY): Payer: Medicare Other

## 2013-10-11 DIAGNOSIS — I369 Nonrheumatic tricuspid valve disorder, unspecified: Secondary | ICD-10-CM

## 2013-10-11 DIAGNOSIS — I4891 Unspecified atrial fibrillation: Secondary | ICD-10-CM

## 2013-10-11 DIAGNOSIS — I509 Heart failure, unspecified: Secondary | ICD-10-CM

## 2013-10-11 LAB — BASIC METABOLIC PANEL
Anion gap: 15 (ref 5–15)
BUN: 22 mg/dL (ref 6–23)
CALCIUM: 10.1 mg/dL (ref 8.4–10.5)
CO2: 26 meq/L (ref 19–32)
CREATININE: 1.27 mg/dL — AB (ref 0.50–1.10)
Chloride: 100 mEq/L (ref 96–112)
GFR calc Af Amer: 44 mL/min — ABNORMAL LOW (ref >90)
GFR, EST NON AFRICAN AMERICAN: 38 mL/min — AB (ref >90)
GLUCOSE: 118 mg/dL — AB (ref 70–99)
Potassium: 3.5 mEq/L — ABNORMAL LOW (ref 3.7–5.3)
Sodium: 141 mEq/L (ref 137–147)

## 2013-10-11 MED ORDER — FUROSEMIDE 40 MG PO TABS
40.0000 mg | ORAL_TABLET | Freq: Every day | ORAL | Status: DC
  Administered 2013-10-11: 40 mg via ORAL
  Filled 2013-10-11: qty 1

## 2013-10-11 MED ORDER — TRAMADOL HCL 50 MG PO TABS
50.0000 mg | ORAL_TABLET | ORAL | Status: DC | PRN
  Administered 2013-10-11 (×2): 50 mg via ORAL
  Filled 2013-10-11 (×2): qty 1

## 2013-10-11 MED ORDER — TRAMADOL HCL 50 MG PO TABS: 50.0000 mg | ORAL_TABLET | Freq: Four times a day (QID) | ORAL | Status: DC | PRN

## 2013-10-11 MED ORDER — AMLODIPINE BESYLATE 5 MG PO TABS: 5.0000 mg | ORAL_TABLET | Freq: Every day | ORAL | Status: DC

## 2013-10-11 MED ORDER — FUROSEMIDE 40 MG PO TABS: 40.0000 mg | ORAL_TABLET | Freq: Every day | ORAL | Status: DC

## 2013-10-11 NOTE — Progress Notes (Signed)
SUBJECTIVE: The patient is doing a little better today.  Her primary concern is with chronic stable back pain.  Her SOB is better.  At this time, she denies chest pain or any new concerns.  Marland Kitchen amLODipine  5 mg Oral Daily  . carvedilol  25 mg Oral BID WC  . FLUoxetine  20 mg Oral Q1200  . furosemide  40 mg Oral Daily  . levothyroxine  88 mcg Oral QAC breakfast  . sodium chloride  3 mL Intravenous Q12H      OBJECTIVE: Physical Exam: Filed Vitals:   10/10/13 1430 10/10/13 2205 10/11/13 0158 10/11/13 0530  BP: 134/59 136/56 124/43 139/54  Pulse: 69 62 66 67  Temp: 97.7 F (36.5 C) 98.1 F (36.7 C) 97.8 F (36.6 C) 98.2 F (36.8 C)  TempSrc: Oral Oral Oral Oral  Resp: Height:      Weight:    219 lb 6.4 oz (99.519 kg)  SpO2: 94% 95% 96% 95%    Intake/Output Summary (Last 24 hours) at 10/11/13 0950 Last data filed at 10/11/13 0530  Gross per 24 hour  Intake    320 ml  Output   1475 ml  Net  -1155 ml    Telemetry reveals sinus rhythm  GEN- The patient is overweight and elderly appearing, alert and oriented x 3 today.   Head- normocephalic, atraumatic Eyes-  Sclera clear, conjunctiva pink Ears- hearing intact Oropharynx- clear Neck- supple, + JVD (8cm) Lungs- few basilar rales, normal work of breathing Heart- Regular rate and rhythm with ectopy, no murmurs, rubs or gallops, PMI not laterally displaced GI- soft, NT, ND, + BS Extremities- no clubbing, cyanosis, + dependant edema (mild) Neuro- strength and sensation are intact  LABS: Basic Metabolic Panel:  Recent Labs  16/10/96 0342 10/11/13 0428  NA 143 141  K 3.8 3.5*  CL 101 100  CO2 28 26  GLUCOSE 106* 118*  BUN 13 22  CREATININE 0.94 1.27*  CALCIUM 10.1 10.1   Liver Function Tests: No results found for this basename: AST, ALT, ALKPHOS, BILITOT, PROT, ALBUMIN,  in the last 72 hours No results found for this basename: LIPASE, AMYLASE,  in the last 72 hours CBC:  Recent Labs   10/09/13 1714  WBC 9.0  HGB 14.1  HCT 43.7  MCV 94.0  PLT 287   Cardiac Enzymes: No results found for this basename: CKTOTAL, CKMB, CKMBINDEX, TROPONINI,  in the last 72 hours BNP: No components found with this basename: POCBNP,  D-Dimer:  Recent Labs  10/09/13 1714  DDIMER 0.47   RADIOLOGY: Dg Chest Port 1 View  10/09/2013   CLINICAL DATA:  Shortness of Breath  EXAM: PORTABLE CHEST - 1 VIEW  COMPARISON:  06/18/2011  FINDINGS: Cardiomegaly. No acute infiltrate or pulmonary edema. Again noted chronic elevation of the right hemidiaphragm.  IMPRESSION: No active disease.  Chronic elevation of the right hemidiaphragm.   Electronically Signed   By: Natasha Mead M.D.   On: 10/09/2013 17:24    ASSESSMENT AND PLAN:  Principal Problem:   Acute on chronic diastolic heart failure Active Problems:   PAF (paroxysmal atrial fibrillation)   (HFpEF) heart failure with preserved ejection fraction  80 yo presented with acute on chronic diastolic CHF.  1. CHF: Acute on chronic diastolic CHF.  Resume oral lasix Strict I/Os, daily weights, 2 gram sodium restriction Echo is pending, may need to be done as an outpatient if not done this am Assess need  for home O2 prior to discharge  2. Hypertensive cardiovascular disease with CHF Start lisinopril  daily  DC to home Needs transition of care with PA/NP or Dr Swazil[place]  Ayden Apodaca, MD 10/11/2013 9:50 AM

## 2013-10-11 NOTE — Care Management Note (Addendum)
    Page 1 of 1   10/11/2013     12:51:02 PM CARE MANAGEMENT NOTE 10/11/2013  Patient:  Debra Johnston, Debra Johnston   Account Number:  [id number]  Date Initiated:  10/11/2013  Documentation initiated by:  [facility]  Subjective/Objective Assessment:   adm:  SOB     Action/Plan:   discharge plannning   Anticipated DC Date:  10/11/2013   Anticipated DC Plan:  HOME W HOME HEALTH SERVICES      DC Planning Services  CM consult      [facility] Choice  HOME HEALTH   Choice offered to / List presented to:  C-1 Patient   DME arranged  OXYGEN      DME agency  [facility]     [facility] arranged  HH-1 RN  HH-10 DISEASE MANAGEMENT      Status of service:  Completed, signed off Medicare Important Message given?  YES (If response is "NO", the following Medicare IM given date fields will be blank) Date Medicare IM given:  10/09/2013 Medicare IM given by:   Date Additional Medicare IM given:   Additional Medicare IM given by:    Discharge Disposition:  HOME/SELF CARE  Per UR Regulation:    If discussed at Long Length of Stay Meetings, dates discussed:    Comments:  10/11/13 12:05 Cm recei ved call from RN to arrange home oxygen.  CM called DME rep who states he will deliver to room prior to discharge. Pt to receive HHRN dis mgmt from [facility].  CM texted referral to [facility] rep, winnie.   No other Cm needs were communicated.  Freddy Jaksch, BSN, CM [phone].

## 2013-10-11 NOTE — Progress Notes (Signed)
  Echocardiogram 2D Echocardiogram has been performed.  Debra Johnston 10/11/2013, 10:46 AM

## 2013-10-11 NOTE — Progress Notes (Signed)
Patient discharged home with husband. Discharge teaching to husband and patient. Prescription given. Patient verbalized understanding of worsening signs of heart failure and when to call the doctor.    Ok Anis, RN 10/11/2013

## 2013-10-11 NOTE — Discharge Summary (Signed)
Physician Discharge Summary    Cardiologist:  Martinique  Patient ID: Debra Johnston MRN: [id number] DOB/AGE: Aug 01, 1927 80 y.o.  Admit date: 10/09/2013 Discharge date: 10/11/2013  Admission Diagnoses: Acute on chronic diastolic heart failure  Discharge Diagnoses:  Principal Problem:   Acute on chronic diastolic heart failure Active Problems:   PAF (paroxysmal atrial fibrillation)   (HFpEF) heart failure with preserved ejection fraction   HTN   Hypothyroidism  Discharged Condition: stable  Hospital Course:   Debra Johnston is a 80 y.o. female with a history of HFpEF, PAF, CKD stage II who presents with one week of worsening shortness of breath. She chronically has spells of shortness of breath, but her DOE has been significantly worse over the past week. She can barely walk through her house without feeling out of breath. Also with significant orthpnea and PND, both of which are worse over the past week. She denies CP, worsening palpitations, cough, fevers, chills, lightheadedness, vomiting, diarrhea, or edema. No clear exacerbating factors, she adheres to low salt diet and takes lasix 46m daily.   In the ED she was found to have hypoxia at rest on room air to 88% on pulse ox. CXR was clear. Labs, including BNP are at baseline. D-Dimer negative.  Amlodipine and coreg were continued.  She was given 454mlasix IV.  She was then admitted and continued on IV diuretic therapy.  This resulted in net fluid loss of -2.1 liters.  Oral lasix was resumed.  The patient was reminded of sodium restriction.  Lisinopril 43m82mas started.  Ambulatory O2 sats were checked and she met the requirements for home O2.   2D echocardiogram revealed an EF of 55-60% The patient was seen by Dr. AllRayann Hemano felt she was stable for DC home.    Consults: None  Significant Diagnostic Studies:  PORTABLE CHEST - 1 VIEW  COMPARISON: 06/18/2011  FINDINGS:  Cardiomegaly. No acute infiltrate or pulmonary edema. Again noted   chronic elevation of the right hemidiaphragm.  IMPRESSION:  No active disease. Chronic elevation of the right hemidiaphragm.  LEFT HIP - COMPLETE 2+ VIEW  COMPARISON: None.  FINDINGS:  No fracture or dislocation is noted. Mild narrowing of the left hip  joint is noted. Severe narrowing of the right hip joint is noted.  IMPRESSION:  Mild degenerative joint disease of the left hip. Severe degenerative  joint disease of the right hip. No acute abnormality seen involving  the hips.   Echo Study Conclusions  - Left ventricle: The cavity size was normal. Wall thickness was increased in a pattern of moderate LVH. Systolic function was normal. The estimated ejection fraction was in the range of 55% to 60%. Left ventricular diastolic function parameters were normal. - Mitral valve: Calcified annulus. Mildly thickened leaflets . - Atrial septum: No defect or patent foramen ovale was identified.  Treatments: See above  Discharge Exam: Blood pressure 111/32, pulse 59, temperature 98 F (36.7 C), temperature source Oral, resp. rate 18, height 5' 10"  (1.778 m), weight 219 lb 6.4 oz (99.519 kg), SpO2 95.00%.   Disposition: 01-Home or Self Care     Medication List    TAKE these medications       amLODipine 5 MG tablet  Commonly known as:  NORVASC  Take 1 tablet (5 mg total) by mouth daily.     amLODipine 5 MG tablet  Commonly known as:  NORVASC  Take 1 tablet (5 mg total) by mouth daily.     carvedilol  25 MG tablet  Commonly known as:  COREG  Take 25 mg by mouth 2 (two) times daily with a meal.     CRESTOR 5 MG tablet  Generic drug:  rosuvastatin  Take 5 mg by mouth daily.     dorzolamide 2 % ophthalmic solution  Commonly known as:  TRUSOPT  Place 1 drop into both eyes 2 (two) times daily.     ferrous sulfate 325 (65 FE) MG tablet  Take 325 mg by mouth every other day.     FLUoxetine 20 MG capsule  Commonly known as:  PROZAC  Take 20 mg by mouth daily at 12 noon.      furosemide 40 MG tablet  Commonly known as:  LASIX  Take 1 tablet (40 mg total) by mouth daily.     furosemide 40 MG tablet  Commonly known as:  LASIX  Take 1 tablet (40 mg total) by mouth daily.     ICAPS PO  Take 1 tablet by mouth daily.     levothyroxine 88 MCG tablet  Commonly known as:  SYNTHROID  Take 1 tablet (88 mcg total) by mouth daily before breakfast.     LORazepam 2 MG tablet  Commonly known as:  ATIVAN  Take 2 mg by mouth every 8 (eight) hours as needed. anxiety     pantoprazole 40 MG tablet  Commonly known as:  PROTONIX  Take 40 mg by mouth daily. 1 hour before breakfast     VITAMIN B-12 PO  Take 1 tablet by mouth daily.     VITAMIN D PO  Take 1 tablet by mouth daily.      ASK your doctor about these medications       nitroGLYCERIN 0.4 MG SL tablet  Commonly known as:  NITROSTAT  Place 1 tablet (0.4 mg total) under the tongue every 5 (five) minutes as needed for chest pain.       Follow-up Information   Follow up with Peter Martinique, MD. (the office will call you with the follo wup appt date and time. )    Specialty:  Cardiology   Contact information:   [address] [place] [address] [phone]      Greater than 30 minutes was spent completing the patient's discharge.     SignedTarri Fuller, PAC 10/11/2013, 10:30 AM  Thompson Grayer MD

## 2013-10-11 NOTE — Progress Notes (Signed)
SATURATION QUALIFICATIONS: (This note is used to comply with regulatory documentation for home oxygen)  Patient Saturations on Room Air at Rest = 96%  Patient Saturations on Room Air while Ambulating = 88%  Patient Saturations on 2 Liters of oxygen while Ambulating = 96%  Please briefly explain why patient needs home oxygen: Pt also lightheaded and dizzy. Md made aware.

## 2013-10-11 NOTE — Progress Notes (Signed)
Utilization Review Completed.   Montee Tallman, RN, BSN Nurse Case Manager  

## 2013-10-11 NOTE — Progress Notes (Signed)
Patient relocated to room 8, secondary to the overhead bed light being broken, and patient being unable to sleep with the light on. Patient complaining of chronic hip pain. Given a heating pad to apply to the area, to which she said was helpful in alleviating the pain. Will continue to monitor.  Sharlene Dory, RN

## 2013-10-13 ENCOUNTER — Telehealth: Payer: Self-pay | Admitting: Cardiology

## 2013-10-13 NOTE — Telephone Encounter (Signed)
Per after Hours Pt is TCM Scheduled for Sept 1, 2015 @ 12pm. With Lorin Picket Weaver//

## 2013-10-13 NOTE — Telephone Encounter (Signed)
Patient's husband, Jerolyn Shin, contacted regarding discharge from [facility] on 10/11/13.  Patient understands to follow up with provider Tereso Newcomer, PA-c on 10/20/13 at 12:00 at [address] [facility] [address]. I spoke with The pts husband concerning the pt. Patient's husband understands discharge instructions? Yes  Patient's husband understands medications and regiment? Yes Patient's husband understands to bring all medications to this visit? Yes

## 2013-10-20 ENCOUNTER — Encounter: Payer: Self-pay | Admitting: Physician Assistant

## 2013-10-20 ENCOUNTER — Telehealth: Payer: Self-pay | Admitting: *Deleted

## 2013-10-20 ENCOUNTER — Ambulatory Visit (INDEPENDENT_AMBULATORY_CARE_PROVIDER_SITE_OTHER): Payer: Medicare Other | Admitting: Physician Assistant

## 2013-10-20 VITALS — BP 150/60 | HR 68 | Temp 99.0°F | Ht 70.0 in | Wt 219.0 lb

## 2013-10-20 DIAGNOSIS — I5032 Chronic diastolic (congestive) heart failure: Secondary | ICD-10-CM

## 2013-10-20 DIAGNOSIS — I480 Paroxysmal atrial fibrillation: Secondary | ICD-10-CM

## 2013-10-20 DIAGNOSIS — R110 Nausea: Secondary | ICD-10-CM

## 2013-10-20 DIAGNOSIS — I10 Essential (primary) hypertension: Secondary | ICD-10-CM

## 2013-10-20 DIAGNOSIS — R0602 Shortness of breath: Secondary | ICD-10-CM

## 2013-10-20 DIAGNOSIS — N289 Disorder of kidney and ureter, unspecified: Secondary | ICD-10-CM

## 2013-10-20 DIAGNOSIS — I4891 Unspecified atrial fibrillation: Secondary | ICD-10-CM

## 2013-10-20 LAB — CBC WITH DIFFERENTIAL/PLATELET
Basophils Absolute: 0 10*3/uL (ref 0.0–0.1)
Basophils Relative: 0.3 % (ref 0.0–3.0)
EOS ABS: 0.2 10*3/uL (ref 0.0–0.7)
Eosinophils Relative: 1.9 % (ref 0.0–5.0)
HCT: 41.7 % (ref 36.0–46.0)
Hemoglobin: 13.9 g/dL (ref 12.0–15.0)
LYMPHS PCT: 26 % (ref 12.0–46.0)
Lymphs Abs: 3.3 10*3/uL (ref 0.7–4.0)
MCHC: 33.3 g/dL (ref 30.0–36.0)
MCV: 90.5 fl (ref 78.0–100.0)
MONOS PCT: 9.2 % (ref 3.0–12.0)
Monocytes Absolute: 1.2 10*3/uL — ABNORMAL HIGH (ref 0.1–1.0)
NEUTROS ABS: 7.9 10*3/uL — AB (ref 1.4–7.7)
Neutrophils Relative %: 62.6 % (ref 43.0–77.0)
PLATELETS: 392 10*3/uL (ref 150.0–400.0)
RBC: 4.61 Mil/uL (ref 3.87–5.11)
RDW: 13.8 % (ref 11.5–15.5)
WBC: 12.6 10*3/uL — ABNORMAL HIGH (ref 4.0–10.5)

## 2013-10-20 LAB — BASIC METABOLIC PANEL
BUN: 10 mg/dL (ref 6–23)
CALCIUM: 10.1 mg/dL (ref 8.4–10.5)
CO2: 27 meq/L (ref 19–32)
CREATININE: 1 mg/dL (ref 0.4–1.2)
Chloride: 101 mEq/L (ref 96–112)
GFR: 56.1 mL/min — AB (ref >60.00)
GLUCOSE: 106 mg/dL — AB (ref 70–99)
Potassium: 2.6 mEq/L — CL (ref 3.5–5.1)
Sodium: 140 mEq/L (ref 135–145)

## 2013-10-20 LAB — BRAIN NATRIURETIC PEPTIDE: PRO B NATRI PEPTIDE: 91 pg/mL (ref 0.0–100.0)

## 2013-10-20 MED ORDER — POTASSIUM CHLORIDE CRYS ER 20 MEQ PO TBCR: EXTENDED_RELEASE_TABLET | ORAL | Status: DC

## 2013-10-20 NOTE — Telephone Encounter (Signed)
Received call from lab with critical potassium 2.6.  Creatinine is 1.0. Reviewed with Tereso Newcomer, [facility] Orders received for patient to take potassium 80 meq today, 80 meq tomorrow and 40 meq on Thursday and come back for repeat BMET Thursday. Explained this to patient and Sharmaine Bain on the phone. Understanding verbalized and this plan was repeated back to me by Mr. Lewing. Patient already has potassium 20 mEq at home so will begin now. Patient c/o being very weak, tired today.  Tereso Newcomer informed. Appt made for lab work Thursday.

## 2013-10-20 NOTE — Progress Notes (Signed)
Cardiology Office Note    Date:  10/20/2013   ID:  Debra Johnston, DOB 06-01-1927, MRN [id number]  PCP:  Miguel Aschoff, MD  Cardiologist:  Dr. Peter Swaziland     History of Present Illness: Debra Johnston is a 80 y.o. female with a hx of PAF, diastolic CHF, CKD, HL.  She is not on Coumadin due to hx of falls.  ARB stopped in the past due to increasing creatinine.  Admitted 8/21-8/23 with a/c diastolic CHF.  She was noted to be hypoxic upon presentation with O2 88%.  DDimer was negative.  CXR was clear.  She was diuresed.  She was DC on O2.  She returns for FU.  She is feeling weak and nauseated since DC.  Breathing is improved.  But, she is still short of breath.  She sleeps on one pillow.  She thinks she has awoken short of breath (?PND).  She denies edema.  She denies chest pain or syncope.       Studies:  - Echo (8/15):  Mod LVH, EF 55-60%   Recent Labs/Images: 10/09/2013: Hemoglobin 14.1; Pro B Natriuretic peptide (BNP) 617.7*  10/11/2013: Creatinine 1.27*; Potassium 3.5* 10/09/2013: D-Dimer 0.47    Dg Chest Port 1 View  10/09/2013     IMPRESSION: No active disease.  Chronic elevation of the right hemidiaphragm.   Electronically Signed   By: Natasha Mead M.D.   On: 10/09/2013 17:24     Wt Readings from Last 3 Encounters:  10/20/13 219 lb (99.338 kg)  10/11/13 219 lb 6.4 oz (99.519 kg)  01/02/13 214 lb (97.07 kg)     Past Medical History  Diagnosis Date  . GERD (gastroesophageal reflux disease)   . Glaucoma   . Osteoarthritis   . Hyperlipidemia   . Anxiety   . Depression   . Bronchitis 02/22/2011  . Chronic pain   . Hypothyroidism   . DOE (dyspnea on exertion)     chronic   . CKD (chronic kidney disease) stage 4, GFR 15-29 ml/min   . Physical deconditioning   . Falls   . PAF (paroxysmal atrial fibrillation)     not felt to be a candidate for coumadin  . Hypertension   . CHF (congestive heart failure)     felt to have diastolic dysfunction with normal EF at 60%  .  Fibromyalgia     with chronic weakness    Current Outpatient Prescriptions  Medication Sig Dispense Refill  . amLODipine (NORVASC) 5 MG tablet Take 1 tablet (5 mg total) by mouth daily.  30 tablet  5  . carvedilol (COREG) 25 MG tablet Take 25 mg by mouth 2 (two) times daily with a meal.      . Cholecalciferol (VITAMIN D PO) Take 1 tablet by mouth daily.      . CRESTOR 5 MG tablet Take 5 mg by mouth daily.       . Cyanocobalamin (VITAMIN B-12 PO) Take 1 tablet by mouth daily.      . dorzolamide (TRUSOPT) 2 % ophthalmic solution Place 1 drop into both eyes 2 (two) times daily.  10 mL  6  . ferrous sulfate 325 (65 FE) MG tablet Take 325 mg by mouth every other day.        Marland Kitchen FLUoxetine (PROZAC) 20 MG capsule Take 20 mg by mouth daily at 12 noon.        . furosemide (LASIX) 40 MG tablet Take 1 tablet (40 mg total) by mouth  daily.  30 tablet  5  . levothyroxine (SYNTHROID) 88 MCG tablet Take 1 tablet (88 mcg total) by mouth daily before breakfast.      . LORazepam (ATIVAN) 2 MG tablet Take 2 mg by mouth every 8 (eight) hours as needed. anxiety       . Multiple Vitamins-Minerals (ICAPS PO) Take 1 tablet by mouth daily.        . nitroGLYCERIN (NITROSTAT) 0.4 MG SL tablet Place 1 tablet (0.4 mg total) under the tongue every 5 (five) minutes as needed for chest pain.  25 tablet  3  . pantoprazole (PROTONIX) 40 MG tablet Take 40 mg by mouth daily. 1 hour before breakfast        No current facility-administered medications for this visit.     Allergies:   Penicillins   Social History:  The patient  reports that she has never smoked. She has never used smokeless tobacco. She reports that she does not drink alcohol or use illicit drugs.   Family History:  The patient's family history includes Cancer in her brother; Heart attack in her mother; Heart failure in her mother; Lymphoma in her brother; Stroke in her father.   ROS:  Please see the history of present illness.   She feels nauseated.  She  notes chills.  She has dysphagia with pills.  No bleeding.   All other systems reviewed and negative.   PHYSICAL EXAM: VS:  BP 150/60  Pulse 68  Temp(Src) 99 F (37.2 C)  Ht  (1.778 m)  Wt 219 lb (99.338 kg)  BMI 31.42 kg/m2  SpO2 98% Well nourished, well developed female.  She appears tachypneic at times (?anxiety) HEENT: normal Neck: no JVDat 90 Cardiac:  normal S1, S2; RRR; no murmur Lungs:  clear to auscultation bilaterally, no wheezing, rhonchi or rales Abd: soft, nontender, no hepatomegaly Ext: no edema Skin: warm and dry Neuro:  CNs 2-12 intact, no focal abnormalities noted  EKG:  NSR, HR 68, normal axis, nonspecific ST-T wave changes, QTc 448     ASSESSMENT AND PLAN:  1. Chronic diastolic heart failure:  Volume appears stable.  She is short of breath at times.  She is not that active.  She complains of weakness.  Suspect deconditioning in addition to diastolic CHF.  Will check BMET and BNP today.  Adjust Lasix if needed.  Continue O2.   2. Essential hypertension:  Borderline elevated.  Monitor for now.  3. PAF (paroxysmal atrial fibrillation):  Maintaining NSR.  She is not a candidate for anticoagulation.   4. Renal insufficiency:  Repeat BMET today.  5. Nausea:  She has noted nausea, weakness, chills since DC.  Check CBC, BMET, BNP.  She tells me she used to be on K+ at home.  This was stopped for unclear reasons.  Question if she is hypoK+.  QT is ok on ECG.  If labs ok, FU with PCP.  6. Disposition:  FU with Dr. Peter Swaziland in 1 month.    Signed, Brynda Rim, MHS 10/20/2013 1:22 PM    [facility] [address] [place], [place], [place]  [address] Phone: [phone]; Fax: [phone]

## 2013-10-20 NOTE — Patient Instructions (Signed)
Your physician recommends that you schedule a follow-up appointment in: 3-4 WEEKS WITH DR. Swaziland  LAB WORK TODAY; BMET, CBC W/DIFF, BNP  Your physician recommends that you continue on your current medications as directed. Please refer to the Current Medication list given to you today.  IF YOUR NAUSEA DOES NOT GET ANY BETTER IN THE NEXT WEEK YOU WILL NEED TO CONTACT YOUR PRIMARY CARE PHYSICIAN

## 2013-10-21 ENCOUNTER — Other Ambulatory Visit: Payer: Self-pay | Admitting: *Deleted

## 2013-10-21 DIAGNOSIS — R0600 Dyspnea, unspecified: Secondary | ICD-10-CM

## 2013-10-21 DIAGNOSIS — E876 Hypokalemia: Secondary | ICD-10-CM

## 2013-10-21 DIAGNOSIS — D72829 Elevated white blood cell count, unspecified: Secondary | ICD-10-CM

## 2013-10-22 ENCOUNTER — Telehealth: Payer: Self-pay | Admitting: *Deleted

## 2013-10-22 ENCOUNTER — Other Ambulatory Visit (INDEPENDENT_AMBULATORY_CARE_PROVIDER_SITE_OTHER): Payer: Medicare Other

## 2013-10-22 ENCOUNTER — Ambulatory Visit (INDEPENDENT_AMBULATORY_CARE_PROVIDER_SITE_OTHER)
Admission: RE | Admit: 2013-10-22 | Discharge: 2013-10-22 | Disposition: A | Discharge status: Home / Self Care | Payer: Medicare Other | Source: Ambulatory Visit | Attending: Physician Assistant | Admitting: Physician Assistant

## 2013-10-22 ENCOUNTER — Other Ambulatory Visit: Payer: Medicare Other

## 2013-10-22 ENCOUNTER — Other Ambulatory Visit: Payer: Self-pay | Admitting: *Deleted

## 2013-10-22 DIAGNOSIS — I5033 Acute on chronic diastolic (congestive) heart failure: Secondary | ICD-10-CM

## 2013-10-22 DIAGNOSIS — R0989 Other specified symptoms and signs involving the circulatory and respiratory systems: Secondary | ICD-10-CM

## 2013-10-22 DIAGNOSIS — E876 Hypokalemia: Secondary | ICD-10-CM

## 2013-10-22 DIAGNOSIS — R0600 Dyspnea, unspecified: Secondary | ICD-10-CM

## 2013-10-22 DIAGNOSIS — R0609 Other forms of dyspnea: Secondary | ICD-10-CM

## 2013-10-22 DIAGNOSIS — D72829 Elevated white blood cell count, unspecified: Secondary | ICD-10-CM

## 2013-10-22 LAB — URINALYSIS, ROUTINE W REFLEX MICROSCOPIC
BILIRUBIN URINE: NEGATIVE
HGB URINE DIPSTICK: NEGATIVE
Ketones, ur: NEGATIVE
Leukocytes, UA: NEGATIVE
NITRITE: NEGATIVE
RBC / HPF: NONE SEEN (ref 0–?)
Specific Gravity, Urine: <=1.005 — AB (ref 1.000–1.030)
TOTAL PROTEIN, URINE-UPE24: NEGATIVE
Urine Glucose: NEGATIVE
Urobilinogen, UA: 0.2 (ref 0.0–1.0)
WBC, UA: NONE SEEN (ref 0–?)
pH: 6 (ref 5.0–8.0)

## 2013-10-22 LAB — BASIC METABOLIC PANEL
BUN: 10 mg/dL (ref 6–23)
CO2: 24 mEq/L (ref 19–32)
CREATININE: 1 mg/dL (ref 0.4–1.2)
Calcium: 10.4 mg/dL (ref 8.4–10.5)
Chloride: 106 mEq/L (ref 96–112)
GFR: 57.43 mL/min — AB (ref >60.00)
Glucose, Bld: 108 mg/dL — ABNORMAL HIGH (ref 70–99)
Potassium: 5.1 mEq/L (ref 3.5–5.1)
Sodium: 138 mEq/L (ref 135–145)

## 2013-10-22 MED ORDER — POTASSIUM CHLORIDE CRYS ER 20 MEQ PO TBCR: EXTENDED_RELEASE_TABLET | ORAL | Status: DC

## 2013-10-22 NOTE — Telephone Encounter (Signed)
pt's husband notified about all test results for pt with verbal understanding and aware to decrease K+ 30 meq daily, bmet 9/4.Marland Kitchen

## 2013-11-02 ENCOUNTER — Other Ambulatory Visit (INDEPENDENT_AMBULATORY_CARE_PROVIDER_SITE_OTHER): Payer: Medicare Other

## 2013-11-02 DIAGNOSIS — I5033 Acute on chronic diastolic (congestive) heart failure: Secondary | ICD-10-CM

## 2013-11-02 LAB — BASIC METABOLIC PANEL
BUN: 13 mg/dL (ref 6–23)
CO2: 23 meq/L (ref 19–32)
Calcium: 10.1 mg/dL (ref 8.4–10.5)
Chloride: 107 mEq/L (ref 96–112)
Creatinine, Ser: 1 mg/dL (ref 0.4–1.2)
GFR: 57.42 mL/min — AB (ref >60.00)
Glucose, Bld: 149 mg/dL — ABNORMAL HIGH (ref 70–99)
Potassium: 4.3 mEq/L (ref 3.5–5.1)
SODIUM: 142 meq/L (ref 135–145)

## 2013-11-03 ENCOUNTER — Telehealth: Payer: Self-pay | Admitting: *Deleted

## 2013-11-03 NOTE — Telephone Encounter (Signed)
pt's husband notified about lab results with verbal understanding. Husband advised per Bing Neighbors. PA to have pt f/u w/PCP due to glucose 149. Husband said ok and thank you.

## 2013-11-17 ENCOUNTER — Encounter: Payer: Self-pay | Admitting: Cardiology

## 2013-11-17 ENCOUNTER — Ambulatory Visit (INDEPENDENT_AMBULATORY_CARE_PROVIDER_SITE_OTHER): Payer: Medicare Other | Admitting: Cardiology

## 2013-11-17 VITALS — BP 172/68 | HR 71 | Ht 70.0 in | Wt 216.7 lb

## 2013-11-17 DIAGNOSIS — R609 Edema, unspecified: Secondary | ICD-10-CM

## 2013-11-17 DIAGNOSIS — I5032 Chronic diastolic (congestive) heart failure: Secondary | ICD-10-CM

## 2013-11-17 DIAGNOSIS — E876 Hypokalemia: Secondary | ICD-10-CM

## 2013-11-17 DIAGNOSIS — I509 Heart failure, unspecified: Secondary | ICD-10-CM

## 2013-11-17 NOTE — Patient Instructions (Signed)
Labs -BMP  CONTINUE WITH LASIX (FUROSEMIDE) 40 MG ONE TABLET DAILY IF WEIGHT GOES UP 3 LBS , MAY TAKE 40  MG TWICE A DAY UNTIL HER WEIGHT GOES DOWN  Daily weight  Low sodium diet KEEP APPOINTMENT WITH DR SwazilandJORDAN 11/24/15Low-Sodium Eating Plan Sodium raises blood pressure and causes water to be held in the body. Getting less sodium from food will help lower your blood pressure, reduce any swelling, and protect your heart, liver, and kidneys. We get sodium by adding salt (sodium chloride) to food. Most of our sodium comes from canned, boxed, and frozen foods. Restaurant foods, fast foods, and pizza are also very high in sodium. Even if you take medicine to lower your blood pressure or to reduce fluid in your body, getting less sodium from your food is important. WHAT IS MY PLAN? Most people should limit their sodium intake to 2,300 mg a day. Your health care provider recommends that you limit your sodium intake to __________ a day.  WHAT DO I NEED TO KNOW ABOUT THIS EATING PLAN? For the low-sodium eating plan, you will follow these general guidelines:  Choose foods with a % Daily Value for sodium of less than 5% (as listed on the food label).   Use salt-free seasonings or herbs instead of table salt or sea salt.   Check with your health care provider or pharmacist before using salt substitutes.   Eat fresh foods.  Eat more vegetables and fruits.  Limit canned vegetables. If you do use them, rinse them well to decrease the sodium.   Limit cheese to 1 oz (28 g) per day.   Eat lower-sodium products, often labeled as "lower sodium" or "no salt added."  Avoid foods that contain monosodium glutamate (MSG). MSG is sometimes added to [place] food and some canned foods.  Check food labels (Nutrition Facts labels) on foods to learn how much sodium is in one serving.  Eat more home-cooked food and less restaurant, buffet, and fast food.  When eating at a restaurant, ask that your food  be prepared with less salt or none, if possible.  HOW DO I READ FOOD LABELS FOR SODIUM INFORMATION? The Nutrition Facts label lists the amount of sodium in one serving of the food. If you eat more than one serving, you must multiply the listed amount of sodium by the number of servings. Food labels may also identify foods as:  Sodium free--Less than 5 mg in a serving.  Very low sodium--35 mg or less in a serving.  Low sodium--140 mg or less in a serving.  Light in sodium--50% less sodium in a serving. For example, if a food that usually has 300 mg of sodium is changed to become light in sodium, it will have 150 mg of sodium.  Reduced sodium--25% less sodium in a serving. For example, if a food that usually has 400 mg of sodium is changed to reduced sodium, it will have 300 mg of sodium. WHAT FOODS CAN I EAT? Grains Low-sodium cereals, including oats, puffed wheat and rice, and shredded wheat cereals. Low-sodium crackers. Unsalted rice and pasta. Lower-sodium bread.  Vegetables Frozen or fresh vegetables. Low-sodium or reduced-sodium canned vegetables. Low-sodium or reduced-sodium tomato sauce and paste. Low-sodium or reduced-sodium tomato and vegetable juices.  Fruits Fresh, frozen, and canned fruit. Fruit juice.  Meat and Other Protein Products Low-sodium canned tuna and salmon. Fresh or frozen meat, poultry, seafood, and fish. Lamb. Unsalted nuts. Dried beans, peas, and lentils without added salt. Unsalted canned  beans. Homemade soups without salt. Eggs.  Dairy Milk. Soy milk. Ricotta cheese. Low-sodium or reduced-sodium cheeses. Yogurt.  Condiments Fresh and dried herbs and spices. Salt-free seasonings. Onion and garlic powders. Low-sodium varieties of mustard and ketchup. Lemon juice.  Fats and Oils Reduced-sodium salad dressings. Unsalted butter.  Other Unsalted popcorn and pretzels.  The items listed above may not be a complete list of recommended foods or beverages.  Contact your dietitian for more options. WHAT FOODS ARE NOT RECOMMENDED? Grains Instant hot cereals. Bread stuffing, pancake, and biscuit mixes. Croutons. Seasoned rice or pasta mixes. Noodle soup cups. Boxed or frozen macaroni and cheese. Self-rising flour. Regular salted crackers. Vegetables Regular canned vegetables. Regular canned tomato sauce and paste. Regular tomato and vegetable juices. Frozen vegetables in sauces. Salted french fries. Olives. [name] Fava. Relishes. Sauerkraut. Salsa. Meat and Other Protein Products Salted, canned, smoked, spiced, or pickled meats, seafood, or fish. Bacon, ham, sausage, hot dogs, corned beef, chipped beef, and packaged luncheon meats. Salt pork. Jerky. Pickled herring. Anchovies, regular canned tuna, and sardines. Salted nuts. Dairy Processed cheese and cheese spreads. Cheese curds. Blue cheese and cottage cheese. Buttermilk.  Condiments Onion and garlic salt, seasoned salt, table salt, and sea salt. Canned and packaged gravies. Worcestershire sauce. Tartar sauce. Barbecue sauce. Teriyaki sauce. Soy sauce, including reduced sodium. Steak sauce. Fish sauce. Oyster sauce. Cocktail sauce. Horseradish. Regular ketchup and mustard. Meat flavorings and tenderizers. Bouillon cubes. Hot sauce. Tabasco sauce. Marinades. Taco seasonings. Relishes. Fats and Oils Regular salad dressings. Salted butter. Margarine. Ghee. Bacon fat.  Other Potato and tortilla chips. Corn chips and puffs. Salted popcorn and pretzels. Canned or dried soups. Pizza. Frozen entrees and pot pies.  The items listed above may not be a complete list of foods and beverages to avoid. Contact your dietitian for more information. Document Released: 07/28/2001 Document Revised: 02/10/2013 Document Reviewed: 12/10/2012 [facility] Patient Information 2015 [place], [place]. This information is not intended to replace advice given to you by your health care provider. Make sure you discuss any questions  you have with your health care provider.

## 2013-11-17 NOTE — Progress Notes (Signed)
Patient ID: Debra GravelFrances R Westhoff, female   DOB: [birth year], 80 y.o.   MRN: [id number]    11/17/2013 Debra GravelFrances R Zappulla   [birth year]  [id number]  Primary Physicia Miguel AschoffOSS,ALLAN, MD Primary Cardiologist: Dr. SwazilandJordan  HPI:  Debra Johnston is a 80 y.o. female with a hx of PAF, diastolic CHF, CKD and HL. She is not on Coumadin due to hx of falls. ARB was stopped in the past due to increasing creatinine. She was admitted 8/21-8/23 with a/c diastolic CHF. She was noted to be hypoxic upon presentation with O2 88%. DDimer was negative. CXR was clear. She was diuresed. She was DC on O2. She was evaluated for post hospital f/u by Tereso NewcomerScott Weaver, PA-C, on a 10/20/13. At that time, she noted feeling weak and nauseated since DC. She reported breathing had improved but was still short of breath. She denied chest pain and syncope.  A repeat BNP was rechecked given her continued dyspnea and was normal at 91.0. A BMET was ordered and revealed significant hypokalemia with K at a 2.6. Adjustements were made with her potassium and she underwent a repeat BMET after several days of increased supplementation. Her potassium improved to 4.3 (11/02/13).  She presents back to clinic today for f/u. She is accompanied by her husband. She reports significant improvement in symptoms. She denies weakness and nausea. She denies any increased dyspnea. She remains on 2 L supplemental oxygen has not had to increase her oxygen settings. She denies orthopnea, PND and lower extremity edema. She sleeps with one pillow. No chest pain, palpitations, lightheadedness, dizziness, syncope/near-syncope. She reports full medication compliance. She also weighs herself daily. Her dry weight at home is 206 pounds. She reports adherence to a low-sodium diet.     Current Outpatient Prescriptions  Medication Sig Dispense Refill  . amLODipine (NORVASC) 5 MG tablet Take 1 tablet (5 mg total) by mouth daily.  30 tablet  5  . carvedilol (COREG) 25 MG tablet Take 25 mg by  mouth 2 (two) times daily with a meal.      . Cholecalciferol (VITAMIN D PO) Take 1 tablet by mouth daily.      . CRESTOR 5 MG tablet Take 5 mg by mouth daily.       . Cyanocobalamin (VITAMIN B-12 PO) Take 1 tablet by mouth daily.      . dorzolamide (TRUSOPT) 2 % ophthalmic solution Place 1 drop into both eyes 2 (two) times daily.  10 mL  6  . ferrous sulfate 325 (65 FE) MG tablet Take 325 mg by mouth every other day.        Marland Kitchen. FLUoxetine (PROZAC) 20 MG capsule Take 20 mg by mouth daily at 12 noon.        . furosemide (LASIX) 40 MG tablet Take 1 tablet (40 mg total) by mouth daily.  30 tablet  5  . levothyroxine (SYNTHROID) 88 MCG tablet Take 1 tablet (88 mcg total) by mouth daily before breakfast.      . LORazepam (ATIVAN) 2 MG tablet Take 2 mg by mouth every 8 (eight) hours as needed. anxiety       . Multiple Vitamins-Minerals (ICAPS PO) Take 1 tablet by mouth daily.        . nitroGLYCERIN (NITROSTAT) 0.4 MG SL tablet Place 1 tablet (0.4 mg total) under the tongue every 5 (five) minutes as needed for chest pain.  25 tablet  3  . pantoprazole (PROTONIX) 40 MG tablet Take 40 mg by mouth daily. 1  hour before breakfast       . potassium chloride SA (K-DUR,KLOR-CON) 20 MEQ tablet TAKE 30 MEQ DAILY; (1 AND 1/2 TAB DAILY)       No current facility-administered medications for this visit.    Allergies  Allergen Reactions  . Penicillins Swelling and Rash    History   Social History  . Marital Status: Married    Spouse Name: N/A    Number of Children: N/A  . Years of Education: N/A   Occupational History  . Not on file.   Social History Main Topics  . Smoking status: Never Smoker   . Smokeless tobacco: Never Used  . Alcohol Use: No  . Drug Use: No  . Sexual Activity: Not Currently    Birth Control/ Protection: Post-menopausal   Other Topics Concern  . Not on file   Social History Narrative  . No narrative on file     Review of Systems: General: negative for chills, fever,  night sweats or weight changes.  Cardiovascular: negative for chest pain, dyspnea on exertion, edema, orthopnea, palpitations, paroxysmal nocturnal dyspnea or shortness of breath Dermatological: negative for rash Respiratory: negative for cough or wheezing Urologic: negative for hematuria Abdominal: negative for nausea, vomiting, diarrhea, bright red blood per rectum, melena, or hematemesis Neurologic: negative for visual changes, syncope, or dizziness All other systems reviewed and are otherwise negative except as noted above.    There were no vitals taken for this visit.  General appearance: alert, cooperative and no distress Neck: no carotid bruit and no JVD Lungs: clear to auscultation bilaterally Heart: regular rate and rhythm, S1, S2 normal, no murmur, click, rub or gallop Extremities: no LEE Pulses: 2+ and symmetric Skin: warm and dry Neurologic: Grossly normal  EKG NSR: HR 71 bpm  ASSESSMENT AND PLAN:   1. Chronic diastolic heart failure: She appears euvolemic based on physical exam. No increased oxygen requirement. Continue daily Lasix with supplemental potassium. Daily weights and low-sodium diet. Patient instructed to increase Lasix as needed based on increases in weight. She is to double her Lasix if she gains more than 3 pounds in a 24-hour period.   2. Paroxysmal atrial fibrillation: EKG demonstrates normal sinus rhythm. Heart rate 71 beats per minute. Continue beta blocker. She is not a candidate for anticoagulation secondary to history of falls.  3. Hypokalemia: Repeat BMP on 11/02/2013 revealed improvement of potassium back within normal limits at 4.3. Will repeat a BMET to reassess both electrolytes as well as renal function to see if she would need further adjustments to her potassium as well as Lasix.  PLAN  Repeat BMET to reassess electrolytes and kidney function. Continue management for chronic diastolic heart failure and paroxysmal atrial fibrillation as outlined  above. She already has a followup appointment with Dr. Swaziland scheduled for 01/12/2014. She has been instructed to keep this appointment. She has been instructed to followup sooner if needed.  SIMMONS, BRITTAINYPA-C 11/17/2013 10:37 AM

## 2013-11-18 LAB — BASIC METABOLIC PANEL
BUN: 22 mg/dL (ref 6–23)
CO2: 27 mEq/L (ref 19–32)
CREATININE: 0.82 mg/dL (ref 0.50–1.10)
Calcium: 10.6 mg/dL — ABNORMAL HIGH (ref 8.4–10.5)
Chloride: 102 mEq/L (ref 96–112)
Glucose, Bld: 105 mg/dL — ABNORMAL HIGH (ref 70–99)
Potassium: 4.7 mEq/L (ref 3.5–5.3)
Sodium: 139 mEq/L (ref 135–145)

## 2013-11-20 ENCOUNTER — Telehealth: Payer: Self-pay | Admitting: Cardiology

## 2013-11-20 NOTE — Telephone Encounter (Signed)
Returned a call to patient. Informed her that Dr. SwazilandJordan nor cheryl are in the office today. Once Dr. SwazilandJordan has received her results he will review and give [facility]. She will then call her if changes are needed in her medical therapy. Patient voiced her understanding and will wait on a call from Dr. SwazilandJordan or Elnita Maxwellheryl.

## 2013-11-20 NOTE — Telephone Encounter (Signed)
Mr Debra Johnston was calling to stating that the pt had some labs drawn on 9/29 and would like to know what the results were. Please call  Thanks

## 2014-01-12 ENCOUNTER — Ambulatory Visit: Payer: Medicare Other | Admitting: Cardiology

## 2014-03-31 ENCOUNTER — Ambulatory Visit: Payer: Medicare Other | Admitting: Cardiology

## 2014-06-23 ENCOUNTER — Ambulatory Visit: Payer: Self-pay | Admitting: Cardiology

## 2014-06-29 ENCOUNTER — Ambulatory Visit: Payer: Medicare Other | Admitting: Cardiology

## 2014-07-28 ENCOUNTER — Ambulatory Visit: Payer: Self-pay | Admitting: Cardiology

## 2014-10-06 ENCOUNTER — Ambulatory Visit: Payer: Self-pay | Admitting: Cardiology

## 2014-11-08 ENCOUNTER — Telehealth: Payer: Self-pay | Admitting: Cardiology

## 2014-11-08 DIAGNOSIS — I5033 Acute on chronic diastolic (congestive) heart failure: Secondary | ICD-10-CM

## 2014-11-08 NOTE — Telephone Encounter (Signed)
Returned call to patient's husband.He stated for the past 1 week wife has been more sob.Stated he wanted to know if she can increase O2 to 3L/min.Stated she started taking prednisone for arthritis pain 1 month ago and has gained 10 to 15 lbs.Spoke to [employer] PA she advised to increase furosemide 40 mg twice a day for 3 days.Increase kdur to 1&1/2 tablets twice a day for 3 days,after 3 days return to normal dose.Bmet to be done on Friday 9/23 at [facility] office.Appointment scheduled with Norma Fredrickson NP 11/19/14 at 11:45 am at [facility] office.Advised to call sooner if needed.

## 2014-11-08 NOTE — Telephone Encounter (Signed)
Please call,he wants to know if he can her oxygen up,breathing is getting worse.

## 2014-11-11 ENCOUNTER — Encounter: Payer: Self-pay | Admitting: Cardiovascular Disease

## 2014-11-12 ENCOUNTER — Other Ambulatory Visit (INDEPENDENT_AMBULATORY_CARE_PROVIDER_SITE_OTHER): Payer: Medicare Other | Admitting: *Deleted

## 2014-11-12 DIAGNOSIS — I5033 Acute on chronic diastolic (congestive) heart failure: Secondary | ICD-10-CM | POA: Diagnosis not present

## 2014-11-12 LAB — BASIC METABOLIC PANEL
BUN: 21 mg/dL (ref 6–23)
CHLORIDE: 101 meq/L (ref 96–112)
CO2: 25 meq/L (ref 19–32)
CREATININE: 1.08 mg/dL (ref 0.40–1.20)
Calcium: 10.3 mg/dL (ref 8.4–10.5)
GFR: 51.21 mL/min — ABNORMAL LOW (ref >60.00)
Glucose, Bld: 103 mg/dL — ABNORMAL HIGH (ref 70–99)
Potassium: 4.8 mEq/L (ref 3.5–5.1)
Sodium: 138 mEq/L (ref 135–145)

## 2014-11-19 ENCOUNTER — Encounter: Payer: Self-pay | Admitting: Nurse Practitioner

## 2014-11-19 ENCOUNTER — Ambulatory Visit
Admission: RE | Admit: 2014-11-19 | Discharge: 2014-11-19 | Disposition: A | Discharge status: Home / Self Care | Payer: Medicare Other | Source: Ambulatory Visit | Attending: Nurse Practitioner | Admitting: Nurse Practitioner

## 2014-11-19 ENCOUNTER — Ambulatory Visit (INDEPENDENT_AMBULATORY_CARE_PROVIDER_SITE_OTHER): Payer: Medicare Other | Admitting: Nurse Practitioner

## 2014-11-19 VITALS — BP 132/66 | HR 87 | Ht 70.5 in | Wt 234.0 lb

## 2014-11-19 DIAGNOSIS — R0600 Dyspnea, unspecified: Secondary | ICD-10-CM

## 2014-11-19 DIAGNOSIS — I480 Paroxysmal atrial fibrillation: Secondary | ICD-10-CM

## 2014-11-19 LAB — CBC
HCT: 41.1 % (ref 36.0–46.0)
Hemoglobin: 13.5 g/dL (ref 12.0–15.0)
MCHC: 32.8 g/dL (ref 30.0–36.0)
MCV: 91.9 fl (ref 78.0–100.0)
Platelets: 307 10*3/uL (ref 150.0–400.0)
RBC: 4.47 Mil/uL (ref 3.87–5.11)
RDW: 15.5 % (ref 11.5–15.5)
WBC: 10.8 10*3/uL — ABNORMAL HIGH (ref 4.0–10.5)

## 2014-11-19 LAB — BRAIN NATRIURETIC PEPTIDE: Pro B Natriuretic peptide (BNP): 92 pg/mL (ref 0.0–100.0)

## 2014-11-19 NOTE — Patient Instructions (Addendum)
We will be checking the following labs today - BNP and CBC  Please go to [facility] on the first floor for a chest Xray - you may walk in.    Medication Instructions:    Continue with your current medicines for now. We may need to adjust your medicines but let's see what the labs show first.     Testing/Procedures To Be Arranged:  N/A  Follow-Up:   See Dr. Swaziland as planned in November.   We may need to get you back to see Dr. Maple Hudson    Other Special Instructions:   Stay on your current oxygen level  Call the [facility] office at [phone] if you have any questions, problems or concerns.

## 2014-11-19 NOTE — Progress Notes (Signed)
CARDIOLOGY OFFICE NOTE  Date:  11/19/2014    [name] Date of Birth: Dec 13, 1927 Medical Record #[id number]  PCP:  Donovan Kail, MD  Cardiologist:  [place]    Chief Complaint  Patient presents with  . Shortness of Breath    Work in/follow up visit - seen for Dr. [place]    History of Present Illness: Debra Johnston is a 81 y.o. female who presents today for a work in/follow up visit. Seen for Dr. [place]il[place]. She has a history of GERD, OA, HLD, anxiety, depression, chronic pain syndrome, thyroid abnormality, CKD - no longer on ARB, PAF and not felt to be a candidate for anticoagulation, diastolic HF with EF of 60% and fibromyalgia.   Has not been seen in over a year.   Called earlier this month with "Returned call to patient's husband.He stated for the past 1 week wife has been more sob.Stated he wanted to know if she can increase O2 to 3L/min.Stated she started taking prednisone for arthritis pain 1 month ago and has gained 10 to 15 lbs.Spoke to [employer] PA she advised to increase furosemide 40 mg twice a day for 3 days.Increase kdur to 1&1/2 tablets twice a day for 3 days,after 3 days return to normal dose.Bmet to be done on Friday 9/23 at [facility] office.Appointment scheduled with Norma Fredrickson NP 11/19/14 at 11:45 am at [facility] office.Advised to call sooner if needed".  Comes in today. Here with her husband. I saw her about 3 1/2 years ago. She notes she is more short of breath. Weight is way up. She has been on chronic prednisone. More inactive. Some cough. No chest pain. No palpitations. Gait unsteady but no recent falls. Wanted to increase her oxygen but was told to NOT do that.   Past Medical History  Diagnosis Date  . GERD (gastroesophageal reflux disease)   . Glaucoma   . Osteoarthritis   . Hyperlipidemia   . Anxiety   . Depression   . Bronchitis 02/22/2011  . Chronic pain   . Hypothyroidism   . DOE (dyspnea on exertion)     chronic   . CKD (chronic  kidney disease) stage 4, GFR 15-29 ml/min   . Physical deconditioning   . Falls   . PAF (paroxysmal atrial fibrillation)     not felt to be a candidate for coumadin  . Hypertension   . CHF (congestive heart failure)     felt to have diastolic dysfunction with normal EF at 60%  . Fibromyalgia     with chronic weakness    Past Surgical History  Procedure Laterality Date  . Hemorrhoid surgery    . Laparoscopic hysterectomy    . Knee surgery      bilateral  . Cesarean section    . Transthoracic echocardiogram  02/2011    EF 60% with diastolic dysfunction, high filling pressures  . Appendectomy      as a teenage  . Cholecystectomy      pt denies having cholecystectomy     Medications: Current Outpatient Prescriptions  Medication Sig Dispense Refill  . acetaminophen (TYLENOL) 500 MG tablet Take 500 mg by mouth every 6 (six) hours as needed (back pain).    Marland Kitchen amLODipine (NORVASC) 5 MG tablet Take 1 tablet (5 mg total) by mouth daily. 30 tablet 5  . carvedilol (COREG) 25 MG tablet Take 25 mg by mouth 2 (two) times daily with a meal.    . Cholecalciferol (VITAMIN D  PO) Take 1 tablet by mouth daily.    . CRESTOR 5 MG tablet Take 5 mg by mouth daily.     . dorzolamide (TRUSOPT) 2 % ophthalmic solution Place 1 drop into both eyes 3 (three) times daily.    . ferrous sulfate 325 (65 FE) MG tablet Take 325 mg by mouth every other day.      Marland Kitchen FLUoxetine (PROZAC) 20 MG capsule Take 30 mg by mouth daily at 12 noon.     . fluticasone (FLONASE) 50 MCG/ACT nasal spray Place 2 sprays into both nostrils as needed for allergies or rhinitis.    . furosemide (LASIX) 40 MG tablet Take 1 tablet (40 mg total) by mouth daily. 30 tablet 5  . levothyroxine (SYNTHROID) 88 MCG tablet Take 1 tablet (88 mcg total) by mouth daily before breakfast.    . LORazepam (ATIVAN) 2 MG tablet Take 2 mg by mouth every 8 (eight) hours as needed. anxiety     . magnesium oxide (MAG-OX) 400 MG tablet Take 800 mg by mouth  daily.    . Multiple Vitamins-Minerals (ICAPS PO) Take 1 tablet by mouth daily.      . nitroGLYCERIN (NITROSTAT) 0.4 MG SL tablet Place 1 tablet (0.4 mg total) under the tongue every 5 (five) minutes as needed for chest pain. 25 tablet 3  . NON FORMULARY Place 2-6 L into the nose continuous.    Marland Kitchen OLANZapine (ZYPREXA) 5 MG tablet Take 5 mg by mouth daily.     . pantoprazole (PROTONIX) 40 MG tablet Take 40 mg by mouth daily. 1 hour before breakfast     . potassium chloride SA (K-DUR,KLOR-CON) 20 MEQ tablet TAKE 30 MEQ DAILY; (1 AND 1/2 TAB DAILY)    . predniSONE (STERAPRED UNI-PAK) 10 MG tablet Have 4 more tablets to take    . dorzolamide (TRUSOPT) 2 % ophthalmic solution Place 1 drop into both eyes 2 (two) times daily. (Patient taking differently: Place 1 drop into both eyes 3 (three) times daily. ) 10 mL 6   No current facility-administered medications for this visit.    Allergies: Allergies  Allergen Reactions  . Penicillins Swelling and Rash    Social History: The patient  reports that she has never smoked. She has never used smokeless tobacco. She reports that she does not drink alcohol or use illicit drugs.   Family History: The patient's family history includes Cancer in her brother; Heart attack in her mother; Heart failure in her mother; Lymphoma in her brother; Stroke in her father.   Review of Systems: Please see the history of present illness.   Otherwise, the review of systems is positive for none.   All other systems are reviewed and negative.   Physical Exam: VS:  BP 132/66 mmHg  Pulse 87  Ht 5' 10.5" (1.791 m)  Wt 234 lb (106.142 kg)  BMI 33.09 kg/m2  SpO2 95% .  BMI Body mass index is 33.09 kg/(m^2).  Wt Readings from Last 3 Encounters:  11/19/14 234 lb (106.142 kg)  11/17/13 216 lb 11.2 oz (98.294 kg)  10/20/13 219 lb (99.338 kg)    General: Pleasant. Obese, elderly female who is in no acute distress. Color looks pale. Her weight is up 18 pounds over this  past year.  HEENT: Normal. Neck: Supple, no JVD, carotid bruits, or masses noted.  Cardiac: Regular rate and rhythm. Heart tones distant. No edema.  Respiratory:  Lungs are decreased with scattered faint wheezes bilaterally with normal work of  breathing.  GI: Soft and nontender.  MS: No deformity or atrophy. Gait not tested. She is in a wheelchair. Skin: Warm and dry. Color is pale. Neuro:  Strength and sensation are intact and no gross focal deficits noted.  Psych: Alert, appropriate and with normal affect.   LABORATORY DATA:  EKG:  EKG is ordered today. This demonstrates NSR - some artifact noted.  Lab Results  Component Value Date   WBC 12.6* 10/20/2013   HGB 13.9 10/20/2013   HCT 41.7 10/20/2013   PLT 392.0 10/20/2013   GLUCOSE 103* 11/12/2014   ALT 22 06/19/2011   AST 29 06/19/2011   NA 138 11/12/2014   K 4.8 11/12/2014   CL 101 11/12/2014   CREATININE 1.08 11/12/2014   BUN 21 11/12/2014   CO2 25 11/12/2014   TSH 1.526 06/19/2011   INR 1.8* 06/19/2007   HGBA1C 6.0* 02/22/2011    BNP (last 3 results) No results for input(s): BNP in the last 8760 hours.  ProBNP (last 3 results) No results for input(s): PROBNP in the last 8760 hours.   Other Studies Reviewed Today: Echo Study Conclusions from 09/2013  - Left ventricle: The cavity size was normal. Wall thickness was increased in a pattern of moderate LVH. Systolic function was normal. The estimated ejection fraction was in the range of 55% to 60%. Left ventricular diastolic function parameters were normal. - Mitral valve: Calcified annulus. Mildly thickened leaflets . - Atrial septum: No defect or patent foramen ovale was identified.  Assessment/Plan: 1. Dyspnea - has preserved EF - I suspect this is multifactorial from weight gain, sedentary lifestyle, COPD. Saw Dr. Maple Hudson in the remote past - may need to get back to him. Check CBC and BNP today. Send for CXR today. I do not think her echo needs to be  updated. Hold on med changes until we see the lab. Further disposition to follow.   2. PAF - looks to be in sinus.   3. CKD - recent labs checked.   Current medicines are reviewed with the patient today.  The patient does not have concerns regarding medicines other than what has been noted above.  The following changes have been made:  See above.  Labs/ tests ordered today include:    Orders Placed This Encounter  Procedures  . DG Chest 2 View  . CBC  . Brain natriuretic peptide  . EKG 12-Lead     Disposition:   Further disposition to follow. She has follow up with Dr. [place] in November.   Patient is agreeable to this plan and will call if any problems develop in the interim.   Signed: Rosalio Macadamia, RN, ANP-C 11/19/2014 12:19 PM  [facility] [address] [place], [place]  [address] Phone: [phone] Fax: [phone]

## 2015-01-12 ENCOUNTER — Ambulatory Visit: Payer: Self-pay | Admitting: Cardiology

## 2015-03-16 ENCOUNTER — Emergency Department (HOSPITAL_COMMUNITY)
Admission: EM | Admit: 2015-03-16 | Discharge: 2015-03-16 | Disposition: A | Discharge status: Home / Self Care | Payer: Medicare Other | Attending: Emergency Medicine | Admitting: Emergency Medicine

## 2015-03-16 ENCOUNTER — Emergency Department (EMERGENCY_DEPARTMENT_HOSPITAL)
Admit: 2015-03-16 | Discharge: 2015-03-16 | Disposition: A | Discharge status: Home / Self Care | Payer: Medicare Other | Attending: Emergency Medicine | Admitting: Emergency Medicine

## 2015-03-16 ENCOUNTER — Emergency Department (HOSPITAL_COMMUNITY): Payer: Medicare Other

## 2015-03-16 ENCOUNTER — Encounter (HOSPITAL_COMMUNITY): Payer: Self-pay

## 2015-03-16 DIAGNOSIS — I129 Hypertensive chronic kidney disease with stage 1 through stage 4 chronic kidney disease, or unspecified chronic kidney disease: Secondary | ICD-10-CM | POA: Diagnosis not present

## 2015-03-16 DIAGNOSIS — I110 Hypertensive heart disease with heart failure: Secondary | ICD-10-CM | POA: Diagnosis present

## 2015-03-16 DIAGNOSIS — F329 Major depressive disorder, single episode, unspecified: Secondary | ICD-10-CM | POA: Diagnosis not present

## 2015-03-16 DIAGNOSIS — E785 Hyperlipidemia, unspecified: Secondary | ICD-10-CM | POA: Insufficient documentation

## 2015-03-16 DIAGNOSIS — I509 Heart failure, unspecified: Secondary | ICD-10-CM | POA: Insufficient documentation

## 2015-03-16 DIAGNOSIS — E039 Hypothyroidism, unspecified: Secondary | ICD-10-CM | POA: Diagnosis not present

## 2015-03-16 DIAGNOSIS — Z79899 Other long term (current) drug therapy: Secondary | ICD-10-CM | POA: Diagnosis not present

## 2015-03-16 DIAGNOSIS — H409 Unspecified glaucoma: Secondary | ICD-10-CM | POA: Diagnosis not present

## 2015-03-16 DIAGNOSIS — M79604 Pain in right leg: Secondary | ICD-10-CM | POA: Diagnosis present

## 2015-03-16 DIAGNOSIS — N184 Chronic kidney disease, stage 4 (severe): Secondary | ICD-10-CM | POA: Diagnosis not present

## 2015-03-16 DIAGNOSIS — R52 Pain, unspecified: Secondary | ICD-10-CM

## 2015-03-16 DIAGNOSIS — I480 Paroxysmal atrial fibrillation: Secondary | ICD-10-CM | POA: Diagnosis present

## 2015-03-16 DIAGNOSIS — M1990 Unspecified osteoarthritis, unspecified site: Secondary | ICD-10-CM | POA: Insufficient documentation

## 2015-03-16 DIAGNOSIS — M797 Fibromyalgia: Secondary | ICD-10-CM | POA: Diagnosis present

## 2015-03-16 DIAGNOSIS — I5032 Chronic diastolic (congestive) heart failure: Secondary | ICD-10-CM | POA: Diagnosis present

## 2015-03-16 DIAGNOSIS — R262 Difficulty in walking, not elsewhere classified: Secondary | ICD-10-CM | POA: Diagnosis present

## 2015-03-16 DIAGNOSIS — F419 Anxiety disorder, unspecified: Secondary | ICD-10-CM | POA: Diagnosis not present

## 2015-03-16 DIAGNOSIS — R531 Weakness: Secondary | ICD-10-CM | POA: Diagnosis not present

## 2015-03-16 DIAGNOSIS — M79609 Pain in unspecified limb: Secondary | ICD-10-CM

## 2015-03-16 DIAGNOSIS — K219 Gastro-esophageal reflux disease without esophagitis: Secondary | ICD-10-CM | POA: Diagnosis not present

## 2015-03-16 DIAGNOSIS — Z7952 Long term (current) use of systemic steroids: Secondary | ICD-10-CM | POA: Insufficient documentation

## 2015-03-16 DIAGNOSIS — G8929 Other chronic pain: Secondary | ICD-10-CM | POA: Insufficient documentation

## 2015-03-16 DIAGNOSIS — Z880 Allergy status to penicillin: Secondary | ICD-10-CM | POA: Insufficient documentation

## 2015-03-16 LAB — CBC WITH DIFFERENTIAL/PLATELET
BASOS ABS: 0.1 10*3/uL (ref 0.0–0.1)
BASOS PCT: 1 %
EOS ABS: 0.4 10*3/uL (ref 0.0–0.7)
Eosinophils Relative: 4 %
HCT: 39.2 % (ref 36.0–46.0)
Hemoglobin: 12.3 g/dL (ref 12.0–15.0)
Lymphocytes Relative: 23 %
Lymphs Abs: 2.3 10*3/uL (ref 0.7–4.0)
MCH: 30.2 pg (ref 26.0–34.0)
MCHC: 31.4 g/dL (ref 30.0–36.0)
MCV: 96.3 fL (ref 78.0–100.0)
MONO ABS: 0.9 10*3/uL (ref 0.1–1.0)
MONOS PCT: 9 %
NEUTROS ABS: 6.3 10*3/uL (ref 1.7–7.7)
Neutrophils Relative %: 63 %
PLATELETS: 282 10*3/uL (ref 150–400)
RBC: 4.07 MIL/uL (ref 3.87–5.11)
RDW: 14.7 % (ref 11.5–15.5)
WBC: 10 10*3/uL (ref 4.0–10.5)

## 2015-03-16 LAB — COMPREHENSIVE METABOLIC PANEL
ALBUMIN: 3.5 g/dL (ref 3.5–5.0)
ALT: 22 U/L (ref 14–54)
ANION GAP: 12 (ref 5–15)
AST: 23 U/L (ref 15–41)
Alkaline Phosphatase: 58 U/L (ref 38–126)
BUN: 12 mg/dL (ref 6–20)
CHLORIDE: 106 mmol/L (ref 101–111)
CO2: 25 mmol/L (ref 22–32)
Calcium: 10 mg/dL (ref 8.9–10.3)
Creatinine, Ser: 0.92 mg/dL (ref 0.44–1.00)
GFR calc Af Amer: >60 mL/min (ref >60)
GFR calc non Af Amer: 55 mL/min — ABNORMAL LOW (ref >60)
GLUCOSE: 103 mg/dL — AB (ref 65–99)
POTASSIUM: 4.3 mmol/L (ref 3.5–5.1)
SODIUM: 143 mmol/L (ref 135–145)
TOTAL PROTEIN: 6 g/dL — AB (ref 6.5–8.1)
Total Bilirubin: 0.7 mg/dL (ref 0.3–1.2)

## 2015-03-16 LAB — I-STAT TROPONIN, ED: Troponin i, poc: 0.01 ng/mL (ref 0.00–0.08)

## 2015-03-16 LAB — BRAIN NATRIURETIC PEPTIDE: B Natriuretic Peptide: 149.3 pg/mL — ABNORMAL HIGH (ref 0.0–100.0)

## 2015-03-16 MED ORDER — TRAMADOL HCL 50 MG PO TABS
50.0000 mg | ORAL_TABLET | Freq: Once | ORAL | Status: AC
  Administered 2015-03-16: 50 mg via ORAL
  Filled 2015-03-16: qty 1

## 2015-03-16 MED ORDER — TRAMADOL HCL 50 MG PO TABS: 50.0000 mg | ORAL_TABLET | Freq: Four times a day (QID) | ORAL | Status: DC | PRN

## 2015-03-16 NOTE — ED Provider Notes (Signed)
CSN: [id number]     Arrival date & time 03/16/15  1158 History   First MD Initiated Contact with Patient 03/16/15 1210     Chief Complaint  Patient presents with  . Difficulty Walking  . Weakness      Patient is a 82 y.o. female presenting with weakness. The history is provided by the patient.  Weakness This is a new problem. Pertinent negatives include no abdominal pain.   patient presents with generalized weakness and pain in her right thigh. States she's had for a while now. The pain is increased now she states she cannot walk because of it. No fevers or chills. She has a history chronic shortness of breath she states is no different. No fevers or chills. No swelling or legs. No trauma. The pain is dull and constant. She states she is not taking anything for the pain.  Past Medical History  Diagnosis Date  . GERD (gastroesophageal reflux disease)   . Glaucoma   . Osteoarthritis   . Hyperlipidemia   . Anxiety   . Depression   . Bronchitis 02/22/2011  . Chronic pain   . Hypothyroidism   . DOE (dyspnea on exertion)     chronic   . CKD (chronic kidney disease) stage 4, GFR 15-29 ml/min (HCC)   . Physical deconditioning   . Falls   . PAF (paroxysmal atrial fibrillation) (HCC)     not felt to be a candidate for coumadin  . Hypertension   . CHF (congestive heart failure) (HCC)     felt to have diastolic dysfunction with normal EF at 60%  . Fibromyalgia     with chronic weakness   Past Surgical History  Procedure Laterality Date  . Hemorrhoid surgery    . Laparoscopic hysterectomy    . Knee surgery      bilateral  . Cesarean section    . Transthoracic echocardiogram  02/2011    EF 60% with diastolic dysfunction, high filling pressures  . Appendectomy      as a teenage  . Cholecystectomy      pt denies having cholecystectomy   Family History  Problem Relation Age of Onset  . Heart failure Mother   . Stroke Father   . Lymphoma Brother   . Cancer Brother   . Heart  attack Mother    Social History  Substance Use Topics  . Smoking status: Never Smoker   . Smokeless tobacco: Never Used  . Alcohol Use: No   OB History    No data available     Review of Systems  Constitutional: Positive for fatigue. Negative for appetite change.  Respiratory: Negative for chest tightness.   Cardiovascular: Negative for leg swelling.  Gastrointestinal: Negative for abdominal pain.  Genitourinary: Negative for dysuria.  Musculoskeletal: Positive for myalgias and back pain. Negative for joint swelling.  Skin: Negative for color change.  Neurological: Positive for weakness.      Allergies  Penicillins  Home Medications   Prior to Admission medications   Medication Sig Start Date End Date Taking? Authorizing Provider  acetaminophen (TYLENOL) 500 MG tablet Take 500 mg by mouth every 6 (six) hours as needed (back pain).   Yes Historical Provider, MD  amLODipine (NORVASC) 5 MG tablet Take 1 tablet (5 mg total) by mouth daily. 10/11/13  Yes Dwana Melena, PA-C  carvedilol (COREG) 25 MG tablet Take 25 mg by mouth 2 (two) times daily with a meal. 09/26/12  Yes Peter M [place], MD  Cholecalciferol (VITAMIN D PO) Take 1 tablet by mouth daily.   Yes Historical Provider, MD  CRESTOR 5 MG tablet Take 5 mg by mouth daily.  12/25/12  Yes Historical Provider, MD  dorzolamide (TRUSOPT) 2 % ophthalmic solution Place 1 drop into both eyes 3 (three) times daily.   Yes Historical Provider, MD  ferrous sulfate 325 (65 FE) MG tablet Take 325 mg by mouth every other day.     Yes Historical Provider, MD  FLUoxetine (PROZAC) 20 MG capsule Take 30 mg by mouth daily at 12 noon.    Yes Historical Provider, MD  fluticasone (FLONASE) 50 MCG/ACT nasal spray Place 2 sprays into both nostrils as needed for allergies or rhinitis.   Yes Historical Provider, MD  furosemide (LASIX) 40 MG tablet Take 1 tablet (40 mg total) by mouth daily. 10/11/13  Yes Dwana Melena, PA-C  levothyroxine (SYNTHROID) 88  MCG tablet Take 1 tablet (88 mcg total) by mouth daily before breakfast. 01/02/13  Yes Peter M [place], MD  LORazepam (ATIVAN) 2 MG tablet Take 2 mg by mouth every 8 (eight) hours as needed. anxiety    Yes Historical Provider, MD  magnesium oxide (MAG-OX) 400 MG tablet Take 800 mg by mouth daily.   Yes Historical Provider, MD  Multiple Vitamins-Minerals (ICAPS PO) Take 1 tablet by mouth daily.     Yes Historical Provider, MD  NON FORMULARY Place 2-6 L into the nose continuous.   Yes Historical Provider, MD  OLANZapine (ZYPREXA) 5 MG tablet Take 5 mg by mouth daily.  11/03/13  Yes Historical Provider, MD  pantoprazole (PROTONIX) 40 MG tablet Take 40 mg by mouth daily. 1 hour before breakfast    Yes Historical Provider, MD  potassium chloride SA (K-DUR,KLOR-CON) 20 MEQ tablet TAKE 30 MEQ DAILY; (1 AND 1/2 TAB DAILY) 10/22/13  Yes Scott T Alben Spittle, PA-C  dorzolamide (TRUSOPT) 2 % ophthalmic solution Place 1 drop into both eyes 2 (two) times daily. Patient taking differently: Place 1 drop into both eyes 3 (three) times daily.  03/04/11 11/17/13  Jodelle Gross, NP  nitroGLYCERIN (NITROSTAT) 0.4 MG SL tablet Place 1 tablet (0.4 mg total) under the tongue every 5 (five) minutes as needed for chest pain. 01/29/13 11/30/14  Peter M [place], MD  predniSONE (STERAPRED UNI-PAK) 10 MG tablet Have 4 more tablets to take 11/13/13   Historical Provider, MD  traMADol (ULTRAM) 50 MG tablet Take 1 tablet (50 mg total) by mouth every 6 (six) hours as needed. 03/16/15   Benjiman Core, MD   BP 150/60 mmHg  Pulse 74  Temp(Src) 98.3 F (36.8 C) (Oral)  Resp 18  Ht  (1.778 m)  Wt 260 lb 12.9 oz (118.3 kg)  BMI 37.42 kg/m2  SpO2 97% Physical Exam  Constitutional: She appears well-developed.  HENT:  Head: Atraumatic.  Neck: Neck supple.  Cardiovascular: Normal rate.   Pulmonary/Chest: Effort normal. No respiratory distress.  Abdominal: Soft. There is no tenderness.  Musculoskeletal:  Good range of motion and  right lower extremity. No tenderness over the knee. No swelling. Mild tenderness proximal to the right knee.  Neurological: She is alert.  Skin: Skin is warm.    ED Course  Procedures (including critical care time) Labs Review Labs Reviewed  COMPREHENSIVE METABOLIC PANEL - Abnormal; Notable for the following:    Glucose, Bld 103 (*)    Total Protein 6.0 (*)    GFR calc non Af Amer 55 (*)    All other components  within normal limits  BRAIN NATRIURETIC PEPTIDE - Abnormal; Notable for the following:    B Natriuretic Peptide 149.3 (*)    All other components within normal limits  CBC WITH DIFFERENTIAL/PLATELET  Rosezena Sensor, ED    Imaging Review Dg Chest 1 View  03/16/2015  CLINICAL DATA:  Chest pain EXAM: CHEST 1 VIEW COMPARISON:  11/19/2014 FINDINGS: There is elevation of the right diaphragm. There is no focal parenchymal opacity. There is no pleural effusion or pneumothorax. The heart and mediastinal contours are unremarkable. The osseous structures are unremarkable. IMPRESSION: No active disease. Electronically Signed   By: Elige Ko   On: 03/16/2015 13:24   Dg Femur, Min 2 Views Right  03/16/2015  CLINICAL DATA:  No known injury.  Pain above the knee. EXAM: RIGHT FEMUR 2 VIEWS COMPARISON:  None. FINDINGS: There is no evidence of fracture or other focal bone lesions. There is mild osteoarthritis of the right hip. There is a right total knee arthroplasty without failure or complication. Soft tissues are unremarkable. IMPRESSION: No acute osseous injury of the right femur. Electronically Signed   By: Elige Ko   On: 03/16/2015 13:25   I have personally reviewed and evaluated these images and lab results as part of my medical decision-making.   EKG Interpretation   Date/Time:  Wednesday March 16 2015 12:07:11 EST Ventricular Rate:  90 PR Interval:  180 QRS Duration: 98 QT Interval:  365 QTC Calculation: 447 R Axis:   -20 Text Interpretation:  Sinus rhythm Borderline  left axis deviation  Confirmed by Rubin Payor  MD, Harrold Donath [phone]) on 03/16/2015 2:42:05 PM      MDM   Final diagnoses:  Right leg pain    Patient with right leg pain. Overall exam reassuring. Will get pain medicine. Doppler pending. Labs reassuring. Will discharge to follow-up with PCP as needed.    Benjiman Core, MD 03/16/15 [phone]

## 2015-03-16 NOTE — ED Notes (Signed)
[facility] has been called  

## 2015-03-16 NOTE — Care Management Note (Signed)
Case Management Note  Patient Details  Name: Debra Johnston MRN: [id number] Date of Birth: 02-Mar-1927  Subjective/Objective:        Patient presented to [facility] ED with complaints of leg pain and difficulty walking, Doppler neg for DVT.            Action/Plan: CM consulted regarding assistance with safe disposition. CM reveiwed patient's record patient not meeting criteria for an inpatient admission. CM met with patient and family at bedside patient has been having difficulty ambulating. Discussed with patient goals of care, patient's goal is rehab placement.  Explained that placement is not possible from the ED,discussed recommendations for [facility] services with a SW so placement can be worked on from home. Patient and family are amendable with plan . Offered choice [facility] chosen, Referral was faxed in to [phone] ED CM consulted with ED CSW who is agreeable will assist with initiating the FL2 and PASAAR. Updated Dr. Wilson Singer on discharge plan he is agreeable with plan. No further ED CM needs identified.  Expected Discharge Date:                 . Expected Discharge Plan:  [place]  In-House Referral:     Discharge planning Services  CM Consult  Post Acute Care Choice:   03/16/15 Choice offered to:  Patient, Spouse, Adult Children  DME Arranged:    DME Agency:     HH Arranged:  RN, PT, OT, Social Work [employer] Agency:  [place]  Status of Service:   completed signed off  Medicare Important Message Given:    Date Medicare IM Given:    Medicare IM give by:    Date Additional Medicare IM Given:    Additional Medicare Important Message give by:     If discussed at [place] of Stay Meetings, dates discussed:    Additional CommentsLaurena Slimmer, RN 03/16/2015, 7:17 PM

## 2015-03-16 NOTE — Progress Notes (Signed)
*  Preliminary Results* Right lower extremity venous duplex completed. Right lower extremity is negative for deep vein thrombosis. There is no evidence of right Baker's cyst.  03/16/2015 4:16 PM  Gertie Fey, RVT, RDCS, RDMS

## 2015-03-16 NOTE — Discharge Instructions (Signed)
Pain Without a Known Cause °WHAT IS PAIN WITHOUT A KNOWN CAUSE? °Pain can occur in any part of the body and can range from mild to severe. Sometimes no cause can be found for why you are having pain. Some types of pain that can occur without a known cause include:  °· Headache. °· Back pain. °· Abdominal pain. °· Neck pain. °HOW IS PAIN WITHOUT A KNOWN CAUSE DIAGNOSED?  °Your health care provider will try to find the cause of your pain. This may include: °· Physical exam. °· Medical history. °· Blood tests. °· Urine tests. °· X-rays. °If no cause is found, your health care provider may diagnose you with pain without a known cause.  °IS THERE TREATMENT FOR PAIN WITHOUT A CAUSE?  °Treatment depends on the kind of pain you have. Your health care provider may prescribe medicines to help relieve your pain.  °WHAT CAN I DO AT HOME FOR MY PAIN?  °· Take medicines only as directed by your health care provider. °· Stop any activities that cause pain. During periods of severe pain, bed rest may help. °· Try to reduce your stress with activities such as yoga or meditation. Talk to your health care provider for other stress-reducing activity recommendations. °· Exercise regularly, if approved by your health care provider. °· Eat a healthy diet that includes fruits and vegetables. This may improve pain. Talk to your health care provider if you have any questions about your diet. °WHAT IF MY PAIN DOES NOT GET BETTER?  °If you have a painful condition and no reason can be found for the pain or the pain gets worse, it is important to follow up with your health care provider. It may be necessary to repeat tests and look further for a possible cause.  °  °This information is not intended to replace advice given to you by your health care provider. Make sure you discuss any questions you have with your health care provider. °  °Document Released: 10/31/2000 Document Revised: 02/26/2014 Document Reviewed: 06/23/2013 °Elsevier  Interactive Patient Education ©2016 [employer]. ° °

## 2015-03-16 NOTE — ED Notes (Signed)
Pt reports gradual onset of generalized weakness "for several weeks"  Pt reports the weakness has gotten worse over the past two weeks where she can no longer ambulate with her walker.  Pt reports a lot of it has to do with increased pain to her right upper leg.  Pt denies any injury.

## 2015-03-16 NOTE — [place] FL2 (Signed)
[place] MEDICAID FL2 LEVEL OF CARE SCREENING TOOL     IDENTIFICATION  Patient Name: Debra Johnston Birthdate: 05/30/1927 Sex: female Admission Date (Current Location): 03/16/2015  [facility] and [place] Number:  Producer, television/film/video and Address:  [facility] [place], [address] [place], [place] [address]      Provider Number: [id number]  Attending Physician Name and Address:  No att. providers found  Relative Name and Phone Number:       Current Level of Care: Home Recommended Level of Care: Skilled Nursing Facility Prior Approval Number:    Date Approved/Denied:   PASRR Number:    Discharge Plan: SNF    Current Diagnoses: Patient Active Problem List   Diagnosis Date Noted  . Fibromyalgia affecting lower leg 03/16/2015  . Leg pain, right 03/16/2015  . (HFpEF) heart failure with preserved ejection fraction (HCC) 10/09/2013  . HTN (hypertension) 07/01/2012  . PAF (paroxysmal atrial fibrillation) (HCC) 06/19/2011  . Hypothyroidism 06/19/2011  . Chronic diastolic CHF (congestive heart failure) (HCC) 03/12/2011  . Renal insufficiency 02/25/2011  . Acute bronchitis 02/22/2011  . Acute on chronic diastolic heart failure (HCC) 02/22/2011  . EDEMA LEG 01/26/2010    Orientation RESPIRATION BLADDER Height & Weight    Self, Time, Situation, Place  Normal Continent  (177.8 cm) 260 lbs.  BEHAVIORAL SYMPTOMS/MOOD NEUROLOGICAL BOWEL NUTRITION STATUS      Continent Diet (heart healthy)  AMBULATORY STATUS COMMUNICATION OF NEEDS Skin   Extensive Assist Verbally Normal                       Personal Care Assistance Level of Assistance  Bathing, Dressing Bathing Assistance: Limited assistance   Dressing Assistance: Limited assistance     Functional Limitations Info             SPECIAL CARE FACTORS FREQUENCY  PT (By licensed PT), OT (By licensed OT)                    Contractures Contractures Info: Not present     Additional Factors Info  Psychotropic               Current Medications (03/16/2015):  This is the current hospital active medication list No current facility-administered medications for this encounter.   Current Outpatient Prescriptions  Medication Sig Dispense Refill  . acetaminophen (TYLENOL) 500 MG tablet Take 500 mg by mouth every 6 (six) hours as needed (back pain).    Marland Kitchen amLODipine (NORVASC) 5 MG tablet Take 1 tablet (5 mg total) by mouth daily. 30 tablet 5  . carvedilol (COREG) 25 MG tablet Take 25 mg by mouth 2 (two) times daily with a meal.    . Cholecalciferol (VITAMIN D PO) Take 1 tablet by mouth daily.    . CRESTOR 5 MG tablet Take 5 mg by mouth daily.     . dorzolamide (TRUSOPT) 2 % ophthalmic solution Place 1 drop into both eyes 3 (three) times daily.    . ferrous sulfate 325 (65 FE) MG tablet Take 325 mg by mouth every other day.      Marland Kitchen FLUoxetine (PROZAC) 20 MG capsule Take 30 mg by mouth daily at 12 noon.     . fluticasone (FLONASE) 50 MCG/ACT nasal spray Place 2 sprays into both nostrils as needed for allergies or rhinitis.    . furosemide (LASIX) 40 MG tablet Take 1 tablet (40 mg total) by mouth daily. 30 tablet  5  . levothyroxine (SYNTHROID) 88 MCG tablet Take 1 tablet (88 mcg total) by mouth daily before breakfast.    . LORazepam (ATIVAN) 2 MG tablet Take 2 mg by mouth every 8 (eight) hours as needed. anxiety     . magnesium oxide (MAG-OX) 400 MG tablet Take 800 mg by mouth daily.    . Multiple Vitamins-Minerals (ICAPS PO) Take 1 tablet by mouth daily.      . NON FORMULARY Place 2-6 L into the nose continuous.    Marland Kitchen OLANZapine (ZYPREXA) 5 MG tablet Take 5 mg by mouth daily.     . pantoprazole (PROTONIX) 40 MG tablet Take 40 mg by mouth daily. 1 hour before breakfast     . potassium chloride SA (K-DUR,KLOR-CON) 20 MEQ tablet TAKE 30 MEQ DAILY; (1 AND 1/2 TAB DAILY)    . dorzolamide (TRUSOPT) 2 % ophthalmic solution Place 1 drop into both eyes 2 (two) times  daily. (Patient taking differently: Place 1 drop into both eyes 3 (three) times daily. ) 10 mL 6  . nitroGLYCERIN (NITROSTAT) 0.4 MG SL tablet Place 1 tablet (0.4 mg total) under the tongue every 5 (five) minutes as needed for chest pain. 25 tablet 3  . predniSONE (STERAPRED UNI-PAK) 10 MG tablet Have 4 more tablets to take    . traMADol (ULTRAM) 50 MG tablet Take 1 tablet (50 mg total) by mouth every 6 (six) hours as needed. 15 tablet 0     Discharge Medications: Please see discharge summary for a list of discharge medications.  Relevant Imaging Results:  Relevant Lab Results:   Additional Information    Cyenna Rebello M, LCSW

## 2015-03-16 NOTE — ED Provider Notes (Signed)
Doppler negative for DVT. Family expressing several concerns with regards to ability to adequately care for patient at home. We'll see if echo social work speak with husband with regards to possible placement.  Raeford Razor, MD 03/20/15 [phone]

## 2015-03-18 ENCOUNTER — Telehealth: Payer: Self-pay | Admitting: *Deleted

## 2015-03-18 NOTE — Telephone Encounter (Signed)
ERCM received message from CMA Verlee Monte that pt husband concerned that [facility] has not come to [facility] visit.  ERCM contacted [facility] to find that RN visit scheduled for today.  ERCM returned call to pt husband to find that [facility] was in home at time of call.

## 2015-03-21 ENCOUNTER — Emergency Department (HOSPITAL_COMMUNITY): Payer: Medicare Other

## 2015-03-21 ENCOUNTER — Encounter (HOSPITAL_COMMUNITY): Payer: Self-pay | Admitting: Emergency Medicine

## 2015-03-21 ENCOUNTER — Inpatient Hospital Stay (HOSPITAL_COMMUNITY)
Admission: EM | Admit: 2015-03-21 | Discharge: 2015-04-01 | DRG: 480 | Disposition: A | Discharge status: Skilled Nursing Facility | Payer: Medicare Other | Attending: Internal Medicine | Admitting: Internal Medicine

## 2015-03-21 DIAGNOSIS — K219 Gastro-esophageal reflux disease without esophagitis: Secondary | ICD-10-CM | POA: Diagnosis present

## 2015-03-21 DIAGNOSIS — Z419 Encounter for procedure for purposes other than remedying health state, unspecified: Secondary | ICD-10-CM

## 2015-03-21 DIAGNOSIS — M79604 Pain in right leg: Secondary | ICD-10-CM

## 2015-03-21 DIAGNOSIS — D72829 Elevated white blood cell count, unspecified: Secondary | ICD-10-CM | POA: Diagnosis present

## 2015-03-21 DIAGNOSIS — I480 Paroxysmal atrial fibrillation: Secondary | ICD-10-CM | POA: Diagnosis present

## 2015-03-21 DIAGNOSIS — E785 Hyperlipidemia, unspecified: Secondary | ICD-10-CM | POA: Diagnosis present

## 2015-03-21 DIAGNOSIS — W19XXXA Unspecified fall, initial encounter: Secondary | ICD-10-CM | POA: Diagnosis present

## 2015-03-21 DIAGNOSIS — I130 Hypertensive heart and chronic kidney disease with heart failure and stage 1 through stage 4 chronic kidney disease, or unspecified chronic kidney disease: Secondary | ICD-10-CM | POA: Diagnosis present

## 2015-03-21 DIAGNOSIS — M797 Fibromyalgia: Secondary | ICD-10-CM | POA: Diagnosis present

## 2015-03-21 DIAGNOSIS — J189 Pneumonia, unspecified organism: Secondary | ICD-10-CM | POA: Diagnosis present

## 2015-03-21 DIAGNOSIS — I110 Hypertensive heart disease with heart failure: Secondary | ICD-10-CM | POA: Diagnosis present

## 2015-03-21 DIAGNOSIS — Y92000 Kitchen of unspecified non-institutional (private) residence as  the place of occurrence of the external cause: Secondary | ICD-10-CM

## 2015-03-21 DIAGNOSIS — Z01810 Encounter for preprocedural cardiovascular examination: Secondary | ICD-10-CM | POA: Diagnosis present

## 2015-03-21 DIAGNOSIS — Z79899 Other long term (current) drug therapy: Secondary | ICD-10-CM

## 2015-03-21 DIAGNOSIS — R32 Unspecified urinary incontinence: Secondary | ICD-10-CM | POA: Diagnosis present

## 2015-03-21 DIAGNOSIS — I5033 Acute on chronic diastolic (congestive) heart failure: Secondary | ICD-10-CM | POA: Diagnosis present

## 2015-03-21 DIAGNOSIS — K5900 Constipation, unspecified: Secondary | ICD-10-CM | POA: Diagnosis present

## 2015-03-21 DIAGNOSIS — R0902 Hypoxemia: Secondary | ICD-10-CM

## 2015-03-21 DIAGNOSIS — R0602 Shortness of breath: Secondary | ICD-10-CM

## 2015-03-21 DIAGNOSIS — H409 Unspecified glaucoma: Secondary | ICD-10-CM | POA: Diagnosis present

## 2015-03-21 DIAGNOSIS — S7221XA Displaced subtrochanteric fracture of right femur, initial encounter for closed fracture: Secondary | ICD-10-CM | POA: Diagnosis not present

## 2015-03-21 DIAGNOSIS — E039 Hypothyroidism, unspecified: Secondary | ICD-10-CM | POA: Diagnosis present

## 2015-03-21 DIAGNOSIS — A419 Sepsis, unspecified organism: Secondary | ICD-10-CM

## 2015-03-21 DIAGNOSIS — N183 Chronic kidney disease, stage 3 unspecified: Secondary | ICD-10-CM | POA: Diagnosis present

## 2015-03-21 DIAGNOSIS — M25511 Pain in right shoulder: Secondary | ICD-10-CM | POA: Diagnosis present

## 2015-03-21 DIAGNOSIS — Z96651 Presence of right artificial knee joint: Secondary | ICD-10-CM | POA: Diagnosis present

## 2015-03-21 DIAGNOSIS — Z8249 Family history of ischemic heart disease and other diseases of the circulatory system: Secondary | ICD-10-CM

## 2015-03-21 DIAGNOSIS — N179 Acute kidney failure, unspecified: Secondary | ICD-10-CM | POA: Diagnosis present

## 2015-03-21 DIAGNOSIS — Z9981 Dependence on supplemental oxygen: Secondary | ICD-10-CM

## 2015-03-21 DIAGNOSIS — M79606 Pain in leg, unspecified: Secondary | ICD-10-CM

## 2015-03-21 LAB — URINALYSIS, ROUTINE W REFLEX MICROSCOPIC
Bilirubin Urine: NEGATIVE
GLUCOSE, UA: NEGATIVE mg/dL
HGB URINE DIPSTICK: NEGATIVE
Ketones, ur: NEGATIVE mg/dL
Leukocytes, UA: NEGATIVE
Nitrite: NEGATIVE
Protein, ur: NEGATIVE mg/dL
SPECIFIC GRAVITY, URINE: 1.011 (ref 1.005–1.030)
pH: 6 (ref 5.0–8.0)

## 2015-03-21 LAB — CBC WITH DIFFERENTIAL/PLATELET
BASOS ABS: 0 10*3/uL (ref 0.0–0.1)
Basophils Relative: 1 %
Eosinophils Absolute: 0.3 10*3/uL (ref 0.0–0.7)
Eosinophils Relative: 4 %
HEMATOCRIT: 38.6 % (ref 36.0–46.0)
HEMOGLOBIN: 12 g/dL (ref 12.0–15.0)
LYMPHS ABS: 1.7 10*3/uL (ref 0.7–4.0)
LYMPHS PCT: 21 %
MCH: 30 pg (ref 26.0–34.0)
MCHC: 31.1 g/dL (ref 30.0–36.0)
MCV: 96.5 fL (ref 78.0–100.0)
Monocytes Absolute: 1.4 10*3/uL — ABNORMAL HIGH (ref 0.1–1.0)
Monocytes Relative: 17 %
NEUTROS ABS: 4.4 10*3/uL (ref 1.7–7.7)
NEUTROS PCT: 57 %
Platelets: 291 10*3/uL (ref 150–400)
RBC: 4 MIL/uL (ref 3.87–5.11)
RDW: 14.8 % (ref 11.5–15.5)
WBC: 7.8 10*3/uL (ref 4.0–10.5)

## 2015-03-21 MED ORDER — FENTANYL CITRATE (PF) 100 MCG/2ML IJ SOLN
50.0000 ug | INTRAMUSCULAR | Status: DC | PRN
  Administered 2015-03-21: 50 ug via INTRAVENOUS
  Filled 2015-03-21: qty 2

## 2015-03-21 MED ORDER — FENTANYL CITRATE (PF) 100 MCG/2ML IJ SOLN
50.0000 ug | Freq: Once | INTRAMUSCULAR | Status: AC
  Administered 2015-03-21: 50 ug via INTRAVENOUS
  Filled 2015-03-21: qty 2

## 2015-03-21 NOTE — ED Notes (Signed)
Pt arrives via [facility] from home, states husband was attempting to help her get onto the toilet when her R knee "just popped." Pt's R leg is externally rotated and shortened, bruising beginning to show just below R knee. CMS intact, patient states 10/10 pain.

## 2015-03-21 NOTE — ED Provider Notes (Signed)
By signing my name below, I, Bethel Born, attest that this documentation has been prepared under the direction and in the presence of Kristen N Ward, DO. Electronically Signed: Bethel Born, ED Scribe. 03/22/2015. 12:47 AM.  TIME SEEN: 11:15 PM  CHIEF COMPLAINT: Right knee pain  HPI: Debra Johnston is a 82 y.o. female with history of hypertension, hypothyroidism, chronic kidney disease, atrial fibrillation, osteoarthritis and chronic pain who presents to the Emergency Department complaining of new, constant, 5/10 in severity, right knee pain with sudden onset tonight while ambulating, with her husband's assistance, to the bathroom. Pt states that she "couldn't put one leg in front of the other" and then it "popped". She denies falling to the ground or head injury.  Pt had a right knee replacement by Dr. Shelle Iron at least 8 years ago. Also complains of chronic lower back pain with no recent change. Pt denies anticoagulation. She is on 2 L of oxygen at home.  Her last meal was near 5:30 PM.  ROS: See HPI Constitutional: no fever  Eyes: no drainage  ENT: no runny nose   Cardiovascular:  no chest pain  Resp: no SOB  GI: no vomiting GU: no dysuria Integumentary: no rash  Allergy: no hives  Musculoskeletal: no leg swelling  Neurological: no slurred speech ROS otherwise negative  PAST MEDICAL HISTORY/PAST SURGICAL HISTORY:  Past Medical History  Diagnosis Date  . GERD (gastroesophageal reflux disease)   . Glaucoma   . Osteoarthritis   . Hyperlipidemia   . Anxiety   . Depression   . Bronchitis 02/22/2011  . Chronic pain   . Hypothyroidism   . DOE (dyspnea on exertion)     chronic   . CKD (chronic kidney disease) stage 4, GFR 15-29 ml/min (HCC)   . Physical deconditioning   . Falls   . PAF (paroxysmal atrial fibrillation) (HCC)     not felt to be a candidate for coumadin  . Hypertension   . CHF (congestive heart failure) (HCC)     felt to have diastolic dysfunction with  normal EF at 60%  . Fibromyalgia     with chronic weakness    MEDICATIONS:  Prior to Admission medications   Medication Sig Start Date End Date Taking? Authorizing Provider  acetaminophen (TYLENOL) 500 MG tablet Take 500 mg by mouth every 6 (six) hours as needed (back pain).    Historical Provider, MD  amLODipine (NORVASC) 5 MG tablet Take 1 tablet (5 mg total) by mouth daily. 10/11/13   Dwana Melena, PA-C  carvedilol (COREG) 25 MG tablet Take 25 mg by mouth 2 (two) times daily with a meal. 09/26/12   Peter M [place], MD  Cholecalciferol (VITAMIN D PO) Take 1 tablet by mouth daily.    Historical Provider, MD  CRESTOR 5 MG tablet Take 5 mg by mouth daily.  12/25/12   Historical Provider, MD  dorzolamide (TRUSOPT) 2 % ophthalmic solution Place 1 drop into both eyes 2 (two) times daily. Patient taking differently: Place 1 drop into both eyes 3 (three) times daily.  03/04/11 11/17/13  Jodelle Gross, NP  dorzolamide (TRUSOPT) 2 % ophthalmic solution Place 1 drop into both eyes 3 (three) times daily.    Historical Provider, MD  ferrous sulfate 325 (65 FE) MG tablet Take 325 mg by mouth every other day.      Historical Provider, MD  FLUoxetine (PROZAC) 20 MG capsule Take 30 mg by mouth daily at 12 noon.     Historical  Provider, MD  fluticasone (FLONASE) 50 MCG/ACT nasal spray Place 2 sprays into both nostrils as needed for allergies or rhinitis.    Historical Provider, MD  furosemide (LASIX) 40 MG tablet Take 1 tablet (40 mg total) by mouth daily. 10/11/13   Dwana Melena, PA-C  levothyroxine (SYNTHROID) 88 MCG tablet Take 1 tablet (88 mcg total) by mouth daily before breakfast. 01/02/13   Peter M [place], MD  LORazepam (ATIVAN) 2 MG tablet Take 2 mg by mouth every 8 (eight) hours as needed. anxiety     Historical Provider, MD  magnesium oxide (MAG-OX) 400 MG tablet Take 800 mg by mouth daily.    Historical Provider, MD  Multiple Vitamins-Minerals (ICAPS PO) Take 1 tablet by mouth daily.      Historical  Provider, MD  nitroGLYCERIN (NITROSTAT) 0.4 MG SL tablet Place 1 tablet (0.4 mg total) under the tongue every 5 (five) minutes as needed for chest pain. 01/29/13 11/30/14  Peter M [place], MD  NON FORMULARY Place 2-6 L into the nose continuous.    Historical Provider, MD  OLANZapine (ZYPREXA) 5 MG tablet Take 5 mg by mouth daily.  11/03/13   Historical Provider, MD  pantoprazole (PROTONIX) 40 MG tablet Take 40 mg by mouth daily. 1 hour before breakfast     Historical Provider, MD  potassium chloride SA (K-DUR,KLOR-CON) 20 MEQ tablet TAKE 30 MEQ DAILY; (1 AND 1/2 TAB DAILY) 10/22/13   Beatrice Lecher, PA-C  predniSONE (STERAPRED UNI-PAK) 10 MG tablet Have 4 more tablets to take 11/13/13   Historical Provider, MD  traMADol (ULTRAM) 50 MG tablet Take 1 tablet (50 mg total) by mouth every 6 (six) hours as needed. 03/16/15   Benjiman Core, MD    ALLERGIES:  Allergies  Allergen Reactions  . Penicillins Swelling and Rash    SOCIAL HISTORY:  Social History  Substance Use Topics  . Smoking status: Never Smoker   . Smokeless tobacco: Never Used  . Alcohol Use: No    FAMILY HISTORY: Family History  Problem Relation Age of Onset  . Heart failure Mother   . Stroke Father   . Lymphoma Brother   . Cancer Brother   . Heart attack Mother     EXAM: BP 138/88 mmHg  Pulse 84  Temp(Src) 97.8 F (36.6 C) (Temporal)  Resp 20  SpO2 98% CONSTITUTIONAL: Alert and oriented and responds appropriately to questions. Elderly, appears uncomfortable, afebrile HEAD: Normocephalic EYES: Conjunctivae clear, PERRL ENT: normal nose; no rhinorrhea; moist mucous membranes; pharynx without lesions noted NECK: Supple, no meningismus, no LAD  CARD: RRR; S1 and S2 appreciated; no murmurs, no clicks, no rubs, no gallops RESP: Normal chest excursion without splinting or tachypnea; breath sounds clear and equal bilaterally; no wheezes, no rhonchi, no rales, no hypoxia or respiratory distress, speaking full  sentences ABD/GI: Normal bowel sounds; non-distended; soft, non-tender, no rebound, no guarding, no peritoneal signs BACK:  The back appears normal and is non-tender to palpation, there is no CVA tenderness EXT: Right leg is shortened and externally rotated. Tender to palpation over the right hip. Old surgical scar noted over the right knee. Decreased range of motion in the right knee and hip secondary to pain. Otherwise Normal ROM in all joints; otherwise extremities are non-tender to palpation; bilateral non-pitting LE edema; normal capillary refill; no cyanosis, no calf tenderness or swelling.  2+ DP pulses bilaterally. SKIN: Normal color for age and race; warm NEURO: Moves all extremities equally, sensation to light touch intact  diffusely, cranial nerves II through XII intact PSYCH: The patient's mood and manner are appropriate. Grooming and personal hygiene are appropriate.  MEDICAL DECISION MAKING: Patient here with right subtrochanteric hip fracture on x-ray. No other sign of injury on exam. Neurovascularly intact distally. We'll obtain screening labs, EKG, chest x-ray. We'll discuss with orthopedics on call.  Will keep NPO.  Pain well-controlled with IV phenyl.  ED PROGRESS:   12:07 AM-Consult complete with Dr.Gioffre (Orthopedic Surgery). He will see pt in AM.  Requests medicine admission.  Pt's PCP is Dr. Tenny Craw.  Patient case explained and discussed. Call ended at 12:08 AM  12:34 AM-Consult complete with Dr. Antionette Char [facility]). Patient case explained and discussed. Agrees to admit patient to a med surg bed for further evaluation and treatment. Call ended at 12:34 AM.  Care transferred to hospitalist service. I will place holding orders per their request. Family updated with plan.     EKG Interpretation  Date/Time:  Tuesday March 22 2015 01:42:30 EST Ventricular Rate:  74 PR Interval:  177 QRS Duration: 98 QT Interval:  408 QTC Calculation: 453 R Axis:   25 Text Interpretation:   Sinus rhythm Probable inferior infarct, age indeterminate Baseline wander in lead(s) V6 No significant change since last tracing Confirmed by WARD,  DO, KRISTEN [phone]) on 03/22/2015 3:23:00 AM        I personally performed the services described in this documentation, which was scribed in my presence. The recorded information has been reviewed and is accurate.      Layla Maw Ward, DO 03/22/15 [phone]

## 2015-03-21 NOTE — ED Notes (Signed)
PT taken to x-ray at this time

## 2015-03-21 NOTE — ED Notes (Signed)
Pt not tolerating movement for fx pan to void, MD placed order for foley.

## 2015-03-22 ENCOUNTER — Encounter (HOSPITAL_COMMUNITY): Payer: Self-pay | Admitting: Family Medicine

## 2015-03-22 ENCOUNTER — Inpatient Hospital Stay (HOSPITAL_COMMUNITY): Payer: Medicare Other

## 2015-03-22 DIAGNOSIS — I509 Heart failure, unspecified: Secondary | ICD-10-CM | POA: Diagnosis not present

## 2015-03-22 DIAGNOSIS — M797 Fibromyalgia: Secondary | ICD-10-CM | POA: Diagnosis present

## 2015-03-22 DIAGNOSIS — R5383 Other fatigue: Secondary | ICD-10-CM | POA: Diagnosis not present

## 2015-03-22 DIAGNOSIS — N179 Acute kidney failure, unspecified: Secondary | ICD-10-CM | POA: Diagnosis present

## 2015-03-22 DIAGNOSIS — H409 Unspecified glaucoma: Secondary | ICD-10-CM | POA: Diagnosis present

## 2015-03-22 DIAGNOSIS — I480 Paroxysmal atrial fibrillation: Secondary | ICD-10-CM

## 2015-03-22 DIAGNOSIS — E039 Hypothyroidism, unspecified: Secondary | ICD-10-CM | POA: Diagnosis present

## 2015-03-22 DIAGNOSIS — S7221XA Displaced subtrochanteric fracture of right femur, initial encounter for closed fracture: Secondary | ICD-10-CM | POA: Diagnosis present

## 2015-03-22 DIAGNOSIS — I5032 Chronic diastolic (congestive) heart failure: Secondary | ICD-10-CM | POA: Diagnosis not present

## 2015-03-22 DIAGNOSIS — I5033 Acute on chronic diastolic (congestive) heart failure: Secondary | ICD-10-CM | POA: Diagnosis present

## 2015-03-22 DIAGNOSIS — Z79899 Other long term (current) drug therapy: Secondary | ICD-10-CM | POA: Diagnosis not present

## 2015-03-22 DIAGNOSIS — N183 Chronic kidney disease, stage 3 unspecified: Secondary | ICD-10-CM | POA: Diagnosis present

## 2015-03-22 DIAGNOSIS — J189 Pneumonia, unspecified organism: Secondary | ICD-10-CM | POA: Diagnosis not present

## 2015-03-22 DIAGNOSIS — M25511 Pain in right shoulder: Secondary | ICD-10-CM | POA: Diagnosis present

## 2015-03-22 DIAGNOSIS — Z01810 Encounter for preprocedural cardiovascular examination: Secondary | ICD-10-CM | POA: Diagnosis not present

## 2015-03-22 DIAGNOSIS — Y92000 Kitchen of unspecified non-institutional (private) residence as  the place of occurrence of the external cause: Secondary | ICD-10-CM | POA: Diagnosis not present

## 2015-03-22 DIAGNOSIS — Z96651 Presence of right artificial knee joint: Secondary | ICD-10-CM | POA: Diagnosis present

## 2015-03-22 DIAGNOSIS — I10 Essential (primary) hypertension: Secondary | ICD-10-CM | POA: Diagnosis not present

## 2015-03-22 DIAGNOSIS — R05 Cough: Secondary | ICD-10-CM | POA: Diagnosis not present

## 2015-03-22 DIAGNOSIS — D72829 Elevated white blood cell count, unspecified: Secondary | ICD-10-CM | POA: Diagnosis not present

## 2015-03-22 DIAGNOSIS — W19XXXA Unspecified fall, initial encounter: Secondary | ICD-10-CM | POA: Diagnosis present

## 2015-03-22 DIAGNOSIS — M79604 Pain in right leg: Secondary | ICD-10-CM | POA: Diagnosis present

## 2015-03-22 DIAGNOSIS — R5381 Other malaise: Secondary | ICD-10-CM | POA: Diagnosis not present

## 2015-03-22 DIAGNOSIS — E785 Hyperlipidemia, unspecified: Secondary | ICD-10-CM | POA: Diagnosis not present

## 2015-03-22 DIAGNOSIS — I130 Hypertensive heart and chronic kidney disease with heart failure and stage 1 through stage 4 chronic kidney disease, or unspecified chronic kidney disease: Secondary | ICD-10-CM | POA: Diagnosis present

## 2015-03-22 DIAGNOSIS — Z8249 Family history of ischemic heart disease and other diseases of the circulatory system: Secondary | ICD-10-CM | POA: Diagnosis not present

## 2015-03-22 DIAGNOSIS — I110 Hypertensive heart disease with heart failure: Secondary | ICD-10-CM | POA: Diagnosis not present

## 2015-03-22 DIAGNOSIS — R32 Unspecified urinary incontinence: Secondary | ICD-10-CM | POA: Diagnosis present

## 2015-03-22 DIAGNOSIS — R918 Other nonspecific abnormal finding of lung field: Secondary | ICD-10-CM | POA: Diagnosis not present

## 2015-03-22 DIAGNOSIS — Z9981 Dependence on supplemental oxygen: Secondary | ICD-10-CM | POA: Diagnosis not present

## 2015-03-22 DIAGNOSIS — K5900 Constipation, unspecified: Secondary | ICD-10-CM | POA: Diagnosis present

## 2015-03-22 DIAGNOSIS — S7291XA Unspecified fracture of right femur, initial encounter for closed fracture: Secondary | ICD-10-CM | POA: Insufficient documentation

## 2015-03-22 DIAGNOSIS — K219 Gastro-esophageal reflux disease without esophagitis: Secondary | ICD-10-CM | POA: Diagnosis present

## 2015-03-22 DIAGNOSIS — M79601 Pain in right arm: Secondary | ICD-10-CM | POA: Diagnosis not present

## 2015-03-22 LAB — TYPE AND SCREEN
ABO/RH(D): O POS
ANTIBODY SCREEN: NEGATIVE

## 2015-03-22 LAB — BASIC METABOLIC PANEL
ANION GAP: 14 (ref 5–15)
BUN: 11 mg/dL (ref 6–20)
CHLORIDE: 105 mmol/L (ref 101–111)
CO2: 23 mmol/L (ref 22–32)
Calcium: 10.3 mg/dL (ref 8.9–10.3)
Creatinine, Ser: 1.06 mg/dL — ABNORMAL HIGH (ref 0.44–1.00)
GFR calc Af Amer: 54 mL/min — ABNORMAL LOW (ref >60)
GFR, EST NON AFRICAN AMERICAN: 47 mL/min — AB (ref >60)
GLUCOSE: 131 mg/dL — AB (ref 65–99)
POTASSIUM: 4.1 mmol/L (ref 3.5–5.1)
Sodium: 142 mmol/L (ref 135–145)

## 2015-03-22 LAB — PROTIME-INR
INR: 1.08 (ref 0.00–1.49)
Prothrombin Time: 14.2 seconds (ref 11.6–15.2)

## 2015-03-22 LAB — ABO/RH: ABO/RH(D): O POS

## 2015-03-22 LAB — APTT: aPTT: 32 seconds (ref 24–37)

## 2015-03-22 MED ORDER — BISACODYL 5 MG PO TBEC
5.0000 mg | DELAYED_RELEASE_TABLET | Freq: Every day | ORAL | Status: DC | PRN
  Administered 2015-03-24: 5 mg via ORAL
  Filled 2015-03-22 (×2): qty 1

## 2015-03-22 MED ORDER — SENNOSIDES-DOCUSATE SODIUM 8.6-50 MG PO TABS: 1.0000 | ORAL_TABLET | Freq: Every evening | ORAL | Status: DC | PRN

## 2015-03-22 MED ORDER — FUROSEMIDE 10 MG/ML IJ SOLN
40.0000 mg | Freq: Two times a day (BID) | INTRAMUSCULAR | Status: DC
  Administered 2015-03-22 – 2015-03-24 (×3): 40 mg via INTRAVENOUS
  Filled 2015-03-22 (×12): qty 4

## 2015-03-22 MED ORDER — MORPHINE SULFATE (PF) 2 MG/ML IV SOLN
0.5000 mg | INTRAVENOUS | Status: DC | PRN
  Administered 2015-03-22 – 2015-03-29 (×14): 0.5 mg via INTRAVENOUS
  Filled 2015-03-22 (×15): qty 1

## 2015-03-22 MED ORDER — ENOXAPARIN SODIUM 40 MG/0.4ML ~~LOC~~ SOLN
40.0000 mg | Freq: Once | SUBCUTANEOUS | Status: AC
  Administered 2015-03-22: 40 mg via SUBCUTANEOUS
  Filled 2015-03-22: qty 0.4

## 2015-03-22 MED ORDER — FLUTICASONE PROPIONATE 50 MCG/ACT NA SUSP
2.0000 | NASAL | Status: DC | PRN
  Administered 2015-03-24: 2 via NASAL
  Filled 2015-03-22 (×2): qty 16

## 2015-03-22 MED ORDER — FERROUS SULFATE 325 (65 FE) MG PO TABS
325.0000 mg | ORAL_TABLET | ORAL | Status: DC
  Administered 2015-03-22: 325 mg via ORAL
  Filled 2015-03-22: qty 1

## 2015-03-22 MED ORDER — OLANZAPINE 5 MG PO TABS
5.0000 mg | ORAL_TABLET | Freq: Every day | ORAL | Status: DC
  Administered 2015-03-22 – 2015-04-01 (×10): 5 mg via ORAL
  Filled 2015-03-22 (×11): qty 1

## 2015-03-22 MED ORDER — VITAMIN D3 25 MCG (1000 UNIT) PO TABS
1000.0000 [IU] | ORAL_TABLET | Freq: Every day | ORAL | Status: DC
  Administered 2015-03-22 – 2015-04-01 (×10): 1000 [IU] via ORAL
  Filled 2015-03-22 (×11): qty 1

## 2015-03-22 MED ORDER — LEVOTHYROXINE SODIUM 88 MCG PO TABS
88.0000 ug | ORAL_TABLET | Freq: Every day | ORAL | Status: DC
  Administered 2015-03-22 – 2015-04-01 (×10): 88 ug via ORAL
  Filled 2015-03-22 (×14): qty 1

## 2015-03-22 MED ORDER — VANCOMYCIN HCL IN DEXTROSE 1-5 GM/200ML-% IV SOLN
1000.0000 mg | INTRAVENOUS | Status: DC
  Filled 2015-03-22: qty 200

## 2015-03-22 MED ORDER — LORAZEPAM 1 MG PO TABS
2.0000 mg | ORAL_TABLET | Freq: Three times a day (TID) | ORAL | Status: DC | PRN
Start: 2015-03-22 — End: 2015-04-01
  Administered 2015-03-22 – 2015-04-01 (×21): 2 mg via ORAL
  Filled 2015-03-22 (×21): qty 2

## 2015-03-22 MED ORDER — AMLODIPINE BESYLATE 5 MG PO TABS
5.0000 mg | ORAL_TABLET | Freq: Every day | ORAL | Status: DC
  Administered 2015-03-22 – 2015-03-31 (×7): 5 mg via ORAL
  Filled 2015-03-22 (×11): qty 1

## 2015-03-22 MED ORDER — DORZOLAMIDE HCL 2 % OP SOLN
1.0000 [drp] | Freq: Three times a day (TID) | OPHTHALMIC | Status: DC
  Administered 2015-03-22 – 2015-04-01 (×26): 1 [drp] via OPHTHALMIC
  Filled 2015-03-22 (×2): qty 10

## 2015-03-22 MED ORDER — VANCOMYCIN HCL IN DEXTROSE 1-5 GM/200ML-% IV SOLN
1000.0000 mg | INTRAVENOUS | Status: AC
  Administered 2015-03-23: 1000 mg via INTRAVENOUS
  Filled 2015-03-22: qty 200

## 2015-03-22 MED ORDER — FLUOXETINE HCL 20 MG PO CAPS
30.0000 mg | ORAL_CAPSULE | Freq: Every day | ORAL | Status: DC
  Administered 2015-03-22 – 2015-04-01 (×11): 30 mg via ORAL
  Filled 2015-03-22 (×11): qty 1

## 2015-03-22 MED ORDER — ADULT MULTIVITAMIN W/MINERALS CH
1.0000 | ORAL_TABLET | Freq: Every day | ORAL | Status: DC
Start: 2015-03-22 — End: 2015-04-01
  Administered 2015-03-22 – 2015-04-01 (×10): 1 via ORAL
  Filled 2015-03-22 (×11): qty 1

## 2015-03-22 MED ORDER — MAGNESIUM OXIDE 400 (241.3 MG) MG PO TABS
800.0000 mg | ORAL_TABLET | Freq: Every day | ORAL | Status: DC
  Administered 2015-03-22 – 2015-04-01 (×10): 800 mg via ORAL
  Filled 2015-03-22 (×11): qty 2

## 2015-03-22 MED ORDER — INFLUENZA VAC SPLIT QUAD 0.5 ML IM SUSY
0.5000 mL | PREFILLED_SYRINGE | INTRAMUSCULAR | Status: DC
  Filled 2015-03-22 (×2): qty 0.5

## 2015-03-22 MED ORDER — METHOCARBAMOL 500 MG PO TABS
500.0000 mg | ORAL_TABLET | Freq: Four times a day (QID) | ORAL | Status: DC | PRN
  Administered 2015-03-22 – 2015-03-23 (×3): 500 mg via ORAL
  Filled 2015-03-22 (×3): qty 1

## 2015-03-22 MED ORDER — HYDROMORPHONE HCL 1 MG/ML IJ SOLN
0.5000 mg | INTRAMUSCULAR | Status: DC | PRN
  Administered 2015-03-22: 0.5 mg via INTRAVENOUS
  Filled 2015-03-22: qty 1

## 2015-03-22 MED ORDER — CARVEDILOL 25 MG PO TABS
25.0000 mg | ORAL_TABLET | Freq: Two times a day (BID) | ORAL | Status: DC
  Administered 2015-03-22 – 2015-04-01 (×20): 25 mg via ORAL
  Filled 2015-03-22 (×24): qty 1
  Filled 2015-03-22: qty 2
  Filled 2015-03-22: qty 1

## 2015-03-22 MED ORDER — FUROSEMIDE 40 MG PO TABS
40.0000 mg | ORAL_TABLET | Freq: Every day | ORAL | Status: DC
  Administered 2015-03-22: 40 mg via ORAL
  Filled 2015-03-22: qty 1

## 2015-03-22 MED ORDER — DEXTROSE IN LACTATED RINGERS 5 % IV SOLN
INTRAVENOUS | Status: DC
  Administered 2015-03-22: 02:00:00 via INTRAVENOUS

## 2015-03-22 MED ORDER — ENOXAPARIN SODIUM 40 MG/0.4ML ~~LOC~~ SOLN
40.0000 mg | Freq: Every day | SUBCUTANEOUS | Status: DC
  Administered 2015-03-22: 40 mg via SUBCUTANEOUS
  Filled 2015-03-22 (×2): qty 0.4

## 2015-03-22 MED ORDER — METHOCARBAMOL 1000 MG/10ML IJ SOLN
500.0000 mg | Freq: Four times a day (QID) | INTRAVENOUS | Status: DC | PRN
  Filled 2015-03-22: qty 5

## 2015-03-22 MED ORDER — PANTOPRAZOLE SODIUM 40 MG PO TBEC
40.0000 mg | DELAYED_RELEASE_TABLET | Freq: Every day | ORAL | Status: DC
  Administered 2015-03-22 – 2015-04-01 (×11): 40 mg via ORAL
  Filled 2015-03-22 (×11): qty 1

## 2015-03-22 MED ORDER — ROSUVASTATIN CALCIUM 5 MG PO TABS
5.0000 mg | ORAL_TABLET | Freq: Every day | ORAL | Status: DC
  Administered 2015-03-22 – 2015-04-01 (×10): 5 mg via ORAL
  Filled 2015-03-22 (×11): qty 1

## 2015-03-22 MED ORDER — HYDROCODONE-ACETAMINOPHEN 5-325 MG PO TABS
1.0000 | ORAL_TABLET | Freq: Four times a day (QID) | ORAL | Status: DC | PRN
  Administered 2015-03-22 (×3): 2 via ORAL
  Administered 2015-03-23: 1 via ORAL
  Administered 2015-03-23 (×2): 2 via ORAL
  Filled 2015-03-22: qty 1
  Filled 2015-03-22 (×2): qty 2
  Filled 2015-03-22: qty 1
  Filled 2015-03-22 (×4): qty 2

## 2015-03-22 NOTE — Progress Notes (Signed)
Patient admitted after midnight-- plan for surgery after cardiac evaluation.  Patient appears to be more limited by dyspnea related to lung.  No chest pain  Marlin Canary DO

## 2015-03-22 NOTE — Progress Notes (Signed)
Utilization review completed. Lorana Maffeo, RN, BSN. 

## 2015-03-22 NOTE — Progress Notes (Signed)
RN gave report to receiving RN at [facility]; Pt transferred via [facility]

## 2015-03-22 NOTE — H&P (Signed)
[facility] History and Physical  Debra Johnston NWG:[id number] DOB: March 02, 1927 DOA: 03/21/2015  Referring physician: ED physician PCP: Donovan Kail, MD  Specialists:  Dr. Sw[place] (cardiology), Dr. Maple Hudson (pulmonology)   Chief Complaint:  Right leg injury, pain  HPI: Debra Johnston is a 82 y.o. female with PMH of hypertension, hypothyroidism, paroxysmal atrial fibrillation, and chronic diastolic CHF with home O2 requirement who presents to the ED with acute onset right leg pain. Patient has history of chronic pain secondary to osteoarthritis and is status post a right total knee replacement. She had been complaining of severe proximal right leg pain for approximately 2 weeks now and had been evaluated by her physician for this complaint. She was ruled out for DVT and no pathology was identified to explain her pain. She has been ambulating with an increasing difficulty over the past 2 weeks due to the leg pain. Tonight, as her husband was helping her to walk to the bathroom, she heard a loud "pop" with acute worsening of her proximal right leg pain.  In ED, patient was found to be afebrile, saturating well on 2 Lpm, and with vital signs stable. The right leg was noted to be shortened and externally rotated and radiographs confirmed a displaced subtrochanteric proximal right femur fracture.  Dr. Darrelyn Hillock of orthopedic surgery was consulted from the emergency department and recommended admission to the hospitalist service. Patient will be admitted for ongoing evaluation and management of proximal femur fracture.    Where does patient live?   At home    Can patient participate in ADLs?  Some   Review of Systems:   General: no fevers, chills, sweats, weight change, poor appetite, or fatigue HEENT: no blurry vision, hearing changes or sore throat Pulm:  Chronic dyspnea, stable. No cough or wheeze CV: no chest pain or palpitations Abd: no nausea, vomiting, abdominal pain, diarrhea, or  constipation GU: no dysuria, hematuria, increased urinary frequency, or urgency  Ext: Chronic LE edema, stable.  Neuro: no focal weakness, numbness, or tingling, no vision change or hearing loss Skin: no rash, no wounds MSK:   See HPI. Aside from right leg, there are no deformities or red, hot, swollen joints.  Heme: No easy bruising or bleeding Travel history: No recent long distant travel    Allergy:  Allergies  Allergen Reactions  . Penicillins Swelling and Rash    Past Medical History  Diagnosis Date  . GERD (gastroesophageal reflux disease)   . Glaucoma   . Osteoarthritis   . Hyperlipidemia   . Anxiety   . Depression   . Bronchitis 02/22/2011  . Chronic pain   . Hypothyroidism   . DOE (dyspnea on exertion)     chronic   . CKD (chronic kidney disease) stage 4, GFR 15-29 ml/min (HCC)   . Physical deconditioning   . Falls   . PAF (paroxysmal atrial fibrillation) (HCC)     not felt to be a candidate for coumadin  . Hypertension   . CHF (congestive heart failure) (HCC)     felt to have diastolic dysfunction with normal EF at 60%  . Fibromyalgia     with chronic weakness    Past Surgical History  Procedure Laterality Date  . Hemorrhoid surgery    . Laparoscopic hysterectomy    . Knee surgery      bilateral  . Cesarean section    . Transthoracic echocardiogram  02/2011    EF 60% with diastolic dysfunction, high filling pressures  . Appendectomy  as a teenage  . Cholecystectomy      pt denies having cholecystectomy    Social History:  reports that she has never smoked. She has never used smokeless tobacco. She reports that she does not drink alcohol or use illicit drugs.  Family History:  Family History  Problem Relation Age of Onset  . Heart failure Mother   . Stroke Father   . Lymphoma Brother   . Cancer Brother   . Heart attack Mother      Prior to Admission medications   Medication Sig Start Date End Date Taking? Authorizing Provider   acetaminophen (TYLENOL) 500 MG tablet Take 500 mg by mouth every 6 (six) hours as needed (back pain).   Yes Historical Provider, MD  amLODipine (NORVASC) 5 MG tablet Take 1 tablet (5 mg total) by mouth daily. 10/11/13  Yes Dwana Melena, PA-C  carvedilol (COREG) 25 MG tablet Take 25 mg by mouth 2 (two) times daily with a meal. 09/26/12  Yes Peter M [place], MD  Cholecalciferol (VITAMIN D PO) Take 1 tablet by mouth daily.   Yes Historical Provider, MD  CRESTOR 5 MG tablet Take 5 mg by mouth daily.  12/25/12  Yes Historical Provider, MD  dorzolamide (TRUSOPT) 2 % ophthalmic solution Place 1 drop into both eyes 2 (two) times daily. Patient taking differently: Place 1 drop into both eyes 3 (three) times daily.  03/04/11 03/21/15 Yes Jodelle Gross, NP  ferrous sulfate 325 (65 FE) MG tablet Take 325 mg by mouth every other day.     Yes Historical Provider, MD  FLUoxetine (PROZAC) 20 MG capsule Take 30 mg by mouth daily at 12 noon.    Yes Historical Provider, MD  fluticasone (FLONASE) 50 MCG/ACT nasal spray Place 2 sprays into both nostrils as needed for allergies or rhinitis.   Yes Historical Provider, MD  furosemide (LASIX) 40 MG tablet Take 1 tablet (40 mg total) by mouth daily. 10/11/13  Yes Dwana Melena, PA-C  levothyroxine (SYNTHROID) 88 MCG tablet Take 1 tablet (88 mcg total) by mouth daily before breakfast. 01/02/13  Yes Peter M [place], MD  LORazepam (ATIVAN) 2 MG tablet Take 2 mg by mouth every 8 (eight) hours as needed. anxiety    Yes Historical Provider, MD  magnesium oxide (MAG-OX) 400 MG tablet Take 800 mg by mouth daily.   Yes Historical Provider, MD  Multiple Vitamin (MULTIVITAMIN WITH MINERALS) TABS tablet Take 1 tablet by mouth daily.   Yes Historical Provider, MD  nitroGLYCERIN (NITROSTAT) 0.4 MG SL tablet Place 1 tablet (0.4 mg total) under the tongue every 5 (five) minutes as needed for chest pain. 01/29/13 03/21/15 Yes Peter M [place], MD  OLANZapine (ZYPREXA) 5 MG tablet Take 5 mg by  mouth daily.  11/03/13  Yes Historical Provider, MD  pantoprazole (PROTONIX) 40 MG tablet Take 40 mg by mouth daily. 1 hour before breakfast    Yes Historical Provider, MD  potassium chloride SA (K-DUR,KLOR-CON) 20 MEQ tablet TAKE 30 MEQ DAILY; (1 AND 1/2 TAB DAILY) 10/22/13  Yes Beatrice Lecher, PA-C  traMADol (ULTRAM) 50 MG tablet Take 1 tablet (50 mg total) by mouth every 6 (six) hours as needed. 03/16/15  Yes Benjiman Core, MD  NON FORMULARY Place 2-6 L into the nose continuous.    Historical Provider, MD    Physical Exam: Filed Vitals:   03/21/15 2345 03/22/15 0015 03/22/15 0045 03/22/15 0100  BP: 155/58 155/62 160/60 161/63  Pulse: 73 75 73 70  Temp:      TempSrc:      Resp:      SpO2: 98% 98% 96% 96%   General: Not in acute distress HEENT:       Eyes: PERRL, EOMI, no scleral icterus or conjunctival pallor.       ENT: No discharge from the ears or nose, no pharyngeal ulcers, petechiae or exudate, no tonsillar enlargement.        Neck: No JVD, no bruit, no appreciable mass Heme: No cervical adenopathy, no pallor Cardiac: Rate ~80 and regular.  Grade III crescendo-decrescendo murmur at upper sternal borders. No gallops or rubs. Pulm: Good air movement bilaterally. No rales, wheezing, rhonchi or rubs. Nasal canula in place Abd: Soft, nondistended, nontender, no rebound pain or gaurding, no mass or organomegaly, BS present. Ext:  Trace pitting edema b/l distal LEs.  2+DP/PT pulse bilaterally. Sensation to light touch intact b/l feet. Right leg shortened and externally rotated; bruising anterolateral right thigh; depression and skin crease over anterior right thigh proximally.  Musculoskeletal:  Aside from RLE findings described above, no other gross deformity, no red, hot, swollen joints  Skin: No rashes or wounds on exposed surfaces  Neuro: Alert, oriented X3, cranial nerves II-XII grossly intact. No focal findings Psych: Patient is not overtly psychotic, appropriate mood and  affect.  Labs on Admission:  Basic Metabolic Panel:  Recent Labs Lab 03/16/15 1252 03/21/15 2340  NA 143 142  K 4.3 4.1  CL 106 105  CO2 25 23  GLUCOSE 103* 131*  BUN 12 11  CREATININE 0.92 1.06*  CALCIUM 10.0 10.3   Liver Function Tests:  Recent Labs Lab 03/16/15 1252  AST 23  ALT 22  ALKPHOS 58  BILITOT 0.7  PROT 6.0*  ALBUMIN 3.5   No results for input(s): LIPASE, AMYLASE in the last 168 hours. No results for input(s): AMMONIA in the last 168 hours. CBC:  Recent Labs Lab 03/16/15 1252 03/21/15 2340  WBC 10.0 7.8  NEUTROABS 6.3 4.4  HGB 12.3 12.0  HCT 39.2 38.6  MCV 96.3 96.5  PLT 282 291   Cardiac Enzymes: No results for input(s): CKTOTAL, CKMB, CKMBINDEX, TROPONINI in the last 168 hours.  BNP (last 3 results)  Recent Labs  03/16/15 1251  BNP 149.3*    ProBNP (last 3 results)  Recent Labs  11/19/14 1248  PROBNP 92.0    CBG: No results for input(s): GLUCAP in the last 168 hours.  Radiological Exams on Admission: Dg Chest 1 View  03/22/2015  CLINICAL DATA:  Fall with right hip fracture. EXAM: CHEST 1 VIEW COMPARISON:  03/16/2015 FINDINGS: Unchanged elevation right hemidiaphragm. Unchanged cardiomediastinal contours, heart at the upper limits normal in size. No pulmonary edema, confluent airspace disease, pleural effusion or pneumothorax. No acute osseous abnormalities are seen. IMPRESSION: No acute process. Electronically Signed   By: Rubye Oaks M.D.   On: 03/22/2015 00:15   Dg Pelvis 1-2 Views  03/21/2015  CLINICAL DATA:  Pop in right hip going to the bathroom tonight. Initial encounter. EXAM: PELVIS - 1-2 VIEW COMPARISON:  None. FINDINGS: Transverse subtrochanteric right femur fracture with medial displacement. No evidence of pelvic ring fracture or diastasis. Located hips. IMPRESSION: Displaced subtrochanteric right femur fracture. Electronically Signed   By: Marnee Spring M.D.   On: 03/21/2015 23:55   Dg Knee 2 Views  Right  03/21/2015  CLINICAL DATA:  Patent right leg beginning tonight. Initial encounter. EXAM: RIGHT KNEE - 1-2 VIEW COMPARISON:  06/16/2007 FINDINGS: There  is no evidence of fracture, dislocation, or joint effusion. Well-seated total knee arthroplasty. Osteopenia IMPRESSION: No acute finding. Total arthroplasty. Electronically Signed   By: Marnee Spring M.D.   On: 03/21/2015 23:54   Dg Femur, Min 2 Views Right  03/21/2015  CLINICAL DATA:  Larey Seat pop in leg EXAM: RIGHT FEMUR 2 VIEWS COMPARISON:  03/16/2015 FINDINGS: There is been interval development of a subtrochanteric proximal femur fracture. There is medial displacement of the distal fracture fragment with overlap of the fracture fragments by 2.3 cm. IMPRESSION: 1. Acute subtrochanteric proximal femur fracture. Electronically Signed   By: Signa Kell M.D.   On: 03/21/2015 23:57    EKG:   Not done in ED, ordered and pending   Assessment/Plan  1. Displaced subtrochanteric proximal femur fracture, right  - Pain at proximal right leg for >2 wks, culminating with "pop" and acute worsening just PTA  - Dr. Darrelyn Hillock of orthopedic surgery consulted from ED and will see patient 1/31  - Keep NPO  - Check INR, EKG, pending  - UA unremarkable, CXR with stable chronic findings only  - Off-load heels, ice right proximal leg, pain-control with IV morphine prn  - Gentle IVF, optimize for potential surgery    2. Chronic diastolic CHF with home O2 requirement - TTE (10/11/13) with moderate LVH, mildly elevated RA pressures - Requiring 2 Lpm supplemental O2  - Evaluated by pulmonology and no pulmonary basis for supplemental O2 requirement identified  - Holding Lasix tonight while gently hydrating with LR at 75 cc/hr; no suggestion of volume o/l on CXR or exam  - Strict I/Os, daily wts,  - Resume Lasix when appropriate    3. Hypertension  - Holding Norasc, Coreg, and Lasix while NPO  - Normotensive currently, will monitor  - IV hydralazine pushes  available prn SBP >180 or DBP >100   4. Paroxysmal atrial fibrillation  - Appears to be sinus on monitor, EKG pending  - Not anticoagulated d/t history of falls per recent cards notes  - Monitoring   5. CKD stage 3 - SCr 1.06 on admission, c/w baseline  - Avoiding nephrotoxins where possible  - Gently hydrating overnight  - Repeat chem panel tomorrow     DVT ppx:  SQ Lovenox      Code Status: Full code Family Communication:   Yes, patient's husband and son at bed side Disposition Plan: Admit to inpatient   Date of Service 03/22/2015    Briscoe Deutscher, MD [facility] Pager [phone]  If 7PM-7AM, please contact night-coverage [email or web address] Password [email or web address] 03/22/2015, 1:28 AM

## 2015-03-22 NOTE — Consult Note (Signed)
Patient ID: Debra Johnston MRN: [id number] DOB/AGE: 08-14-1927 82 y.o.  Admit date: 03/21/2015 Primary Physician Donovan Kail, MD  Primary Cardiologist Peter [place], MD Requesting Physician: Ranee Gosselin, MD  Chief Complaint  Pre-operative risk assessment, hip fracture  HPI: Debra Johnston is an 82F with hypertension, paroxysmal atrial fibrillation and chronic diastolic heart failure on home O2 here with R hip fracture.  She reported 2 weeks of hip pain and then noted a loud "pop" on the evening of 03/21/15.  She was noted to have a shortened and externally rotated leg in the ED.  Xray showed a displaced subtrochanteric proximal right femur fracture.  Orthopedic surgery recommended surgical repair but requested pre-surgical risk assessment.  Debra Johnston reports chronic dyspnea that has been unchanged.  She ambulates around her home but does not get much formal exercise.  She denies chest pain either at rest or with exertion. She notes chronic lower extremity edema that improves with elevation and Lasix. She denies orthopnea or PND. She also has not noted any palpitations, lightheadedness or dizziness.  Review of Systems:  A 12 point review of systems was obtained and was negative with exceptions noted in the history of present illness.  Past Medical History  Diagnosis Date  . GERD (gastroesophageal reflux disease)   . Glaucoma   . Osteoarthritis   . Hyperlipidemia   . Anxiety   . Depression   . Bronchitis 02/22/2011  . Chronic pain   . Hypothyroidism   . DOE (dyspnea on exertion)     chronic   . CKD (chronic kidney disease) stage 4, GFR 15-29 ml/min (HCC)   . Physical deconditioning   . Falls   . PAF (paroxysmal atrial fibrillation) (HCC)     not felt to be a candidate for coumadin  . Hypertension   . CHF (congestive heart failure) (HCC)     felt to have diastolic dysfunction with normal EF at 60%  . Fibromyalgia     with chronic weakness    Medications Prior to Admission    Medication Sig Dispense Refill  . acetaminophen (TYLENOL) 500 MG tablet Take 500 mg by mouth every 6 (six) hours as needed (back pain).    Marland Kitchen amLODipine (NORVASC) 5 MG tablet Take 1 tablet (5 mg total) by mouth daily. 30 tablet 5  . carvedilol (COREG) 25 MG tablet Take 25 mg by mouth 2 (two) times daily with a meal.    . Cholecalciferol (VITAMIN D PO) Take 1 tablet by mouth daily.    . CRESTOR 5 MG tablet Take 5 mg by mouth daily.     . dorzolamide (TRUSOPT) 2 % ophthalmic solution Place 1 drop into both eyes 2 (two) times daily. (Patient taking differently: Place 1 drop into both eyes 3 (three) times daily. ) 10 mL 6  . ferrous sulfate 325 (65 FE) MG tablet Take 325 mg by mouth every other day.      Marland Kitchen FLUoxetine (PROZAC) 20 MG capsule Take 30 mg by mouth daily at 12 noon.     . fluticasone (FLONASE) 50 MCG/ACT nasal spray Place 2 sprays into both nostrils as needed for allergies or rhinitis.    . furosemide (LASIX) 40 MG tablet Take 1 tablet (40 mg total) by mouth daily. 30 tablet 5  . levothyroxine (SYNTHROID) 88 MCG tablet Take 1 tablet (88 mcg total) by mouth daily before breakfast.    . LORazepam (ATIVAN) 2 MG tablet Take 2 mg by mouth every 8 (eight) hours as  needed. anxiety     . magnesium oxide (MAG-OX) 400 MG tablet Take 800 mg by mouth daily.    . Multiple Vitamin (MULTIVITAMIN WITH MINERALS) TABS tablet Take 1 tablet by mouth daily.    . nitroGLYCERIN (NITROSTAT) 0.4 MG SL tablet Place 1 tablet (0.4 mg total) under the tongue every 5 (five) minutes as needed for chest pain. 25 tablet 3  . OLANZapine (ZYPREXA) 5 MG tablet Take 5 mg by mouth daily.     . pantoprazole (PROTONIX) 40 MG tablet Take 40 mg by mouth daily. 1 hour before breakfast     . potassium chloride SA (K-DUR,KLOR-CON) 20 MEQ tablet TAKE 30 MEQ DAILY; (1 AND 1/2 TAB DAILY)    . traMADol (ULTRAM) 50 MG tablet Take 1 tablet (50 mg total) by mouth every 6 (six) hours as needed. 15 tablet 0  . NON FORMULARY Place 2-6 L into  the nose continuous.       Marland Kitchen amLODipine  5 mg Oral Daily  . carvedilol  25 mg Oral BID WC  . cholecalciferol  1,000 Units Oral Daily  . dorzolamide  1 drop Both Eyes TID  . enoxaparin (LOVENOX) injection  40 mg Subcutaneous Daily  . ferrous sulfate  325 mg Oral QODAY  . FLUoxetine  30 mg Oral Daily  . furosemide  40 mg Oral Daily  . [START ON 03/23/2015] Influenza vac split quadrivalent PF  0.5 mL Intramuscular Tomorrow-1000  . levothyroxine  88 mcg Oral QAC breakfast  . magnesium oxide  800 mg Oral Daily  . multivitamin with minerals  1 tablet Oral Daily  . OLANZapine  5 mg Oral Daily  . pantoprazole  40 mg Oral Daily  . rosuvastatin  5 mg Oral Daily    Infusions: . dextrose 5% lactated ringers 75 mL/hr at 03/22/15 0136    Allergies  Allergen Reactions  . Penicillins Swelling and Rash    Social History   Social History  . Marital Status: Married    Spouse Name: N/A  . Number of Children: N/A  . Years of Education: N/A   Occupational History  . Not on file.   Social History Main Topics  . Smoking status: Never Smoker   . Smokeless tobacco: Never Used  . Alcohol Use: No  . Drug Use: No  . Sexual Activity: Not Currently    Birth Control/ Protection: Post-menopausal   Other Topics Concern  . Not on file   Social History Narrative    Family History  Problem Relation Age of Onset  . Heart failure Mother   . Stroke Father   . Lymphoma Brother   . Cancer Brother   . Heart attack Mother     PHYSICAL EXAM: Filed Vitals:   03/22/15 0100 03/22/15 0237  BP: 161/63 163/54  Pulse: 70 77  Temp:  98.3 F (36.8 C)  Resp:       Intake/Output Summary (Last 24 hours) at 03/22/15 1006 Last data filed at 03/22/15 0148  Gross per 24 hour  Intake      0 ml  Output    500 ml  Net   -500 ml    General:  Well appearing. No respiratory difficulty.  Clearly in pain. HEENT: normal Neck: supple. JVP 2cm above clavicle at 45 degrees. Carotids 2+ bilat; no bruits. No  lymphadenopathy or thryomegaly appreciated. Cor: PMI nondisplaced. Regular rate & rhythm. No rubs, gallops or murmurs. Lungs: Clear anteriorly.  Unable to listen to her back given discomfort  with movement. Abdomen: soft, nontender, nondistended. No hepatosplenomegaly. No bruits or masses. Good bowel sounds. Extremities: No cyanosis, clubbing, rash, 2+ pitting edema to the upper tibia bilaterally. Right leg shortened and externally rotated.  Neuro: alert & oriented x 3, cranial nerves grossly intact. moves all 4 extremities w/o difficulty. Affect pleasant.  Results for orders placed or performed during the hospital encounter of 03/21/15 (from the past 24 hour(s))  Urinalysis, Routine w reflex microscopic (not at [facility])     Status: Abnormal   Collection Time: 03/21/15 11:30 PM  Result Value Ref Range   Color, Urine YELLOW YELLOW   APPearance CLOUDY (A) CLEAR   Specific Gravity, Urine 1.011 1.005 - 1.030   pH 6.0 5.0 - 8.0   Glucose, UA NEGATIVE NEGATIVE mg/dL   Hgb urine dipstick NEGATIVE NEGATIVE   Bilirubin Urine NEGATIVE NEGATIVE   Ketones, ur NEGATIVE NEGATIVE mg/dL   Protein, ur NEGATIVE NEGATIVE mg/dL   Nitrite NEGATIVE NEGATIVE   Leukocytes, UA NEGATIVE NEGATIVE  CBC with Differential     Status: Abnormal   Collection Time: 03/21/15 11:40 PM  Result Value Ref Range   WBC 7.8 4.0 - 10.5 K/uL   RBC 4.00 3.87 - 5.11 MIL/uL   Hemoglobin 12.0 12.0 - 15.0 g/dL   HCT 16.1 09.6 - 04.5 %   MCV 96.5 78.0 - 100.0 fL   MCH 30.0 26.0 - 34.0 pg   MCHC 31.1 30.0 - 36.0 g/dL   RDW 40.9 81.1 - 91.4 %   Platelets 291 150 - 400 K/uL   Neutrophils Relative % 57 %   Neutro Abs 4.4 1.7 - 7.7 K/uL   Lymphocytes Relative 21 %   Lymphs Abs 1.7 0.7 - 4.0 K/uL   Monocytes Relative 17 %   Monocytes Absolute 1.4 (H) 0.1 - 1.0 K/uL   Eosinophils Relative 4 %   Eosinophils Absolute 0.3 0.0 - 0.7 K/uL   Basophils Relative 1 %   Basophils Absolute 0.0 0.0 - 0.1 K/uL  Basic metabolic panel     Status:  Abnormal   Collection Time: 03/21/15 11:40 PM  Result Value Ref Range   Sodium 142 135 - 145 mmol/L   Potassium 4.1 3.5 - 5.1 mmol/L   Chloride 105 101 - 111 mmol/L   CO2 23 22 - 32 mmol/L   Glucose, Bld 131 (H) 65 - 99 mg/dL   BUN 11 6 - 20 mg/dL   Creatinine, Ser 7.82 (H) 0.44 - 1.00 mg/dL   Calcium 95.6 8.9 - 21.3 mg/dL   GFR calc non Af Amer 47 (L) >60 mL/min   GFR calc Af Amer 54 (L) >60 mL/min   Anion gap 14 5 - 15  Type and screen [facility]     Status: None   Collection Time: 03/22/15 12:35 AM  Result Value Ref Range   ABO/RH(D) O POS    Antibody Screen NEG    Sample Expiration 03/25/2015   ABO/Rh     Status: None   Collection Time: 03/22/15 12:35 AM  Result Value Ref Range   ABO/RH(D) O POS   APTT     Status: None   Collection Time: 03/22/15 12:42 AM  Result Value Ref Range   aPTT 32 24 - 37 seconds  Protime-INR     Status: None   Collection Time: 03/22/15 12:42 AM  Result Value Ref Range   Prothrombin Time 14.2 11.6 - 15.2 seconds   INR 1.08 0.00 - 1.49   Dg Chest  1 View  03/22/2015  CLINICAL DATA:  Fall with right hip fracture. EXAM: CHEST 1 VIEW COMPARISON:  03/16/2015 FINDINGS: Unchanged elevation right hemidiaphragm. Unchanged cardiomediastinal contours, heart at the upper limits normal in size. No pulmonary edema, confluent airspace disease, pleural effusion or pneumothorax. No acute osseous abnormalities are seen. IMPRESSION: No acute process. Electronically Signed   By: Rubye Oaks M.D.   On: 03/22/2015 00:15   Dg Pelvis 1-2 Views  03/21/2015  CLINICAL DATA:  Pop in right hip going to the bathroom tonight. Initial encounter. EXAM: PELVIS - 1-2 VIEW COMPARISON:  None. FINDINGS: Transverse subtrochanteric right femur fracture with medial displacement. No evidence of pelvic ring fracture or diastasis. Located hips. IMPRESSION: Displaced subtrochanteric right femur fracture. Electronically Signed   By: Marnee Spring M.D.   On: 03/21/2015  23:55   Dg Knee 2 Views Right  03/21/2015  CLINICAL DATA:  Patent right leg beginning tonight. Initial encounter. EXAM: RIGHT KNEE - 1-2 VIEW COMPARISON:  06/16/2007 FINDINGS: There is no evidence of fracture, dislocation, or joint effusion. Well-seated total knee arthroplasty. Osteopenia IMPRESSION: No acute finding. Total arthroplasty. Electronically Signed   By: Marnee Spring M.D.   On: 03/21/2015 23:54   Dg Femur, Min 2 Views Right  03/21/2015  CLINICAL DATA:  [place]are[place] Seat pop in leg EXAM: RIGHT FEMUR 2 VIEWS COMPARISON:  03/16/2015 FINDINGS: There is been interval development of a subtrochanteric proximal femur fracture. There is medial displacement of the distal fracture fragment with overlap of the fracture fragments by 2.3 cm. IMPRESSION: 1. Acute subtrochanteric proximal femur fracture. Electronically Signed   By: Signa Kell M.D.   On: 03/21/2015 23:57   ECG: Sinus rhythm.  Rate 74 BPM.  Prior inferior infarct.  Unchanged from 10/09/13.  Echo 10/11/13: Study Conclusions  - Left ventricle: The cavity size was normal. Wall thickness was increased in a pattern of moderate LVH. Systolic function was normal. The estimated ejection fraction was in the range of 55% to 60%. Left ventricular diastolic function parameters were normal. - Mitral valve: Calcified annulus. Mildly thickened leaflets . - Atrial septum: No defect or patent foramen ovale was identified.   ASSESSMENT/PLAN:  # Pre-surgical risk assessment: Ms. Kivi was in her baseline state of health prior to her fall. She was short of breath which is her baseline. She had no chest pain or symptoms concerning for ischemia. We will evaluate her heart failure but will not pursue any ischemia workup at this time. If her diastolic heart failure is stable, she will not require any further intervention and would be deemed an acceptable risk to pursue surgical repair of her hip.  # Chronic diastolic heart failure, Shortness of breath,  O2: Ms. Montoro's need for chronic oxygen therapy is somewhat perplexing.  It has been attributed to chronic diastolic heart failure, though this seems unlikely, as she has required oxygen even when she is not volume overloaded.  It is quite uncommon for diastolic heart failure to cause hypoxia in the setting of euvolemia.  She was seen in clinic on 11/19/14 and her dyspnea was felt to be due to weight gain, sedentary lifestyle and COPD, which seems quite possible, though she does not have COPD.  Of note, she has also complained of dyspnea when BNP was in the normal range.  She was evaluated for pulmonary disease in 2013 and her dyspnea was not thought to be pulmonary in etiology, though she did have some reversible airway disease.  She is clearly volume overloaded at this  time.  - Echo to assess for new systolic dysfunction - Stop IV fluids - Lasix 40mg  IV bid - Continue carvedilol  # Paroxysmal Atrial fibrillation: Currently in sinus rhythm.  Not an anticoagulation candidate due to falls.  This patients CHA2DS2-VASc Score and unadjusted Ischemic Stroke Rate (% per year) is equal to 4.8 % stroke rate/year from a score of 4  Above score calculated as 1 point each if present [CHF, HTN, DM, Vascular=MI/PAD/Aortic Plaque, Age if 65-74, or Female] Above score calculated as 2 points each if present [Age > 75, or Stroke/TIA/TE]  # Hypertensive heart disease: Blood pressure is poorly controlled.  Her blood pressure may also be elevated due to pain. Continue carvedilol and amlodipine.  Increase Lasix as above.  # Hyperlipidemia: Continue rosuvastatin.  Signed: Jonpaul Lumm C. Duke Salvia, MD, [facility]  03/22/2015, 10:06 AM

## 2015-03-22 NOTE — Consult Note (Signed)
Reason for Consult:Fracture Right Hip Referring Physician: Dr.Vann  Debra Johnston is an 82 y.o. female.  HPI: Patient with a history of Congested Heart Failure simply twisted while standing in her kitchen and felt a snap in her Right Hip and then fell down. She will be maintained on Lovenox.  Past Medical History  Diagnosis Date  . GERD (gastroesophageal reflux disease)   . Glaucoma   . Osteoarthritis   . Hyperlipidemia   . Anxiety   . Depression   . Bronchitis 02/22/2011  . Chronic pain   . Hypothyroidism   . DOE (dyspnea on exertion)     chronic   . CKD (chronic kidney disease) stage 4, GFR 15-29 ml/min (HCC)   . Physical deconditioning   . Falls   . PAF (paroxysmal atrial fibrillation) ([place])     not felt to be a candidate for coumadin  . Hypertension   . CHF (congestive heart failure) ([place])     felt to have diastolic dysfunction with normal EF at 60%  . Fibromyalgia     with chronic weakness    Past Surgical History  Procedure Laterality Date  . Hemorrhoid surgery    . Laparoscopic hysterectomy    . Knee surgery      bilateral  . Cesarean section    . Transthoracic echocardiogram  02/2011    EF 30% with diastolic dysfunction, high filling pressures  . Appendectomy      as a teenage  . Cholecystectomy      pt denies having cholecystectomy    Family History  Problem Relation Age of Onset  . Heart failure Mother   . Stroke Father   . Lymphoma Brother   . Cancer Brother   . Heart attack Mother     Social History:  reports that she has never smoked. She has never used smokeless tobacco. She reports that she does not drink alcohol or use illicit drugs.  Allergies:  Allergies  Allergen Reactions  . Penicillins Swelling and Rash    Medications: I have reviewed the patient's current medications.  Results for orders placed or performed during the hospital encounter of 03/21/15 (from the past 48 hour(s))  Urinalysis, Routine w reflex microscopic (not at  [facility])     Status: Abnormal   Collection Time: 03/21/15 11:30 PM  Result Value Ref Range   Color, Urine YELLOW YELLOW   APPearance CLOUDY (A) CLEAR   Specific Gravity, Urine 1.011 1.005 - 1.030   pH 6.0 5.0 - 8.0   Glucose, UA NEGATIVE NEGATIVE mg/dL   Hgb urine dipstick NEGATIVE NEGATIVE   Bilirubin Urine NEGATIVE NEGATIVE   Ketones, ur NEGATIVE NEGATIVE mg/dL   Protein, ur NEGATIVE NEGATIVE mg/dL   Nitrite NEGATIVE NEGATIVE   Leukocytes, UA NEGATIVE NEGATIVE    Comment: MICROSCOPIC NOT DONE ON URINES WITH NEGATIVE PROTEIN, BLOOD, LEUKOCYTES, NITRITE, OR GLUCOSE <1000 mg/dL.  CBC with Differential     Status: Abnormal   Collection Time: 03/21/15 11:40 PM  Result Value Ref Range   WBC 7.8 4.0 - 10.5 K/uL   RBC 4.00 3.87 - 5.11 MIL/uL   Hemoglobin 12.0 12.0 - 15.0 g/dL   HCT 38.6 36.0 - 46.0 %   MCV 96.5 78.0 - 100.0 fL   MCH 30.0 26.0 - 34.0 pg   MCHC 31.1 30.0 - 36.0 g/dL   RDW 14.8 11.5 - 15.5 %   Platelets 291 150 - 400 K/uL   Neutrophils Relative % 57 %   Neutro Abs  4.4 1.7 - 7.7 K/uL   Lymphocytes Relative 21 %   Lymphs Abs 1.7 0.7 - 4.0 K/uL   Monocytes Relative 17 %   Monocytes Absolute 1.4 (H) 0.1 - 1.0 K/uL   Eosinophils Relative 4 %   Eosinophils Absolute 0.3 0.0 - 0.7 K/uL   Basophils Relative 1 %   Basophils Absolute 0.0 0.0 - 0.1 K/uL  Basic metabolic panel     Status: Abnormal   Collection Time: 03/21/15 11:40 PM  Result Value Ref Range   Sodium 142 135 - 145 mmol/L   Potassium 4.1 3.5 - 5.1 mmol/L   Chloride 105 101 - 111 mmol/L   CO2 23 22 - 32 mmol/L   Glucose, Bld 131 (H) 65 - 99 mg/dL   BUN 11 6 - 20 mg/dL   Creatinine, Ser 1.06 (H) 0.44 - 1.00 mg/dL   Calcium 10.3 8.9 - 10.3 mg/dL   GFR calc non Af Amer 47 (L) >60 mL/min   GFR calc Af Amer 54 (L) >60 mL/min    Comment: (NOTE) The eGFR has been calculated using the CKD EPI equation. This calculation has not been validated in all clinical situations. eGFR's persistently <60 mL/min signify possible  Chronic Kidney Disease.    Anion gap 14 5 - 15  Type and screen [place]     Status: None   Collection Time: 03/22/15 12:35 AM  Result Value Ref Range   ABO/RH(D) O POS    Antibody Screen NEG    Sample Expiration 03/25/2015   ABO/Rh     Status: None   Collection Time: 03/22/15 12:35 AM  Result Value Ref Range   ABO/RH(D) O POS   APTT     Status: None   Collection Time: 03/22/15 12:42 AM  Result Value Ref Range   aPTT 32 24 - 37 seconds  Protime-INR     Status: None   Collection Time: 03/22/15 12:42 AM  Result Value Ref Range   Prothrombin Time 14.2 11.6 - 15.2 seconds   INR 1.08 0.00 - 1.49    Dg Chest 1 View  03/22/2015  CLINICAL DATA:  Fall with right hip fracture. EXAM: CHEST 1 VIEW COMPARISON:  03/16/2015 FINDINGS: Unchanged elevation right hemidiaphragm. Unchanged cardiomediastinal contours, heart at the upper limits normal in size. No pulmonary edema, confluent airspace disease, pleural effusion or pneumothorax. No acute osseous abnormalities are seen. IMPRESSION: No acute process. Electronically Signed   By: Jeb Levering M.D.   On: 03/22/2015 00:15   Dg Pelvis 1-2 Views  03/21/2015  CLINICAL DATA:  Pop in right hip going to the bathroom tonight. Initial encounter. EXAM: PELVIS - 1-2 VIEW COMPARISON:  None. FINDINGS: Transverse subtrochanteric right femur fracture with medial displacement. No evidence of pelvic ring fracture or diastasis. Located hips. IMPRESSION: Displaced subtrochanteric right femur fracture. Electronically Signed   By: Monte Fantasia M.D.   On: 03/21/2015 23:55   Dg Knee 2 Views Right  03/21/2015  CLINICAL DATA:  Patent right leg beginning tonight. Initial encounter. EXAM: RIGHT KNEE - 1-2 VIEW COMPARISON:  06/16/2007 FINDINGS: There is no evidence of fracture, dislocation, or joint effusion. Well-seated total knee arthroplasty. Osteopenia IMPRESSION: No acute finding. Total arthroplasty. Electronically Signed   By: Monte Fantasia  M.D.   On: 03/21/2015 23:54   Dg Femur, Min 2 Views Right  03/21/2015  CLINICAL DATA:  [place] pop in leg EXAM: RIGHT FEMUR 2 VIEWS COMPARISON:  03/16/2015 FINDINGS: There is been interval development of a  subtrochanteric proximal femur fracture. There is medial displacement of the distal fracture fragment with overlap of the fracture fragments by 2.3 cm. IMPRESSION: 1. Acute subtrochanteric proximal femur fracture. Electronically Signed   By: Kerby Moors M.D.   On: 03/21/2015 23:57    Review of Systems  Constitutional: Negative for malaise/fatigue.  HENT: Negative.   Eyes: Negative.   Respiratory: Positive for shortness of breath.   Cardiovascular: Negative for leg swelling.  Gastrointestinal: Negative.   Genitourinary: Negative.   Musculoskeletal: Positive for joint pain and falls.  Skin: Negative.   Neurological: Negative.  Negative for weakness.  Endo/Heme/Allergies: Negative.   Psychiatric/Behavioral: Negative.    Blood pressure 163/54, pulse 77, temperature 98.3 F (36.8 C), temperature source Oral, resp. rate 20, height 5' 10"  (1.778 m), SpO2 95 %. Physical Exam  Constitutional: She is oriented to person, place, and time.  HENT:  Head: Normocephalic.  Eyes: Pupils are equal, round, and reactive to light.  Neck: Normal range of motion.  Cardiovascular: Normal heart sounds.   Respiratory: Breath sounds normal.  GI: Soft.  Musculoskeletal:  Right lowwer extremity is shortened and axternally Rotated.  Neurological: She is oriented to person, place, and time.  Skin: Skin is warm.  Psychiatric: She has a normal mood and affect.    Assessment/Plan: After Cardiac Clearance,she will require an IM Nail to the Right Hip.  Jodean Valade A 03/22/2015, 7:22 AM

## 2015-03-22 NOTE — ED Notes (Signed)
Received verbal orders from Dr. Darrelyn Hillock for admission, MD plans to see patient in AM.

## 2015-03-22 NOTE — Progress Notes (Signed)
  Echocardiogram 2D Echocardiogram has been performed.  Janalyn Harder 03/22/2015, 3:06 PM

## 2015-03-23 ENCOUNTER — Inpatient Hospital Stay (HOSPITAL_COMMUNITY): Payer: Medicare Other | Admitting: Anesthesiology

## 2015-03-23 ENCOUNTER — Inpatient Hospital Stay (HOSPITAL_COMMUNITY): Payer: Medicare Other

## 2015-03-23 ENCOUNTER — Encounter (HOSPITAL_COMMUNITY)
Admission: EM | Disposition: A | Discharge status: Skilled Nursing Facility | Payer: Self-pay | Source: Home / Self Care | Attending: Internal Medicine

## 2015-03-23 ENCOUNTER — Encounter (HOSPITAL_COMMUNITY): Payer: Self-pay | Admitting: Anesthesiology

## 2015-03-23 DIAGNOSIS — J189 Pneumonia, unspecified organism: Secondary | ICD-10-CM | POA: Diagnosis present

## 2015-03-23 DIAGNOSIS — I5032 Chronic diastolic (congestive) heart failure: Secondary | ICD-10-CM

## 2015-03-23 DIAGNOSIS — R509 Fever, unspecified: Secondary | ICD-10-CM | POA: Insufficient documentation

## 2015-03-23 DIAGNOSIS — Z01810 Encounter for preprocedural cardiovascular examination: Secondary | ICD-10-CM

## 2015-03-23 HISTORY — PX: FEMUR IM NAIL: SHX1597

## 2015-03-23 LAB — GLUCOSE, CAPILLARY
GLUCOSE-CAPILLARY: 98 mg/dL (ref 65–99)
GLUCOSE-CAPILLARY: 164 mg/dL — AB (ref 65–99)
Glucose-Capillary: 153 mg/dL — ABNORMAL HIGH (ref 65–99)
Glucose-Capillary: 143 mg/dL — ABNORMAL HIGH (ref 65–99)

## 2015-03-23 LAB — SURGICAL PCR SCREEN
MRSA, PCR: NEGATIVE
STAPHYLOCOCCUS AUREUS: NEGATIVE

## 2015-03-23 LAB — TSH: TSH: 2.617 u[IU]/mL (ref 0.350–4.500)

## 2015-03-23 SURGERY — INSERTION, INTRAMEDULLARY ROD, FEMUR
Anesthesia: General | Site: Leg Upper | Laterality: Right

## 2015-03-23 MED ORDER — HYDROCODONE-ACETAMINOPHEN 5-325 MG PO TABS
1.0000 | ORAL_TABLET | Freq: Four times a day (QID) | ORAL | Status: DC | PRN
  Administered 2015-03-24: 2 via ORAL
  Administered 2015-03-24 – 2015-03-25 (×3): 1 via ORAL
  Administered 2015-03-26 – 2015-04-01 (×20): 2 via ORAL
  Filled 2015-03-23 (×4): qty 2
  Filled 2015-03-23: qty 1
  Filled 2015-03-23 (×12): qty 2
  Filled 2015-03-23: qty 1
  Filled 2015-03-23 (×5): qty 2

## 2015-03-23 MED ORDER — METHOCARBAMOL 500 MG PO TABS
500.0000 mg | ORAL_TABLET | Freq: Four times a day (QID) | ORAL | Status: DC | PRN
  Administered 2015-03-25 – 2015-03-31 (×12): 500 mg via ORAL
  Filled 2015-03-23 (×13): qty 1

## 2015-03-23 MED ORDER — ACETAMINOPHEN 10 MG/ML IV SOLN
INTRAVENOUS | Status: DC | PRN
  Administered 2015-03-23: 1000 mg via INTRAVENOUS

## 2015-03-23 MED ORDER — SUCCINYLCHOLINE CHLORIDE 20 MG/ML IJ SOLN
INTRAMUSCULAR | Status: DC | PRN
  Administered 2015-03-23: 100 mg via INTRAVENOUS

## 2015-03-23 MED ORDER — PHENYLEPHRINE 40 MCG/ML (10ML) SYRINGE FOR IV PUSH (FOR BLOOD PRESSURE SUPPORT)
PREFILLED_SYRINGE | INTRAVENOUS | Status: AC
  Filled 2015-03-23: qty 10

## 2015-03-23 MED ORDER — ONDANSETRON HCL 4 MG/2ML IJ SOLN
INTRAMUSCULAR | Status: DC | PRN
  Administered 2015-03-23: 4 mg via INTRAVENOUS

## 2015-03-23 MED ORDER — LORAZEPAM 0.5 MG PO TABS
0.5000 mg | ORAL_TABLET | Freq: Once | ORAL | Status: AC
  Administered 2015-03-23: 0.5 mg via ORAL
  Filled 2015-03-23: qty 1

## 2015-03-23 MED ORDER — ONDANSETRON HCL 4 MG/2ML IJ SOLN
4.0000 mg | Freq: Four times a day (QID) | INTRAMUSCULAR | Status: DC | PRN
  Administered 2015-03-24 (×2): 4 mg via INTRAVENOUS
  Filled 2015-03-23 (×2): qty 2

## 2015-03-23 MED ORDER — FENTANYL CITRATE (PF) 100 MCG/2ML IJ SOLN
25.0000 ug | INTRAMUSCULAR | Status: DC | PRN
  Administered 2015-03-23 (×3): 25 ug via INTRAVENOUS

## 2015-03-23 MED ORDER — FENTANYL CITRATE (PF) 100 MCG/2ML IJ SOLN
INTRAMUSCULAR | Status: DC | PRN
  Administered 2015-03-23: 25 ug via INTRAVENOUS
  Administered 2015-03-23: 50 ug via INTRAVENOUS
  Administered 2015-03-23: 25 ug via INTRAVENOUS
  Administered 2015-03-23: 50 ug via INTRAVENOUS

## 2015-03-23 MED ORDER — DEXAMETHASONE SODIUM PHOSPHATE 10 MG/ML IJ SOLN
INTRAMUSCULAR | Status: DC | PRN
  Administered 2015-03-23: 10 mg via INTRAVENOUS

## 2015-03-23 MED ORDER — SODIUM CHLORIDE 0.9 % IV SOLN
20.0000 mg | INTRAVENOUS | Status: DC | PRN
  Administered 2015-03-23: 50 ug/min via INTRAVENOUS

## 2015-03-23 MED ORDER — LIP MEDEX EX OINT
TOPICAL_OINTMENT | CUTANEOUS | Status: AC
  Administered 2015-03-23: 21:00:00
  Filled 2015-03-23: qty 7

## 2015-03-23 MED ORDER — ALUM & MAG HYDROXIDE-SIMETH 200-200-20 MG/5ML PO SUSP: 30.0000 mL | ORAL | Status: DC | PRN

## 2015-03-23 MED ORDER — SUGAMMADEX SODIUM 200 MG/2ML IV SOLN
INTRAVENOUS | Status: DC | PRN
  Administered 2015-03-23: 250 mg via INTRAVENOUS

## 2015-03-23 MED ORDER — SUGAMMADEX SODIUM 500 MG/5ML IV SOLN
INTRAVENOUS | Status: AC
  Filled 2015-03-23: qty 5

## 2015-03-23 MED ORDER — ONDANSETRON HCL 4 MG PO TABS
4.0000 mg | ORAL_TABLET | Freq: Four times a day (QID) | ORAL | Status: DC | PRN
  Administered 2015-03-25: 4 mg via ORAL
  Filled 2015-03-23: qty 1

## 2015-03-23 MED ORDER — PHENOL 1.4 % MT LIQD: 1.0000 | OROMUCOSAL | Status: DC | PRN

## 2015-03-23 MED ORDER — LACTATED RINGERS IV SOLN
INTRAVENOUS | Status: DC | PRN
  Administered 2015-03-23: 15:00:00 via INTRAVENOUS

## 2015-03-23 MED ORDER — PHENYLEPHRINE HCL 10 MG/ML IJ SOLN
INTRAMUSCULAR | Status: AC
  Filled 2015-03-23: qty 2

## 2015-03-23 MED ORDER — ONDANSETRON HCL 4 MG/2ML IJ SOLN
INTRAMUSCULAR | Status: AC
  Filled 2015-03-23: qty 2

## 2015-03-23 MED ORDER — FERROUS SULFATE 325 (65 FE) MG PO TABS
325.0000 mg | ORAL_TABLET | Freq: Three times a day (TID) | ORAL | Status: DC
  Administered 2015-03-24 – 2015-04-01 (×23): 325 mg via ORAL
  Filled 2015-03-23 (×29): qty 1

## 2015-03-23 MED ORDER — ROCURONIUM BROMIDE 100 MG/10ML IV SOLN
INTRAVENOUS | Status: DC | PRN
  Administered 2015-03-23: 20 mg via INTRAVENOUS

## 2015-03-23 MED ORDER — LIDOCAINE HCL (CARDIAC) 20 MG/ML IV SOLN
INTRAVENOUS | Status: DC | PRN
  Administered 2015-03-23: 75 mg via INTRAVENOUS

## 2015-03-23 MED ORDER — MENTHOL 3 MG MT LOZG
1.0000 | LOZENGE | OROMUCOSAL | Status: DC | PRN
  Administered 2015-03-24: 3 mg via ORAL
  Filled 2015-03-23: qty 9

## 2015-03-23 MED ORDER — METOCLOPRAMIDE HCL 5 MG/ML IJ SOLN: 5.0000 mg | Freq: Three times a day (TID) | INTRAMUSCULAR | Status: DC | PRN

## 2015-03-23 MED ORDER — ACETAMINOPHEN 10 MG/ML IV SOLN
INTRAVENOUS | Status: AC
  Filled 2015-03-23: qty 100

## 2015-03-23 MED ORDER — ONDANSETRON HCL 4 MG/2ML IJ SOLN: INTRAMUSCULAR | Status: DC | PRN

## 2015-03-23 MED ORDER — DOCUSATE SODIUM 100 MG PO CAPS
100.0000 mg | ORAL_CAPSULE | Freq: Two times a day (BID) | ORAL | Status: DC
  Administered 2015-03-23 – 2015-04-01 (×17): 100 mg via ORAL
  Filled 2015-03-23 (×2): qty 1

## 2015-03-23 MED ORDER — PROPOFOL 10 MG/ML IV BOLUS
INTRAVENOUS | Status: AC
  Filled 2015-03-23: qty 20

## 2015-03-23 MED ORDER — DEXAMETHASONE SODIUM PHOSPHATE 10 MG/ML IJ SOLN
INTRAMUSCULAR | Status: AC
  Filled 2015-03-23: qty 2

## 2015-03-23 MED ORDER — HYDROMORPHONE HCL 1 MG/ML IJ SOLN: 0.1000 mg | INTRAMUSCULAR | Status: DC | PRN

## 2015-03-23 MED ORDER — VANCOMYCIN HCL IN DEXTROSE 1-5 GM/200ML-% IV SOLN
1000.0000 mg | Freq: Two times a day (BID) | INTRAVENOUS | Status: AC
  Administered 2015-03-24: 1000 mg via INTRAVENOUS
  Filled 2015-03-23: qty 200

## 2015-03-23 MED ORDER — VANCOMYCIN HCL IN DEXTROSE 1-5 GM/200ML-% IV SOLN
INTRAVENOUS | Status: AC
  Filled 2015-03-23: qty 200

## 2015-03-23 MED ORDER — FENTANYL CITRATE (PF) 250 MCG/5ML IJ SOLN
INTRAMUSCULAR | Status: AC
  Filled 2015-03-23: qty 5

## 2015-03-23 MED ORDER — ASPIRIN EC 325 MG PO TBEC
325.0000 mg | DELAYED_RELEASE_TABLET | Freq: Every day | ORAL | Status: DC
  Administered 2015-03-24: 325 mg via ORAL
  Filled 2015-03-23: qty 1

## 2015-03-23 MED ORDER — FENTANYL CITRATE (PF) 100 MCG/2ML IJ SOLN
INTRAMUSCULAR | Status: AC
  Filled 2015-03-23: qty 2

## 2015-03-23 MED ORDER — INSULIN ASPART 100 UNIT/ML ~~LOC~~ SOLN
0.0000 [IU] | SUBCUTANEOUS | Status: DC
  Administered 2015-03-23: 3 [IU] via SUBCUTANEOUS
  Administered 2015-03-23 – 2015-03-24 (×2): 2 [IU] via SUBCUTANEOUS
  Administered 2015-03-24 (×2): 3 [IU] via SUBCUTANEOUS

## 2015-03-23 MED ORDER — LEVOFLOXACIN IN D5W 750 MG/150ML IV SOLN
750.0000 mg | INTRAVENOUS | Status: DC
  Administered 2015-03-23 – 2015-03-24 (×2): 750 mg via INTRAVENOUS
  Filled 2015-03-23 (×3): qty 150

## 2015-03-23 MED ORDER — ACETAMINOPHEN 650 MG RE SUPP
650.0000 mg | Freq: Four times a day (QID) | RECTAL | Status: DC | PRN
  Filled 2015-03-23: qty 1

## 2015-03-23 MED ORDER — PHENYLEPHRINE HCL 10 MG/ML IJ SOLN
INTRAMUSCULAR | Status: DC | PRN
  Administered 2015-03-23 (×3): 80 ug via INTRAVENOUS

## 2015-03-23 MED ORDER — SODIUM CHLORIDE 0.9 % IV SOLN
INTRAVENOUS | Status: DC
  Administered 2015-03-23: 21:00:00 via INTRAVENOUS

## 2015-03-23 MED ORDER — METHOCARBAMOL 1000 MG/10ML IJ SOLN
500.0000 mg | Freq: Four times a day (QID) | INTRAVENOUS | Status: DC | PRN
  Administered 2015-03-23: 500 mg via INTRAVENOUS
  Filled 2015-03-23 (×3): qty 5

## 2015-03-23 MED ORDER — PROPOFOL 10 MG/ML IV BOLUS
INTRAVENOUS | Status: DC | PRN
Start: 2015-03-23 — End: 2015-03-23
  Administered 2015-03-23: 120 mg via INTRAVENOUS

## 2015-03-23 MED ORDER — POLYETHYLENE GLYCOL 3350 17 G PO PACK
17.0000 g | PACK | Freq: Every day | ORAL | Status: DC | PRN
  Administered 2015-03-27: 17 g via ORAL
  Filled 2015-03-23: qty 1

## 2015-03-23 MED ORDER — LIDOCAINE HCL (CARDIAC) 20 MG/ML IV SOLN
INTRAVENOUS | Status: AC
  Filled 2015-03-23: qty 5

## 2015-03-23 MED ORDER — METOCLOPRAMIDE HCL 5 MG PO TABS
5.0000 mg | ORAL_TABLET | Freq: Three times a day (TID) | ORAL | Status: DC | PRN
  Filled 2015-03-23: qty 2

## 2015-03-23 MED ORDER — ACETAMINOPHEN 325 MG PO TABS
650.0000 mg | ORAL_TABLET | Freq: Four times a day (QID) | ORAL | Status: DC | PRN
  Administered 2015-03-29 – 2015-03-30 (×2): 650 mg via ORAL
  Filled 2015-03-23 (×2): qty 2

## 2015-03-23 SURGICAL SUPPLY — 47 items
BAG SPEC THK2 15X12 ZIP CLS (MISCELLANEOUS)
BAG ZIPLOCK 12X15 (MISCELLANEOUS) IMPLANT
BIT DRILL 3.8X8 NS (BIT) ×2 IMPLANT
BIT DRILL 5.3 (BIT) ×2 IMPLANT
BIT DRILL 6.5X4.8 (BIT) ×2 IMPLANT
BNDG GAUZE ELAST 4 BULKY (GAUZE/BANDAGES/DRESSINGS) ×3 IMPLANT
COVER MAYO STAND STRL (DRAPES) ×2 IMPLANT
COVER PERINEAL POST (MISCELLANEOUS) ×3 IMPLANT
DRAPE INCISE IOBAN 66X45 STRL (DRAPES) ×2 IMPLANT
DRAPE STERI IOBAN 125X83 (DRAPES) ×3 IMPLANT
DRSG AQUACEL AG ADV 3.5X 4 (GAUZE/BANDAGES/DRESSINGS) ×4 IMPLANT
DRSG AQUACEL AG ADV 3.5X 6 (GAUZE/BANDAGES/DRESSINGS) IMPLANT
DRSG AQUACEL AG ADV 3.5X14 (GAUZE/BANDAGES/DRESSINGS) ×2 IMPLANT
DURAPREP 26ML APPLICATOR (WOUND CARE) ×3 IMPLANT
ELECT REM PT RETURN 9FT ADLT (ELECTROSURGICAL) ×3
ELECTRODE REM PT RTRN 9FT ADLT (ELECTROSURGICAL) ×1 IMPLANT
GAUZE SPONGE 4X4 12PLY STRL (GAUZE/BANDAGES/DRESSINGS) IMPLANT
GLOVE BIOGEL M 7.0 STRL (GLOVE) IMPLANT
GLOVE BIOGEL PI IND STRL 7.5 (GLOVE) ×1 IMPLANT
GLOVE BIOGEL PI IND STRL 8.5 (GLOVE) IMPLANT
GLOVE BIOGEL PI INDICATOR 7.5 (GLOVE) ×2
GLOVE BIOGEL PI INDICATOR 8.5 (GLOVE)
GLOVE ECLIPSE 8.0 STRL XLNG CF (GLOVE) IMPLANT
GLOVE ORTHO TXT STRL SZ7.5 (GLOVE) ×3 IMPLANT
GOWN STRL REUS W/TWL LRG LVL3 (GOWN DISPOSABLE) ×3 IMPLANT
GOWN STRL REUS W/TWL XL LVL3 (GOWN DISPOSABLE) IMPLANT
GUIDEPIN 3.2X17.5 THRD DISP (PIN) ×4 IMPLANT
GUIDEWIRE BALL NOSE 80CM (WIRE) ×2 IMPLANT
KIT BASIN OR (CUSTOM PROCEDURE TRAY) ×3 IMPLANT
MANIFOLD NEPTUNE II (INSTRUMENTS) ×3 IMPLANT
NAIL TROCH RH 11MMX38CM (Nail) ×2 IMPLANT
PACK GENERAL/GYN (CUSTOM PROCEDURE TRAY) ×3 IMPLANT
POSITIONER SURGICAL ARM (MISCELLANEOUS) ×3 IMPLANT
SCREW ACE CORTICAL (Screw) ×3 IMPLANT
SCREW ACE CORTICAL 6.5X70MML (Screw) ×2 IMPLANT
SCREW ACECAP 48MM (Screw) ×2 IMPLANT
SCREW BN FT 75X6.5XST DRV (Screw) IMPLANT
SCREW CANCELLOUS 6.5X100 (Screw) ×2 IMPLANT
SCREWDRIVER HEX TIP 3.5MM (MISCELLANEOUS) ×2 IMPLANT
SPONGE LAP 18X18 X RAY DECT (DISPOSABLE) ×2 IMPLANT
SUT VIC AB 1 CT1 27 (SUTURE) ×3
SUT VIC AB 1 CT1 27XBRD ANTBC (SUTURE) ×1 IMPLANT
SUT VIC AB 1 CT1 36 (SUTURE) ×2 IMPLANT
SUT VIC AB 2-0 CT1 27 (SUTURE) ×9
SUT VIC AB 2-0 CT1 27XBRD (SUTURE) ×2 IMPLANT
SUT VIC AB 2-0 CT1 TAPERPNT 27 (SUTURE) IMPLANT
SUT VLOC 180 0 24IN GS25 (SUTURE) ×2 IMPLANT

## 2015-03-23 NOTE — Interval H&P Note (Signed)
History and Physical Interval Note:  03/23/2015 4:05 PM  Debra Johnston  has presented today for surgery, with the diagnosis of RIGHT HIP FRACTURE  The various methods of treatment have been discussed with the patient and family. After consideration of risks, benefits and other options for treatment, the patient has consented to  Procedure(s): INTRAMEDULLARY (IM) NAIL FEMORAL (Right) as a surgical intervention .  The patient's history has been reviewed, patient examined, no change in status, stable for surgery.  I have reviewed the patient's chart and labs.  Questions were answered to the patient's satisfaction.     Shelda Pal

## 2015-03-23 NOTE — Transfer of Care (Signed)
Immediate Anesthesia Transfer of Care Note  Patient: Debra Johnston  Procedure(s) Performed: Procedure(s): INTRAMEDULLARY (IM) NAIL FEMORAL (Right)  Patient Location: PACU  Anesthesia Type:General  Level of Consciousness: awake  Airway & Oxygen Therapy: Patient Spontanous Breathing and non-rebreather face mask  Post-op Assessment: Report given to RN and Post -op Vital signs reviewed and stable  Post vital signs: Reviewed and stable  Last Vitals:  Filed Vitals:   03/23/15 1400 03/23/15 1444  BP: 123/45 159/47  Pulse: 86 91  Temp: 37.1 C 37.1 C  Resp: 18 16    Complications: No apparent anesthesia complications

## 2015-03-23 NOTE — Clinical Documentation Improvement (Signed)
Internal Medicine  Can the acuity of Chronic Diastolic CHF be further specified? Please document findings in next progress note NOT in BPA drop down box. Thanks!    Acuity - Acute, Chronic, Acute on Chronic   Type - Systolic, Diastolic, Systolic and Diastolic  Other  Clinically Undetermined  Document any associated diagnoses/conditions  Supporting Information:  "Chronic diastolic heart failure, Shortness of breath" "She is clearly volume overloaded at this time" Cardiac Consult.   "Stop IV fluids"                        Cardiac Consult  "Lasix  IV bid                  Cardiac Consult - Continue carvedilol"   "Echo to assess for new systolic dysfunction"   Please exercise your independent, professional judgment when responding. A specific answer is not anticipated or expected.  Thank You,  Shellee Milo RN, BSN, CCDS Health Information Management [place] [phone]: Cell: [phone]

## 2015-03-23 NOTE — Anesthesia Procedure Notes (Signed)
Procedure Name: Intubation Date/Time: 03/23/2015 4:31 PM Performed by: Leroy Libman L Patient Re-evaluated:Patient Re-evaluated prior to inductionOxygen Delivery Method: Circle system utilized Preoxygenation: Pre-oxygenation with 100% oxygen Intubation Type: IV induction Ventilation: Mask ventilation without difficulty and Oral airway inserted - appropriate to patient size Laryngoscope Size: Hyacinth Meeker and 2 Grade View: Grade II Tube type: Oral Tube size: 7.5 mm Number of attempts: 1 Airway Equipment and Method: Stylet Placement Confirmation: ETT inserted through vocal cords under direct vision,  breath sounds checked- equal and bilateral and positive ETCO2 Secured at: 21 cm Tube secured with: Tape Dental Injury: Teeth and Oropharynx as per pre-operative assessment

## 2015-03-23 NOTE — H&P (View-Only) (Signed)
Subjective: Right hip pain.   Objective: Vital signs in last 24 hours: Temp:  [98.5 F (36.9 C)-100.1 F (37.8 C)] 98.8 F (37.1 C) (02/01 0505) Pulse Rate:  [88-96] 88 (02/01 0505) Resp:  [16-20] 18 (02/01 0505) BP: (135-169)/(42-62) 157/62 mmHg (02/01 0505) SpO2:  [92 %-96 %] 96 % (02/01 0505) FiO2 (%):  [2 %] 2 % (01/31 1606) Weight:  [118.3 kg (260 lb 12.9 oz)] 118.3 kg (260 lb 12.9 oz) (02/01 0200)  Intake/Output from previous day: 01/31 0701 - 02/01 0700 In: -  Out: 1325 [Urine:1325] Intake/Output this shift:     Recent Labs  03/21/15 2340  HGB 12.0    Recent Labs  03/21/15 2340  WBC 7.8  RBC 4.00  HCT 38.6  PLT 291    Recent Labs  03/21/15 2340  NA 142  K 4.1  CL 105  CO2 23  BUN 11  CREATININE 1.06*  GLUCOSE 131*  CALCIUM 10.3    Recent Labs  03/22/15 0042  INR 1.08    Neurologically intact Sensation intact distally Dorsiflexion/Plantar flexion intact Compartment soft  Assessment/Plan: IM nailing right femur. Discussed risks and benefits discussed. Also Dr. Olin or myself as surgeon.   Lynn Sissel C 03/23/2015, 8:56 AM      

## 2015-03-23 NOTE — Anesthesia Preprocedure Evaluation (Addendum)
Anesthesia Evaluation  Patient identified by MRN, date of birth, ID band Patient awake    Reviewed: Allergy & Precautions, H&P , NPO status , Patient's Chart, lab work & pertinent test results, reviewed documented beta blocker date and time   Airway Mallampati: II  TM Distance: >3 FB Neck ROM: full    Dental no notable dental hx. (+) Dental Advisory Given, Teeth Intact   Pulmonary shortness of breath and with exertion, pneumonia, resolved,  Acute bronchitis. Uses oxygen at home   Pulmonary exam normal breath sounds clear to auscultation       Cardiovascular Exercise Tolerance: Poor hypertension, Pt. on home beta blockers +CHF and + DOE  Normal cardiovascular exam+ dysrhythmias Atrial Fibrillation  Rhythm:regular Rate:Normal  Diastolic dysfunction with normal EF   Neuro/Psych Anxiety Depression glaucoma negative neurological ROS  negative psych ROS   GI/Hepatic negative GI ROS, Neg liver ROS, GERD  Medicated and Controlled,  Endo/Other  negative endocrine ROSHypothyroidism   Renal/GU Renal diseaseStage 4 chronic kidney disease  negative genitourinary   Musculoskeletal   Abdominal   Peds  Hematology negative hematology ROS (+)   Anesthesia Other Findings   Reproductive/Obstetrics negative OB ROS                            Anesthesia Physical Anesthesia Plan  ASA: III  Anesthesia Plan: General   Post-op Pain Management:    Induction: Intravenous  Airway Management Planned: Oral ETT  Additional Equipment:   Intra-op Plan:   Post-operative Plan: Extubation in OR  Informed Consent: I have reviewed the patients History and Physical, chart, labs and discussed the procedure including the risks, benefits and alternatives for the proposed anesthesia with the patient or authorized representative who has indicated his/her understanding and acceptance.   Dental Advisory Given  Plan  Discussed with: CRNA and Surgeon  Anesthesia Plan Comments:         Anesthesia Quick Evaluation

## 2015-03-23 NOTE — Addendum Note (Signed)
Addendum  created 03/23/15 1949 by Florene Route, CRNA   Modules edited: Anesthesia Medication Administration

## 2015-03-23 NOTE — Progress Notes (Signed)
Subjective: Right hip pain.   Objective: Vital signs in last 24 hours: Temp:  [98.5 F (36.9 C)-100.1 F (37.8 C)] 98.8 F (37.1 C) (02/01 0505) Pulse Rate:  [88-96] 88 (02/01 0505) Resp:  [16-20] 18 (02/01 0505) BP: (135-169)/(42-62) 157/62 mmHg (02/01 0505) SpO2:  [92 %-96 %] 96 % (02/01 0505) FiO2 (%):  [2 %] 2 % (01/31 1606) Weight:  [118.3 kg (260 lb 12.9 oz)] 118.3 kg (260 lb 12.9 oz) (02/01 0200)  Intake/Output from previous day: 01/31 0701 - 02/01 0700 In: -  Out: 1325 [Urine:1325] Intake/Output this shift:     Recent Labs  03/21/15 2340  HGB 12.0    Recent Labs  03/21/15 2340  WBC 7.8  RBC 4.00  HCT 38.6  PLT 291    Recent Labs  03/21/15 2340  NA 142  K 4.1  CL 105  CO2 23  BUN 11  CREATININE 1.06*  GLUCOSE 131*  CALCIUM 10.3    Recent Labs  03/22/15 0042  INR 1.08    Neurologically intact Sensation intact distally Dorsiflexion/Plantar flexion intact Compartment soft  Assessment/Plan: IM nailing right femur. Discussed risks and benefits discussed. Also Dr. Charlann Boxer or myself as Careers adviser.   Debra Johnston C 03/23/2015, 8:56 AM

## 2015-03-23 NOTE — Brief Op Note (Signed)
03/21/2015 - 03/23/2015  7:00 PM  PATIENT:  Debra Johnston  82 y.o. female  PRE-OPERATIVE DIAGNOSIS:  RIGHT SUBTROCHANTERIC femur fracture  POST-OPERATIVE DIAGNOSIS: RIGHT SUBTROCHANTERIC femur fracture  PROCEDURE:  Procedure(s): INTRAMEDULLARY (IM) NAIL FEMORAL (Right)  SURGEON:  Surgeon(s) and Role:    * Durene Romans, MD - Primary  PHYSICIAN ASSISTANT: none  ANESTHESIA:   general  EBL:  Total I/O In: 100 [IV Piggyback:100] Out: 1250 [Urine:900; Blood:350]  BLOOD ADMINISTERED:none  DRAINS: none   LOCAL MEDICATIONS USED:  NONE  SPECIMEN:  No Specimen  DISPOSITION OF SPECIMEN:  N/A  COUNTS:  YES  TOURNIQUET:  * No tourniquets in log *  DICTATION: .Other Dictation: Dictation Number [id number]  PLAN OF CARE: Admit to inpatient   PATIENT DISPOSITION:  PACU - hemodynamically stable.   Delay start of Pharmacological VTE agent (>24hrs) due to surgical blood loss or risk of bleeding: no

## 2015-03-23 NOTE — Progress Notes (Signed)
Debra ESPEY Johnston:[id number] DOB: 06-Apr-1927 DOA: 03/21/2015 PCP: Donovan Kail, MD  Assessment at time of admission on 03/22/15 by Dr Antionette Char Jani Debra Johnston is a 82 y.o. female with PMH of hypertension, hypothyroidism, paroxysmal atrial fibrillation, and chronic diastolic CHF with home O2 requirement who presents to the ED with acute onset right leg pain. Patient has history of chronic pain secondary to osteoarthritis and is status post a right total knee replacement. She had been complaining of severe proximal right leg pain for approximately 2 weeks now and had been evaluated by her physician for this complaint. She was ruled out for DVT and no pathology was identified to explain her pain. She has been ambulating with an increasing difficulty over the past 2 weeks due to the leg pain. Tonight, as her husband was helping her to walk to the bathroom, she heard a loud "pop" with acute worsening of her proximal right leg pain.  In ED, patient was found to be afebrile, saturating well on 2 Lpm, and with vital signs stable. The right leg was noted to be shortened and externally rotated and radiographs confirmed a displaced subtrochanteric proximal right femur fracture. Dr. Darrelyn Hillock of orthopedic surgery was consulted from the emergency department and recommended admission to the hospitalist service. Patient will be admitted for ongoing evaluation and management of proximal femur fracture. Daily Progress Notes since admission 03/23/15: Appreciate cardiology/orthopedics. Patient has low-grade fever and continues to have a hacking cough which started prior to right leg pains. She likely has underlying pneumonia. We will therefore obtain CT chest with contrast, blood cultures and start Levaquin. She seems to be dry. 2-D echocardiogram showed normal systolic function. Discussed Plan with patient's family at the bedside including her daughter-in-law. Problem List Plan  Principal Problem:   Fracture, subtrochanteric, right  femur, closed (HCC) Active Problems:   Chronic diastolic CHF (congestive heart failure) (HCC)   PAF (paroxysmal atrial fibrillation) (HCC)   HTN (hypertension)   CKD (chronic kidney disease) stage 3, GFR 30-59 ml/min   Oxygen dependent   Femur fracture, right (HCC)   Fever   CAP (community acquired pneumonia)   CT chest with contrast  Blood cultures  Levaquin   Follow cardiology/orthopedics recommendations   Code Status: Full Code Family Communication: Family at bedside Disposition Plan: Eventually SNF- later in the week Consultants:  Orthopedics  Cardiology Procedures:  ?For IM Nail today Antibiotics:  Levaquin 2/17>>  HPI/Subjective: Feeling "so,so".  Objective: Filed Vitals:   03/23/15 0505 03/23/15 1000  BP: 157/62 123/44  Pulse: 88 89  Temp: 98.8 F (37.1 C) 99.5 F (37.5 C)  Resp: 18 16    Intake/Output Summary (Last 24 hours) at 03/23/15 1051 Last data filed at 03/23/15 1000  Gross per 24 hour  Intake      0 ml  Output   1575 ml  Net  -1575 ml   Filed Weights   03/23/15 0200  Weight: 118.3 kg (260 lb 12.9 oz)    Exam:   General:  Ill-looking. Warm to touch.  Cardiovascular: S1-S2 normal. No murmurs. Pulse regular.  Respiratory: Good air entry bilaterally. No rhonchi or rales. Coughing.  Abdomen: Soft and nontender. Normal bowel sounds. No organomegaly.  Musculoskeletal: No pedal edema   Neurological: Intact  Data Reviewed: Basic Metabolic Panel:  Recent Labs Lab 03/16/15 1252 03/21/15 2340  NA 143 142  K 4.3 4.1  CL 106 105  CO2 25 23  GLUCOSE 103* 131*  BUN 12 11  CREATININE 0.92 1.06*  CALCIUM 10.0 10.3   Liver Function Tests:  Recent Labs Lab 03/16/15 1252  AST 23  ALT 22  ALKPHOS 58  BILITOT 0.7  PROT 6.0*  ALBUMIN 3.5   No results for input(s): LIPASE, AMYLASE in the last 168 hours. No results for input(s): AMMONIA in the last 168 hours. CBC:  Recent Labs Lab 03/16/15 1252 03/21/15 2340  WBC 10.0  7.8  NEUTROABS 6.3 4.4  HGB 12.3 12.0  HCT 39.2 38.6  MCV 96.3 96.5  PLT 282 291   Cardiac Enzymes: No results for input(s): CKTOTAL, CKMB, CKMBINDEX, TROPONINI in the last 168 hours. BNP (last 3 results)  Recent Labs  03/16/15 1251  BNP 149.3*    ProBNP (last 3 results)  Recent Labs  11/19/14 1248  PROBNP 92.0    CBG: No results for input(s): GLUCAP in the last 168 hours.  Recent Results (from the past 240 hour(s))  Surgical pcr screen     Status: None   Collection Time: 03/23/15  3:19 AM  Result Value Ref Range Status   MRSA, PCR NEGATIVE NEGATIVE Final   Staphylococcus aureus NEGATIVE NEGATIVE Final    Comment:        The Xpert SA Assay (FDA approved for NASAL specimens in patients over 29 years of age), is one component of a comprehensive surveillance program.  Test performance has been validated by [facility] for patients greater than or equal to 85 year old. It is not intended to diagnose infection nor to guide or monitor treatment.      Studies: Dg Chest 1 View  03/22/2015  CLINICAL DATA:  Fall with right hip fracture. EXAM: CHEST 1 VIEW COMPARISON:  03/16/2015 FINDINGS: Unchanged elevation right hemidiaphragm. Unchanged cardiomediastinal contours, heart at the upper limits normal in size. No pulmonary edema, confluent airspace disease, pleural effusion or pneumothorax. No acute osseous abnormalities are seen. IMPRESSION: No acute process. Electronically Signed   By: Rubye Oaks M.D.   On: 03/22/2015 00:15   Dg Pelvis 1-2 Views  03/21/2015  CLINICAL DATA:  Pop in right hip going to the bathroom tonight. Initial encounter. EXAM: PELVIS - 1-2 VIEW COMPARISON:  None. FINDINGS: Transverse subtrochanteric right femur fracture with medial displacement. No evidence of pelvic ring fracture or diastasis. Located hips. IMPRESSION: Displaced subtrochanteric right femur fracture. Electronically Signed   By: Marnee Spring M.D.   On: 03/21/2015 23:55   Dg Knee  2 Views Right  03/21/2015  CLINICAL DATA:  Patent right leg beginning tonight. Initial encounter. EXAM: RIGHT KNEE - 1-2 VIEW COMPARISON:  06/16/2007 FINDINGS: There is no evidence of fracture, dislocation, or joint effusion. Well-seated total knee arthroplasty. Osteopenia IMPRESSION: No acute finding. Total arthroplasty. Electronically Signed   By: Marnee Spring M.D.   On: 03/21/2015 23:54   Dg Femur, Min 2 Views Right  03/21/2015  CLINICAL DATA:  Larey Seat pop in leg EXAM: RIGHT FEMUR 2 VIEWS COMPARISON:  03/16/2015 FINDINGS: There is been interval development of a subtrochanteric proximal femur fracture. There is medial displacement of the distal fracture fragment with overlap of the fracture fragments by 2.3 cm. IMPRESSION: 1. Acute subtrochanteric proximal femur fracture. Electronically Signed   By: Signa Kell M.D.   On: 03/21/2015 23:57    Scheduled Meds: . amLODipine  5 mg Oral Daily  . carvedilol  25 mg Oral BID WC  . cholecalciferol  1,000 Units Oral Daily  . dorzolamide  1 drop Both Eyes TID  . enoxaparin (LOVENOX) injection  40 mg Subcutaneous  Daily  . ferrous sulfate  325 mg Oral QODAY  . FLUoxetine  30 mg Oral Daily  . furosemide  40 mg Intravenous BID  . Influenza vac split quadrivalent PF  0.5 mL Intramuscular Tomorrow-1000  . levofloxacin (LEVAQUIN) IV  750 mg Intravenous Q24H  . levothyroxine  88 mcg Oral QAC breakfast  . magnesium oxide  800 mg Oral Daily  . multivitamin with minerals  1 tablet Oral Daily  . OLANZapine  5 mg Oral Daily  . pantoprazole  40 mg Oral Daily  . rosuvastatin  5 mg Oral Daily  . vancomycin  1,000 mg Intravenous To OR   Continuous Infusions:    Time spent: 25 minutes    Avin Upperman  [facility] Pager [phone]. If 7PM-7AM, please contact night-coverage at [email or web address], password [facility] 03/23/2015, 10:51 AM  LOS: 1 day

## 2015-03-23 NOTE — Anesthesia Postprocedure Evaluation (Addendum)
Anesthesia Post Note  Patient: Debra Johnston  Procedure(s) Performed: Procedure(s) (LRB): INTRAMEDULLARY (IM) NAIL FEMORAL (Right)  Patient location during evaluation: PACU Anesthesia Type: General Level of consciousness: awake and alert Pain management: pain level controlled Vital Signs Assessment: post-procedure vital signs reviewed and stable Respiratory status: spontaneous breathing, nonlabored ventilation, respiratory function stable, patient connected to nasal cannula oxygen and non-rebreather facemask Cardiovascular status: blood pressure returned to baseline and stable Postop Assessment: no signs of nausea or vomiting Anesthetic complications: no Comments: Will transfer to step down unit because of hypoxia ( 80% ) during surgery and emergence.  Patient's respiratory status is stable now but needs close observation in case she deteriorates. Sats are 100% on non-rebreather with 100% O2. Zebulen Simonis MD    Last Vitals:  Filed Vitals:   03/23/15 1906 03/23/15 1915  BP: 158/70 144/57  Pulse: 87 87  Temp: 36.7 C   Resp: 12 16    Last Pain:  Filed Vitals:   03/23/15 1921  PainSc: 10-Worst pain ever        RLE Motor Response: Purposeful movement (03/23/15 1915) RLE Sensation: Full sensation (03/23/15 1915)      Kayla Weekes L

## 2015-03-23 NOTE — Progress Notes (Signed)
57F with hypertension, paroxysmal atrial fibrillation and chronic diastolic heart failure on home O2 here with R hip fracture. She reported 2 weeks of hip pain and then noted a loud "pop" on the evening of 03/21/15. She was noted to have a shortened and externally rotated leg in the ED. Xray showed a displaced subtrochanteric proximal right femur fracture. Orthopedic surgery recommended surgical repair but requested pre-surgical risk assessment.  Subjective: "I feel better"  Minimal SOB with hx of chronic dyspnea  Denies chest pain.   Objective: Vital signs in last 24 hours: Temp:  [98.5 F (36.9 C)-100.1 F (37.8 C)] 98.8 F (37.1 C) (02/01 0505) Pulse Rate:  [88-96] 88 (02/01 0505) Resp:  [16-20] 18 (02/01 0505) BP: (135-169)/(42-62) 157/62 mmHg (02/01 0505) SpO2:  [92 %-96 %] 96 % (02/01 0505) FiO2 (%):  [2 %] 2 % (01/31 1606) Weight:  [260 lb 12.9 oz (118.3 kg)] 260 lb 12.9 oz (118.3 kg) (02/01 0200) Weight change:  Last BM Date: 03/21/15 Intake/Output from previous day: -1325  01/31 0701 - 02/01 0700 In: -  Out: 1325 [Urine:1325] Intake/Output this shift:    PE: General:Pleasant affect, NAD Skin:Warm and dry, brisk capillary refill HEENT:normocephalic, sclera clear, mucus membranes moist Neck:supple, no JVD, no bruits  Heart:S1S2 RRR without murmur, gallup, rub or click Lungs:clear without rales, rhonchi, or wheezes ZOX:WRUE, non tender, + BS, do not palpate liver spleen or masses Ext:no lower ext edema, 2+ pedal pulses, 2+ radial pulses Neuro:alert and oriented, MAE, follows commands, + facial symmetry EKG yesterday with SR   Lab Results:  Recent Labs  03/21/15 2340  WBC 7.8  HGB 12.0  HCT 38.6  PLT 291   BMET  Recent Labs  03/21/15 2340  NA 142  K 4.1  CL 105  CO2 23  GLUCOSE 131*  BUN 11  CREATININE 1.06*  CALCIUM 10.3   No results for input(s): TROPONINI in the last 72 hours.  Invalid input(s): CK, MB  No results found for:  CHOL, HDL, LDLCALC, LDLDIRECT, TRIG, CHOLHDL Lab Results  Component Value Date   HGBA1C 6.0* 02/22/2011     Lab Results  Component Value Date   TSH 1.526 06/19/2011       Studies/Results: Dg Chest 1 View  03/22/2015  CLINICAL DATA:  Fall with right hip fracture. EXAM: CHEST 1 VIEW COMPARISON:  03/16/2015 FINDINGS: Unchanged elevation right hemidiaphragm. Unchanged cardiomediastinal contours, heart at the upper limits normal in size. No pulmonary edema, confluent airspace disease, pleural effusion or pneumothorax. No acute osseous abnormalities are seen. IMPRESSION: No acute process. Electronically Signed   By: Rubye Oaks M.D.   On: 03/22/2015 00:15   Dg Pelvis 1-2 Views  03/21/2015  CLINICAL DATA:  Pop in right hip going to the bathroom tonight. Initial encounter. EXAM: PELVIS - 1-2 VIEW COMPARISON:  None. FINDINGS: Transverse subtrochanteric right femur fracture with medial displacement. No evidence of pelvic ring fracture or diastasis. Located hips. IMPRESSION: Displaced subtrochanteric right femur fracture. Electronically Signed   By: Marnee Spring M.D.   On: 03/21/2015 23:55   Dg Knee 2 Views Right  03/21/2015  CLINICAL DATA:  Patent right leg beginning tonight. Initial encounter. EXAM: RIGHT KNEE - 1-2 VIEW COMPARISON:  06/16/2007 FINDINGS: There is no evidence of fracture, dislocation, or joint effusion. Well-seated total knee arthroplasty. Osteopenia IMPRESSION: No acute finding. Total arthroplasty. Electronically Signed   By: Marnee Spring M.D.   On: 03/21/2015 23:54   Dg Femur, Min  2 Views Right  03/21/2015  CLINICAL DATA:  Larey Seat pop in leg EXAM: RIGHT FEMUR 2 VIEWS COMPARISON:  03/16/2015 FINDINGS: There is been interval development of a subtrochanteric proximal femur fracture. There is medial displacement of the distal fracture fragment with overlap of the fracture fragments by 2.3 cm. IMPRESSION: 1. Acute subtrochanteric proximal femur fracture. Electronically Signed    By: Signa Kell M.D.   On: 03/21/2015 23:57   Echo: Study Conclusions - Left ventricle: The cavity size was normal. There was moderate concentric hypertrophy with severe septal hypertrophy. Systolic function was normal. The estimated ejection fraction was in the range of 55% to 60%. Wall motion was normal; there were no regional wall motion abnormalities. The study is not technically sufficient to allow evaluation of LV diastolic function. - Aorta: Aortic root dimension: 39 mm (ED). - Aortic root: The aortic root was mildly dilated. - Mitral valve: Calcified annulus. There was mild regurgitation. - Right ventricle: The cavity size was mildly dilated. Wall thickness was normal.   Medications: I have reviewed the patient's current medications. Scheduled Meds: . amLODipine  5 mg Oral Daily  . carvedilol  25 mg Oral BID WC  . cholecalciferol  1,000 Units Oral Daily  . dorzolamide  1 drop Both Eyes TID  . enoxaparin (LOVENOX) injection  40 mg Subcutaneous Daily  . ferrous sulfate  325 mg Oral QODAY  . FLUoxetine  30 mg Oral Daily  . furosemide  40 mg Intravenous BID  . Influenza vac split quadrivalent PF  0.5 mL Intramuscular Tomorrow-1000  . levothyroxine  88 mcg Oral QAC breakfast  . magnesium oxide  800 mg Oral Daily  . multivitamin with minerals  1 tablet Oral Daily  . OLANZapine  5 mg Oral Daily  . pantoprazole  40 mg Oral Daily  . rosuvastatin  5 mg Oral Daily  . vancomycin  1,000 mg Intravenous To OR   Continuous Infusions:  PRN Meds:.bisacodyl, fluticasone, HYDROcodone-acetaminophen, LORazepam, methocarbamol **OR** methocarbamol (ROBAXIN)  IV, morphine injection, senna-docusate  Assessment/Plan: Principal Problem:   Fracture, subtrochanteric, right femur, closed (HCC) Active Problems:   Chronic diastolic CHF (congestive heart failure) (HCC)   PAF (paroxysmal atrial fibrillation) (HCC)   HTN (hypertension)   CKD (chronic kidney disease) stage 3, GFR  30-59 ml/min   Oxygen dependent   Femur fracture, right (HCC)  # Chronic diastolic heart failure, Shortness of breath, O2: appears stable. - Echo  For stable systolic function without change in EF 55-60% and continued moderate concentric hypertrophy- unable to eval LV diastolic function.  - Stopped IV fluids - Lasix  IV bid - Continue carvedilol Check BMet in AM  # Paroxysmal Atrial fibrillation: Currently in sinus rhythm. Not an anticoagulation candidate due to falls. This patients CHA2DS2-VASc Score and unadjusted Ischemic Stroke Rate (% per year) is equal to 4.8 % stroke rate/year from a score of 4  Above score calculated as 1 point each if present [CHF, HTN, DM, Vascular=MI/PAD/Aortic Plaque, Age if 65-74, or Female] Above score calculated as 2 points each if present [Age > 75, or Stroke/TIA/TE]  # Hypertensive heart disease: Blood pressure is poorly controlled. Her blood pressure may also be elevated due to pain. Continue carvedilol and amlodipine. Increase Lasix as above. BP continues 157/62  # Hyperlipidemia: Continue rosuvastatin.  # Fractured Rt Hip with plan for IM nailing.  Discussed with Dr. Eldridge Dace and he felt she should be stable to proceed with surgery.   LOS: 1 day   Time spent with  pt. :  15 minutes. [facility] R  Nurse Practitioner Certified Pager [phone] or after 5pm and on weekends call [phone] 03/23/2015, 8:24 AM   I have examined the patient and reviewed assessment and plan and discussed with patient.  Agree with above as stated.  Patient appears stable.  Able to lie flat without dyspnea.  No further cardiac testing planned at this time. EF normal.  Maintaining NSR at this time. Check BMet in AM.  Bellatrix Devonshire S.

## 2015-03-24 ENCOUNTER — Inpatient Hospital Stay (HOSPITAL_COMMUNITY): Payer: Medicare Other

## 2015-03-24 ENCOUNTER — Encounter (HOSPITAL_COMMUNITY): Payer: Self-pay | Admitting: Radiology

## 2015-03-24 DIAGNOSIS — D72829 Elevated white blood cell count, unspecified: Secondary | ICD-10-CM | POA: Diagnosis present

## 2015-03-24 DIAGNOSIS — I5033 Acute on chronic diastolic (congestive) heart failure: Secondary | ICD-10-CM | POA: Diagnosis not present

## 2015-03-24 LAB — COMPREHENSIVE METABOLIC PANEL
ALBUMIN: 3 g/dL — AB (ref 3.5–5.0)
ALK PHOS: 46 U/L (ref 38–126)
ALT: 21 U/L (ref 14–54)
ANION GAP: 9 (ref 5–15)
AST: 28 U/L (ref 15–41)
BILIRUBIN TOTAL: 0.9 mg/dL (ref 0.3–1.2)
BUN: 18 mg/dL (ref 6–20)
CALCIUM: 8.9 mg/dL (ref 8.9–10.3)
CO2: 26 mmol/L (ref 22–32)
Chloride: 100 mmol/L — ABNORMAL LOW (ref 101–111)
Creatinine, Ser: 1.07 mg/dL — ABNORMAL HIGH (ref 0.44–1.00)
GFR calc non Af Amer: 46 mL/min — ABNORMAL LOW (ref >60)
GFR, EST AFRICAN AMERICAN: 53 mL/min — AB (ref >60)
Glucose, Bld: 161 mg/dL — ABNORMAL HIGH (ref 65–99)
POTASSIUM: 4.3 mmol/L (ref 3.5–5.1)
SODIUM: 135 mmol/L (ref 135–145)
TOTAL PROTEIN: 5.8 g/dL — AB (ref 6.5–8.1)

## 2015-03-24 LAB — CBC WITH DIFFERENTIAL/PLATELET
BASOS ABS: 0 10*3/uL (ref 0.0–0.1)
BASOS PCT: 0 %
EOS ABS: 0 10*3/uL (ref 0.0–0.7)
Eosinophils Relative: 0 %
HEMATOCRIT: 31.9 % — AB (ref 36.0–46.0)
HEMOGLOBIN: 10.2 g/dL — AB (ref 12.0–15.0)
LYMPHS ABS: 1.4 10*3/uL (ref 0.7–4.0)
LYMPHS PCT: 11 %
MCH: 30.6 pg (ref 26.0–34.0)
MCHC: 32 g/dL (ref 30.0–36.0)
MCV: 95.8 fL (ref 78.0–100.0)
Monocytes Absolute: 0.9 10*3/uL (ref 0.1–1.0)
Monocytes Relative: 7 %
NEUTROS ABS: 10.2 10*3/uL — AB (ref 1.7–7.7)
Neutrophils Relative %: 82 %
Platelets: 275 10*3/uL (ref 150–400)
RBC: 3.33 MIL/uL — ABNORMAL LOW (ref 3.87–5.11)
RDW: 14.7 % (ref 11.5–15.5)
WBC: 12.5 10*3/uL — ABNORMAL HIGH (ref 4.0–10.5)

## 2015-03-24 LAB — GLUCOSE, CAPILLARY
GLUCOSE-CAPILLARY: 135 mg/dL — AB (ref 65–99)
Glucose-Capillary: 153 mg/dL — ABNORMAL HIGH (ref 65–99)
Glucose-Capillary: 153 mg/dL — ABNORMAL HIGH (ref 65–99)
Glucose-Capillary: 143 mg/dL — ABNORMAL HIGH (ref 65–99)
Glucose-Capillary: 161 mg/dL — ABNORMAL HIGH (ref 65–99)

## 2015-03-24 MED ORDER — INSULIN ASPART 100 UNIT/ML ~~LOC~~ SOLN
0.0000 [IU] | Freq: Three times a day (TID) | SUBCUTANEOUS | Status: DC
  Administered 2015-03-24: 3 [IU] via SUBCUTANEOUS
  Administered 2015-03-25: 2 [IU] via SUBCUTANEOUS
  Administered 2015-03-25: 3 [IU] via SUBCUTANEOUS
  Administered 2015-03-25: 2 [IU] via SUBCUTANEOUS
  Administered 2015-03-26 – 2015-03-27 (×2): 3 [IU] via SUBCUTANEOUS
  Administered 2015-03-29: 2 [IU] via SUBCUTANEOUS

## 2015-03-24 MED ORDER — HYDROCODONE-ACETAMINOPHEN 5-325 MG PO TABS: 1.0000 | ORAL_TABLET | Freq: Four times a day (QID) | ORAL | Status: DC | PRN

## 2015-03-24 MED ORDER — ASPIRIN 325 MG PO TBEC: 325.0000 mg | DELAYED_RELEASE_TABLET | Freq: Two times a day (BID) | ORAL | Status: DC

## 2015-03-24 MED ORDER — ASPIRIN EC 325 MG PO TBEC
325.0000 mg | DELAYED_RELEASE_TABLET | Freq: Two times a day (BID) | ORAL | Status: DC
  Administered 2015-03-24 – 2015-04-01 (×16): 325 mg via ORAL
  Filled 2015-03-24 (×17): qty 1

## 2015-03-24 MED ORDER — IOHEXOL 300 MG/ML  SOLN
75.0000 mL | Freq: Once | INTRAMUSCULAR | Status: AC | PRN
  Administered 2015-03-24: 75 mL via INTRAVENOUS

## 2015-03-24 NOTE — [place] FL2 (Signed)
[place] MEDICAID FL2 LEVEL OF CARE SCREENING TOOL     IDENTIFICATION  Patient Name: Debra Johnston Birthdate: Sep 20, 1927 Sex: female Admission Date (Current Location): 03/21/2015  [facility] and [place] Number:  Producer, television/film/video and Address:  [facility],  [address] [place] [address]      Provider Number: [id number]  Attending Physician Name and Address:  Conley Canal, MD  Relative Name and Phone Number:       Current Level of Care: Hospital Recommended Level of Care: Skilled Nursing Facility Prior Approval Number:    Date Approved/Denied:   PASRR Number: [id number]  Discharge Plan: SNF    Current Diagnoses: Patient Active Problem List   Diagnosis Date Noted  . Acute on chronic diastolic CHF (congestive heart failure), NYHA class 3 (HCC) 03/24/2015  . Leucocytosis 03/24/2015  . Fever 03/23/2015  . CAP (community acquired pneumonia) 03/23/2015  . Preoperative cardiovascular examination   . Fracture, subtrochanteric, right femur, closed (HCC) 03/22/2015  . CKD (chronic kidney disease) stage 3, GFR 30-59 ml/min 03/22/2015  . Oxygen dependent 03/22/2015  . Femur fracture, right (HCC) 03/22/2015  . Fibromyalgia affecting lower leg 03/16/2015  . Leg pain, right 03/16/2015  . (HFpEF) heart failure with preserved ejection fraction (HCC) 10/09/2013  . HTN (hypertension) 07/01/2012  . PAF (paroxysmal atrial fibrillation) (HCC) 06/19/2011  . Hypothyroidism 06/19/2011  . Chronic diastolic CHF (congestive heart failure) (HCC) 03/12/2011  . Renal insufficiency 02/25/2011  . Acute bronchitis 02/22/2011  . Acute on chronic diastolic heart failure (HCC) 02/22/2011  . EDEMA LEG 01/26/2010    Orientation RESPIRATION BLADDER Height & Weight     Self, Time, Situation, Place  O2 Indwelling catheter Weight: 115.2 kg (253 lb 15.5 oz) Height:   (177.8 cm)  BEHAVIORAL SYMPTOMS/MOOD NEUROLOGICAL BOWEL NUTRITION STATUS  Other (Comment) (No Behaviors)    Continent Diet  AMBULATORY STATUS COMMUNICATION OF NEEDS Skin   Extensive Assist Verbally Surgical wounds                       Personal Care Assistance Level of Assistance  Bathing, Feeding, Dressing Bathing Assistance: Limited assistance Feeding assistance: Independent Dressing Assistance: Limited assistance     Functional Limitations Info             SPECIAL CARE FACTORS FREQUENCY  PT (By licensed PT), OT (By licensed OT)     PT Frequency: 5 x wk OT Frequency: 5 x wk            Contractures Contractures Info: Not present    Additional Factors Info  Code Status, Insulin Sliding Scale, Psychotropic Code Status Info: Full Code   Psychotropic Info: prozac, zyprexa Insulin Sliding Scale Info: sliding scale / 3 x daily       Current Medications (03/24/2015):  This is the current hospital active medication list Current Facility-Administered Medications  Medication Dose Route Frequency Provider Last Rate Last Dose  . acetaminophen (TYLENOL) tablet 650 mg  650 mg Oral Q6H PRN Durene Romans, MD       Or  . acetaminophen (TYLENOL) suppository 650 mg  650 mg Rectal Q6H PRN Durene Romans, MD      . alum & mag hydroxide-simeth (MAALOX/MYLANTA) 200-200-20 MG/5ML suspension 30 mL  30 mL Oral Q4H PRN Durene Romans, MD      . amLODipine (NORVASC) tablet 5 mg  5 mg Oral Daily Briscoe Deutscher, MD   5 mg at 03/24/15 1129  . aspirin EC  tablet 325 mg  325 mg Oral BID Durene Romans, MD      . bisacodyl (DULCOLAX) EC tablet 5 mg  5 mg Oral Daily PRN Briscoe Deutscher, MD   5 mg at 03/24/15 1128  . carvedilol (COREG) tablet 25 mg  25 mg Oral BID WC Briscoe Deutscher, MD   25 mg at 03/24/15 0756  . cholecalciferol (VITAMIN D) tablet 1,000 Units  1,000 Units Oral Daily Briscoe Deutscher, MD   1,000 Units at 03/24/15 1128  . docusate sodium (COLACE) capsule 100 mg  100 mg Oral BID Durene Romans, MD   100 mg at 03/24/15 1128  . dorzolamide (TRUSOPT) 2 % ophthalmic solution 1 drop  1 drop Both Eyes TID  Briscoe Deutscher, MD   1 drop at 03/24/15 1130  . ferrous sulfate tablet 325 mg  325 mg Oral TID PC Durene Romans, MD   325 mg at 03/24/15 1129  . FLUoxetine (PROZAC) capsule 30 mg  30 mg Oral Daily Briscoe Deutscher, MD   30 mg at 03/24/15 1129  . fluticasone (FLONASE) 50 MCG/ACT nasal spray 2 spray  2 spray Each Nare PRN Briscoe Deutscher, MD   2 spray at 03/24/15 0559  . furosemide (LASIX) injection 40 mg  40 mg Intravenous BID Chilton Si, MD   40 mg at 03/24/15 0756  . HYDROcodone-acetaminophen (NORCO/VICODIN) 5-325 MG per tablet 1-2 tablet  1-2 tablet Oral Q6H PRN Briscoe Deutscher, MD   1 tablet at 03/23/15 2114  . HYDROcodone-acetaminophen (NORCO/VICODIN) 5-325 MG per tablet 1-2 tablet  1-2 tablet Oral Q6H PRN Durene Romans, MD   2 tablet at 03/24/15 1129  . HYDROmorphone (DILAUDID) injection 0.1 mg  0.1 mg Intravenous Q2H PRN Durene Romans, MD      . Influenza vac split quadrivalent PF (FLUARIX) injection 0.5 mL  0.5 mL Intramuscular Tomorrow-1000 Lavone Neri Opyd, MD   0.5 mL at 03/23/15 1051  . insulin aspart (novoLOG) injection 0-15 Units  0-15 Units Subcutaneous TID WC Simbiso Ranga, MD      . levofloxacin (LEVAQUIN) IVPB 750 mg  750 mg Intravenous Q24H Simbiso Ranga, MD   750 mg at 03/24/15 1202  . levothyroxine (SYNTHROID, LEVOTHROID) tablet 88 mcg  88 mcg Oral QAC breakfast Briscoe Deutscher, MD   88 mcg at 03/24/15 0756  . LORazepam (ATIVAN) tablet 2 mg  2 mg Oral Q8H PRN Briscoe Deutscher, MD   2 mg at 03/24/15 0656  . magnesium oxide (MAG-OX) tablet 800 mg  800 mg Oral Daily Briscoe Deutscher, MD   800 mg at 03/24/15 1127  . menthol-cetylpyridinium (CEPACOL) lozenge 3 mg  1 lozenge Oral PRN Durene Romans, MD   3 mg at 03/24/15 0501   Or  . phenol (CHLORASEPTIC) mouth spray 1 spray  1 spray Mouth/Throat PRN Durene Romans, MD      . methocarbamol (ROBAXIN) tablet 500 mg  500 mg Oral Q6H PRN Briscoe Deutscher, MD   500 mg at 03/23/15 1255   Or  . methocarbamol (ROBAXIN) 500 mg in dextrose 5 % 50 mL IVPB   500 mg Intravenous Q6H PRN Briscoe Deutscher, MD      . methocarbamol (ROBAXIN) tablet 500 mg  500 mg Oral Q6H PRN Durene Romans, MD       Or  . methocarbamol (ROBAXIN) 500 mg in dextrose 5 % 50 mL IVPB  500 mg Intravenous Q6H PRN Durene Romans, MD   500 mg at  03/23/15 1936  . metoCLOPramide (REGLAN) tablet 5-10 mg  5-10 mg Oral Q8H PRN Durene Romans, MD       Or  . metoCLOPramide (REGLAN) injection 5-10 mg  5-10 mg Intravenous Q8H PRN Durene Romans, MD      . morphine 2 MG/ML injection 0.5 mg  0.5 mg Intravenous Q2H PRN Briscoe Deutscher, MD   0.5 mg at 03/24/15 0501  . multivitamin with minerals tablet 1 tablet  1 tablet Oral Daily Briscoe Deutscher, MD   1 tablet at 03/24/15 1128  . OLANZapine (ZYPREXA) tablet 5 mg  5 mg Oral Daily Briscoe Deutscher, MD   5 mg at 03/24/15 1129  . ondansetron (ZOFRAN) tablet 4 mg  4 mg Oral Q6H PRN Durene Romans, MD       Or  . ondansetron [facility]) injection 4 mg  4 mg Intravenous Q6H PRN Durene Romans, MD   4 mg at 03/24/15 0256  . pantoprazole (PROTONIX) EC tablet 40 mg  40 mg Oral Daily Briscoe Deutscher, MD   40 mg at 03/24/15 1128  . polyethylene glycol (MIRALAX / GLYCOLAX) packet 17 g  17 g Oral Daily PRN Durene Romans, MD      . rosuvastatin (CRESTOR) tablet 5 mg  5 mg Oral Daily Briscoe Deutscher, MD   5 mg at 03/24/15 1128  . senna-docusate (Senokot-S) tablet 1 tablet  1 tablet Oral QHS PRN Briscoe Deutscher, MD         Discharge Medications: Please see discharge summary for a list of discharge medications.  Relevant Imaging Results:  Relevant Lab Results:   Additional Information SS # [id number]  Biana Haggar, Dickey Gave, LCSW

## 2015-03-24 NOTE — Progress Notes (Signed)
     Subjective: 1 Day Post-Op Procedure(s) (LRB): INTRAMEDULLARY (IM) NAIL FEMORAL (Right)   Patient reports pain as moderate, feels that she is pretty sore from surgery.  No events throughout the night.   Objective:   VITALS:    03/24/15  BP: 136/45  Pulse: 87  Temp: 99.2 F (37.3 C)  Resp: 20    Dorsiflexion/Plantar flexion intact Incision: dressing C/D/I No cellulitis present Compartment soft  LABS  Recent Labs  03/21/15 2340 03/24/15 0323  HGB 12.0 10.2*  HCT 38.6 31.9*  WBC 7.8 12.5*  PLT 291 275     Recent Labs  03/21/15 2340 03/24/15 0323  NA 142 135  K 4.1 4.3  BUN 11 18  CREATININE 1.06* 1.07*  GLUCOSE 131* 161*     Assessment/Plan: 1 Day Post-Op Procedure(s) (LRB): INTRAMEDULLARY (IM) NAIL FEMORAL (Right)  Up with therapy Discharge to SNF eventually when ready medically  Ortho recommendations:  ASA 325 mg bid for 4 weeks for anticoagulation, unless other medically indicated.  Norco for pain management (Rx written).  MiraLax and Colace for constipation  Iron 325 mg tid for 2-3 weeks   PWB 50%on the right leg.  Dressing to remain in place until follow in clinic in 2 weeks.  Dressing is waterproof and may shower with it in place.  Follow up in 2 weeks at [facility]. Follow up with OLIN,Neyda Durango D in 2 weeks.  Contact information:  [facility] [address], [address] [place] [address] [phone]      Anastasio Auerbach. Keyondre Hepburn   PAC  03/24/2015, 3:29 PM

## 2015-03-24 NOTE — Clinical Social Work Note (Signed)
Clinical Social Work Assessment  Patient Details  Name: Debra Johnston MRN: [id number] Date of Birth: Jan 26, 1928  Date of referral:  03/24/15               Reason for consult:  Facility Placement, Discharge Planning                Permission sought to share information with:  Chartered certified accountant granted to share information::  Yes, Verbal Permission Granted  Name::        Agency::     Relationship::     Contact Information:     Housing/Transportation Living arrangements for the past 2 months:  Single Family Home Source of Information:  Patient, Spouse Patient Interpreter Needed:  None Criminal Activity/Legal Involvement Pertinent to Current Situation/Hospitalization:  No - Comment as needed Significant Relationships:  Spouse Lives with:    Do you feel safe going back to the place where you live?  No (SNF placement needed.) Need for family participation in patient care:  Yes (Comment)  Care giving concerns:  Pt's care cannot be managed at home following hospital d/c.    Social Worker assessment / plan:  Pt hospitalized on 03/21/15 with a right femur fx. Surgery has been completed. CSW has been consulted for SNF placement. CSW has met with pt / spouse to assist with d/c planning. PT has recommended SNF at d/c. Pt / spouse are in agreement with this plan and have requested [facility] for [facility]. CSW has sent clinicals to SNF and a decision is pending. CSW will assist with [facility] authorization process.  Employment status:  Retired Nurse, adult PT Recommendations:  [place] / Referral to community resources:  [place]  Patient/Family's Response to care:  Pt / spouse agree that SNF placement is needed.  Patient/Family's Understanding of and Emotional Response to Diagnosis, Current Treatment, and Prognosis: Pt / spouse are aware of pt's medical status. They are hopeful that [facility] will have an opening at d/c.  Emotional Assessment Appearance:  Appears stated age Attitude/Demeanor/Rapport:  Other (cooperative) Affect (typically observed):  Calm, Pleasant, Appropriate Orientation:  Oriented to Self, Oriented to Place, Oriented to  Time, Oriented to Situation Alcohol / Substance use:  Not Applicable Psych involvement (Current and /or in the community):  No (Comment)  Discharge Needs  Concerns to be addressed:  Discharge Planning Concerns Readmission within the last 30 days:  No Current discharge risk:  None Barriers to Discharge:  No Barriers Identified   Luretha Rued, [place] 03/24/2015, 2:27 PM

## 2015-03-24 NOTE — Progress Notes (Signed)
HARVEST DEIST NFA:[id number] DOB: [birth year] DOA: 03/21/2015 PCP: Donovan Kail, MD  Assessment at time of admission on 03/22/15 by Dr Antionette Char Debra Johnston is a 82 y.o. female with PMH of hypertension, hypothyroidism, paroxysmal atrial fibrillation, and chronic diastolic CHF with home O2 requirement who presents to the ED with acute onset right leg pain. Patient has history of chronic pain secondary to osteoarthritis and is status post a right total knee replacement. She had been complaining of severe proximal right leg pain for approximately 2 weeks now and had been evaluated by her physician for this complaint. She was ruled out for DVT and no pathology was identified to explain her pain. She has been ambulating with an increasing difficulty over the past 2 weeks due to the leg pain. Tonight, as her husband was helping her to walk to the bathroom, she heard a loud "pop" with acute worsening of her proximal right leg pain.  In ED, patient was found to be afebrile, saturating well on 2 Lpm, and with vital signs stable. The right leg was noted to be shortened and externally rotated and radiographs confirmed a displaced subtrochanteric proximal right femur fracture. Dr. Darrelyn Hillock of orthopedic surgery was consulted from the emergency department and recommended admission to the hospitalist service. Patient will be admitted for ongoing evaluation and management of proximal femur fracture. Daily Progress Notes since admission 03/23/15: Appreciate cardiology/orthopedics. Patient has low-grade fever and continues to have a hacking cough which started prior to right leg pains. She likely has underlying pneumonia. We will therefore obtain CT chest with contrast, blood cultures and start Levaquin. She seems to be dry. 2-D echocardiogram showed normal systolic function. Discussed Plan with patient's family at the bedside including her daughter-in-law. 03/24/15: Appreciate orthopedics/cardiology. Status post ORIF on 03/23/15.  White count has increased to 12,500. No fever. Leukocytosis probably has an element of leukaemoid reaction to it. CT chest done this morning but results pending. Will continue current management including antibiotics(levaquin), diuretics per cardiology and transferred to orthopedics unit later today when bed becomes available. Patient did okay overnight with no hypoxia or hypotension. She feels weak. Problem List Plan  Principal Problem:   Fracture, subtrochanteric, right femur, closed (HCC) Active Problems:   Chronic diastolic CHF (congestive heart failure) (HCC)   PAF (paroxysmal atrial fibrillation) (HCC)   HTN (hypertension)   CKD (chronic kidney disease) stage 3, GFR 30-59 ml/min   Oxygen dependent   Femur fracture, right (HCC)   Fever   CAP (community acquired pneumonia)   Preoperative cardiovascular examination   Acute on chronic diastolic CHF (congestive heart failure), NYHA class 3 (HCC)   Leucocytosis   Monitor white count, if increasing change antibiotics  Day 2 Levaquin  Follow CT chest results  Transfer to orthopedics unit when bed becomes available   Follow cardiology/orthopedics recommendations   Patient will eventually need skilled nursing facility placement -some arrangements were being made outpatient before this event.   Code Status: Full Code Family Communication: No Family at bedside Disposition Plan: Eventually SNF- later in the week/?weekend Consultants:  Orthopedics  Cardiology Procedures:  IM Nail right femur neck Antibiotics:  2/17>>  HPI/Subjective: Says she feels weak. Cough is better. Has pain right hip.  Objective: Filed Vitals:   03/24/15 0700 03/24/15 0800  BP: 147/45 136/45  Pulse: 79 87  Temp:  99.2 F (37.3 C)  Resp: 17 20    Intake/Output Summary (Last 24 hours) at 03/24/15 1045 Last data filed at 03/24/15 0900  Gross per 24  hour  Intake 1328.33 ml  Output   2010 ml  Net -681.67 ml   Filed Weights   03/23/15 0200  03/23/15 2100  Weight: 118.3 kg (260 lb 12.9 oz) 115.2 kg (253 lb 15.5 oz)    Exam:   General:  Comfortable at rest.  Cardiovascular: S1-S2 normal. No murmurs. Pulse regular.  Respiratory: Good air entry bilaterally. No rhonchi or rales.  Abdomen: Soft and nontender. Normal bowel sounds. No organomegaly.  Musculoskeletal: No pedal edema. No bleed from right femoral surgical site.  Neurological: Intact  Data Reviewed: Basic Metabolic Panel:  Recent Labs Lab 03/21/15 2340 03/24/15 0323  NA 142 135  K 4.1 4.3  CL 105 100*  CO2 23 26  GLUCOSE 131* 161*  BUN 11 18  CREATININE 1.06* 1.07*  CALCIUM 10.3 8.9   Liver Function Tests:  Recent Labs Lab 03/24/15 0323  AST 28  ALT 21  ALKPHOS 46  BILITOT 0.9  PROT 5.8*  ALBUMIN 3.0*   No results for input(s): LIPASE, AMYLASE in the last 168 hours. No results for input(s): AMMONIA in the last 168 hours. CBC:  Recent Labs Lab 03/21/15 2340 03/24/15 0323  WBC 7.8 12.5*  NEUTROABS 4.4 10.2*  HGB 12.0 10.2*  HCT 38.6 31.9*  MCV 96.5 95.8  PLT 291 275   Cardiac Enzymes: No results for input(s): CKTOTAL, CKMB, CKMBINDEX, TROPONINI in the last 168 hours. BNP (last 3 results)  Recent Labs  03/16/15 1251  BNP 149.3*    ProBNP (last 3 results)  Recent Labs  11/19/14 1248  PROBNP 92.0    CBG:  Recent Labs Lab 03/23/15 1914 03/23/15 2121 03/23/15 2329 03/24/15 0348 03/24/15 0749  GLUCAP 153* 164* 143* 153* 153*    Recent Results (from the past 240 hour(s))  Surgical pcr screen     Status: None   Collection Time: 03/23/15  3:19 AM  Result Value Ref Range Status   MRSA, PCR NEGATIVE NEGATIVE Final   Staphylococcus aureus NEGATIVE NEGATIVE Final    Comment:        The Xpert SA Assay (FDA approved for NASAL specimens in patients over 62 years of age), is one component of a comprehensive surveillance program.  Test performance has been validated by [facility] for patients greater than or  equal to 23 year old. It is not intended to diagnose infection nor to guide or monitor treatment.      Studies: Dg C-arm 61-120 Min-no Report  03/23/2015  CLINICAL DATA: surgery C-ARM 61-120 MINUTES Fluoroscopy was utilized by the requesting physician.  No radiographic interpretation.   Dg Femur, Min 2 Views Right  03/23/2015  CLINICAL DATA:  Right femoral IM nail. EXAM: RIGHT FEMUR 2 VIEWS; DG C-ARM 61-120 MIN-NO REPORT COMPARISON:  Preoperative radiographs 03/21/2015 FINDINGS: Four fluoroscopic spot images post IM nail insertion demonstrates intra medullary nail in the right femur with proximal and distal locking screws. A trans trochanteric screw is seen. Improved fracture alignment compared to preoperative exam. Fluoroscopy time reported 3 minutes 59 seconds. Exposure 61.94 mGy. IMPRESSION: Fluoroscopic spot images post IM nail insertion fixating subtrochanteric right femur fracture. Improved fracture alignment. Electronically Signed   By: Rubye Oaks M.D.   On: 03/23/2015 18:39    Scheduled Meds: . amLODipine  5 mg Oral Daily  . aspirin EC  325 mg Oral BID  . carvedilol  25 mg Oral BID WC  . cholecalciferol  1,000 Units Oral Daily  . docusate sodium  100 mg Oral BID  .  dorzolamide  1 drop Both Eyes TID  . ferrous sulfate  325 mg Oral TID PC  . FLUoxetine  30 mg Oral Daily  . furosemide  40 mg Intravenous BID  . Influenza vac split quadrivalent PF  0.5 mL Intramuscular Tomorrow-1000  . insulin aspart  0-15 Units Subcutaneous 6 times per day  . levofloxacin (LEVAQUIN) IV  750 mg Intravenous Q24H  . levothyroxine  88 mcg Oral QAC breakfast  . magnesium oxide  800 mg Oral Daily  . multivitamin with minerals  1 tablet Oral Daily  . OLANZapine  5 mg Oral Daily  . pantoprazole  40 mg Oral Daily  . rosuvastatin  5 mg Oral Daily   Continuous Infusions:    Time spent: 25 minutes    Burlene Montecalvo  [facility] Pager [phone]. If 7PM-7AM, please contact night-coverage  at [email or web address], password [facility] 03/24/2015, 10:45 AM  LOS: 2 days

## 2015-03-24 NOTE — Evaluation (Signed)
Occupational Therapy Evaluation Patient Details Name: Debra Johnston MRN: [id number] DOB: 03/12/1927 Today's Date: 03/24/2015    History of Present Illness This 82 year old female was admitted R hip fx and is s/p ORIF.  She had been having severe R LE pain for 2 weeks prior to admission.  Pt has a PMH significant for HTN, A-Fib, CHF, and uses home 02   Clinical Impression   Pt was admitted for the above.  Evaluation was limited by pt being dizzy and c/o weakness when sitting up.  She needed help recently with ambulating and LB adls due to RLE pain.  She will benefit from skilled OT to increase safety and independence with adls.  Goals for sit to stand and toilet transfers are for mod to max A +2 in acute setting. She will benefit from continued OT at SNF to further regain independence    Follow Up Recommendations  SNF    Equipment Recommendations  3 in 1 bedside comode    Recommendations for Other Services       Precautions / Restrictions Precautions Precautions: Fall Restrictions Weight Bearing Restrictions: Yes Other Position/Activity Restrictions: PWB      Mobility Bed Mobility Overal bed mobility: Needs Assistance Bed Mobility: Supine to Sit;Sit to Supine     Supine to sit: Max assist;+2 for physical assistance Sit to supine: Total assist;+2 for physical assistance   General bed mobility comments: assist for LEs and trunk to sit up.  total A to lie down due to dizziness  Transfers                 General transfer comment: not attempted    Balance Overall balance assessment: Needs assistance;History of Falls     Sitting balance - Comments: min guard for safety; pt had dizziness                                    ADL Overall ADL's : Needs assistance/impaired     Grooming: Set up;Bed level   Upper Body Bathing: Set up;Bed level   Lower Body Bathing: Total assistance;+2 for physical assistance;Bed level   Upper Body Dressing :  Minimal assistance;Bed level (lines)   Lower Body Dressing: Total assistance;+2 for physical assistance;Bed level                 General ADL Comments: assisted pt to EOB.  She felt dizzy.  could not get BP sitting.  When repositioned back to supine, BP was 106/62.  Pt is able to perform UB adls from bed level, but she could not tolerate this from sitting due to dizziness.       Vision     Perception     Praxis      Pertinent Vitals/Pain Pain Assessment: Faces Faces Pain Scale: Hurts even more Pain Location: R LE Pain Intervention(s): Limited activity within patient's tolerance;Monitored during session;Repositioned     Hand Dominance     Extremity/Trunk Assessment Upper Extremity Assessment Upper Extremity Assessment: Overall WFL for tasks assessed           Communication Communication Communication: No difficulties   Cognition Arousal/Alertness: Awake/alert Behavior During Therapy: WFL for tasks assessed/performed Overall Cognitive Status: Within Functional Limits for tasks assessed                     General Comments       Exercises  Shoulder Instructions      Home Living Family/patient expects to be discharged to:: Unsure Living Arrangements: Spouse/significant other                                      Prior Functioning/Environment Level of Independence: Needs assistance        Comments: husband has been assisting her walking to bathroom and LB adls    OT Diagnosis: Acute pain;Generalized weakness   OT Problem List: Decreased strength;Decreased activity tolerance;Decreased knowledge of use of DME or AE;Decreased knowledge of precautions;Cardiopulmonary status limiting activity;Pain;Impaired balance (sitting and/or standing)   OT Treatment/Interventions: Self-care/ADL training;DME and/or AE instruction;Patient/family education;Balance training;Therapeutic activities    OT Goals(Current goals can be found in the  care plan section) Acute Rehab OT Goals Patient Stated Goal: decreased pain; get strength bacl OT Goal Formulation: With patient Time For Goal Achievement: 03/31/15 Potential to Achieve Goals: Good ADL Goals Pt Will Transfer to Toilet: with max assist;with +2 assist;bedside commode;stand pivot transfer Additional ADL Goal #1: pt will go from sit to stand with mod A +2 for safety and maintain for 2 minutes for ADLs with min guard A Additional ADL Goal #2: Pt will sit eob and perform UB adls with set up  OT Frequency: Min 2X/week   Barriers to D/C:            Co-evaluation PT/OT/SLP Co-Evaluation/Treatment: Yes Reason for Co-Treatment: For patient/therapist safety PT goals addressed during session: Mobility/safety with mobility OT goals addressed during session: ADL's and self-care      End of Session    Activity Tolerance: Patient tolerated treatment well Patient left: in bed;with call bell/phone within reach;with bed alarm set   Time: 0981-1914 OT Time Calculation (min): 20 min Charges:  OT General Charges $OT Visit: 1 Procedure OT Evaluation $OT Eval Moderate Complexity: 1 Procedure G-Codes:    Stephanie Littman 2015-04-22, 11:39 AM Marica Otter, OTR/L [phone] 04-22-2015

## 2015-03-24 NOTE — Evaluation (Signed)
Physical Therapy Evaluation Patient Details Name: Debra Johnston MRN: [id number] DOB: [birth year] Today's Date: 03/24/2015   History of Present Illness  This 82 year old female was admitted R hip fx and is s/p ORIF.  She had been having severe R LE pain for 2 weeks prior to admission.  Pt has a PMH significant for HTN, A-Fib, CHF, and uses home 02  Clinical Impression  Patient did tolerate  Sitting on the edge of the bed with 2 persons. Patient will benefit from PT to address problems listed in the note below. Plans for SNF  PTA.    Follow Up Recommendations SNF;Supervision/Assistance - 24 hour    Equipment Recommendations       Recommendations for Other Services       Precautions / Restrictions Precautions Precautions: Fall Restrictions Weight Bearing Restrictions: Yes RLE Weight Bearing: Partial weight bearing Other Position/Activity Restrictions: PWB      Mobility  Bed Mobility Overal bed mobility: Needs Assistance Bed Mobility: Supine to Sit;Sit to Supine     Supine to sit: Max assist;+2 for physical assistance Sit to supine: Total assist;+2 for physical assistance   General bed mobility comments: assist for LEs and trunk to sit up.  total A to lie down due to dizziness  Transfers                 General transfer comment: not attempted  Ambulation/Gait                Stairs            Wheelchair Mobility    Modified Rankin (Stroke Patients Only)       Balance Overall balance assessment: Needs assistance;History of Falls     Sitting balance - Comments: min guard for safety; pt had dizziness                                     Pertinent Vitals/Pain Pain Assessment: Faces Faces Pain Scale: Hurts even more Pain Location: R leg Pain Intervention(s): Limited activity within patient's tolerance;Monitored during session;Repositioned;Ice applied    Home Living Family/patient expects to be discharged to:: Unsure Living  Arrangements: Spouse/significant other                    Prior Function Level of Independence: Needs assistance         Comments: husband has been assisting her walking to bathroom and LB adls     Hand Dominance        Extremity/Trunk Assessment   Upper Extremity Assessment: Overall WFL for tasks assessed           Lower Extremity Assessment: RLE deficits/detail RLE Deficits / Details: requires assist for moving to edge and back onto the bed.    Cervical / Trunk Assessment: Normal  Communication   Communication: No difficulties  Cognition Arousal/Alertness: Awake/alert Behavior During Therapy: WFL for tasks assessed/performed Overall Cognitive Status: Within Functional Limits for tasks assessed                      General Comments      Exercises        Assessment/Plan    PT Assessment Patient needs continued PT services  PT Diagnosis Difficulty walking;Acute pain   PT Problem List Decreased strength;Decreased range of motion;Decreased activity tolerance;Decreased mobility;Decreased knowledge of precautions;Decreased safety awareness;Decreased knowledge of use of DME  PT Treatment Interventions DME instruction;Gait training;Functional mobility training;Therapeutic activities;Therapeutic exercise;Patient/family education   PT Goals (Current goals can be found in the Care Plan section) Acute Rehab PT Goals Patient Stated Goal: decreased pain; get strength bacl PT Goal Formulation: With patient Time For Goal Achievement: 04/07/15 Potential to Achieve Goals: Good    Frequency Min 3X/week   Barriers to discharge Decreased caregiver support      Co-evaluation PT/OT/SLP Co-Evaluation/Treatment: Yes Reason for Co-Treatment: For patient/therapist safety PT goals addressed during session: Mobility/safety with mobility OT goals addressed during session: ADL's and self-care       End of Session   Activity Tolerance: Patient tolerated  treatment well Patient left: in bed;with call bell/phone within reach;with bed alarm set Nurse Communication: Mobility status         Time: 7829-5621 PT Time Calculation (min) (ACUTE ONLY): 20 min   Charges:   PT Evaluation $PT Eval Moderate Complexity: 1 Procedure     PT G CodesRada Hay 03/24/2015, 1:41 PM Blanchard Kelch PT [phone]

## 2015-03-24 NOTE — Clinical Social Work Placement (Signed)
   CLINICAL SOCIAL WORK PLACEMENT  NOTE  Date:  03/24/2015  Patient Details  Name: Debra Johnston MRN: [id number] Date of Birth: 1927/03/26  Clinical Social Work is seeking post-discharge placement for this patient at the Skilled  Nursing Facility level of care (*CSW will initial, date and re-position this form in  chart as items are completed):  No   Patient/family provided with [facility] Clinical Social Work Department's list of facilities offering this level of care within the geographic area requested by the patient (or if unable, by the patient's family).  Yes   Patient/family informed of their freedom to choose among providers that offer the needed level of care, that participate in Medicare, Medicaid or managed care program needed by the patient, have an available bed and are willing to accept the patient.  No   Patient/family informed of [facility] ownership interest in [facility] and [facility], as well as of the fact that they are under no obligation to receive care at these facilities.  PASRR submitted to EDS on       PASRR number received on       Existing PASRR number confirmed on 03/24/15     FL2 transmitted to all facilities in geographic area requested by pt/family on 03/24/15     FL2 transmitted to all facilities within larger geographic area on       Patient informed that his/her managed care company has contracts with or will negotiate with certain facilities, including the following:            Patient/family informed of bed offers received.  Patient chooses bed at       Physician recommends and patient chooses bed at      Patient to be transferred to   on  .  Patient to be transferred to facility by       Patient family notified on   of transfer.  Name of family member notified:        PHYSICIAN       Additional Comment:    _______________________________________________ Royetta Asal, LCSW  [phone] 03/24/2015, 2:51 PM

## 2015-03-24 NOTE — Progress Notes (Signed)
Patient Name: Debra Johnston Date of Encounter: 03/24/2015  Principal Problem:   Fracture, subtrochanteric, right femur, closed (HCC) Active Problems:   Chronic diastolic CHF (congestive heart failure) (HCC)   PAF (paroxysmal atrial fibrillation) (HCC)   HTN (hypertension)   CKD (chronic kidney disease) stage 3, GFR 30-59 ml/min   Oxygen dependent   Femur fracture, right (HCC)   Fever   CAP (community acquired pneumonia)   Preoperative cardiovascular examination   Acute on chronic diastolic CHF (congestive heart failure), NYHA class 3 (HCC)   Primary Cardiologist: Dr [place]  Patient Profile: 65F with hypertension, paroxysmal atrial fibrillation and chronic diastolic heart failure on home O2 here 01/31 with R hip fracture. S/p nailing 02/01.  SUBJECTIVE: Breathing a little better today, home O2 is 2 lpm. No sleep because of hip pain. Eating today and doing OK w/ it.  OBJECTIVE Filed Vitals:   03/24/15 0445 03/24/15 0500 03/24/15 0515 03/24/15 0530  BP:  133/46  143/36  Pulse: 82 78 79 76  Temp:      TempSrc:      Resp: Height:      Weight:      SpO2:  93%  95%    Intake/Output Summary (Last 24 hours) at 03/24/15 0817 Last data filed at 03/24/15 0700  Gross per 24 hour  Intake 1328.33 ml  Output   1885 ml  Net -556.67 ml   Filed Weights   03/23/15 0200 03/23/15 2100  Weight: 260 lb 12.9 oz (118.3 kg) 253 lb 15.5 oz (115.2 kg)    PHYSICAL EXAM General: Well developed, well nourished, female in no acute distress. Head: Normocephalic, atraumatic.  Neck: Supple without bruits, JVD 9 cm. Lungs:  Resp regular and unlabored, rales bases. Heart: RRR, S1, S2, no S3, S4, or murmur; no rub. Abdomen: Soft, non-tender, non-distended, BS + x 4.  Extremities: No clubbing, cyanosis, edema. Distal pulses intact Neuro: Alert and oriented X 3. Moves all extremities spontaneously. Psych: Normal affect.  LABS: CBC:  Recent Labs  03/21/15 2340  03/24/15 0323  WBC 7.8 12.5*  NEUTROABS 4.4 10.2*  HGB 12.0 10.2*  HCT 38.6 31.9*  MCV 96.5 95.8  PLT 291 275   INR:  Recent Labs  03/22/15 0042  INR 1.08   Basic Metabolic Panel:  Recent Labs  16/10/96 2340 03/24/15 0323  NA 142 135  K 4.1 4.3  CL 105 100*  CO2 23 26  GLUCOSE 131* 161*  BUN 11 18  CREATININE 1.06* 1.07*  CALCIUM 10.3 8.9   Liver Function Tests:  Recent Labs  03/24/15 0323  AST 28  ALT 21  ALKPHOS 46  BILITOT 0.9  PROT 5.8*  ALBUMIN 3.0*   BNP:  B NATRIURETIC PEPTIDE  Date/Time Value Ref Range Status  03/16/2015 12:51 PM 149.3* 0.0 - 100.0 pg/mL Final   Thyroid Function Tests:  Recent Labs  03/23/15 1057  TSH 2.617   TELE:  SR, no sig ectopy, no arrhythmia    Radiology/Studies: Dg C-arm 61-120 Min-no Report  03/23/2015  CLINICAL DATA: surgery C-ARM 61-120 MINUTES Fluoroscopy was utilized by the requesting physician.  No radiographic interpretation.   Dg Femur, Min 2 Views Right  03/23/2015  CLINICAL DATA:  Right femoral IM nail. EXAM: RIGHT FEMUR 2 VIEWS; DG C-ARM 61-120 MIN-NO REPORT COMPARISON:  Preoperative radiographs 03/21/2015 FINDINGS: Four fluoroscopic spot images post IM nail insertion demonstrates intra medullary nail in the right femur with proximal and distal  locking screws. A trans trochanteric screw is seen. Improved fracture alignment compared to preoperative exam. Fluoroscopy time reported 3 minutes 59 seconds. Exposure 61.94 mGy. IMPRESSION: Fluoroscopic spot images post IM nail insertion fixating subtrochanteric right femur fracture. Improved fracture alignment. Electronically Signed   By: Rubye Oaks M.D.   On: 03/23/2015 18:39     Current Medications:  . amLODipine  5 mg Oral Daily  . aspirin EC  325 mg Oral Q breakfast  . carvedilol  25 mg Oral BID WC  . cholecalciferol  1,000 Units Oral Daily  . docusate sodium  100 mg Oral BID  . dorzolamide  1 drop Both Eyes TID  . enoxaparin (LOVENOX) injection  40  mg Subcutaneous Daily  . ferrous sulfate  325 mg Oral TID PC  . FLUoxetine  30 mg Oral Daily  . furosemide  40 mg Intravenous BID  . Influenza vac split quadrivalent PF  0.5 mL Intramuscular Tomorrow-1000  . insulin aspart  0-15 Units Subcutaneous 6 times per day  . levofloxacin (LEVAQUIN) IV  750 mg Intravenous Q24H  . levothyroxine  88 mcg Oral QAC breakfast  . magnesium oxide  800 mg Oral Daily  . multivitamin with minerals  1 tablet Oral Daily  . OLANZapine  5 mg Oral Daily  . pantoprazole  40 mg Oral Daily  . rosuvastatin  5 mg Oral Daily   . sodium chloride 50 mL/hr at 03/23/15 2114    ASSESSMENT AND PLAN: # Acute on Chronic diastolic heart failure, Shortness of breath, O2: appears stable. - Echo 01/31 Stable systolic function, EF 55-60% and continued moderate concentric hypertrophy- unable to eval LV diastolic function. PAS 19, CVP 3 - Stopped IV fluids - Lasix 40mg  IV bid - should be able to change to PO in am - Continue carvedilol - Daily BMet while on IV Lasix  # Paroxysmal Atrial fibrillation: Currently in sinus rhythm.  - Not an anticoagulation candidate due to falls.  - This patients CHA2DS2-VASc Score and unadjusted Ischemic Stroke Rate (% per year) is equal to 4.8 % stroke rate/year from a score of 4 Above score calculated as 1 point each if present [CHF, HTN, DM, Vascular=MI/PAD/Aortic Plaque, Age if 65-74, or Female] Above score calculated as 2 points each if present [Age > 75, or Stroke/TIA/TE]  # Hypertensive heart disease: Blood pressure initially poorly controlled.  - Her blood pressure may also have been elevated due to pain.  - Continue carvedilol and amlodipine. -On increased Lasix as above.  - BP control continues to improve, 143/36  # Hyperlipidemia: Continue rosuvastatin.  # Fractured Rt Hip s/p IM nailing.  Otherwise, per IM. OK w/ cards to tx telemetry. Principal Problem:   Fracture, subtrochanteric, right femur, closed (HCC) Active  Problems:   Chronic diastolic CHF (congestive heart failure) (HCC)   PAF (paroxysmal atrial fibrillation) (HCC)   HTN (hypertension)   CKD (chronic kidney disease) stage 3, GFR 30-59 ml/min   Oxygen dependent   Femur fracture, right (HCC)   Fever   CAP (community acquired pneumonia)   Preoperative cardiovascular examination   Acute on chronic diastolic CHF (congestive heart failure), NYHA class 3 (HCC)   Signed, Barrett, Rhonda , PA-C 8:17 AM 03/24/2015  I have examined the patient and reviewed assessment and plan and discussed with patient.  Agree with above as stated.  Appears to be in NSR.  Did well with hip surgery.  Persistent right shoulder pain as well.  Continue BP control.  Will sign off .  Taquila Leys S.

## 2015-03-24 NOTE — Care Management Note (Signed)
Case Management Note  Patient Details  Name: LAFREDA CASEBEER MRN: [id number] Date of Birth: 01-25-1928  Subjective/Objective:                  INTRAMEDULLARY (IM) NAIL FEMORAL (Right) Action/Plan: Discharge planning Expected Discharge Date:                  Expected Discharge Plan:  [place]  In-House Referral:     Discharge planning Services  CM Consult  Post Acute Care Choice:    Choice offered to:     DME Arranged:    DME Agency:     HH Arranged:    [place] Agency:     Status of Service:     Medicare Important Message Given:    Date Medicare IM Given:    Medicare IM give by:    Date Additional Medicare IM Given:    Additional Medicare Important Message give by:     If discussed at [place] of Stay Meetings, dates discussed:    Additional Comments: CM met with pt and spouse who both state they are from independent living at [facility]; spouse states pt will be going to skilled rehab unit of [facility]  CSW aware and arranging.  No other CM needs were communicated. Dellie Catholic, RN 03/24/2015, 1:53 PM

## 2015-03-24 NOTE — Op Note (Signed)
NAME:  Debra Johnston, Debra Johnston               ACCOUNT NO.:  [id number]  MEDICAL RECORD NO.:  [id number]  LOCATION:  1604                         FACILITY:  [facility]  PHYSICIAN:  Madlyn Frankel. Charlann Boxer, M.D.  DATE OF BIRTH:  06-May-1927  DATE OF PROCEDURE:  03/23/2015 DATE OF DISCHARGE:                              OPERATIVE REPORT   PREOPERATIVE DIAGNOSIS:  Right subtrochanteric femur fracture with transverse pattern.  POSTOPERATIVE DIAGNOSIS:  Right subtrochanteric femur fracture with transverse pattern.  PROCEDURE:  Open reduction and internal fixation, right subtrochanteric femur fracture utilizing a Biomet VersaNail 11 x 380 mm with a criss- cross pattern proximally to single distal interlock.  SURGEON:  Madlyn Frankel. Charlann Boxer, M.D.  ASSISTANT:  Surgical team.  ANESTHESIA:  General.  BLOOD LOSS:  350 mL.  COMPLICATIONS:  None.  DRAINS:  None.  INDICATION OF PROCEDURE:  Debra Johnston is an 82 year old female, who presented to the hospital on January 30 after a fall.  There was a limited trauma per the patient's report.  She had been seen and evaluated and was transferred from [facility] to [facility] for surgical clearance.  She was initially seen and evaluated by Dr. Paula Libra who asked for my assistance in management based on time and call schedule.  She was noted to have a displaced subtrochanteric femur fracture.  Risks and benefits have been discussed and consent was obtained for benefit of fracture management.  The standard risks of nonunion, malunion, discussed in addition to infection and DVT.  PROCEDURE IN DETAIL:  The patient was brought to operative theater. Once adequate anesthesia, preop antibiotics, vancomycin administered. She was positioned supine on the fracture table and left unaffected. Extremity was padded and placed into a leg holder in a flexed and abducted position.  The padded perineal post was applied.  Under fluoroscopic imaging, I evaluated the fracture pattern  with traction.  At this point, the right hip was prepped and draped in sterile fashion. A time-out was performed identifying the patient, planned procedure, and extremity.  Fluoroscopy was brought back to the field, landmarks were identified.  Based on the subtrochanteric nature of the fracture and the history of having to deal with this, I elected to release, demarcate, and utilize an incision that extended from the proximal aspect of the femur over to the area of the fracture site.  Initially focused on the greater trochanter and a guidewire was inserted into the tip of the trochanter being confirmed radiographically in the AP and lateral planes.  Once I was satisfied with this, I drilled the proximal femur.  This allowed for insertion of a reduction tool to help with reduction.  I was unable to reduce this in a closed fashion.  This extended my incision distally, so I had exposed the fracture site.  With the fracture site exposed, I was able to manually reduce the fracture and confirmed this radiographically as I was doing this.  I then was able to pass the guidewire reduction facilitation tool across the fracture point and confirmed radiographically.  Once this was done, I passed the ball-tipped guidewire to the knee where she had a previous total knee arthroplasty.  I measured the depth and  selected a 380 mm nail.  With the guidewire placed, the reduction tool was removed.  I then began reaming and reamed up to a 12.5 mm reamer in 0.5 mm increments.  Once this was done, we selected the 11 x 380 mm nail and then passed it by hand across the fracture site again manually maintaining the best reduction of possible it could and recognizing some of the challenge and restrictions of the deforming forces of the proximal femur.  Once I was satisfied with the near anatomic reduction, I placed 2 proximal screws into the femoral neck and then from the greater to the lesser trochanter.   With the proximal femur locked in, I then placed a distal interlock under perfect circle technique.  Upon completion of the intramedullary nailing of the femur, the wounds were irrigated with normal saline solution.  The distal interlock was closed with staples.  The proximal wound was then closed in layers with #1 Vicryl and V-Loc sutures on the iliotibial band and gluteal fascia. I then reapproximated the subcu layer with 2-0 Vicryl and staples on the skin.  The skin was then cleaned, dried, and dressed sterilely using an Aquacel dressing.  Upon completion of the procedure, she was brought to the recovery room in stable condition tolerating the procedure well. She was initially transferred to the intensive care unit for step-down purposes based on her medical comorbidities.  Findings were reviewed with family.  At this point, she will be partial weightbearing on this right lower extremity to allow for adequate healing.  We will follow her in the office on a routine basis.     Madlyn Frankel Charlann Boxer, M.D.     MDO/MEDQ  D:  03/24/2015  T:  03/24/2015  Job:  [id number]

## 2015-03-25 DIAGNOSIS — N179 Acute kidney failure, unspecified: Secondary | ICD-10-CM | POA: Diagnosis not present

## 2015-03-25 LAB — COMPREHENSIVE METABOLIC PANEL
ALK PHOS: 45 U/L (ref 38–126)
ALT: 22 U/L (ref 14–54)
AST: 34 U/L (ref 15–41)
Albumin: 2.9 g/dL — ABNORMAL LOW (ref 3.5–5.0)
Anion gap: 10 (ref 5–15)
BUN: 32 mg/dL — AB (ref 6–20)
CALCIUM: 9.2 mg/dL (ref 8.9–10.3)
CO2: 28 mmol/L (ref 22–32)
CREATININE: 1.31 mg/dL — AB (ref 0.44–1.00)
Chloride: 100 mmol/L — ABNORMAL LOW (ref 101–111)
GFR calc non Af Amer: 36 mL/min — ABNORMAL LOW (ref >60)
GFR, EST AFRICAN AMERICAN: 42 mL/min — AB (ref >60)
Glucose, Bld: 165 mg/dL — ABNORMAL HIGH (ref 65–99)
Potassium: 4.2 mmol/L (ref 3.5–5.1)
SODIUM: 138 mmol/L (ref 135–145)
Total Bilirubin: 0.5 mg/dL (ref 0.3–1.2)
Total Protein: 5.9 g/dL — ABNORMAL LOW (ref 6.5–8.1)

## 2015-03-25 LAB — CBC WITH DIFFERENTIAL/PLATELET
Basophils Absolute: 0 10*3/uL (ref 0.0–0.1)
Basophils Relative: 0 %
EOS ABS: 0 10*3/uL (ref 0.0–0.7)
Eosinophils Relative: 0 %
HCT: 28 % — ABNORMAL LOW (ref 36.0–46.0)
HEMOGLOBIN: 9.1 g/dL — AB (ref 12.0–15.0)
LYMPHS ABS: 1.2 10*3/uL (ref 0.7–4.0)
LYMPHS PCT: 8 %
MCH: 30.8 pg (ref 26.0–34.0)
MCHC: 32.5 g/dL (ref 30.0–36.0)
MCV: 94.9 fL (ref 78.0–100.0)
Monocytes Absolute: 1.6 10*3/uL — ABNORMAL HIGH (ref 0.1–1.0)
Monocytes Relative: 11 %
NEUTROS PCT: 81 %
Neutro Abs: 11.9 10*3/uL — ABNORMAL HIGH (ref 1.7–7.7)
Platelets: 292 10*3/uL (ref 150–400)
RBC: 2.95 MIL/uL — AB (ref 3.87–5.11)
RDW: 14.5 % (ref 11.5–15.5)
WBC: 14.7 10*3/uL — AB (ref 4.0–10.5)

## 2015-03-25 LAB — GLUCOSE, CAPILLARY
GLUCOSE-CAPILLARY: 152 mg/dL — AB (ref 65–99)
GLUCOSE-CAPILLARY: 139 mg/dL — AB (ref 65–99)
GLUCOSE-CAPILLARY: 139 mg/dL — AB (ref 65–99)
Glucose-Capillary: 139 mg/dL — ABNORMAL HIGH (ref 65–99)

## 2015-03-25 MED ORDER — ALBUTEROL SULFATE (2.5 MG/3ML) 0.083% IN NEBU
2.5000 mg | INHALATION_SOLUTION | RESPIRATORY_TRACT | Status: DC | PRN
  Administered 2015-03-28: 2.5 mg via RESPIRATORY_TRACT
  Filled 2015-03-25: qty 3

## 2015-03-25 MED ORDER — LEVOFLOXACIN IN D5W 750 MG/150ML IV SOLN
750.0000 mg | INTRAVENOUS | Status: DC
  Filled 2015-03-25: qty 150

## 2015-03-25 MED ORDER — GUAIFENESIN ER 600 MG PO TB12
1200.0000 mg | ORAL_TABLET | Freq: Two times a day (BID) | ORAL | Status: DC
  Administered 2015-03-25 – 2015-04-01 (×15): 1200 mg via ORAL
  Filled 2015-03-25 (×18): qty 2

## 2015-03-25 MED ORDER — SODIUM CHLORIDE 0.45 % IV SOLN
INTRAVENOUS | Status: DC
  Administered 2015-03-25: 12:00:00 via INTRAVENOUS

## 2015-03-25 MED ORDER — METRONIDAZOLE IN NACL 5-0.79 MG/ML-% IV SOLN
500.0000 mg | Freq: Three times a day (TID) | INTRAVENOUS | Status: DC
  Administered 2015-03-25 – 2015-03-29 (×13): 500 mg via INTRAVENOUS
  Filled 2015-03-25 (×14): qty 100

## 2015-03-25 MED ORDER — FUROSEMIDE 40 MG PO TABS
40.0000 mg | ORAL_TABLET | Freq: Every day | ORAL | Status: DC
  Administered 2015-03-26 – 2015-04-01 (×7): 40 mg via ORAL
  Filled 2015-03-25 (×7): qty 1

## 2015-03-25 MED ORDER — SODIUM CHLORIDE 0.9 % IV BOLUS (SEPSIS)
250.0000 mL | Freq: Once | INTRAVENOUS | Status: AC
  Administered 2015-03-25: 250 mL via INTRAVENOUS

## 2015-03-25 MED ORDER — PREDNISONE 20 MG PO TABS
30.0000 mg | ORAL_TABLET | Freq: Every day | ORAL | Status: DC
  Administered 2015-03-25 – 2015-03-27 (×3): 30 mg via ORAL
  Filled 2015-03-25 (×4): qty 1

## 2015-03-25 NOTE — Care Management Important Message (Signed)
Important Message  Patient Details  Name: Debra Johnston MRN: [id number] Date of Birth: August 15, 1927   Medicare Important Message Given:  Yes    Haskell Flirt 03/25/2015, 10:39 AMImportant Message  Patient Details  Name: Debra Johnston MRN: [id number] Date of Birth: 1927/03/18   Medicare Important Message Given:  Yes    Haskell Flirt 03/25/2015, 10:39 AM

## 2015-03-25 NOTE — Progress Notes (Signed)
     Subjective: 2 Days Post-Op Procedure(s) (LRB): INTRAMEDULLARY (IM) NAIL FEMORAL (Right)   Patient reports pain as mild, pain controlled. No events throughout the night. Orthopaedically stable.  Objective:   VITALS:   Filed Vitals:   03/25/15 0221 03/25/15 0555  BP: 120/69 131/53  Pulse: 76 78  Temp: 97.7 F (36.5 C) 98.3 F (36.8 C)  Resp: 18 18    Dorsiflexion/Plantar flexion intact Incision: dressing C/D/I No cellulitis present Compartment soft  LABS  Recent Labs  03/24/15 0323 03/25/15 0423  HGB 10.2* 9.1*  HCT 31.9* 28.0*  WBC 12.5* 14.7*  PLT 275 292     Recent Labs  03/24/15 0323 03/25/15 0423  NA 135 138  K 4.3 4.2  BUN 18 32*  CREATININE 1.07* 1.31*  GLUCOSE 161* 165*     Assessment/Plan: 2 Days Post-Op Procedure(s) (LRB): INTRAMEDULLARY (IM) NAIL FEMORAL (Right)   Up with therapy Discharge to SNF when ready medically, orthopaedically stable   Ortho recommendations:  ASA 325 mg bid for 4 weeks for anticoagulation, unless other medically indicated.  Norco for pain management (Rx written).  MiraLax and Colace for constipation  Iron 325 mg tid for 2-3 weeks   PWB 50%on the right leg.  Dressing to remain in place until follow in clinic in 2 weeks.  Dressing is waterproof and may shower with it in place.  Follow up in 2 weeks at [facility]. Follow up with OLIN,Allora Bains D in 2 weeks.  Contact information:  [facility] [address], [address] [place] [address] [phone]           Anastasio Auerbach. Rosaleah Person   PAC  03/25/2015, 8:27 AM

## 2015-03-25 NOTE — Progress Notes (Signed)
Physical Therapy Treatment Patient Details Name: Debra Johnston MRN: [id number] DOB: 1928/02/15 Today's Date: 03/25/2015    History of Present Illness This 82 year old female was admitted R hip fx and is s/p ORIF.  She had been having severe R LE pain for 2 weeks prior to admission.  Pt has a PMH significant for HTN, A-Fib, CHF, and uses home 02, underlying pneumonia most likely.    PT Comments    Medication given 1 hour prior to mobility. Requires 2 person assist for mobilizing to sitting, stood x 1 from raised bed at platform RW with 2 assist. Unable  To attempt any steps to pivot to recliner. Will need lift today. Patient does  Give some effort and is participary. Is confused today about where she is. Spouse in room.   Follow Up Recommendations  SNF;Supervision/Assistance - 24 hour     Equipment Recommendations  None recommended by PT    Recommendations for Other Services       Precautions / Restrictions Precautions Precautions: Fall Restrictions RLE Weight Bearing: Partial weight bearing RLE Partial Weight Bearing Percentage or Pounds: none stated    Mobility  Bed Mobility Overal bed mobility: Needs Assistance Bed Mobility: Rolling Rolling: Max assist;+2 for safety/equipment;+2 for physical assistance   Supine to sit: +2 for safety/equipment;+2 for physical assistance;Max assist;HOB elevated Sit to supine: Total assist;+2 for safety/equipment;+2 for physical assistance   General bed mobility comments: Assist to flex L leg to facilitae a roll, partial roll to the Right. Patient made attempts to move the Left leg and used left rail to assist trunk to upright but required 2 assist to fully get to upright.  total A to lie down. Patient c/o feeling weak.  Transfers Overall transfer level: Needs assistance Equipment used: Bilateral platform walker Transfers: Sit to/from Stand Sit to Stand: Total assist;+2 physical assistance;+2 safety/equipment;From elevated surface          General transfer comment: bed raised to near standing position with patient holding onto the platform of the EVA walker. trunk flexed, bed lowered and pat stood for about 30 seconds then lowered self to sitting quickly. Unable to attempt any small steps today.   Ambulation/Gait                 Stairs            Wheelchair Mobility    Modified Rankin (Stroke Patients Only)       Balance Overall balance assessment: History of Falls;Needs assistance Sitting-balance support: Bilateral upper extremity supported;Feet supported   Sitting balance - Comments: min guard, sits at midline                            Cognition Arousal/Alertness: Awake/alert Behavior During Therapy: Anxious Overall Cognitive Status: Impaired/Different from baseline Area of Impairment: Orientation Orientation Level: Time;Situation;Place             General Comments: was thinking she was going home today.    Exercises      General Comments        Pertinent Vitals/Pain Faces Pain Scale: Hurts whole lot Pain Location: R leg/thigh Pain Intervention(s): Limited activity within patient's tolerance;Monitored during session;Premedicated before session;Repositioned;Ice applied    Home Living                      Prior Function            PT Goals (current goals  can now be found in the care plan section) Progress towards PT goals: Progressing toward goals    Frequency  Min 3X/week    PT Plan Current plan remains appropriate    Co-evaluation             End of Session Equipment Utilized During Treatment: Gait belt Activity Tolerance: Patient limited by fatigue;Patient limited by pain Patient left: in bed;with call bell/phone within reach;with bed alarm set;with family/visitor present     Time: 1610-9604 PT Time Calculation (min) (ACUTE ONLY): 28 min  Charges:  $Therapeutic Activity: 23-37 mins                    G Codes:      Rada Hay 03/25/2015, 12:07 PM Blanchard Kelch PT [phone]

## 2015-03-25 NOTE — Progress Notes (Signed)
Debra Johnston FAO:[id number] DOB: [birth year] DOA: 03/21/2015 PCP: Debra Kail, MD  Assessment at time of admission on 03/22/15 by Dr Debra Johnston is a 82 y.o. female with PMH of hypertension, hypothyroidism, paroxysmal atrial fibrillation, and chronic diastolic CHF with home O2 requirement who presents to the ED with acute onset right leg pain. Patient has history of chronic pain secondary to osteoarthritis and is status post a right total knee replacement. She had been complaining of severe proximal right leg pain for approximately 2 weeks now and had been evaluated by her physician for this complaint. She was ruled out for DVT and no pathology was identified to explain her pain. She has been ambulating with an increasing difficulty over the past 2 weeks due to the leg pain. Tonight, as her husband was helping her to walk to the bathroom, she heard a loud "pop" with acute worsening of her proximal right leg pain.  In ED, patient was found to be afebrile, saturating well on 2 Lpm, and with vital signs stable. The right leg was noted to be shortened and externally rotated and radiographs confirmed a displaced subtrochanteric proximal right femur fracture. Dr. Darrelyn Johnston of orthopedic surgery was consulted from the emergency department and recommended admission to the hospitalist service. Patient will be admitted for ongoing evaluation and management of proximal femur fracture. Daily Progress Notes since admission 03/23/15: Appreciate cardiology/orthopedics. Patient has low-grade fever and continues to have a hacking cough which started prior to right leg pains. She likely has underlying pneumonia. We will therefore obtain CT chest with contrast, blood cultures and start Levaquin. She seems to be dry. 2-D echocardiogram showed normal systolic function. Discussed Plan with patient's family at the bedside including her daughter-in-law. 03/24/15: Appreciate orthopedics/cardiology. Status post ORIF on 03/23/15.  White count has increased to 12,500. No fever. Leukocytosis probably has an element of leukaemoid reaction to it. CT chest done this morning but results pending. Will continue current management including antibiotics(levaquin), diuretics per cardiology and transferred to orthopedics unit later today when bed becomes available. Patient did okay overnight with no hypoxia or hypotension. She feels weak. 03/25/15: Appreciate orthopedics/cardiology. Increasing leukocytosis concerning- ?steroid element, so is slight bump in creatinine 12.500>14.500(received dose of Dexamethasone intraoperatively),,1`8/1.07>32/1.32(Diuretics/antibiotics/iv contrast- for CT chest), CT chest with contrast showed "Subtle region of reticulonodular opacification over the posterior lateral right middle lobe which may be acute or chronic. This could be seen in an infectious/atypical infectious or inflammatory process. Recommend followup CT 4-6 weeks. Faint 3-4 mm subpleural nodule over the lateral right upper lobe. Recommend followup CT 1 year. This recommendation follows the consensus statement: Guidelines for Management of Small Pulmonary Nodules Detected on CT Scans: A Statement from the Fleischner Society as published in Radiology 2005; 237:395-400. Online at: DietDisorder.cz. Mild cardiomegaly.  Minimal atherosclerotic coronary artery disease. Cholelithiasis". Will change Lasix to oral, give gentle fluids in view of worsening renal function, add Flagyl as possibility of aspiration pneumonia, give short course of steroids/albuterol/Mucinex as patient complaining of cough and shortness of breath. She will likely be here for the weekend. She says that her relatives including husband have been sick with a cold at home. Problem List Plan  Principal Problem:   Fracture, subtrochanteric, right femur, closed (HCC) Active Problems:   Chronic diastolic CHF (congestive heart failure) (HCC)   PAF (paroxysmal  atrial fibrillation) (HCC)   HTN (hypertension)   CKD (chronic kidney disease) stage 3, GFR 30-59 ml/min   Oxygen dependent   Femur fracture, right (HCC)  Fever   CAP (community acquired pneumonia)   Preoperative cardiovascular examination   Acute on chronic diastolic CHF (congestive heart failure), NYHA class 3 (HCC)   Leucocytosis   AKI (acute kidney injury) (HCC)   Change Lasix to home dose  Gentle fluids  Low dose Prednisone/albuterol/Mucinex   Flagyl   Day 3 Levaquin  Continue physical therapy  Follow orthopedics/cardiology recommendations   Code Status: Full Code Family Communication: No Family at bedside, called husband at [phone], and could not leave message Disposition Plan: Eventually SNF- Early next week Consultants:  Orthopedics  Cardiology Procedures:  IM Nail right femur neck Antibiotics:  Levaquin 03/23/15>>  Flagyl 03/25/15>>  HPI/Subjective: Feels better but complains of cough and pain at surgical site. She tells me that family members including her husband/son/daughter-in-law have been sick with a cold in recent days.  Objective: Filed Vitals:   03/25/15 0221 03/25/15 0555  BP: 120/69 131/53  Pulse: 76 78  Temp: 97.7 F (36.5 C) 98.3 F (36.8 C)  Resp: 18 18    Intake/Output Summary (Last 24 hours) at 03/25/15 0907 Last data filed at 03/25/15 0556  Gross per 24 hour  Intake    240 ml  Output   2225 ml  Net  -1985 ml   Filed Weights   03/23/15 0200 03/23/15 2100  Weight: 118.3 kg (260 lb 12.9 oz) 115.2 kg (253 lb 15.5 oz)    Exam:   General:  Comfortable at rest.  Cardiovascular: S1-S2 normal. No murmurs. Pulse regular.  Respiratory: Good air entry bilaterally. Bilateral rhonchi with scant wheezing.  Abdomen: Soft and nontender. Normal bowel sounds. No organomegaly.  Musculoskeletal: No pedal edema. No bleeding/drainage from surgical site right hip.  Neurological: Intact  Data Reviewed: Basic Metabolic  Panel:  Recent Labs Lab 03/21/15 2340 03/24/15 0323 03/25/15 0423  NA 142 135 138  K 4.1 4.3 4.2  CL 105 100* 100*  CO2 23 26 28   GLUCOSE 131* 161* 165*  BUN 11 18 32*  CREATININE 1.06* 1.07* 1.31*  CALCIUM 10.3 8.9 9.2   Liver Function Tests:  Recent Labs Lab 03/24/15 0323 03/25/15 0423  AST 28 34  ALT 21 22  ALKPHOS 46 45  BILITOT 0.9 0.5  PROT 5.8* 5.9*  ALBUMIN 3.0* 2.9*   No results for input(s): LIPASE, AMYLASE in the last 168 hours. No results for input(s): AMMONIA in the last 168 hours. CBC:  Recent Labs Lab 03/21/15 2340 03/24/15 0323 03/25/15 0423  WBC 7.8 12.5* 14.7*  NEUTROABS 4.4 10.2* 11.9*  HGB 12.0 10.2* 9.1*  HCT 38.6 31.9* 28.0*  MCV 96.5 95.8 94.9  PLT 291 275 292   Cardiac Enzymes: No results for input(s): CKTOTAL, CKMB, CKMBINDEX, TROPONINI in the last 168 hours. BNP (last 3 results)  Recent Labs  03/16/15 1251  BNP 149.3*    ProBNP (last 3 results)  Recent Labs  11/19/14 1248  PROBNP 92.0    CBG:  Recent Labs Lab 03/24/15 0749 03/24/15 1212 03/24/15 1644 03/24/15 2149 03/25/15 0715  GLUCAP 153* 143* 135* 161* 152*    Recent Results (from the past 240 hour(s))  Surgical pcr screen     Status: None   Collection Time: 03/23/15  3:19 AM  Result Value Ref Range Status   MRSA, PCR NEGATIVE NEGATIVE Final   Staphylococcus aureus NEGATIVE NEGATIVE Final    Comment:        The Xpert SA Assay (FDA approved for NASAL specimens in patients over 10 years of age), is  one component of a comprehensive surveillance program.  Test performance has been validated by [facility] for patients greater than or equal to 30 year old. It is not intended to diagnose infection nor to guide or monitor treatment.   Culture, blood (routine x 2)     Status: None (Preliminary result)   Collection Time: 03/23/15 11:00 AM  Result Value Ref Range Status   Specimen Description BLOOD RIGHT ARM  Final   Special Requests IN PEDIATRIC  BOTTLE 4 CC  Final   Culture   Final    NO GROWTH 1 DAY Performed at [facility]    Report Status PENDING  Incomplete  Culture, blood (routine x 2)     Status: None (Preliminary result)   Collection Time: 03/23/15 11:05 AM  Result Value Ref Range Status   Specimen Description BLOOD RIGHT HAND  Final   Special Requests BOTTLES DRAWN AEROBIC ONLY 5 CC BLUE  Final   Culture   Final    NO GROWTH 1 DAY Performed at [facility]    Report Status PENDING  Incomplete     Studies: Ct Chest W Contrast  03/24/2015  CLINICAL DATA:  Hypoxia.  Evaluate for pneumonia. EXAM: CT CHEST WITH CONTRAST TECHNIQUE: Multidetector CT imaging of the chest was performed during intravenous contrast administration. CONTRAST:  75mL OMNIPAQUE IOHEXOL 300 MG/ML  SOLN COMPARISON:  Chest x-ray 03/21/2015 FINDINGS: Lungs are well inflated and demonstrate very minimal dependent atelectasis and linear atelectasis versus scarring over the right lower lobe. Subtle focal reticulonodular opacification over the posterior lateral aspect of the right middle lobe which may be acute or chronic. Faint 3-4 mm subpleural nodular density over the lateral right upper lobe. Airways are within normal. Mild cardiomegaly. Minimal calcified plaque over the left anterior descending coronary artery. Mild calcified plaque over the thoracic aorta. No evidence of mediastinal, hilar or axillary adenopathy. Remaining mediastinal structures are within normal. Images through the upper abdomen demonstrate evidence of cholelithiasis. Minimal degenerative change of the spine. IMPRESSION: Subtle region of reticulonodular opacification over the posterior lateral right middle lobe which may be acute or chronic. This could be seen in an infectious/atypical infectious or inflammatory process. Recommend followup CT 4-6 weeks. Faint 3-4 mm subpleural nodule over the lateral right upper lobe. Recommend followup CT 1 year. This recommendation follows the  consensus statement: Guidelines for Management of Small Pulmonary Nodules Detected on CT Scans: A Statement from the Fleischner Society as published in Radiology 2005; 237:395-400. Online at: DietDisorder.cz. Mild cardiomegaly.  Minimal atherosclerotic coronary artery disease. Cholelithiasis. Electronically Signed   By: Elberta Fortis M.D.   On: 03/24/2015 11:36   Dg C-arm 61-120 Min-no Report  03/23/2015  CLINICAL DATA: surgery C-ARM 61-120 MINUTES Fluoroscopy was utilized by the requesting physician.  No radiographic interpretation.   Dg Femur, Min 2 Views Right  03/23/2015  CLINICAL DATA:  Right femoral IM nail. EXAM: RIGHT FEMUR 2 VIEWS; DG C-ARM 61-120 MIN-NO REPORT COMPARISON:  Preoperative radiographs 03/21/2015 FINDINGS: Four fluoroscopic spot images post IM nail insertion demonstrates intra medullary nail in the right femur with proximal and distal locking screws. A trans trochanteric screw is seen. Improved fracture alignment compared to preoperative exam. Fluoroscopy time reported 3 minutes 59 seconds. Exposure 61.94 mGy. IMPRESSION: Fluoroscopic spot images post IM nail insertion fixating subtrochanteric right femur fracture. Improved fracture alignment. Electronically Signed   By: Rubye Oaks M.D.   On: 03/23/2015 18:39    Scheduled Meds: . amLODipine  5 mg Oral  Daily  . aspirin EC  325 mg Oral BID  . carvedilol  25 mg Oral BID WC  . cholecalciferol  1,000 Units Oral Daily  . docusate sodium  100 mg Oral BID  . dorzolamide  1 drop Both Eyes TID  . ferrous sulfate  325 mg Oral TID PC  . FLUoxetine  30 mg Oral Daily  . [START ON 03/26/2015] furosemide  40 mg Oral Daily  . Influenza vac split quadrivalent PF  0.5 mL Intramuscular Tomorrow-1000  . insulin aspart  0-15 Units Subcutaneous TID WC  . [START ON 03/26/2015] levofloxacin (LEVAQUIN) IV  750 mg Intravenous Q48H  . levothyroxine  88 mcg Oral QAC breakfast  . magnesium oxide  800 mg Oral  Daily  . metronidazole  500 mg Intravenous Q8H  . multivitamin with minerals  1 tablet Oral Daily  . OLANZapine  5 mg Oral Daily  . pantoprazole  40 mg Oral Daily  . rosuvastatin  5 mg Oral Daily  . sodium chloride  250 mL Intravenous Once   Continuous Infusions:    Time spent: 25 minutes    Shanine Kreiger  [facility] Pager [phone]. If 7PM-7AM, please contact night-coverage at [email or web address], password [facility] 03/25/2015, 9:07 AM  LOS: 3 days

## 2015-03-26 ENCOUNTER — Inpatient Hospital Stay (HOSPITAL_COMMUNITY): Payer: Medicare Other

## 2015-03-26 LAB — COMPREHENSIVE METABOLIC PANEL
ALBUMIN: 2.8 g/dL — AB (ref 3.5–5.0)
ALK PHOS: 43 U/L (ref 38–126)
ALT: 26 U/L (ref 14–54)
ANION GAP: 9 (ref 5–15)
AST: 38 U/L (ref 15–41)
BILIRUBIN TOTAL: 0.7 mg/dL (ref 0.3–1.2)
BUN: 32 mg/dL — ABNORMAL HIGH (ref 6–20)
CALCIUM: 9.4 mg/dL (ref 8.9–10.3)
CO2: 30 mmol/L (ref 22–32)
Chloride: 105 mmol/L (ref 101–111)
Creatinine, Ser: 1.1 mg/dL — ABNORMAL HIGH (ref 0.44–1.00)
GFR calc non Af Amer: 44 mL/min — ABNORMAL LOW (ref >60)
GFR, EST AFRICAN AMERICAN: 52 mL/min — AB (ref >60)
Glucose, Bld: 129 mg/dL — ABNORMAL HIGH (ref 65–99)
POTASSIUM: 4.8 mmol/L (ref 3.5–5.1)
SODIUM: 144 mmol/L (ref 135–145)
TOTAL PROTEIN: 6 g/dL — AB (ref 6.5–8.1)

## 2015-03-26 LAB — CBC
HEMATOCRIT: 27.9 % — AB (ref 36.0–46.0)
HEMOGLOBIN: 8.8 g/dL — AB (ref 12.0–15.0)
MCH: 30.4 pg (ref 26.0–34.0)
MCHC: 31.5 g/dL (ref 30.0–36.0)
MCV: 96.5 fL (ref 78.0–100.0)
Platelets: 322 10*3/uL (ref 150–400)
RBC: 2.89 MIL/uL — ABNORMAL LOW (ref 3.87–5.11)
RDW: 14.8 % (ref 11.5–15.5)
WBC: 15.4 10*3/uL — ABNORMAL HIGH (ref 4.0–10.5)

## 2015-03-26 LAB — GLUCOSE, CAPILLARY
GLUCOSE-CAPILLARY: 91 mg/dL (ref 65–99)
GLUCOSE-CAPILLARY: 176 mg/dL — AB (ref 65–99)
Glucose-Capillary: 111 mg/dL — ABNORMAL HIGH (ref 65–99)
Glucose-Capillary: 143 mg/dL — ABNORMAL HIGH (ref 65–99)

## 2015-03-26 MED ORDER — DEXTROSE 5 % IV SOLN
100.0000 mg | Freq: Two times a day (BID) | INTRAVENOUS | Status: DC
  Administered 2015-03-26 – 2015-03-27 (×4): 100 mg via INTRAVENOUS
  Filled 2015-03-26 (×5): qty 100

## 2015-03-26 MED ORDER — LACTULOSE 10 GM/15ML PO SOLN
20.0000 g | Freq: Three times a day (TID) | ORAL | Status: AC
Start: 2015-03-26 — End: 2015-03-27
  Administered 2015-03-26: 20 g via ORAL
  Filled 2015-03-26 (×3): qty 30

## 2015-03-26 MED ORDER — FUROSEMIDE 10 MG/ML IJ SOLN
20.0000 mg | Freq: Once | INTRAMUSCULAR | Status: AC
  Administered 2015-03-26: 20 mg via INTRAVENOUS
  Filled 2015-03-26: qty 2

## 2015-03-26 MED ORDER — OSELTAMIVIR PHOSPHATE 30 MG PO CAPS
30.0000 mg | ORAL_CAPSULE | Freq: Two times a day (BID) | ORAL | Status: DC
  Administered 2015-03-26 – 2015-03-30 (×9): 30 mg via ORAL
  Filled 2015-03-26 (×10): qty 1

## 2015-03-26 MED ORDER — OSELTAMIVIR PHOSPHATE 75 MG PO CAPS: 75.0000 mg | ORAL_CAPSULE | Freq: Two times a day (BID) | ORAL | Status: DC

## 2015-03-26 NOTE — Progress Notes (Signed)
Notified NP Lynch of [facility] about pt's foley catheter. She stated that the foley can stay in place tonight. Will continue to monitor.

## 2015-03-26 NOTE — Progress Notes (Signed)
Debra Johnston ZOX:[id number] DOB: 26-Jan-1928 DOA: 03/21/2015 PCP: Debra Kail, MD  Assessment at time of admission on 03/22/15 by Dr Debra Johnston is a 82 y.o. female with PMH of hypertension, hypothyroidism, paroxysmal atrial fibrillation, and chronic diastolic CHF with home O2 requirement who presents to the ED with acute onset right leg pain. Patient has history of chronic pain secondary to osteoarthritis and is status post a right total knee replacement. She had been complaining of severe proximal right leg pain for approximately 2 weeks now and had been evaluated by her physician for this complaint. She was ruled out for DVT and no pathology was identified to explain her pain. She has been ambulating with an increasing difficulty over the past 2 weeks due to the leg pain. Tonight, as her husband was helping her to walk to the bathroom, she heard a loud "pop" with acute worsening of her proximal right leg pain.  In ED, patient was found to be afebrile, saturating well on 2 Lpm, and with vital signs stable. The right leg was noted to be shortened and externally rotated and radiographs confirmed a displaced subtrochanteric proximal right femur fracture. Dr. Darrelyn Johnston of orthopedic surgery was consulted from the emergency department and recommended admission to the hospitalist service. Patient will be admitted for ongoing evaluation and management of proximal femur fracture. Daily Progress Notes since admission 03/23/15: Appreciate cardiology/orthopedics. Patient has low-grade fever and continues to have a hacking cough which started prior to right leg pains. She likely has underlying pneumonia. We will therefore obtain CT chest with contrast, blood cultures and start Levaquin. She seems to be dry. 2-D echocardiogram showed normal systolic function. Discussed Plan with patient's family at the bedside including her daughter-in-law. 03/24/15: Appreciate orthopedics/cardiology. Status post ORIF on 03/23/15.  White count has increased to 12,500. No fever. Leukocytosis probably has an element of leukaemoid reaction to it. CT chest done this morning but results pending. Will continue current management including antibiotics(levaquin), diuretics per cardiology and transferred to orthopedics unit later today when bed becomes available. Patient did okay overnight with no hypoxia or hypotension. She feels weak. 03/25/15: Appreciate orthopedics/cardiology. Increasing leukocytosis concerning- ?steroid element, so is slight bump in creatinine 12.500>14.500(received dose of Dexamethasone intraoperatively),,1`8/1.07>32/1.32(Diuretics/antibiotics/iv contrast- for CT chest), CT chest with contrast showed "Subtle region of reticulonodular opacification over the posterior lateral right middle lobe which may be acute or chronic. This could be seen in an infectious/atypical infectious or inflammatory process. Recommend followup CT 4-6 weeks. Faint 3-4 mm subpleural nodule over the lateral right upper lobe. Recommend followup CT 1 year. This recommendation follows the consensus statement: Guidelines for Management of Small Pulmonary Nodules Detected on CT Scans: A Statement from the Fleischner Society as published in Radiology 2005; 237:395-400. Online at: DietDisorder.cz. Mild cardiomegaly. Minimal atherosclerotic coronary artery disease. Cholelithiasis". Will change Lasix to oral, give gentle fluids in view of worsening renal function, add Flagyl as possibility of aspiration pneumonia, give short course of steroids/albuterol/Mucinex as patient complaining of cough and shortness of breath. She will likely be here for the weekend. She says that her relatives including husband have been sick with a cold at home. 03/26/15: Appreciate orthopedics/cardiology. Continues to be sick, white count keeps increasing 12.500>14.500>15.400. Noted night developments-shortness of breath. Repeat CXR showed "Mildly  increased interstitial densities are noted in both lungs concerning for possible minimal pulmonary edema". I'm concerned that patient may have an atypical infection, viral/bacterial. Influenza A possibility since relatives sick. Will therefore place patient on droplet precautions, obtain influenza  swab, start Tamiflu, discontinue Levaquin, start Doxycycline, continue Flagyl and give dose of Lasix IVS possibility of pulmonary vascular congestion. Will keep Foley catheter in for now as patient incontinent of urine. Patient also complains of constipation. Will give lactulose. Plan quick steroid taper. Problem List Plan  Principal Problem:   Fracture, subtrochanteric, right femur, closed (HCC) Active Problems:   Chronic diastolic CHF (congestive heart failure) (HCC)   PAF (paroxysmal atrial fibrillation) (HCC)   HTN (hypertension)   CKD (chronic kidney disease) stage 3, GFR 30-59 ml/min   Oxygen dependent   Femur fracture, right (HCC)   Fever   CAP (community acquired pneumonia)   Preoperative cardiovascular examination   Acute on chronic diastolic CHF (congestive heart failure), NYHA class 3 (HCC)   Leucocytosis   AKI (acute kidney injury) (HCC)   Lasix 20 mg IV 1  DC Levaquin  Doxycycline  Tamiflu  Lactulose  Droplet precautions  Influenza swab  Keep Foley catheter in for now  Follow cardiology/orthopedics recommendations   Code Status: Full Code Family Communication: Spoke with husband at bedside on 03/25/15 Disposition Plan: Eventually SNF- Early next week Consultants:  Orthopedics  Cardiology Procedures:  IM Nail right femur neck Antibiotics:  Levaquin 03/23/15>>03/26/15  Flagyl 03/25/15>>  Doxycyline 03/26/15>>  Tamiflu 03/26/15>>  HPI/Subjective: Complains of constipation. Anxious,, says she is uncomfortable,, and feels sick. Patient requests that we keep Foley catheter in for now as she is urine incontinent.  Objective: Filed Vitals:   03/26/15 0634 03/26/15  0742  BP: 149/52 133/67  Pulse: 67 68  Temp: 97.8 F (36.6 C) 98.3 F (36.8 C)  Resp: 18 16    Intake/Output Summary (Last 24 hours) at 03/26/15 1019 Last data filed at 03/26/15 0837  Gross per 24 hour  Intake   1805 ml  Output   1700 ml  Net    105 ml   Filed Weights   03/23/15 0200 03/23/15 2100  Weight: 118.3 kg (260 lb 12.9 oz) 115.2 kg (253 lb 15.5 oz)    Exam:   General:  Ill-looking.  Cardiovascular: S1-S2 normal. No murmurs. Pulse regular.  Respiratory: Good air entry bilaterally. No rhonchi or rales.  Abdomen: Soft and nontender. Normal bowel sounds. No organomegaly.  Musculoskeletal: No pedal edema   Neurological: Intact. Anxious.  Data Reviewed: Basic Metabolic Panel:  Recent Labs Lab 03/21/15 2340 03/24/15 0323 03/25/15 0423 03/26/15 0450  NA 142 135 138 144  K 4.1 4.3 4.2 4.8  CL 105 100* 100* 105  CO2 23 26 28 30   GLUCOSE 131* 161* 165* 129*  BUN 11 18 32* 32*  CREATININE 1.06* 1.07* 1.31* 1.10*  CALCIUM 10.3 8.9 9.2 9.4   Liver Function Tests:  Recent Labs Lab 03/24/15 0323 03/25/15 0423 03/26/15 0450  AST 28 34 38  ALT 21 22 26   ALKPHOS 46 45 43  BILITOT 0.9 0.5 0.7  PROT 5.8* 5.9* 6.0*  ALBUMIN 3.0* 2.9* 2.8*   No results for input(s): LIPASE, AMYLASE in the last 168 hours. No results for input(s): AMMONIA in the last 168 hours. CBC:  Recent Labs Lab 03/21/15 2340 03/24/15 0323 03/25/15 0423 03/26/15 0450  WBC 7.8 12.5* 14.7* 15.4*  NEUTROABS 4.4 10.2* 11.9*  --   HGB 12.0 10.2* 9.1* 8.8*  HCT 38.6 31.9* 28.0* 27.9*  MCV 96.5 95.8 94.9 96.5  PLT 291 275 292 322   Cardiac Enzymes: No results for input(s): CKTOTAL, CKMB, CKMBINDEX, TROPONINI in the last 168 hours. BNP (last 3 results)  Recent  Labs  03/16/15 1251  BNP 149.3*    ProBNP (last 3 results)  Recent Labs  11/19/14 1248  PROBNP 92.0    CBG:  Recent Labs Lab 03/25/15 0715 03/25/15 1214 03/25/15 1752 03/25/15 2204 03/26/15 0751  GLUCAP  152* 139* 139* 139* 111*    Recent Results (from the past 240 hour(s))  Surgical pcr screen     Status: None   Collection Time: 03/23/15  3:19 AM  Result Value Ref Range Status   MRSA, PCR NEGATIVE NEGATIVE Final   Staphylococcus aureus NEGATIVE NEGATIVE Final    Comment:        The Xpert SA Assay (FDA approved for NASAL specimens in patients over 76 years of age), is one component of a comprehensive surveillance program.  Test performance has been validated by [facility] for patients greater than or equal to 75 year old. It is not intended to diagnose infection nor to guide or monitor treatment.   Culture, blood (routine x 2)     Status: None (Preliminary result)   Collection Time: 03/23/15 11:00 AM  Result Value Ref Range Status   Specimen Description BLOOD RIGHT ARM  Final   Special Requests IN PEDIATRIC BOTTLE 4 CC  Final   Culture   Final    NO GROWTH 2 DAYS Performed at [facility]    Report Status PENDING  Incomplete  Culture, blood (routine x 2)     Status: None (Preliminary result)   Collection Time: 03/23/15 11:05 AM  Result Value Ref Range Status   Specimen Description BLOOD RIGHT HAND  Final   Special Requests BOTTLES DRAWN AEROBIC ONLY 5 CC BLUE  Final   Culture   Final    NO GROWTH 2 DAYS Performed at [facility]    Report Status PENDING  Incomplete     Studies: Dg Chest Port 1 View  03/26/2015  CLINICAL DATA:  Hypoxia, shortness of breath. EXAM: PORTABLE CHEST 1 VIEW COMPARISON:  March 21, 2015. FINDINGS: Stable cardiomediastinal silhouette. No pneumothorax or pleural effusion is noted. Elevated right hemidiaphragm is noted. Mildly increased bilateral diffuse interstitial densities are noted concerning for possible pulmonary edema. Bony thorax is unremarkable. IMPRESSION: Mildly increased interstitial densities are noted in both lungs concerning for possible minimal pulmonary edema. Electronically Signed   By: Lupita Raider, M.D.   On:  03/26/2015 10:06    Scheduled Meds: . amLODipine  5 mg Oral Daily  . aspirin EC  325 mg Oral BID  . carvedilol  25 mg Oral BID WC  . cholecalciferol  1,000 Units Oral Daily  . docusate sodium  100 mg Oral BID  . dorzolamide  1 drop Both Eyes TID  . doxycycline (VIBRAMYCIN) IV  100 mg Intravenous Q12H  . ferrous sulfate  325 mg Oral TID PC  . FLUoxetine  30 mg Oral Daily  . furosemide  40 mg Oral Daily  . guaiFENesin  1,200 mg Oral BID  . Influenza vac split quadrivalent PF  0.5 mL Intramuscular Tomorrow-1000  . insulin aspart  0-15 Units Subcutaneous TID WC  . lactulose  20 g Oral TID  . levothyroxine  88 mcg Oral QAC breakfast  . magnesium oxide  800 mg Oral Daily  . metronidazole  500 mg Intravenous Q8H  . multivitamin with minerals  1 tablet Oral Daily  . OLANZapine  5 mg Oral Daily  . oseltamivir  75 mg Oral BID  . pantoprazole  40 mg Oral Daily  .  predniSONE  30 mg Oral Daily  . rosuvastatin  5 mg Oral Daily   Continuous Infusions:      Thomasenia Dowse  [facility] Pager [phone]. If 7PM-7AM, please contact night-coverage at [email or web address], password [facility] 03/26/2015, 10:19 AM  LOS: 4 days

## 2015-03-27 LAB — CBC WITH DIFFERENTIAL/PLATELET
Basophils Absolute: 0.1 10*3/uL (ref 0.0–0.1)
Basophils Relative: 1 %
EOS ABS: 0.1 10*3/uL (ref 0.0–0.7)
EOS PCT: 1 %
HEMATOCRIT: 26.6 % — AB (ref 36.0–46.0)
HEMOGLOBIN: 8.5 g/dL — AB (ref 12.0–15.0)
LYMPHS ABS: 3 10*3/uL (ref 0.7–4.0)
Lymphocytes Relative: 20 %
MCH: 31.1 pg (ref 26.0–34.0)
MCHC: 32 g/dL (ref 30.0–36.0)
MCV: 97.4 fL (ref 78.0–100.0)
MONO ABS: 1.8 10*3/uL — AB (ref 0.1–1.0)
Monocytes Relative: 12 %
NEUTROS PCT: 66 %
Neutro Abs: 9.9 10*3/uL — ABNORMAL HIGH (ref 1.7–7.7)
Platelets: 320 10*3/uL (ref 150–400)
RBC: 2.73 MIL/uL — AB (ref 3.87–5.11)
RDW: 14.9 % (ref 11.5–15.5)
WBC: 14.9 10*3/uL — AB (ref 4.0–10.5)

## 2015-03-27 LAB — COMPREHENSIVE METABOLIC PANEL
ALBUMIN: 2.8 g/dL — AB (ref 3.5–5.0)
ALK PHOS: 46 U/L (ref 38–126)
ALT: 36 U/L (ref 14–54)
ANION GAP: 9 (ref 5–15)
AST: 38 U/L (ref 15–41)
BILIRUBIN TOTAL: 0.7 mg/dL (ref 0.3–1.2)
BUN: 39 mg/dL — ABNORMAL HIGH (ref 6–20)
CALCIUM: 8.8 mg/dL — AB (ref 8.9–10.3)
CO2: 29 mmol/L (ref 22–32)
Chloride: 103 mmol/L (ref 101–111)
Creatinine, Ser: 1.22 mg/dL — ABNORMAL HIGH (ref 0.44–1.00)
GFR calc non Af Amer: 39 mL/min — ABNORMAL LOW (ref >60)
GFR, EST AFRICAN AMERICAN: 45 mL/min — AB (ref >60)
Glucose, Bld: 114 mg/dL — ABNORMAL HIGH (ref 65–99)
POTASSIUM: 4.1 mmol/L (ref 3.5–5.1)
SODIUM: 141 mmol/L (ref 135–145)
TOTAL PROTEIN: 5.6 g/dL — AB (ref 6.5–8.1)

## 2015-03-27 LAB — GLUCOSE, CAPILLARY
GLUCOSE-CAPILLARY: 112 mg/dL — AB (ref 65–99)
GLUCOSE-CAPILLARY: 151 mg/dL — AB (ref 65–99)
Glucose-Capillary: 96 mg/dL (ref 65–99)
Glucose-Capillary: 144 mg/dL — ABNORMAL HIGH (ref 65–99)

## 2015-03-27 MED ORDER — LACTULOSE 10 GM/15ML PO SOLN
20.0000 g | Freq: Three times a day (TID) | ORAL | Status: AC
Start: 2015-03-27 — End: 2015-03-28
  Administered 2015-03-27: 20 g via ORAL
  Filled 2015-03-27 (×3): qty 30

## 2015-03-27 NOTE — Progress Notes (Signed)
Debra DELANCEY WUJ:[id number] DOB: 31-Oct-1927 DOA: 03/21/2015 PCP: Donovan Kail, MD  Assessment at time of admission on 03/22/15 by Dr Antionette Char Debra Johnston is a 82 y.o. female with PMH of hypertension, hypothyroidism, paroxysmal atrial fibrillation, and chronic diastolic CHF with home O2 requirement who presents to the ED with acute onset right leg pain. Patient has history of chronic pain secondary to osteoarthritis and is status post a right total knee replacement. She had been complaining of severe proximal right leg pain for approximately 2 weeks now and had been evaluated by her physician for this complaint. She was ruled out for DVT and no pathology was identified to explain her pain. She has been ambulating with an increasing difficulty over the past 2 weeks due to the leg pain. Tonight, as her husband was helping her to walk to the bathroom, she heard a loud "pop" with acute worsening of her proximal right leg pain.  In ED, patient was found to be afebrile, saturating well on 2 Lpm, and with vital signs stable. The right leg was noted to be shortened and externally rotated and radiographs confirmed a displaced subtrochanteric proximal right femur fracture. Dr. Darrelyn Johnston of orthopedic surgery was consulted from the emergency department and recommended admission to the hospitalist service. Patient will be admitted for ongoing evaluation and management of proximal femur fracture. Daily Progress Notes since admission 03/23/15: Appreciate cardiology/orthopedics. Patient has low-grade fever and continues to have a hacking cough which started prior to right leg pains. She likely has underlying pneumonia. We will therefore obtain CT chest with contrast, blood cultures and start Levaquin. She seems to be dry. 2-D echocardiogram showed normal systolic function. Discussed Plan with patient's family at the bedside including her daughter-in-law. 03/24/15: Appreciate orthopedics/cardiology. Status post ORIF on 03/23/15.  White count has increased to 12,500. No fever. Leukocytosis probably has an element of leukaemoid reaction to it. CT chest done this morning but results pending. Will continue current management including antibiotics(levaquin), diuretics per cardiology and transferred to orthopedics unit later today when bed becomes available. Patient did okay overnight with no hypoxia or hypotension. She feels weak. 03/25/15: Appreciate orthopedics/cardiology. Increasing leukocytosis concerning- ?steroid element, so is slight bump in creatinine 12.500>14.500(received dose of Dexamethasone intraoperatively),,1`8/1.07>32/1.32(Diuretics/antibiotics/iv contrast- for CT chest), CT chest with contrast showed "Subtle region of reticulonodular opacification over the posterior lateral right middle lobe which may be acute or chronic. This could be seen in an infectious/atypical infectious or inflammatory process. Recommend followup CT 4-6 weeks. Faint 3-4 mm subpleural nodule over the lateral right upper lobe. Recommend followup CT 1 year. This recommendation follows the consensus statement: Guidelines for Management of Small Pulmonary Nodules Detected on CT Scans: A Statement from the Fleischner Society as published in Radiology 2005; 237:395-400. Online at: DietDisorder.cz. Mild cardiomegaly. Minimal atherosclerotic coronary artery disease. Cholelithiasis". Will change Lasix to oral, give gentle fluids in view of worsening renal function, add Flagyl as possibility of aspiration pneumonia, give short course of steroids/albuterol/Mucinex as patient complaining of cough and shortness of breath. She will likely be here for the weekend. She says that her relatives including husband have been sick with a cold at home. 03/26/15: Appreciate orthopedics/cardiology. Continues to be sick, white count keeps increasing 12.500>14.500>15.400. Noted night developments-shortness of breath. Repeat CXR showed "Mildly  increased interstitial densities are noted in both lungs concerning for possible minimal pulmonary edema". I'm concerned that patient may have an atypical infection, viral/bacterial. Influenza a possibility since relatives sick. Will therefore place patient on droplet precautions, obtain influenza  swab, start Tamiflu, discontinue Levaquin, start Doxycycline, continue Flagyl and give dose of Lasix IV as possibility of pulmonary vascular congestion. Will keep Foley catheter in for now as patient incontinent of urine. Patient also complains of constipation. Will give lactulose. Plan quick steroid taper. 03/27/15: White count slightly improved, 15.400>14.900. Afebrile. Appears more calm. Influenza swab results pending. Complains of constipation and feels sick. Says she had a better day yesterday. Will continue doxycycline/Tamiflu/Flagyl and follow influenza swab results. Will also continue lactulose. Problem List Plan  Principal Problem:   Fracture, subtrochanteric, right femur, closed (HCC) Active Problems:   Chronic diastolic CHF (congestive heart failure) (HCC)   PAF (paroxysmal atrial fibrillation) (HCC)   HTN (hypertension)   CKD (chronic kidney disease) stage 3, GFR 30-59 ml/min   Oxygen dependent   Femur fracture, right (HCC)   Fever   CAP (community acquired pneumonia)   Preoperative cardiovascular examination   Acute on chronic diastolic CHF (congestive heart failure), NYHA class 3 (HCC)   Leucocytosis   AKI (acute kidney injury) (HCC)   Follow Influenza swab results  Lactulose   Continue Doxycycline/Flagyl/Tamiflu  Code Status: Full Code Family Communication: Spoke with husband at bedside Disposition Plan: Eventually SNF- Early next week Consultants:  Orthopedics  Cardiology Procedures:  IM Nail right femur neck Antibiotics:  Levaquin 03/23/15>>03/26/15  Flagyl 03/25/15>>  Doxycyline 03/26/15>>  Tamiflu 03/26/15>>  HPI/Subjective: Feeling sick. Constipated. Less  cough.  Objective: Filed Vitals:   03/27/15 0601 03/27/15 1500  BP: 135/48 129/54  Pulse: 65 77  Temp: 97.8 F (36.6 C) 97.9 F (36.6 C)  Resp: 18 18    Intake/Output Summary (Last 24 hours) at 03/27/15 1515 Last data filed at 03/27/15 1400  Gross per 24 hour  Intake   1890 ml  Output   1475 ml  Net    415 ml   Filed Weights   03/23/15 0200 03/23/15 2100  Weight: 118.3 kg (260 lb 12.9 oz) 115.2 kg (253 lb 15.5 oz)    Exam:   General:  Comfortable at rest.  Cardiovascular: S1-S2 normal. No murmurs. Pulse regular.  Respiratory: Good air entry bilaterally. No rhonchi or rales.  Abdomen: Soft and nontender. Normal bowel sounds. No organomegaly.  Musculoskeletal: No pedal edema   Neurological: Intact  Data Reviewed: Basic Metabolic Panel:  Recent Labs Lab 03/21/15 2340 03/24/15 0323 03/25/15 0423 03/26/15 0450 03/27/15 0509  NA 142 135 138 144 141  K 4.1 4.3 4.2 4.8 4.1  CL 105 100* 100* 105 103  CO2 GLUCOSE 131* 161* 165* 129* 114*  BUN 11 18 32* 32* 39*  CREATININE 1.06* 1.07* 1.31* 1.10* 1.22*  CALCIUM 10.3 8.9 9.2 9.4 8.8*   Liver Function Tests:  Recent Labs Lab 03/24/15 0323 03/25/15 0423 03/26/15 0450 03/27/15 0509  AST 28 34 38 38  ALT 36  ALKPHOS 46 45 43 46  BILITOT 0.9 0.5 0.7 0.7  PROT 5.8* 5.9* 6.0* 5.6*  ALBUMIN 3.0* 2.9* 2.8* 2.8*   No results for input(s): LIPASE, AMYLASE in the last 168 hours. No results for input(s): AMMONIA in the last 168 hours. CBC:  Recent Labs Lab 03/21/15 2340 03/24/15 0323 03/25/15 0423 03/26/15 0450 03/27/15 0509  WBC 7.8 12.5* 14.7* 15.4* 14.9*  NEUTROABS 4.4 10.2* 11.9*  --  9.9*  HGB 12.0 10.2* 9.1* 8.8* 8.5*  HCT 38.6 31.9* 28.0* 27.9* 26.6*  MCV 96.5 95.8 94.9 96.5 97.4  PLT 291 275 292 322 320  Cardiac Enzymes: No results for input(s): CKTOTAL, CKMB, CKMBINDEX, TROPONINI in the last 168 hours. BNP (last 3 results)  Recent Labs  03/16/15 1251  BNP  149.3*    ProBNP (last 3 results)  Recent Labs  11/19/14 1248  PROBNP 92.0    CBG:  Recent Labs Lab 03/26/15 1151 03/26/15 1711 03/26/15 2224 03/27/15 0804 03/27/15 1234  GLUCAP 91 176* 143* 96 112*    Recent Results (from the past 240 hour(s))  Surgical pcr screen     Status: None   Collection Time: 03/23/15  3:19 AM  Result Value Ref Range Status   MRSA, PCR NEGATIVE NEGATIVE Final   Staphylococcus aureus NEGATIVE NEGATIVE Final    Comment:        The Xpert SA Assay (FDA approved for NASAL specimens in patients over 63 years of age), is one component of a comprehensive surveillance program.  Test performance has been validated by [facility] for patients greater than or equal to 84 year old. It is not intended to diagnose infection nor to guide or monitor treatment.   Culture, blood (routine x 2)     Status: None (Preliminary result)   Collection Time: 03/23/15 11:00 AM  Result Value Ref Range Status   Specimen Description BLOOD RIGHT ARM  Final   Special Requests IN PEDIATRIC BOTTLE 4 CC  Final   Culture   Final    NO GROWTH 3 DAYS Performed at [facility]    Report Status PENDING  Incomplete  Culture, blood (routine x 2)     Status: None (Preliminary result)   Collection Time: 03/23/15 11:05 AM  Result Value Ref Range Status   Specimen Description BLOOD RIGHT HAND  Final   Special Requests BOTTLES DRAWN AEROBIC ONLY 5 CC BLUE  Final   Culture   Final    NO GROWTH 3 DAYS Performed at [facility]    Report Status PENDING  Incomplete     Studies: Dg Chest Port 1 View  03/26/2015  CLINICAL DATA:  Hypoxia, shortness of breath. EXAM: PORTABLE CHEST 1 VIEW COMPARISON:  March 21, 2015. FINDINGS: Stable cardiomediastinal silhouette. No pneumothorax or pleural effusion is noted. Elevated right hemidiaphragm is noted. Mildly increased bilateral diffuse interstitial densities are noted concerning for possible pulmonary edema. Bony thorax is  unremarkable. IMPRESSION: Mildly increased interstitial densities are noted in both lungs concerning for possible minimal pulmonary edema. Electronically Signed   By: Lupita Raider, M.D.   On: 03/26/2015 10:06    Scheduled Meds: . amLODipine  5 mg Oral Daily  . aspirin EC  325 mg Oral BID  . carvedilol  25 mg Oral BID WC  . cholecalciferol  1,000 Units Oral Daily  . docusate sodium  100 mg Oral BID  . dorzolamide  1 drop Both Eyes TID  . doxycycline (VIBRAMYCIN) IV  100 mg Intravenous Q12H  . ferrous sulfate  325 mg Oral TID PC  . FLUoxetine  30 mg Oral Daily  . furosemide  40 mg Oral Daily  . guaiFENesin  1,200 mg Oral BID  . Influenza vac split quadrivalent PF  0.5 mL Intramuscular Tomorrow-1000  . insulin aspart  0-15 Units Subcutaneous TID WC  . lactulose  20 g Oral TID  . levothyroxine  88 mcg Oral QAC breakfast  . magnesium oxide  800 mg Oral Daily  . metronidazole  500 mg Intravenous Q8H  . multivitamin with minerals  1 tablet Oral Daily  . OLANZapine  5  mg Oral Daily  . oseltamivir  30 mg Oral BID  . pantoprazole  40 mg Oral Daily  . predniSONE  30 mg Oral Daily  . rosuvastatin  5 mg Oral Daily   Continuous Infusions:    Nezar Buckles  [facility] Pager [phone]. If 7PM-7AM, please contact night-coverage at [email or web address], password [facility] 03/27/2015, 3:15 PM  LOS: 5 days

## 2015-03-28 ENCOUNTER — Inpatient Hospital Stay (HOSPITAL_COMMUNITY): Payer: Medicare Other

## 2015-03-28 LAB — CULTURE, BLOOD (ROUTINE X 2)
CULTURE: NO GROWTH
Culture: NO GROWTH

## 2015-03-28 LAB — CBC WITH DIFFERENTIAL/PLATELET
BASOS ABS: 0.2 10*3/uL — AB (ref 0.0–0.1)
Basophils Relative: 1 %
EOS ABS: 0.3 10*3/uL (ref 0.0–0.7)
Eosinophils Relative: 2 %
HEMATOCRIT: 28.5 % — AB (ref 36.0–46.0)
HEMOGLOBIN: 9 g/dL — AB (ref 12.0–15.0)
LYMPHS PCT: 17 %
Lymphs Abs: 2.8 10*3/uL (ref 0.7–4.0)
MCH: 30.8 pg (ref 26.0–34.0)
MCHC: 31.6 g/dL (ref 30.0–36.0)
MCV: 97.6 fL (ref 78.0–100.0)
Monocytes Absolute: 2 10*3/uL — ABNORMAL HIGH (ref 0.1–1.0)
Monocytes Relative: 12 %
NEUTROS ABS: 11.1 10*3/uL — AB (ref 1.7–7.7)
NEUTROS PCT: 68 %
Platelets: 399 10*3/uL (ref 150–400)
RBC: 2.92 MIL/uL — AB (ref 3.87–5.11)
RDW: 15.2 % (ref 11.5–15.5)
WBC: 16.4 10*3/uL — ABNORMAL HIGH (ref 4.0–10.5)

## 2015-03-28 LAB — BASIC METABOLIC PANEL
ANION GAP: 10 (ref 5–15)
BUN: 39 mg/dL — ABNORMAL HIGH (ref 6–20)
CHLORIDE: 103 mmol/L (ref 101–111)
CO2: 28 mmol/L (ref 22–32)
CREATININE: 1.09 mg/dL — AB (ref 0.44–1.00)
Calcium: 9 mg/dL (ref 8.9–10.3)
GFR calc non Af Amer: 45 mL/min — ABNORMAL LOW (ref >60)
GFR, EST AFRICAN AMERICAN: 52 mL/min — AB (ref >60)
Glucose, Bld: 121 mg/dL — ABNORMAL HIGH (ref 65–99)
POTASSIUM: 4.1 mmol/L (ref 3.5–5.1)
SODIUM: 141 mmol/L (ref 135–145)

## 2015-03-28 LAB — URINALYSIS, ROUTINE W REFLEX MICROSCOPIC
BILIRUBIN URINE: NEGATIVE
GLUCOSE, UA: NEGATIVE mg/dL
HGB URINE DIPSTICK: NEGATIVE
KETONES UR: NEGATIVE mg/dL
Nitrite: NEGATIVE
PROTEIN: NEGATIVE mg/dL
Specific Gravity, Urine: 1.018 (ref 1.005–1.030)
pH: 5.5 (ref 5.0–8.0)

## 2015-03-28 LAB — GLUCOSE, CAPILLARY
GLUCOSE-CAPILLARY: 104 mg/dL — AB (ref 65–99)
GLUCOSE-CAPILLARY: 139 mg/dL — AB (ref 65–99)
GLUCOSE-CAPILLARY: 88 mg/dL (ref 65–99)

## 2015-03-28 LAB — URINE MICROSCOPIC-ADD ON

## 2015-03-28 MED ORDER — SODIUM CHLORIDE 0.9 % IV SOLN
1500.0000 mg | Freq: Once | INTRAVENOUS | Status: AC
  Administered 2015-03-28: 1500 mg via INTRAVENOUS
  Filled 2015-03-28: qty 1500

## 2015-03-28 MED ORDER — PREDNISONE 20 MG PO TABS
20.0000 mg | ORAL_TABLET | Freq: Every day | ORAL | Status: DC
  Administered 2015-03-28 – 2015-03-29 (×2): 20 mg via ORAL
  Filled 2015-03-28 (×2): qty 1

## 2015-03-28 MED ORDER — VANCOMYCIN HCL 10 G IV SOLR
1500.0000 mg | INTRAVENOUS | Status: DC
  Filled 2015-03-28: qty 1500

## 2015-03-28 MED ORDER — VANCOMYCIN HCL IN DEXTROSE 750-5 MG/150ML-% IV SOLN
750.0000 mg | Freq: Two times a day (BID) | INTRAVENOUS | Status: DC
  Administered 2015-03-28 – 2015-03-30 (×4): 750 mg via INTRAVENOUS
  Filled 2015-03-28 (×4): qty 150

## 2015-03-28 MED ORDER — DEXTROSE 5 % IV SOLN
1.0000 g | Freq: Three times a day (TID) | INTRAVENOUS | Status: DC
  Administered 2015-03-28 – 2015-03-30 (×6): 1 g via INTRAVENOUS
  Filled 2015-03-28 (×7): qty 1

## 2015-03-28 NOTE — Progress Notes (Signed)
PT Cancellation Note  Patient Details Name: Debra Johnston MRN: [id number] DOB: [birth year]   Cancelled Treatment:    Reason Eval/Treat Not Completed: Medical issues which prohibited therapy (being assessed for drainage of hip. )   Rada Hay 03/28/2015, 1:26 PM Blanchard Kelch PT [phone]

## 2015-03-28 NOTE — Progress Notes (Addendum)
Debra Johnston:[id number] DOB: 31-Oct-1927 DOA: 03/21/2015 PCP: Donovan Kail, MD  Assessment at time of admission on 03/22/15 by Dr Antionette Char [name] is a 82 y.o. female with PMH of hypertension, hypothyroidism, paroxysmal atrial fibrillation, and chronic diastolic CHF with home O2 requirement who presents to the ED with acute onset right leg pain. Patient has history of chronic pain secondary to osteoarthritis and is status post a right total knee replacement. She had been complaining of severe proximal right leg pain for approximately 2 weeks now and had been evaluated by her physician for this complaint. She was ruled out for DVT and no pathology was identified to explain her pain. She has been ambulating with an increasing difficulty over the past 2 weeks due to the leg pain. Tonight, as her husband was helping her to walk to the bathroom, she heard a loud "pop" with acute worsening of her proximal right leg pain.  In ED, patient was found to be afebrile, saturating well on 2 Lpm, and with vital signs stable. The right leg was noted to be shortened and externally rotated and radiographs confirmed a displaced subtrochanteric proximal right femur fracture. Dr. Darrelyn Hillock of orthopedic surgery was consulted from the emergency department and recommended admission to the hospitalist service. Patient will be admitted for ongoing evaluation and management of proximal femur fracture. Daily Progress Notes since admission 03/23/15: Appreciate cardiology/orthopedics. Patient has low-grade fever and continues to have a hacking cough which started prior to right leg pains. She likely has underlying pneumonia. We will therefore obtain CT chest with contrast, blood cultures and start Levaquin. She seems to be dry. 2-D echocardiogram showed normal systolic function. Discussed Plan with patient's family at the bedside including her daughter-in-law. 03/24/15: Appreciate orthopedics/cardiology. Status post ORIF on 03/23/15.  White count has increased to 12,500. No fever. Leukocytosis probably has an element of leukaemoid reaction to it. CT chest done this morning but results pending. Will continue current management including antibiotics(levaquin), diuretics per cardiology and transferred to orthopedics unit later today when bed becomes available. Patient did okay overnight with no hypoxia or hypotension. She feels weak. 03/25/15: Appreciate orthopedics/cardiology. Increasing leukocytosis concerning- ?steroid element, so is slight bump in creatinine 12.500>14.500(received dose of Dexamethasone intraoperatively),,1`8/1.07>32/1.32(Diuretics/antibiotics/iv contrast- for CT chest), CT chest with contrast showed "Subtle region of reticulonodular opacification over the posterior lateral right middle lobe which may be acute or chronic. This could be seen in an infectious/atypical infectious or inflammatory process. Recommend followup CT 4-6 weeks. Faint 3-4 mm subpleural nodule over the lateral right upper lobe. Recommend followup CT 1 year. This recommendation follows the consensus statement: Guidelines for Management of Small Pulmonary Nodules Detected on CT Scans: A Statement from the Fleischner Society as published in Radiology 2005; 237:395-400. Online at: DietDisorder.cz. Mild cardiomegaly. Minimal atherosclerotic coronary artery disease. Cholelithiasis". Will change Lasix to oral, give gentle fluids in view of worsening renal function, add Flagyl as possibility of aspiration pneumonia, give short course of steroids/albuterol/Mucinex as patient complaining of cough and shortness of breath. She will likely be here for the weekend. She says that her relatives including husband have been sick with a cold at home. 03/26/15: Appreciate orthopedics/cardiology. Continues to be sick, white count keeps increasing 12.500>14.500>15.400. Noted night developments-shortness of breath. Repeat CXR showed "Mildly  increased interstitial densities are noted in both lungs concerning for possible minimal pulmonary edema". I'm concerned that patient may have an atypical infection, viral/bacterial. Influenza a possibility since relatives sick. Will therefore place patient on droplet precautions, obtain influenza  swab, start Tamiflu, discontinue Levaquin, start Doxycycline, continue Flagyl and give dose of Lasix IV as possibility of pulmonary vascular congestion. Will keep Foley catheter in for now as patient incontinent of urine. Patient also complains of constipation. Will give lactulose. Plan quick steroid taper. 03/27/15: White count slightly improved, 15.400>14.900. Afebrile. Appears more calm. Influenza swab results pending. Complains of constipation and feels sick. Says she had a better day yesterday. Will continue doxycycline/Tamiflu/Flagyl and follow influenza swab results. Will also continue lactulose. 03/28/15: Appreciate orthopedics/cardiology. Patient continues to be sick with increasing leukocytosis12.500>14.500>15.400>14.900>16.700(steroid element but it is likely that infection is playing a role still). Patient has drain at surgical site raising concern for infection. She continues to be short of breath. Will change antibiotics to cover for nosocomial organisms including MRSA/Pseudomonas pending further studies which will include UA/urine culture/blood culture/wound culture. Will also obtain chest x-ray and defer imaging of the pelvic area to orthopedics. Meanwhile, will continue steroid taper. Problem List Plan  Principal Problem:   Fracture, subtrochanteric, right femur, closed (HCC) Active Problems:   Chronic diastolic CHF (congestive heart failure) (HCC)   PAF (paroxysmal atrial fibrillation) (HCC)   HTN (hypertension)   CKD (chronic kidney disease) stage 3, GFR 30-59 ml/min   Oxygen dependent   Femur fracture, right (HCC)   Fever   CAP (community acquired pneumonia)   Preoperative cardiovascular  examination   Acute on chronic diastolic CHF (congestive heart failure), NYHA class 3 (HCC)   Leucocytosis   AKI (acute kidney injury) (HCC)   Discontinue doxycycline  Vancomycin/Azactam  Continue Flagyl/Tamiflu  Follow influenza swab  Continue perdnisone taper  Wound culture/UA/UC/BC  Repeat chest x-ray  Follow orthopedics recommendations   Code Status: Full Code Family Communication: Spoke with husband at bedside on 03/27/15 Disposition Plan: Eventually SNF- some time this week Consultants:  Orthopedics  Cardiology Procedures:  IM Nail right femur neck Antibiotics:  Levaquin 03/23/15>>03/26/15  Flagyl 03/25/15>>  Doxycyline 03/26/15>>  Tamiflu 03/26/15>>  HPI/Subjective: Not feeling great.  Objective: Filed Vitals:   03/28/15 0531 03/28/15 0800  BP: 128/57   Pulse: 60   Temp: 97.4 F (36.3 C) 90 F (32.2 C)  Resp: 16     Intake/Output Summary (Last 24 hours) at 03/28/15 0946 Last data filed at 03/28/15 0531  Gross per 24 hour  Intake   1130 ml  Output   1950 ml  Net   -820 ml   Filed Weights   03/23/15 0200 03/23/15 2100  Weight: 118.3 kg (260 lb 12.9 oz) 115.2 kg (253 lb 15.5 oz)    Exam:   General:  Comfortable at rest.  Cardiovascular: S1-S2 normal. No murmurs. Pulse regular.  Respiratory: Good air entry bilaterally. Wheezing.  Abdomen: Soft and nontender. Normal bowel sounds. No organomegaly.  Musculoskeletal: Drainage at surgical site, right thigh.   Neurological: Intact  Data Reviewed: Basic Metabolic Panel:  Recent Labs Lab 03/24/15 0323 03/25/15 0423 03/26/15 0450 03/27/15 0509 03/28/15 0517  NA 135 138 144 141 141  K 4.3 4.2 4.8 4.1 4.1  CL 100* 100* 105 103 103  CO2 GLUCOSE 161* 165* 129* 114* 121*  BUN 18 32* 32* 39* 39*  CREATININE 1.07* 1.31* 1.10* 1.22* 1.09*  CALCIUM 8.9 9.2 9.4 8.8* 9.0   Liver Function Tests:  Recent Labs Lab 03/24/15 0323 03/25/15 0423 03/26/15 0450 03/27/15 0509  AST  28 34 38 38  ALT 36  ALKPHOS 46 45 43 46  BILITOT 0.9 0.5  0.7 0.7  PROT 5.8* 5.9* 6.0* 5.6*  ALBUMIN 3.0* 2.9* 2.8* 2.8*   No results for input(s): LIPASE, AMYLASE in the last 168 hours. No results for input(s): AMMONIA in the last 168 hours. CBC:  Recent Labs Lab 03/21/15 2340 03/24/15 0323 03/25/15 0423 03/26/15 0450 03/27/15 0509 03/28/15 0517  WBC 7.8 12.5* 14.7* 15.4* 14.9* 16.4*  NEUTROABS 4.4 10.2* 11.9*  --  9.9* 11.1*  HGB 12.0 10.2* 9.1* 8.8* 8.5* 9.0*  HCT 38.6 31.9* 28.0* 27.9* 26.6* 28.5*  MCV 96.5 95.8 94.9 96.5 97.4 97.6  PLT 291 275 292 322 320 399   Cardiac Enzymes: No results for input(s): CKTOTAL, CKMB, CKMBINDEX, TROPONINI in the last 168 hours. BNP (last 3 results)  Recent Labs  03/16/15 1251  BNP 149.3*    ProBNP (last 3 results)  Recent Labs  11/19/14 1248  PROBNP 92.0    CBG:  Recent Labs Lab 03/27/15 0804 03/27/15 1234 03/27/15 1823 03/27/15 2223 03/28/15 0755  GLUCAP 96 112* 151* 144* 88    Recent Results (from the past 240 hour(s))  Surgical pcr screen     Status: None   Collection Time: 03/23/15  3:19 AM  Result Value Ref Range Status   MRSA, PCR NEGATIVE NEGATIVE Final   Staphylococcus aureus NEGATIVE NEGATIVE Final    Comment:        The Xpert SA Assay (FDA approved for NASAL specimens in patients over 70 years of age), is one component of a comprehensive surveillance program.  Test performance has been validated by [facility] for patients greater than or equal to 23 year old. It is not intended to diagnose infection nor to guide or monitor treatment.   Culture, blood (routine x 2)     Status: None (Preliminary result)   Collection Time: 03/23/15 11:00 AM  Result Value Ref Range Status   Specimen Description BLOOD RIGHT ARM  Final   Special Requests IN PEDIATRIC BOTTLE 4 CC  Final   Culture   Final    NO GROWTH 4 DAYS Performed at [facility]    Report Status PENDING  Incomplete   Culture, blood (routine x 2)     Status: None (Preliminary result)   Collection Time: 03/23/15 11:05 AM  Result Value Ref Range Status   Specimen Description BLOOD RIGHT HAND  Final   Special Requests BOTTLES DRAWN AEROBIC ONLY 5 CC BLUE  Final   Culture   Final    NO GROWTH 4 DAYS Performed at [facility]    Report Status PENDING  Incomplete     Studies: No results found.  Scheduled Meds: . amLODipine  5 mg Oral Daily  . aspirin EC  325 mg Oral BID  . aztreonam  1 g Intravenous 3 times per day  . carvedilol  25 mg Oral BID WC  . cholecalciferol  1,000 Units Oral Daily  . docusate sodium  100 mg Oral BID  . dorzolamide  1 drop Both Eyes TID  . ferrous sulfate  325 mg Oral TID PC  . FLUoxetine  30 mg Oral Daily  . furosemide  40 mg Oral Daily  . guaiFENesin  1,200 mg Oral BID  . Influenza vac split quadrivalent PF  0.5 mL Intramuscular Tomorrow-1000  . insulin aspart  0-15 Units Subcutaneous TID WC  . lactulose  20 g Oral TID  . levothyroxine  88 mcg Oral QAC breakfast  . magnesium oxide  800 mg Oral Daily  . metronidazole  500  mg Intravenous Q8H  . multivitamin with minerals  1 tablet Oral Daily  . OLANZapine  5 mg Oral Daily  . oseltamivir  30 mg Oral BID  . pantoprazole  40 mg Oral Daily  . predniSONE  20 mg Oral Daily  . rosuvastatin  5 mg Oral Daily   Continuous Infusions:    Irene Mitcham  [facility] Pager [phone]. If 7PM-7AM, please contact night-coverage at [email or web address], password [facility] 03/28/2015, 9:46 AM  LOS: 6 days

## 2015-03-28 NOTE — Progress Notes (Signed)
Pharmacy Antibiotic Note  Debra Johnston is a 82 y.o. female admitted on 03/21/2015 with a R subtrochanteric femur fracture, underwent ORIF 03/24/2015.  She developed pneumonia which has been treated with empiric Levaquin, doxycycline, metronidazole, and Tamiflu [for possible Influenza A since relatives have been sick]. On 03/28/2015 she was noted to have worsening leukocytosis and continued shortness of breath, along with concern over possible surgical site infection.  Antibiotic regimen was empirically changed to aztreonam, metronidazole, and vancomycin; Tamiflu is also being continued for a 5-day course.Marland Kitchen  Pharmacy dosing assistance was requested with vancomycin with levels targeted to cover possible surgical site infection.  Plan: 1. Vancomycin 1500 mg IV x 1, then 750 mg IV q12h, goal trough 10-15 2. Follow renal function and vancomycin trough closely (increased risk for nephrotoxicity due to patient age) 3. Continue aztreonam and metronidazole as ordered by attending MD. 4. F/U on influenza panel results.  Continue Tamiflu for now (5-day course was ordered).  Height:  (177.8 cm) Weight: 253 lb 15.5 oz (115.2 kg) IBW/kg (Calculated) : 68.5  Temp (24hrs), Avg:95.1 F (35.1 C), Min:90 F (32.2 C), Max:97.9 F (36.6 C)   Recent Labs Lab 03/24/15 0323 03/25/15 0423 03/26/15 0450 03/27/15 0509 03/28/15 0517  WBC 12.5* 14.7* 15.4* 14.9* 16.4*  CREATININE 1.07* 1.31* 1.10* 1.22* 1.09*    Estimated Creatinine Clearance: 51.9 mL/min (by C-G formula based on Cr of 1.09).    Allergies  Allergen Reactions  . Penicillins Swelling and Rash    Antimicrobials this admission: 2/1>>Levaquin>>2/4 2/3>>Flagyl>> 2/4>>Doxycycline>>2/6 2/4>>Tamiflu>> 2/6>>Aztreonam>> 2/6>>Vancomycin>>   Dose adjustments this admission: --  Microbiology results: 2/1 MRSA PCR screen: negative 2/1 Blood x 2: ngtd 2/4 Influenza A/B: collected  Thank you for allowing pharmacy to be a part of this  patient's care.  Elie Goody, PharmD, BCPS Pager: [phone] 03/28/2015  10:51 AM

## 2015-03-29 ENCOUNTER — Inpatient Hospital Stay (HOSPITAL_COMMUNITY): Payer: Medicare Other

## 2015-03-29 DIAGNOSIS — R918 Other nonspecific abnormal finding of lung field: Secondary | ICD-10-CM

## 2015-03-29 LAB — COMPREHENSIVE METABOLIC PANEL
ALT: 33 U/L (ref 14–54)
ANION GAP: 10 (ref 5–15)
AST: 24 U/L (ref 15–41)
Albumin: 2.9 g/dL — ABNORMAL LOW (ref 3.5–5.0)
Alkaline Phosphatase: 51 U/L (ref 38–126)
BUN: 39 mg/dL — ABNORMAL HIGH (ref 6–20)
CALCIUM: 8.7 mg/dL — AB (ref 8.9–10.3)
CHLORIDE: 105 mmol/L (ref 101–111)
CO2: 24 mmol/L (ref 22–32)
CREATININE: 0.97 mg/dL (ref 0.44–1.00)
GFR, EST NON AFRICAN AMERICAN: 52 mL/min — AB (ref >60)
Glucose, Bld: 140 mg/dL — ABNORMAL HIGH (ref 65–99)
Potassium: 4.1 mmol/L (ref 3.5–5.1)
SODIUM: 139 mmol/L (ref 135–145)
Total Bilirubin: 0.8 mg/dL (ref 0.3–1.2)
Total Protein: 5.7 g/dL — ABNORMAL LOW (ref 6.5–8.1)

## 2015-03-29 LAB — CBC WITH DIFFERENTIAL/PLATELET
BASOS ABS: 0.2 10*3/uL — AB (ref 0.0–0.1)
Basophils Relative: 1 %
Eosinophils Absolute: 0.7 10*3/uL (ref 0.0–0.7)
Eosinophils Relative: 4 %
HCT: 28.2 % — ABNORMAL LOW (ref 36.0–46.0)
Hemoglobin: 8.9 g/dL — ABNORMAL LOW (ref 12.0–15.0)
LYMPHS PCT: 10 %
Lymphs Abs: 1.7 10*3/uL (ref 0.7–4.0)
MCH: 30.9 pg (ref 26.0–34.0)
MCHC: 31.6 g/dL (ref 30.0–36.0)
MCV: 97.9 fL (ref 78.0–100.0)
MONOS PCT: 11 %
Monocytes Absolute: 1.9 10*3/uL — ABNORMAL HIGH (ref 0.1–1.0)
NEUTROS ABS: 12.4 10*3/uL — AB (ref 1.7–7.7)
NEUTROS PCT: 74 %
PLATELETS: 429 10*3/uL — AB (ref 150–400)
RBC: 2.88 MIL/uL — ABNORMAL LOW (ref 3.87–5.11)
RDW: 15.3 % (ref 11.5–15.5)
WBC: 16.9 10*3/uL — AB (ref 4.0–10.5)

## 2015-03-29 LAB — URINE CULTURE: Culture: NO GROWTH

## 2015-03-29 LAB — GLUCOSE, CAPILLARY
GLUCOSE-CAPILLARY: 95 mg/dL (ref 65–99)
GLUCOSE-CAPILLARY: 120 mg/dL — AB (ref 65–99)
GLUCOSE-CAPILLARY: 135 mg/dL — AB (ref 65–99)

## 2015-03-29 LAB — INFLUENZA VIRUS AG, A+B (DFA)

## 2015-03-29 MED ORDER — IOHEXOL 300 MG/ML  SOLN
25.0000 mL | INTRAMUSCULAR | Status: AC
  Administered 2015-03-29 (×2): 25 mL via ORAL

## 2015-03-29 MED ORDER — IOHEXOL 300 MG/ML  SOLN
100.0000 mL | Freq: Once | INTRAMUSCULAR | Status: AC | PRN
  Administered 2015-03-29: 100 mL via INTRAVENOUS

## 2015-03-29 NOTE — Consult Note (Signed)
[facility]    Date of Admission:  03/21/2015   Total days of antibiotics 7        Day 7 vancomycin        Day 5 metronidazole        Day 4 oseltamivir        Day 2 aztreonam       Reason for Consult: Leukocytosis    Referring Physician: Dr. Conley Canal  Principal Problem:   Leucocytosis Active Problems:   Fracture, subtrochanteric, right femur, closed (HCC)   PAF (paroxysmal atrial fibrillation) (HCC)   HTN (hypertension)   CKD (chronic kidney disease) stage 3, GFR 30-59 ml/min   Oxygen dependent   CAP (community acquired pneumonia)   Preoperative cardiovascular examination   Acute on chronic diastolic CHF (congestive heart failure), NYHA class 3 (HCC)   AKI (acute kidney injury) (HCC)   . amLODipine  5 mg Oral Daily  . aspirin EC  325 mg Oral BID  . aztreonam  1 g Intravenous 3 times per day  . carvedilol  25 mg Oral BID WC  . cholecalciferol  1,000 Units Oral Daily  . docusate sodium  100 mg Oral BID  . dorzolamide  1 drop Both Eyes TID  . ferrous sulfate  325 mg Oral TID PC  . FLUoxetine  30 mg Oral Daily  . furosemide  40 mg Oral Daily  . guaiFENesin  1,200 mg Oral BID  . Influenza vac split quadrivalent PF  0.5 mL Intramuscular Tomorrow-1000  . insulin aspart  0-15 Units Subcutaneous TID WC  . iohexol  25 mL Oral Q1 Hr x 2  . levothyroxine  88 mcg Oral QAC breakfast  . magnesium oxide  800 mg Oral Daily  . metronidazole  500 mg Intravenous Q8H  . multivitamin with minerals  1 tablet Oral Daily  . OLANZapine  5 mg Oral Daily  . oseltamivir  30 mg Oral BID  . pantoprazole  40 mg Oral Daily  . rosuvastatin  5 mg Oral Daily  . vancomycin  750 mg Intravenous Q12H    Recommendations: 1. Continue vancomycin, aztreonam and oseltamivir for now 2. Discontinue metronidazole 3. Await results of influenza antigen testing   Assessment: I suspect that her leukocytosis is multifactorial and related to her hip fracture, recent  surgery and recent steroids. I am less concerned about the possibility of pneumonia. The small reticulonodular infiltrate seen on her CT scan is probably old scar. I suspect that some of the increased interstitial markings yesterday may have been due to acute on chronic heart failure. I do not feel that she needs anaerobic coverage with metronidazole. I will continue coverage for possible CAP/HCAP/Influenza overnight in follow-up tomorrow.   HPI: Debra Johnston is a 82 y.o. female with multiple medical problems who was admitted 1 week ago with an acute right hip fracture. She underwent open reduction and internal fixation. She has been afebrile and her initial chest x-ray was unremarkable. She was started on prednisone and her white blood cell count began to go up slightly. She underwent a chest CT scan which showed a small area of reticulonodular infiltrate in her right lung base. She was started on empiric antibiotic therapy for possible pneumonia. Her white blood cell count has continued to go up slightly. She remains afebrile. She tells me that her husband, son and daughter-in-law have all been sick with respiratory infections. Her husband is currently taking  azithromycin. She states that she developed some slight nonproductive cough after she was admitted. Yesterday's checks x-ray showed some increase interstitial infiltrates particularly in the left upper lobe. She's not had any new upper respiratory symptoms. She has chronic pain but no new recent myalgias. She feels less short of breath today than when she was admitted 1 week ago.   Review of Systems: Review of Systems  Constitutional: Positive for malaise/fatigue. Negative for fever, chills, weight loss and diaphoresis.  HENT: Negative for congestion and sore throat.        She has chronic rhinorrhea that she attributes to her home oxygen therapy.  Respiratory: Positive for cough and shortness of breath. Negative for sputum production.     Cardiovascular: Negative for chest pain.  Gastrointestinal: Negative for heartburn, nausea, vomiting, abdominal pain and diarrhea.  Genitourinary: Negative for dysuria.  Musculoskeletal: Positive for joint pain.  Skin: Negative for rash.  Neurological: Negative for sensory change, focal weakness and headaches.  Psychiatric/Behavioral: Negative for depression.    Past Medical History  Diagnosis Date  . GERD (gastroesophageal reflux disease)   . Glaucoma   . Osteoarthritis   . Hyperlipidemia   . Anxiety   . Depression   . Bronchitis 02/22/2011  . Chronic pain   . Hypothyroidism   . DOE (dyspnea on exertion)     chronic   . CKD (chronic kidney disease) stage 4, GFR 15-29 ml/min (HCC)   . Physical deconditioning   . Falls   . PAF (paroxysmal atrial fibrillation) (HCC)     not felt to be a candidate for coumadin  . Hypertension   . CHF (congestive heart failure) (HCC)     felt to have diastolic dysfunction with normal EF at 60%  . Fibromyalgia     with chronic weakness    Social History  Substance Use Topics  . Smoking status: Never Smoker   . Smokeless tobacco: Never Used  . Alcohol Use: No    Family History  Problem Relation Age of Onset  . Heart failure Mother   . Stroke Father   . Lymphoma Brother   . Cancer Brother   . Heart attack Mother    Allergies  Allergen Reactions  . Penicillins Swelling and Rash    OBJECTIVE: Blood pressure 141/46, pulse 62, temperature 98 F (36.7 C), temperature source Oral, resp. rate 16, height  (1.778 m), weight 253 lb 15.5 oz (115.2 kg), SpO2 94 %.  Physical Exam  Constitutional: She is oriented to person, place, and time. No distress.  She is resting quietly in bed watching television.  HENT:  Mouth/Throat: No oropharyngeal exudate.  Eyes: Conjunctivae are normal.  Cardiovascular: Normal rate and regular rhythm.   No murmur heard. Pulmonary/Chest: Breath sounds normal. She has no wheezes. She has no rales.   Abdominal: Soft. She exhibits no mass. There is no tenderness.  Musculoskeletal: She exhibits edema.  She has a clean dry dressing over her right hip incision. She has 1+ pitting edema to the level of the knees.  Neurological: She is alert and oriented to person, place, and time.  Skin: No rash noted.  Psychiatric: Mood and affect normal.    Lab Results Lab Results  Component Value Date   WBC 16.9* 03/29/2015   HGB 8.9* 03/29/2015   HCT 28.2* 03/29/2015   MCV 97.9 03/29/2015   PLT 429* 03/29/2015    Lab Results  Component Value Date   CREATININE 0.97 03/29/2015   BUN 39* 03/29/2015  NA 139 03/29/2015   K 4.1 03/29/2015   CL 105 03/29/2015   CO2 24 03/29/2015    Lab Results  Component Value Date   ALT 33 03/29/2015   AST 24 03/29/2015   ALKPHOS 51 03/29/2015   BILITOT 0.8 03/29/2015     Microbiology: Recent Results (from the past 240 hour(s))  Surgical pcr screen     Status: None   Collection Time: 03/23/15  3:19 AM  Result Value Ref Range Status   MRSA, PCR NEGATIVE NEGATIVE Final   Staphylococcus aureus NEGATIVE NEGATIVE Final    Comment:        The Xpert SA Assay (FDA approved for NASAL specimens in patients over 80 years of age), is one component of a comprehensive surveillance program.  Test performance has been validated by [facility] for patients greater than or equal to 20 year old. It is not intended to diagnose infection nor to guide or monitor treatment.   Culture, blood (routine x 2)     Status: None   Collection Time: 03/23/15 11:00 AM  Result Value Ref Range Status   Specimen Description BLOOD RIGHT ARM  Final   Special Requests IN PEDIATRIC BOTTLE 4 CC  Final   Culture   Final    NO GROWTH 5 DAYS Performed at [facility]    Report Status 03/28/2015 FINAL  Final  Culture, blood (routine x 2)     Status: None   Collection Time: 03/23/15 11:05 AM  Result Value Ref Range Status   Specimen Description BLOOD RIGHT HAND  Final    Special Requests BOTTLES DRAWN AEROBIC ONLY 5 CC BLUE  Final   Culture   Final    NO GROWTH 5 DAYS Performed at [facility]    Report Status 03/28/2015 FINAL  Final  Culture, blood (routine x 2)     Status: None (Preliminary result)   Collection Time: 03/28/15 11:00 AM  Result Value Ref Range Status   Specimen Description BLOOD LEFT ARM  Final   Special Requests IN PEDIATRIC BOTTLE 2CC  Final   Culture   Final    NO GROWTH 1 DAY Performed at [facility]    Report Status PENDING  Incomplete  Culture, blood (routine x 2)     Status: None (Preliminary result)   Collection Time: 03/28/15 11:11 AM  Result Value Ref Range Status   Specimen Description BLOOD RIGHT HAND  Final   Special Requests IN PEDIATRIC BOTTLE 3CC  Final   Culture   Final    NO GROWTH 1 DAY Performed at [facility]    Report Status PENDING  Incomplete  Culture, Urine     Status: None   Collection Time: 03/28/15 11:30 AM  Result Value Ref Range Status   Specimen Description URINE, CATHETERIZED  Final   Special Requests NONE  Final   Culture   Final    NO GROWTH 1 DAY Performed at [facility]    Report Status 03/29/2015 FINAL  Final  Wound culture     Status: None (Preliminary result)   Collection Time: 03/28/15  2:32 PM  Result Value Ref Range Status   Specimen Description WOUND FEMORAL HEAD  Final   Special Requests NONE  Final   Gram Stain   Final    NO WBC SEEN NO SQUAMOUS EPITHELIAL CELLS SEEN NO ORGANISMS SEEN Performed at [facility]    Culture PENDING  Incomplete   Report Status PENDING  Incomplete    Cliffton Asters, MD [facility] [phone] pager   [phone] cell 03/29/2015, 3:57 PM

## 2015-03-29 NOTE — Progress Notes (Signed)
CSW assisting with d/c planning. Pt will have rehab at [facility] when stable for d/c. CSW will keep SNF / [facility] updated as to pt's medical status.   Cori Razor LCSW [phone]

## 2015-03-29 NOTE — Progress Notes (Signed)
Physical Therapy Treatment Patient Details Name: Debra Johnston MRN: [id number] DOB: [birth year] Today's Date: 03/29/2015    History of Present Illness This 82 year old female was admitted R hip fx and is s/p ORIF.  She had been having severe R LE pain for 2 weeks prior to admission.  Pt has a PMH significant for HTN, A-Fib, CHF, and uses home 02, underlying pneumonia most likely.    PT Comments    Patient tolerated activity better than anticipated, assists with bed mobility. Moving to the Left is better and easier for patient.  Follow Up Recommendations  SNF;Supervision/Assistance - 24 hour     Equipment Recommendations  None recommended by PT    Recommendations for Other Services       Precautions / Restrictions Precautions Precautions: Fall Restrictions RLE Weight Bearing: Partial weight bearing RLE Partial Weight Bearing Percentage or Pounds: none stated    Mobility  Bed Mobility Overal bed mobility: Needs Assistance       Supine to sit: Mod assist;HOB elevated Sit to supine: Max assist;+2 for physical assistance;+2 for safety/equipment   General bed mobility comments: assist for legs to get to EOB.  Pt able to lift trunk.  Guided trunk and assisted with legs for back to bed  Transfers                 General transfer comment: not attempted due to pt's fatique  Ambulation/Gait                 Stairs            Wheelchair Mobility    Modified Rankin (Stroke Patients Only)       Balance   Sitting-balance support: Feet supported   Sitting balance - Comments: supervision sitting EOB for adl tasks when feet supported                            Cognition Arousal/Alertness: Awake/alert Behavior During Therapy: WFL for tasks assessed/performed;Flat affect Overall Cognitive Status: Within Functional Limits for tasks assessed                      Exercises General Exercises - Lower Extremity Ankle Circles/Pumps:  AROM;Both;5 reps Heel Slides: AAROM;Right;10 reps;Supine Hip ABduction/ADduction: AAROM;Right;5 reps;Supine    General Comments        Pertinent Vitals/Pain Pain Assessment: Faces Faces Pain Scale: Hurts even more Pain Location: R leg Pain Intervention(s): Limited activity within patient's tolerance;Monitored during session;Patient requesting pain meds-RN notified    Home Living                      Prior Function            PT Goals (current goals can now be found in the care plan section) Progress towards PT goals: Progressing toward goals    Frequency  Min 3X/week    PT Plan Current plan remains appropriate    Co-evaluation PT/OT/SLP Co-Evaluation/Treatment: Yes Reason for Co-Treatment: Complexity of the patient's impairments (multi-system involvement);For patient/therapist safety PT goals addressed during session: Mobility/safety with mobility OT goals addressed during session: ADL's and self-care     End of Session   Activity Tolerance: Patient tolerated treatment well;Patient limited by fatigue Patient left: in bed;with call bell/phone within reach;with bed alarm set     Time: 8295-6213 PT Time Calculation (min) (ACUTE ONLY): 22 min  Charges:  $Therapeutic Activity: 8-22 mins  G Codes:      Rada Hay 03/29/2015, 4:00 PM Blanchard Kelch PT [phone]

## 2015-03-29 NOTE — Care Management Important Message (Signed)
Important Message  Patient Details  Name: Debra Johnston MRN: [id number] Date of Birth: [birth year]   Medicare Important Message Given:  Yes    Haskell Flirt 03/29/2015, 10:16 AMImportant Message  Patient Details  Name: Debra Johnston MRN: [id number] Date of Birth: 1927-07-04   Medicare Important Message Given:  Yes    Haskell Flirt 03/29/2015, 10:16 AM

## 2015-03-29 NOTE — Progress Notes (Signed)
Debra Johnston:[id number] DOB: 31-Oct-1927 DOA: 03/21/2015 PCP: Donovan Kail, MD  Assessment at time of admission on 03/22/15 by Dr Antionette Char Debra Johnston is a 82 y.o. female with PMH of hypertension, hypothyroidism, paroxysmal atrial fibrillation, and chronic diastolic CHF with home O2 requirement who presents to the ED with acute onset right leg pain. Patient has history of chronic pain secondary to osteoarthritis and is status post a right total knee replacement. She had been complaining of severe proximal right leg pain for approximately 2 weeks now and had been evaluated by her physician for this complaint. She was ruled out for DVT and no pathology was identified to explain her pain. She has been ambulating with an increasing difficulty over the past 2 weeks due to the leg pain. Tonight, as her husband was helping her to walk to the bathroom, she heard a loud "pop" with acute worsening of her proximal right leg pain.  In ED, patient was found to be afebrile, saturating well on 2 Lpm, and with vital signs stable. The right leg was noted to be shortened and externally rotated and radiographs confirmed a displaced subtrochanteric proximal right femur fracture. Dr. Darrelyn Hillock of orthopedic surgery was consulted from the emergency department and recommended admission to the hospitalist service. Patient will be admitted for ongoing evaluation and management of proximal femur fracture. Daily Progress Notes since admission 03/23/15: Appreciate cardiology/orthopedics. Patient has low-grade fever and continues to have a hacking cough which started prior to right leg pains. She likely has underlying pneumonia. We will therefore obtain CT chest with contrast, blood cultures and start Levaquin. She seems to be dry. 2-D echocardiogram showed normal systolic function. Discussed Plan with patient's family at the bedside including her daughter-in-law. 03/24/15: Appreciate orthopedics/cardiology. Status post ORIF on 03/23/15.  White count has increased to 12,500. No fever. Leukocytosis probably has an element of leukaemoid reaction to it. CT chest done this morning but results pending. Will continue current management including antibiotics(levaquin), diuretics per cardiology and transferred to orthopedics unit later today when bed becomes available. Patient did okay overnight with no hypoxia or hypotension. She feels weak. 03/25/15: Appreciate orthopedics/cardiology. Increasing leukocytosis concerning- ?steroid element, so is slight bump in creatinine 12.500>14.500(received dose of Dexamethasone intraoperatively),,1`8/1.07>32/1.32(Diuretics/antibiotics/iv contrast- for CT chest), CT chest with contrast showed "Subtle region of reticulonodular opacification over the posterior lateral right middle lobe which may be acute or chronic. This could be seen in an infectious/atypical infectious or inflammatory process. Recommend followup CT 4-6 weeks. Faint 3-4 mm subpleural nodule over the lateral right upper lobe. Recommend followup CT 1 year. This recommendation follows the consensus statement: Guidelines for Management of Small Pulmonary Nodules Detected on CT Scans: A Statement from the Fleischner Society as published in Radiology 2005; 237:395-400. Online at: DietDisorder.cz. Mild cardiomegaly. Minimal atherosclerotic coronary artery disease. Cholelithiasis". Will change Lasix to oral, give gentle fluids in view of worsening renal function, add Flagyl as possibility of aspiration pneumonia, give short course of steroids/albuterol/Mucinex as patient complaining of cough and shortness of breath. She will likely be here for the weekend. She says that her relatives including husband have been sick with a cold at home. 03/26/15: Appreciate orthopedics/cardiology. Continues to be sick, white count keeps increasing 12.500>14.500>15.400. Noted night developments-shortness of breath. Repeat CXR showed "Mildly  increased interstitial densities are noted in both lungs concerning for possible minimal pulmonary edema". I'm concerned that patient may have an atypical infection, viral/bacterial. Influenza a possibility since relatives sick. Will therefore place patient on droplet precautions, obtain influenza  swab, start Tamiflu, discontinue Levaquin, start Doxycycline, continue Flagyl and give dose of Lasix IV as possibility of pulmonary vascular congestion. Will keep Foley catheter in for now as patient incontinent of urine. Patient also complains of constipation. Will give lactulose. Plan quick steroid taper. 03/27/15: White count slightly improved, 15.400>14.900. Afebrile. Appears more calm. Influenza swab results pending. Complains of constipation and feels sick. Says she had a better day yesterday. Will continue doxycycline/Tamiflu/Flagyl and follow influenza swab results. Will also continue lactulose. 03/28/15: Appreciate orthopedics/cardiology. Patient continues to be sick with increasing leukocytosis12.500>14.500>15.400>14.900>16.700(steroid element but it is likely that infection is playing a role still). Patient has drain at surgical site raising concern for infection. She continues to be short of breath. Will change antibiotics to cover for nosocomial organisms including MRSA/Pseudomonas pending further studies which will include UA/urine culture/blood culture/wound culture. Will also obtain chest x-ray and defer imaging of the pelvic area to orthopedics. Meanwhile, will continue steroid taper. 03/29/15: Has increasing leukocytosis which is concerning. Will obtain CT abdomen and pelvis and consult ID- ? Persistent infection. Meanwhile, will discontinue prednisone, continue current antibiotics and follow ID recommendations(consult and Dr. Orvan Falconer). Appreciate help. Patient will eventually transition to skilled nursing facility. Problem List Plan  Principal Problem:   Fracture, subtrochanteric, right femur, closed  (HCC) Active Problems:   Chronic diastolic CHF (congestive heart failure) (HCC)   PAF (paroxysmal atrial fibrillation) (HCC)   HTN (hypertension)   CKD (chronic kidney disease) stage 3, GFR 30-59 ml/min   Oxygen dependent   Femur fracture, right (HCC)   Fever   CAP (community acquired pneumonia)   Preoperative cardiovascular examination   Acute on chronic diastolic CHF (congestive heart failure), NYHA class 3 (HCC)   Leucocytosis   AKI (acute kidney injury) (HCC)   D/c prednisone  Obtain CT abdomen/pelvis  Follow septic workup including influenza swab  Consult ID   Continue current antibiotics pending ID input   Code Status: Full Code Family Communication: Spoke with husband at bedside on 03/27/15 Disposition Plan: Eventually SNF- some time this week Consultants:  Orthopedics  Cardiology  ID Procedures:  IM Nail right femur neck Antibiotics:  Levaquin 03/23/15>>03/26/15  Flagyl 03/25/15>>  Doxycyline 03/26/15>>  Tamiflu 03/26/15>>  HPI/Subjective: Feels about the same  Objective: Filed Vitals:   03/29/15 0601 03/29/15 0941  BP: 137/48 119/48  Pulse: 57   Temp: 97.6 F (36.4 C)   Resp: 16     Intake/Output Summary (Last 24 hours) at 03/29/15 1251 Last data filed at 03/29/15 1003  Gross per 24 hour  Intake   1000 ml  Output   2350 ml  Net  -1350 ml   Filed Weights   03/23/15 0200 03/23/15 2100  Weight: 118.3 kg (260 lb 12.9 oz) 115.2 kg (253 lb 15.5 oz)    Exam:   General:  Comfortable at rest.  Cardiovascular: S1-S2 normal. No murmurs. Pulse regular.  Respiratory: Good air entry bilaterally. No rhonchi or rales.  Abdomen: Soft and nontender. Normal bowel sounds. No organomegaly.  Musculoskeletal: No pedal edema. Drainage right thigh  Neurological: Intact  Data Reviewed: Basic Metabolic Panel:  Recent Labs Lab 03/24/15 0323 03/25/15 0423 03/26/15 0450 03/27/15 0509 03/28/15 0517  NA 135 138 144 141 141  K 4.3 4.2 4.8 4.1 4.1  CL  100* 100* 105 103 103  CO2 GLUCOSE 161* 165* 129* 114* 121*  BUN 18 32* 32* 39* 39*  CREATININE 1.07* 1.31* 1.10* 1.22* 1.09*  CALCIUM 8.9 9.2  9.4 8.8* 9.0   Liver Function Tests:  Recent Labs Lab 03/24/15 0323 03/25/15 0423 03/26/15 0450 03/27/15 0509  AST 28 34 38 38  ALT 36  ALKPHOS 46 45 43 46  BILITOT 0.9 0.5 0.7 0.7  PROT 5.8* 5.9* 6.0* 5.6*  ALBUMIN 3.0* 2.9* 2.8* 2.8*   No results for input(s): LIPASE, AMYLASE in the last 168 hours. No results for input(s): AMMONIA in the last 168 hours. CBC:  Recent Labs Lab 03/24/15 0323 03/25/15 0423 03/26/15 0450 03/27/15 0509 03/28/15 0517  WBC 12.5* 14.7* 15.4* 14.9* 16.4*  NEUTROABS 10.2* 11.9*  --  9.9* 11.1*  HGB 10.2* 9.1* 8.8* 8.5* 9.0*  HCT 31.9* 28.0* 27.9* 26.6* 28.5*  MCV 95.8 94.9 96.5 97.4 97.6  PLT 275 292 322 320 399   Cardiac Enzymes: No results for input(s): CKTOTAL, CKMB, CKMBINDEX, TROPONINI in the last 168 hours. BNP (last 3 results)  Recent Labs  03/16/15 1251  BNP 149.3*    ProBNP (last 3 results)  Recent Labs  11/19/14 1248  PROBNP 92.0    CBG:  Recent Labs Lab 03/28/15 0755 03/28/15 1159 03/28/15 2345 03/29/15 0722 03/29/15 1203  GLUCAP 88 104* 139* 95 120*    Recent Results (from the past 240 hour(s))  Surgical pcr screen     Status: None   Collection Time: 03/23/15  3:19 AM  Result Value Ref Range Status   MRSA, PCR NEGATIVE NEGATIVE Final   Staphylococcus aureus NEGATIVE NEGATIVE Final    Comment:        The Xpert SA Assay (FDA approved for NASAL specimens in patients over 53 years of age), is one component of a comprehensive surveillance program.  Test performance has been validated by [facility] for patients greater than or equal to 45 year old. It is not intended to diagnose infection nor to guide or monitor treatment.   Culture, blood (routine x 2)     Status: None   Collection Time: 03/23/15 11:00 AM  Result Value Ref Range  Status   Specimen Description BLOOD RIGHT ARM  Final   Special Requests IN PEDIATRIC BOTTLE 4 CC  Final   Culture   Final    NO GROWTH 5 DAYS Performed at [facility]    Report Status 03/28/2015 FINAL  Final  Culture, blood (routine x 2)     Status: None   Collection Time: 03/23/15 11:05 AM  Result Value Ref Range Status   Specimen Description BLOOD RIGHT HAND  Final   Special Requests BOTTLES DRAWN AEROBIC ONLY 5 CC BLUE  Final   Culture   Final    NO GROWTH 5 DAYS Performed at [facility]    Report Status 03/28/2015 FINAL  Final  Culture, Urine     Status: None   Collection Time: 03/28/15 11:30 AM  Result Value Ref Range Status   Specimen Description URINE, CATHETERIZED  Final   Special Requests NONE  Final   Culture   Final    NO GROWTH 1 DAY Performed at [facility]    Report Status 03/29/2015 FINAL  Final  Wound culture     Status: None (Preliminary result)   Collection Time: 03/28/15  2:32 PM  Result Value Ref Range Status   Specimen Description WOUND FEMORAL HEAD  Final   Special Requests NONE  Final   Gram Stain   Final    NO WBC SEEN NO SQUAMOUS EPITHELIAL CELLS SEEN NO ORGANISMS SEEN Performed at  [facility]    Culture PENDING  Incomplete   Report Status PENDING  Incomplete     Studies: Dg Chest Port 1 View  03/28/2015  CLINICAL DATA:  Shortness of breath EXAM: PORTABLE CHEST 1 VIEW COMPARISON:  03/25/2014 FINDINGS: Mild patchy left upper lobe opacity, new/ increased, suspicious for pneumonia. Increased interstitial markings.  No frank interstitial edema. Mild eventration of the right hemidiaphragm. No pleural effusion or pneumothorax. Cardiomegaly. IMPRESSION: Mild patchy left upper lobe opacity, new/ increased, suspicious for pneumonia. Electronically Signed   By: Charline Bills M.D.   On: 03/28/2015 10:34    Scheduled Meds: . amLODipine  5 mg Oral Daily  . aspirin EC  325 mg Oral BID  . aztreonam  1 g Intravenous 3  times per day  . carvedilol  25 mg Oral BID WC  . cholecalciferol  1,000 Units Oral Daily  . docusate sodium  100 mg Oral BID  . dorzolamide  1 drop Both Eyes TID  . ferrous sulfate  325 mg Oral TID PC  . FLUoxetine  30 mg Oral Daily  . furosemide  40 mg Oral Daily  . guaiFENesin  1,200 mg Oral BID  . Influenza vac split quadrivalent PF  0.5 mL Intramuscular Tomorrow-1000  . insulin aspart  0-15 Units Subcutaneous TID WC  . levothyroxine  88 mcg Oral QAC breakfast  . magnesium oxide  800 mg Oral Daily  . metronidazole  500 mg Intravenous Q8H  . multivitamin with minerals  1 tablet Oral Daily  . OLANZapine  5 mg Oral Daily  . oseltamivir  30 mg Oral BID  . pantoprazole  40 mg Oral Daily  . predniSONE  20 mg Oral Daily  . rosuvastatin  5 mg Oral Daily  . vancomycin  750 mg Intravenous Q12H   Continuous Infusions:    Brinkley Peet  [facility] Pager [phone]. If 7PM-7AM, please contact night-coverage at [email or web address], password [facility] 03/29/2015, 12:51 PM  LOS: 7 days

## 2015-03-29 NOTE — Progress Notes (Signed)
Occupational Therapy Treatment Patient Details Name: Debra Johnston MRN: [id number] DOB: 03-Oct-1927 Today's Date: 03/29/2015    History of present illness This 82 year old female was admitted R hip fx and is s/p ORIF.  She had been having severe R LE pain for 2 weeks prior to admission.  Pt has a PMH significant for HTN, A-Fib, CHF, and uses home 02, underlying pneumonia most likely.   OT comments  Pt willing to participate with therapy.  Worked on grooming at [employer] as she had completed bathing/dressing earlier.  Pt fatiques easily  Follow Up Recommendations  SNF    Equipment Recommendations  3 in 1 bedside comode    Recommendations for Other Services      Precautions / Restrictions Precautions Precautions: Fall Restrictions RLE Weight Bearing: Partial weight bearing       Mobility Bed Mobility         Supine to sit: Mod assist;HOB elevated Sit to supine: Max assist;+2 for physical assistance;+2 for safety/equipment   General bed mobility comments: assist for legs to get to EOB.  Pt able to lift trunk.  Guided trunk and assisted with legs for back to bed  Transfers                 General transfer comment: not attempted due to pt's fatique    Balance   Sitting-balance support: Feet supported   Sitting balance - Comments: supervision sitting EOB for adl tasks when feet supported                           ADL       Grooming: Brushing hair;Set up;Sitting (unsupported)                                 General ADL Comments: Pt had been bathed this am with NT assistance. Sat EOB and performed grooming tasks.  Did not stand on this visit.  Pt on 4 liters of 02.  Sats 89-93%.  Dyspnea 2/4 with minimal activity      Vision                     Perception     Praxis      Cognition   Behavior During Therapy: WFL for tasks assessed/performed Overall Cognitive Status: Within Functional Limits for tasks assessed                        Extremity/Trunk Assessment               Exercises     Shoulder Instructions       General Comments      Pertinent Vitals/ Pain       Pain Assessment: Faces Faces Pain Scale: Hurts a little bit Pain Location: R hip Pain Intervention(s): Limited activity within patient's tolerance;Monitored during session;Repositioned  Home Living                                          Prior Functioning/Environment              Frequency Min 2X/week     Progress Toward Goals  OT Goals(current goals can now be found in the care plan section)  Progress towards OT goals: Progressing  toward goals (slowly)     Plan      Co-evaluation      Reason for Co-Treatment: Complexity of the patient's impairments (multi-system involvement);For patient/therapist safety   OT goals addressed during session: ADL's and self-care;Strengthening/ROM      End of Session     Activity Tolerance Patient limited by fatigue   Patient Left in bed;with call bell/phone within reach (with PT for exercises)   Nurse Communication          Time: 1610-9604 OT Time Calculation (min): 14 min  Charges: OT General Charges $OT Visit: 1 Procedure OT Treatments $Self Care/Home Management :  (no charge due to co-tx)  Surie Suchocki 03/29/2015, 3:36 PM  Marica Otter, OTR/L [phone] 03/29/2015

## 2015-03-30 DIAGNOSIS — R05 Cough: Secondary | ICD-10-CM

## 2015-03-30 DIAGNOSIS — R5383 Other fatigue: Secondary | ICD-10-CM

## 2015-03-30 DIAGNOSIS — R5381 Other malaise: Secondary | ICD-10-CM

## 2015-03-30 LAB — CBC WITH DIFFERENTIAL/PLATELET
BASOS ABS: 0.2 10*3/uL — AB (ref 0.0–0.1)
Basophils Relative: 1 %
EOS PCT: 4 %
Eosinophils Absolute: 0.6 10*3/uL (ref 0.0–0.7)
HCT: 25.4 % — ABNORMAL LOW (ref 36.0–46.0)
HEMOGLOBIN: 8.3 g/dL — AB (ref 12.0–15.0)
LYMPHS PCT: 17 %
Lymphs Abs: 2.7 10*3/uL (ref 0.7–4.0)
MCH: 31.2 pg (ref 26.0–34.0)
MCHC: 32.7 g/dL (ref 30.0–36.0)
MCV: 95.5 fL (ref 78.0–100.0)
MONOS PCT: 10 %
Monocytes Absolute: 1.6 10*3/uL — ABNORMAL HIGH (ref 0.1–1.0)
NEUTROS ABS: 11 10*3/uL — AB (ref 1.7–7.7)
Neutrophils Relative %: 68 %
Platelets: 366 10*3/uL (ref 150–400)
RBC: 2.66 MIL/uL — AB (ref 3.87–5.11)
RDW: 15.2 % (ref 11.5–15.5)
WBC: 16.1 10*3/uL — AB (ref 4.0–10.5)
nRBC: 2 /100 WBC — ABNORMAL HIGH

## 2015-03-30 LAB — GLUCOSE, CAPILLARY
GLUCOSE-CAPILLARY: 109 mg/dL — AB (ref 65–99)
Glucose-Capillary: 102 mg/dL — ABNORMAL HIGH (ref 65–99)
Glucose-Capillary: 102 mg/dL — ABNORMAL HIGH (ref 65–99)
Glucose-Capillary: 108 mg/dL — ABNORMAL HIGH (ref 65–99)
Glucose-Capillary: 86 mg/dL (ref 65–99)

## 2015-03-30 LAB — COMPREHENSIVE METABOLIC PANEL
ALBUMIN: 2.7 g/dL — AB (ref 3.5–5.0)
ALK PHOS: 47 U/L (ref 38–126)
ALT: 28 U/L (ref 14–54)
AST: 19 U/L (ref 15–41)
Anion gap: 8 (ref 5–15)
BUN: 36 mg/dL — ABNORMAL HIGH (ref 6–20)
CALCIUM: 8.4 mg/dL — AB (ref 8.9–10.3)
CHLORIDE: 105 mmol/L (ref 101–111)
CO2: 27 mmol/L (ref 22–32)
CREATININE: 0.98 mg/dL (ref 0.44–1.00)
GFR calc Af Amer: 59 mL/min — ABNORMAL LOW (ref >60)
GFR calc non Af Amer: 51 mL/min — ABNORMAL LOW (ref >60)
GLUCOSE: 95 mg/dL (ref 65–99)
Potassium: 4.3 mmol/L (ref 3.5–5.1)
SODIUM: 140 mmol/L (ref 135–145)
Total Bilirubin: 0.7 mg/dL (ref 0.3–1.2)
Total Protein: 5.1 g/dL — ABNORMAL LOW (ref 6.5–8.1)

## 2015-03-30 NOTE — Progress Notes (Signed)
Physical Therapy Treatment Patient Details Name: Debra Johnston MRN: [id number] DOB: 01-Jul-1927 Today's Date: 03/30/2015    History of Present Illness This 82 year old female was admitted R hip fx and is s/p ORIF.  She had been having severe R LE pain for 2 weeks prior to admission.  Pt has a PMH significant for HTN, A-Fib, CHF, and uses home 02, underlying pneumonia most likely.    PT Comments    Pt on 4 lts O2 sats 96%.  Uses 2 lts at home.  Assisted from supine to EOB Max Assist + 2 with increased time to minimize anxiety and pain level.  VC's for purse lip breathing as anxiety increased.  Very fearful of falling.  Attempted sit to stand twice however pt was unable to fully rise hips off bed and unable to support self.  B knees started to buckle and pt started to slide off edge of bed. Total assist to get hips back onto bed and return pt to supine.  Was able to perform AAROM TE's.  Pt will need ST Rehab at SNF prior to safely returning home.   Follow Up Recommendations  SNF     Equipment Recommendations       Recommendations for Other Services       Precautions / Restrictions Precautions Precautions: Fall Precaution Comments: home O2   2 lts > 1 year Restrictions Weight Bearing Restrictions: Yes RLE Weight Bearing: Partial weight bearing Other Position/Activity Restrictions: PWB    Mobility  Bed Mobility Overal bed mobility: Needs Assistance Bed Mobility: Supine to Sit;Sit to Supine     Supine to sit: Max assist;+2 for physical assistance;+2 for safety/equipment Sit to supine: Total assist;+2 for physical assistance;+2 for safety/equipment   General bed mobility comments: assist for legs to get to EOB.  Pt able to lift trunk.  Guided trunk and assisted with legs for back to bed  Transfers Overall transfer level: Needs assistance Equipment used: Bilateral platform walker Transfers: Sit to/from Stand Sit to Stand: Total assist;+2 physical assistance;+2  safety/equipment;From elevated surface         General transfer comment: attempted twice using EVA walker however pt unable to fully clear hips off bed and unable to support her weight starting to slid down the edge of the bed.  Total assist to get hips back on bed before she fell.   Ambulation/Gait                 Stairs            Wheelchair Mobility    Modified Rankin (Stroke Patients Only)       Balance                                    Cognition Arousal/Alertness: Awake/alert Behavior During Therapy: WFL for tasks assessed/performed;Flat affect Overall Cognitive Status: Within Functional Limits for tasks assessed                      Exercises      General Comments        Pertinent Vitals/Pain Pain Assessment: 0-10 Pain Score: 8  Pain Location: R mid thigh Pain Descriptors / Indicators: Grimacing;Tender;Sore Pain Intervention(s): Monitored during session;Premedicated before session;Repositioned;Ice applied    Home Living                      Prior Function  PT Goals (current goals can now be found in the care plan section) Progress towards PT goals: Progressing toward goals    Frequency  Min 3X/week    PT Plan Current plan remains appropriate    Co-evaluation             End of Session Equipment Utilized During Treatment: Gait belt Activity Tolerance: Treatment limited secondary to medical complications (Comment) Patient left: in bed;with call bell/phone within reach;with bed alarm set     Time: 1335-1405 PT Time Calculation (min) (ACUTE ONLY): 30 min  Charges:  $Therapeutic Exercise: 8-22 mins $Therapeutic Activity: 8-22 mins                    G Codes:      Felecia Shelling  PTA [facility]  Acute  Rehab Pager      [phone]

## 2015-03-30 NOTE — Progress Notes (Signed)
[facility] PROGRESS NOTE  Debra Johnston ZOX:[id number] DOB: [birth year] DOA: 03/21/2015  PCP: Donovan Kail, MD  Brief HPI: 82 year old Caucasian female with a past medical history of hypertension, hypothyroidism, paroxysmal atrial fibrillation, chronic diastolic CHF on home oxygen, presented with complaints of right leg pain. Evaluation revealed a displaced subtrochanteric proximal right femur fracture. Orthopedics was consulted. Patient underwent surgery. Her hospitalization was complicated by persistent leukocytosis requiring consultation by ID physicians.  Past medical history:  Past Medical History  Diagnosis Date  . GERD (gastroesophageal reflux disease)   . Glaucoma   . Osteoarthritis   . Hyperlipidemia   . Anxiety   . Depression   . Bronchitis 02/22/2011  . Chronic pain   . Hypothyroidism   . DOE (dyspnea on exertion)     chronic   . CKD (chronic kidney disease) stage 4, GFR 15-29 ml/min (HCC)   . Physical deconditioning   . Falls   . PAF (paroxysmal atrial fibrillation) (HCC)     not felt to be a candidate for coumadin  . Hypertension   . CHF (congestive heart failure) (HCC)     felt to have diastolic dysfunction with normal EF at 60%  . Fibromyalgia     with chronic weakness    Consultants: Orthopedics. Infectious disease. Cardiology  Procedures:  IM Nail right femur neck  Antibiotics:  Levaquin 03/23/15>>03/26/15  Flagyl 03/25/15>>  Doxycyline 03/26/15>>  Tamiflu 03/26/15>>  Subjective: Patient complains of feeling weak overall. Denies any cough. No nausea, vomiting. No abdominal pain. No diarrhea.Does have pain in the right thigh area.  Objective: Vital Signs  Filed Vitals:   03/30/15 0911 03/30/15 1022 03/30/15 1031 03/30/15 1037  BP: 124/34 127/78 113/67 114/77  Pulse: 62 60 68 70  Temp: 97.7 F (36.5 C)     TempSrc: Oral     Resp: 18     Height:      Weight:      SpO2: 94%  90% 94%    Intake/Output Summary (Last 24 hours) at 03/30/15  1242 Last data filed at 03/30/15 1052  Gross per 24 hour  Intake   1210 ml  Output   2781 ml  Net  -1571 ml   Filed Weights   03/23/15 0200 03/23/15 2100  Weight: 118.3 kg (260 lb 12.9 oz) 115.2 kg (253 lb 15.5 oz)    General appearance: alert, cooperative, appears stated age and no distress Resp: clear to auscultation bilaterally Cardio: regular rate and rhythm, S1, S2 normal, no murmur, click, rub or gallop GI: soft, non-tender; bowel sounds normal; no masses,  no organomegaly Extremities: Dressing noted over the right thigh. Per nurse, there has been some drainage from the wound. Neurologic: Awake and alert. Oriented 3. No focal neurological deficits.  Lab Results:  Basic Metabolic Panel:  Recent Labs Lab 03/26/15 0450 03/27/15 0509 03/28/15 0517 03/29/15 1226 03/30/15 0445  NA 144 141 141 139 140  K 4.8 4.1 4.1 4.1 4.3  CL 105 103 103 105 105  CO2 GLUCOSE 129* 114* 121* 140* 95  BUN 32* 39* 39* 39* 36*  CREATININE 1.10* 1.22* 1.09* 0.97 0.98  CALCIUM 9.4 8.8* 9.0 8.7* 8.4*   Liver Function Tests:  Recent Labs Lab 03/25/15 0423 03/26/15 0450 03/27/15 0509 03/29/15 1226 03/30/15 0445  AST 34 38 38 24 19  ALT 22 26 36 33 28  ALKPHOS 45 43 46 51 47  BILITOT 0.5 0.7 0.7 0.8 0.7  PROT 5.9* 6.0*  5.6* 5.7* 5.1*  ALBUMIN 2.9* 2.8* 2.8* 2.9* 2.7*   CBC:  Recent Labs Lab 03/25/15 0423 03/26/15 0450 03/27/15 0509 03/28/15 0517 03/29/15 1226 03/30/15 0445  WBC 14.7* 15.4* 14.9* 16.4* 16.9* 16.1*  NEUTROABS 11.9*  --  9.9* 11.1* 12.4* 11.0*  HGB 9.1* 8.8* 8.5* 9.0* 8.9* 8.3*  HCT 28.0* 27.9* 26.6* 28.5* 28.2* 25.4*  MCV 94.9 96.5 97.4 97.6 97.9 95.5  PLT 292 322 320 399 429* 366   BNP (last 3 results)  Recent Labs  03/16/15 1251  BNP 149.3*    CBG:  Recent Labs Lab 03/29/15 1203 03/29/15 1719 03/30/15 0007 03/30/15 0732 03/30/15 1113  GLUCAP 120* 135* 102* 86 108*    Recent Results (from the past 240 hour(s))    Surgical pcr screen     Status: None   Collection Time: 03/23/15  3:19 AM  Result Value Ref Range Status   MRSA, PCR NEGATIVE NEGATIVE Final   Staphylococcus aureus NEGATIVE NEGATIVE Final    Comment:        The Xpert SA Assay (FDA approved for NASAL specimens in patients over 37 years of age), is one component of a comprehensive surveillance program.  Test performance has been validated by [facility] for patients greater than or equal to 86 year old. It is not intended to diagnose infection nor to guide or monitor treatment.   Culture, blood (routine x 2)     Status: None   Collection Time: 03/23/15 11:00 AM  Result Value Ref Range Status   Specimen Description BLOOD RIGHT ARM  Final   Special Requests IN PEDIATRIC BOTTLE 4 CC  Final   Culture   Final    NO GROWTH 5 DAYS Performed at [facility]    Report Status 03/28/2015 FINAL  Final  Culture, blood (routine x 2)     Status: None   Collection Time: 03/23/15 11:05 AM  Result Value Ref Range Status   Specimen Description BLOOD RIGHT HAND  Final   Special Requests BOTTLES DRAWN AEROBIC ONLY 5 CC BLUE  Final   Culture   Final    NO GROWTH 5 DAYS Performed at [facility]    Report Status 03/28/2015 FINAL  Final  Influenza virus ag, a+b (DFA)     Status: None   Collection Time: 03/26/15 12:30 PM  Result Value Ref Range Status   Influenza Virus A and B Ag REPORT  Final    Comment: (NOTE) Influenza Virus Type A#B Antigen, DFA SOURCE : NOT SUPPLIED Influenza Type A Ag, DFA        Negative for influenza virus Influenza Type B Ag, DFA        Negative for influenza virus For maximum sensitivity in the diagnosis of viral infections, a negative or suspected positive result should be confirmed by viral culture. Performed at [facility], blood (routine x 2)     Status: None (Preliminary result)   Collection Time: 03/28/15 11:00 AM  Result Value Ref Range Status   Specimen Description  BLOOD LEFT ARM  Final   Special Requests IN PEDIATRIC BOTTLE 2CC  Final   Culture   Final    NO GROWTH 1 DAY Performed at [facility]    Report Status PENDING  Incomplete  Culture, blood (routine x 2)     Status: None (Preliminary result)   Collection Time: 03/28/15 11:11 AM  Result Value Ref Range Status   Specimen Description BLOOD RIGHT HAND  Final   Special Requests IN PEDIATRIC BOTTLE 3CC  Final   Culture   Final    NO GROWTH 1 DAY Performed at [facility]    Report Status PENDING  Incomplete  Culture, Urine     Status: None   Collection Time: 03/28/15 11:30 AM  Result Value Ref Range Status   Specimen Description URINE, CATHETERIZED  Final   Special Requests NONE  Final   Culture   Final    NO GROWTH 1 DAY Performed at [facility]    Report Status 03/29/2015 FINAL  Final  Wound culture     Status: None (Preliminary result)   Collection Time: 03/28/15  2:32 PM  Result Value Ref Range Status   Specimen Description WOUND FEMORAL HEAD  Final   Special Requests NONE  Final   Gram Stain   Final    NO WBC SEEN NO SQUAMOUS EPITHELIAL CELLS SEEN NO ORGANISMS SEEN Performed at [facility]    Culture   Final    NO GROWTH 2 DAYS Performed at [facility]    Report Status PENDING  Incomplete      Studies/Results: Ct Abdomen Pelvis W Contrast  03/29/2015  CLINICAL DATA:  Sepsis. Admitted 1 week ago with an acute right hip fracture. EXAM: CT ABDOMEN AND PELVIS WITH CONTRAST TECHNIQUE: Multidetector CT imaging of the abdomen and pelvis was performed using the standard protocol following bolus administration of intravenous contrast. CONTRAST:  OMNIPAQUE IOHEXOL 300 MG/ML  SOLN COMPARISON:  Chest CT dated 03/24/2015, pelvis radiograph dated 03/21/2015 and renal ultrasound dated 05/09/2011. FINDINGS: Lower chest: Interval small bilateral pleural effusions. Mild right lower lobe linear atelectasis. Hepatobiliary: Previously noted small  gallstones in the gallbladder. No gallbladder wall thickening or pericholecystic fluid. The largest stone measures 5 mm. Normal appearing liver. Pancreas: No mass, inflammatory changes, or other significant abnormality. Spleen: Within normal limits in size and appearance. Adrenals/Urinary Tract: Small left adrenal mass or focal thickening measuring 1.8 x 1.3 cm on image number 25. Normal appearing right adrenal gland. Small right renal cysts. The normal appearing left kidney and ureters. Decompressed urinary bladder with a Foley catheter in place. No urinary tract calculi are seen. Stomach/Bowel: Small number of sigmoid colon diverticula. No evidence of diverticulitis or appendicitis. The appendix is surgically absent by history. Vascular/Lymphatic: Atheromatous arterial calcifications. No enlarged lymph nodes. Reproductive: Surgically absent uterus.  No adnexal masses. Other: None. Musculoskeletal: Interval intramedullary rod and screw fixation of the previously demonstrated subtrochanteric right femur fracture. Right femoral head cysts. Mild bilateral hip degenerative changes. Lumbar and lower thoracic spine degenerative changes. IMPRESSION: 1. No acute abdominal abnormality. 2. Interval small bilateral pleural effusions and mild right lower lobe atelectasis. 3. Small left adrenal adenoma or focal thickening. 4. Cholelithiasis. 5. Mild sigmoid diverticulosis. Electronically Signed   By: Beckie Salts M.D.   On: 03/29/2015 17:29    Medications:  Scheduled: . amLODipine  5 mg Oral Daily  . aspirin EC  325 mg Oral BID  . carvedilol  25 mg Oral BID WC  . cholecalciferol  1,000 Units Oral Daily  . docusate sodium  100 mg Oral BID  . dorzolamide  1 drop Both Eyes TID  . ferrous sulfate  325 mg Oral TID PC  . FLUoxetine  30 mg Oral Daily  . furosemide  40 mg Oral Daily  . guaiFENesin  1,200 mg Oral BID  . Influenza vac split quadrivalent PF  0.5 mL Intramuscular Tomorrow-1000  .  insulin aspart  0-15 Units  Subcutaneous TID WC  . levothyroxine  88 mcg Oral QAC breakfast  . magnesium oxide  800 mg Oral Daily  . multivitamin with minerals  1 tablet Oral Daily  . OLANZapine  5 mg Oral Daily  . pantoprazole  40 mg Oral Daily  . rosuvastatin  5 mg Oral Daily   Continuous:  ZOX:[id number] **OR** acetaminophen, albuterol, alum & mag hydroxide-simeth, bisacodyl, fluticasone, HYDROcodone-acetaminophen, HYDROmorphone (DILAUDID) injection, LORazepam, menthol-cetylpyridinium **OR** phenol, methocarbamol **OR** methocarbamol (ROBAXIN)  IV, metoCLOPramide **OR** metoCLOPramide (REGLAN) injection, morphine injection, ondansetron **OR** ondansetron (ZOFRAN) IV, polyethylene glycol, senna-docusate  Assessment/Plan:  Principal Problem:   Leucocytosis Active Problems:   PAF (paroxysmal atrial fibrillation) (HCC)   HTN (hypertension)   Fracture, subtrochanteric, right femur, closed (HCC)   CKD (chronic kidney disease) stage 3, GFR 30-59 ml/min   Oxygen dependent   CAP (community acquired pneumonia)   Preoperative cardiovascular examination   Acute on chronic diastolic CHF (congestive heart failure), NYHA class 3 (HCC)   AKI (acute kidney injury) (HCC)    Right femur fracture Status post surgery. Patient apparently has some wound drainage. Orthopedics to address. No evidence for infection based on cultures.  Persistent leukocytosis Etiology is unclear. No clear evidence for pneumonia based on symptoms. Influenza PCR is negative. Discussed with Dr. Orvan Falconer today. He recommends discontinuing antibiotics for now. Monitor for 1 more day. If patient remains afebrile with stable WBC she could be discharged to skilled nursing facility.  Acute on chronic diastolic congestive heart failure Now stable. No evidence for acute decompensation currently. Lungs are clear to auscultation. Continue Lasix.  History of proximal atrial fibrillation Stable. Not noted to be on anticoagulation due to falls. CHA2DS2-VASc  Score 4. Continue aspirin.  History of hypothyroidism Continue levothyroxine.  Acute renal failure Etiology unclear. Currently resolved.  DVT Prophylaxis: On aspirin twice a day    Code Status: Full code  Family Communication: Discussed with the patient  Disposition Plan: Anticipate discharge to SNF in 24-48 hours.    LOS: 8 days   [facility]  [facility] Pager [phone] 03/30/2015, 12:42 PM  If 7PM-7AM, please contact night-coverage at [email or web address], password [facility]

## 2015-03-30 NOTE — Progress Notes (Signed)
     Subjective: 7 Days Post-Op Procedure(s) (LRB): INTRAMEDULLARY (IM) NAIL FEMORAL (Right)   Patient reports pain as moderate.   States that they keep changing the dressing do to drainage. No states events throughout the night.  Objective:   VITALS:   Filed Vitals:   03/30/15 0800 03/30/15 0911  BP: 133/36 124/34  Pulse: 61 62  Temp:  97.7 F (36.5 C)  Resp: 20 18    Incision: moderate drainage No cellulitis present Compartment soft  LABS  Recent Labs  03/28/15 0517 03/29/15 1226 03/30/15 0445  HGB 9.0* 8.9* 8.3*  HCT 28.5* 28.2* 25.4*  WBC 16.4* 16.9* 16.1*  PLT 399 429* 366     Recent Labs  03/28/15 0517 03/29/15 1226 03/30/15 0445  NA 141 139 140  K 4.1 4.1 4.3  BUN 39* 39* 36*  CREATININE 1.09* 0.97 0.98  GLUCOSE 121* 140* 95     Assessment/Plan: 7 Days Post-Op Procedure(s) (LRB): INTRAMEDULLARY (IM) NAIL FEMORAL (Right)   Medicine caring for patient Leucocytosis Up with therapy Discharge to SNF eventually     Anastasio Auerbach. Burak Zerbe   PAC  03/30/2015, 9:18 AM

## 2015-03-30 NOTE — Progress Notes (Signed)
Patient ID: Debra Johnston, female   DOB: February 17, 1928, 82 y.o.   MRN: [id number]         [facility]    Date of Admission:  03/21/2015           Day 8 vancomycin        Day 5 oseltamivir        Day 3 aztreonam  Principal Problem:   Leucocytosis Active Problems:   Fracture, subtrochanteric, right femur, closed (HCC)   PAF (paroxysmal atrial fibrillation) (HCC)   HTN (hypertension)   CKD (chronic kidney disease) stage 3, GFR 30-59 ml/min   Oxygen dependent   CAP (community acquired pneumonia)   Preoperative cardiovascular examination   Acute on chronic diastolic CHF (congestive heart failure), NYHA class 3 (HCC)   AKI (acute kidney injury) (HCC)   . amLODipine  5 mg Oral Daily  . aspirin EC  325 mg Oral BID  . aztreonam  1 g Intravenous 3 times per day  . carvedilol  25 mg Oral BID WC  . cholecalciferol  1,000 Units Oral Daily  . docusate sodium  100 mg Oral BID  . dorzolamide  1 drop Both Eyes TID  . ferrous sulfate  325 mg Oral TID PC  . FLUoxetine  30 mg Oral Daily  . furosemide  40 mg Oral Daily  . guaiFENesin  1,200 mg Oral BID  . Influenza vac split quadrivalent PF  0.5 mL Intramuscular Tomorrow-1000  . insulin aspart  0-15 Units Subcutaneous TID WC  . levothyroxine  88 mcg Oral QAC breakfast  . magnesium oxide  800 mg Oral Daily  . multivitamin with minerals  1 tablet Oral Daily  . OLANZapine  5 mg Oral Daily  . oseltamivir  30 mg Oral BID  . pantoprazole  40 mg Oral Daily  . rosuvastatin  5 mg Oral Daily  . vancomycin  750 mg Intravenous Q12H    SUBJECTIVE: She continues to have a very mild, loose cough that is nonproductive. She is not anymore short of breath than normal. She remains extremely weak.  Review of Systems: Review of Systems  Constitutional: Positive for malaise/fatigue. Negative for fever, chills, weight loss and diaphoresis.  HENT: Negative for sore throat.   Respiratory: Positive for cough and shortness of breath.  Negative for sputum production.   Cardiovascular: Negative for chest pain.  Gastrointestinal: Negative for nausea, vomiting and diarrhea.  Genitourinary: Negative for dysuria.  Musculoskeletal: Negative for joint pain.  Skin: Negative for rash.  Neurological: Positive for weakness. Negative for headaches.    Past Medical History  Diagnosis Date  . GERD (gastroesophageal reflux disease)   . Glaucoma   . Osteoarthritis   . Hyperlipidemia   . Anxiety   . Depression   . Bronchitis 02/22/2011  . Chronic pain   . Hypothyroidism   . DOE (dyspnea on exertion)     chronic   . CKD (chronic kidney disease) stage 4, GFR 15-29 ml/min (HCC)   . Physical deconditioning   . Falls   . PAF (paroxysmal atrial fibrillation) (HCC)     not felt to be a candidate for coumadin  . Hypertension   . CHF (congestive heart failure) (HCC)     felt to have diastolic dysfunction with normal EF at 60%  . Fibromyalgia     with chronic weakness    Social History  Substance Use Topics  . Smoking status: Never Smoker   . Smokeless tobacco: Never Used  .  Alcohol Use: No    Family History  Problem Relation Age of Onset  . Heart failure Mother   . Stroke Father   . Lymphoma Brother   . Cancer Brother   . Heart attack Mother    Allergies  Allergen Reactions  . Penicillins Swelling and Rash    OBJECTIVE: Filed Vitals:   03/30/15 0544 03/30/15 0800 03/30/15 0911 03/30/15 1022  BP: 127/52 133/36 124/34 127/78  Pulse: 59 61 62 60  Temp: 97.8 F (36.6 C)  97.7 F (36.5 C)   TempSrc: Oral  Oral   Resp: Height:      Weight:      SpO2: 94% 90% 94%    Body mass index is 36.44 kg/(m^2).  Physical Exam  Constitutional: She is oriented to person, place, and time. No distress.  She is pale and weak but otherwise in no distress. Her husband is visiting.  HENT:  Mouth/Throat: No oropharyngeal exudate.  Eyes: Conjunctivae are normal.  Cardiovascular: Normal rate and regular rhythm.     No murmur heard. Pulmonary/Chest: Breath sounds normal. She has no wheezes. She has no rales.  Abdominal: Soft. There is no tenderness.  Neurological: She is alert and oriented to person, place, and time.  Skin: No rash noted.  Psychiatric: Mood and affect normal.    Lab Results Lab Results  Component Value Date   WBC 16.1* 03/30/2015   HGB 8.3* 03/30/2015   HCT 25.4* 03/30/2015   MCV 95.5 03/30/2015   PLT 366 03/30/2015    Lab Results  Component Value Date   CREATININE 0.98 03/30/2015   BUN 36* 03/30/2015   NA 140 03/30/2015   K 4.3 03/30/2015   CL 105 03/30/2015   CO2 27 03/30/2015    Lab Results  Component Value Date   ALT 28 03/30/2015   AST 19 03/30/2015   ALKPHOS 47 03/30/2015   BILITOT 0.7 03/30/2015     Microbiology: Recent Results (from the past 240 hour(s))  Surgical pcr screen     Status: None   Collection Time: 03/23/15  3:19 AM  Result Value Ref Range Status   MRSA, PCR NEGATIVE NEGATIVE Final   Staphylococcus aureus NEGATIVE NEGATIVE Final    Comment:        The Xpert SA Assay (FDA approved for NASAL specimens in patients over 41 years of age), is one component of a comprehensive surveillance program.  Test performance has been validated by [facility] for patients greater than or equal to 48 year old. It is not intended to diagnose infection nor to guide or monitor treatment.   Culture, blood (routine x 2)     Status: None   Collection Time: 03/23/15 11:00 AM  Result Value Ref Range Status   Specimen Description BLOOD RIGHT ARM  Final   Special Requests IN PEDIATRIC BOTTLE 4 CC  Final   Culture   Final    NO GROWTH 5 DAYS Performed at [facility]    Report Status 03/28/2015 FINAL  Final  Culture, blood (routine x 2)     Status: None   Collection Time: 03/23/15 11:05 AM  Result Value Ref Range Status   Specimen Description BLOOD RIGHT HAND  Final   Special Requests BOTTLES DRAWN AEROBIC ONLY 5 CC BLUE  Final   Culture    Final    NO GROWTH 5 DAYS Performed at [facility]    Report Status 03/28/2015 FINAL  Final  Influenza  virus ag, a+b (DFA)     Status: None   Collection Time: 03/26/15 12:30 PM  Result Value Ref Range Status   Influenza Virus A and B Ag REPORT  Final    Comment: (NOTE) Influenza Virus Type A#B Antigen, DFA SOURCE : NOT SUPPLIED Influenza Type A Ag, DFA        Negative for influenza virus Influenza Type B Ag, DFA        Negative for influenza virus For maximum sensitivity in the diagnosis of viral infections, a negative or suspected positive result should be confirmed by viral culture. Performed at [facility], blood (routine x 2)     Status: None (Preliminary result)   Collection Time: 03/28/15 11:00 AM  Result Value Ref Range Status   Specimen Description BLOOD LEFT ARM  Final   Special Requests IN PEDIATRIC BOTTLE 2CC  Final   Culture   Final    NO GROWTH 1 DAY Performed at [facility]    Report Status PENDING  Incomplete  Culture, blood (routine x 2)     Status: None (Preliminary result)   Collection Time: 03/28/15 11:11 AM  Result Value Ref Range Status   Specimen Description BLOOD RIGHT HAND  Final   Special Requests IN PEDIATRIC BOTTLE 3CC  Final   Culture   Final    NO GROWTH 1 DAY Performed at [facility]    Report Status PENDING  Incomplete  Culture, Urine     Status: None   Collection Time: 03/28/15 11:30 AM  Result Value Ref Range Status   Specimen Description URINE, CATHETERIZED  Final   Special Requests NONE  Final   Culture   Final    NO GROWTH 1 DAY Performed at [facility]    Report Status 03/29/2015 FINAL  Final  Wound culture     Status: None (Preliminary result)   Collection Time: 03/28/15  2:32 PM  Result Value Ref Range Status   Specimen Description WOUND FEMORAL HEAD  Final   Special Requests NONE  Final   Gram Stain   Final    NO WBC SEEN NO SQUAMOUS EPITHELIAL CELLS SEEN NO ORGANISMS  SEEN Performed at [facility]    Culture   Final    NO GROWTH 2 DAYS Performed at [facility]    Report Status PENDING  Incomplete     ASSESSMENT: Her influenza antigens are negative. She has no current clinical evidence of active pneumonia. I feel it is reasonable to stop antimicrobial therapy now.  PLAN: 1. Discontinue vancomycin, aztreonam and oseltamivir 2. Discontinue droplet precautions 3. I will sign off now but please call if I can be of further assistance  Cliffton Asters, MD [facility] for Infectious Disease [facility] [phone] pager   [phone] cell 03/30/2015, 10:35 AM

## 2015-03-31 ENCOUNTER — Inpatient Hospital Stay (HOSPITAL_COMMUNITY): Payer: Medicare Other

## 2015-03-31 DIAGNOSIS — N179 Acute kidney failure, unspecified: Secondary | ICD-10-CM

## 2015-03-31 DIAGNOSIS — D72829 Elevated white blood cell count, unspecified: Secondary | ICD-10-CM

## 2015-03-31 DIAGNOSIS — M79601 Pain in right arm: Secondary | ICD-10-CM

## 2015-03-31 DIAGNOSIS — J189 Pneumonia, unspecified organism: Secondary | ICD-10-CM

## 2015-03-31 DIAGNOSIS — M79604 Pain in right leg: Secondary | ICD-10-CM

## 2015-03-31 LAB — WOUND CULTURE
CULTURE: NO GROWTH
Gram Stain: NONE SEEN

## 2015-03-31 LAB — BASIC METABOLIC PANEL
Anion gap: 8 (ref 5–15)
BUN: 34 mg/dL — AB (ref 6–20)
CHLORIDE: 105 mmol/L (ref 101–111)
CO2: 27 mmol/L (ref 22–32)
CREATININE: 0.98 mg/dL (ref 0.44–1.00)
Calcium: 8.5 mg/dL — ABNORMAL LOW (ref 8.9–10.3)
GFR calc Af Amer: 59 mL/min — ABNORMAL LOW (ref >60)
GFR, EST NON AFRICAN AMERICAN: 51 mL/min — AB (ref >60)
Glucose, Bld: 98 mg/dL (ref 65–99)
POTASSIUM: 4.2 mmol/L (ref 3.5–5.1)
SODIUM: 140 mmol/L (ref 135–145)

## 2015-03-31 LAB — CBC
HCT: 27.5 % — ABNORMAL LOW (ref 36.0–46.0)
HEMOGLOBIN: 8.6 g/dL — AB (ref 12.0–15.0)
MCH: 30.9 pg (ref 26.0–34.0)
MCHC: 31.3 g/dL (ref 30.0–36.0)
MCV: 98.9 fL (ref 78.0–100.0)
PLATELETS: 421 10*3/uL — AB (ref 150–400)
RBC: 2.78 MIL/uL — AB (ref 3.87–5.11)
RDW: 15.8 % — ABNORMAL HIGH (ref 11.5–15.5)
WBC: 15.5 10*3/uL — ABNORMAL HIGH (ref 4.0–10.5)

## 2015-03-31 LAB — GLUCOSE, CAPILLARY
GLUCOSE-CAPILLARY: 89 mg/dL (ref 65–99)
GLUCOSE-CAPILLARY: 124 mg/dL — AB (ref 65–99)
Glucose-Capillary: 102 mg/dL — ABNORMAL HIGH (ref 65–99)

## 2015-03-31 LAB — HEMOGLOBIN A1C
Hgb A1c MFr Bld: 6 % — ABNORMAL HIGH (ref 4.8–5.6)
Mean Plasma Glucose: 126 mg/dL

## 2015-03-31 LAB — CK: CK TOTAL: 32 U/L — AB (ref 38–234)

## 2015-03-31 MED ORDER — POLYETHYLENE GLYCOL 3350 17 G PO PACK
17.0000 g | PACK | Freq: Two times a day (BID) | ORAL | Status: DC
  Administered 2015-03-31 – 2015-04-01 (×2): 17 g via ORAL

## 2015-03-31 NOTE — Progress Notes (Signed)
     Subjective: 8 Days Post-Op Procedure(s) (LRB): INTRAMEDULLARY (IM) NAIL FEMORAL (Right)   Patient not expressing much pain.  No events throughout the night.  Still having significant serous drainage for which the nurses are changing the dressing bid and as needed.  Objective:   VITALS:   Filed Vitals:   03/30/15 2131 03/31/15 0520  BP: 135/48 124/77  Pulse: 60 68  Temp: 98.6 F (37 C) 98.4 F (36.9 C)  Resp:  17    Dorsiflexion/Plantar flexion intact Incision: moderate drainage No cellulitis present Compartment soft  LABS  Recent Labs  03/29/15 1226 03/30/15 0445 03/31/15 0402  HGB 8.9* 8.3* 8.6*  HCT 28.2* 25.4* 27.5*  WBC 16.9* 16.1* 15.5*  PLT 429* 366 421*     Recent Labs  03/29/15 1226 03/30/15 0445 03/31/15 0402  NA 139 140 140  K 4.1 4.3 4.2  BUN 39* 36* 34*  CREATININE 0.97 0.98 0.98  GLUCOSE 140* 95 98     Assessment/Plan: 8 Days Post-Op Procedure(s) (LRB): INTRAMEDULLARY (IM) NAIL FEMORAL (Right)  Medicine caring for patient Leucocytosis Up with therapy Discharge to SNF eventually   Anastasio Auerbach. Cortlin Marano   PAC  03/31/2015, 7:18 AM

## 2015-03-31 NOTE — Progress Notes (Signed)
VASCULAR LAB PRELIMINARY  PRELIMINARY  PRELIMINARY  PRELIMINARY  Bilateral lower extremity venous Doppler has been completed.   Bilateral:  No evidence of DVT, superficial thrombosis, or Baker's Cyst.  Jenetta Loges, RVT, RDMS 03/31/2015, 12:02 PM

## 2015-03-31 NOTE — Progress Notes (Addendum)
[facility] PROGRESS NOTE  Debra Johnston ZOX:[id number] DOB: 11/08/1927 DOA: 03/21/2015  PCP: Donovan Kail, MD  Brief HPI: 82 year old Caucasian female with a past medical history of hypertension, hypothyroidism, paroxysmal atrial fibrillation, chronic diastolic CHF on home oxygen, presented with complaints of right leg pain. Evaluation revealed a displaced subtrochanteric proximal right femur fracture. Orthopedics was consulted. Patient underwent surgery. Her hospitalization was complicated by persistent leukocytosis requiring consultation by ID physician. Dr. Orvan Falconer has taken patient off antibiotics,  Past medical history:  Past Medical History  Diagnosis Date  . GERD (gastroesophageal reflux disease)   . Glaucoma   . Osteoarthritis   . Hyperlipidemia   . Anxiety   . Depression   . Bronchitis 02/22/2011  . Chronic pain   . Hypothyroidism   . DOE (dyspnea on exertion)     chronic   . CKD (chronic kidney disease) stage 4, GFR 15-29 ml/min (HCC)   . Physical deconditioning   . Falls   . PAF (paroxysmal atrial fibrillation) (HCC)     not felt to be a candidate for coumadin  . Hypertension   . CHF (congestive heart failure) (HCC)     felt to have diastolic dysfunction with normal EF at 60%  . Fibromyalgia     with chronic weakness    Consultants: Orthopedics. Infectious disease. Cardiology  Procedures:  IM Nail right femur neck  Anti-infectives    Start     Dose/Rate Route Frequency Ordered Stop   03/28/15 2200  vancomycin (VANCOCIN) IVPB 750 mg/150 ml premix  Status:  Discontinued     750 mg 150 mL/hr over 60 Minutes Intravenous Every 12 hours 03/28/15 1044 03/30/15 1039   03/28/15 1100  aztreonam (AZACTAM) 1 g in dextrose 5 % 50 mL IVPB  Status:  Discontinued     1 g 100 mL/hr over 30 Minutes Intravenous 3 times per day 03/28/15 0945 03/30/15 1039   03/28/15 1100  vancomycin (VANCOCIN) 1,500 mg in sodium chloride 0.9 % 500 mL IVPB  Status:  Discontinued     1,500 mg 250 mL/hr over 120 Minutes Intravenous Every 24 hours 03/28/15 1033 03/28/15 1044   03/28/15 1100  vancomycin (VANCOCIN) 1,500 mg in sodium chloride 0.9 % 500 mL IVPB     1,500 mg 250 mL/hr over 120 Minutes Intravenous  Once 03/28/15 1044 03/28/15 1412   03/26/15 1200  levofloxacin (LEVAQUIN) IVPB 750 mg  Status:  Discontinued     750 mg 100 mL/hr over 90 Minutes Intravenous Every 48 hours 03/25/15 0859 03/26/15 1014   03/26/15 1045  doxycycline (VIBRAMYCIN) 100 mg in dextrose 5 % 250 mL IVPB  Status:  Discontinued     100 mg 125 mL/hr over 120 Minutes Intravenous Every 12 hours 03/26/15 1014 03/28/15 0945   03/26/15 1045  oseltamivir (TAMIFLU) capsule 30 mg  Status:  Discontinued     30 mg Oral 2 times daily 03/26/15 1023 03/30/15 1039   03/26/15 1015  oseltamivir (TAMIFLU) capsule 75 mg  Status:  Discontinued     75 mg Oral 2 times daily 03/26/15 1014 03/26/15 1023   03/25/15 1000  metroNIDAZOLE (FLAGYL) IVPB 500 mg  Status:  Discontinued     500 mg 100 mL/hr over 60 Minutes Intravenous Every 8 hours 03/25/15 0839 03/29/15 1610   03/24/15 0600  vancomycin (VANCOCIN) IVPB 1000 mg/200 mL premix     1,000 mg 200 mL/hr over 60 Minutes Intravenous Every 12 hours 03/23/15 2057 03/24/15 0610   03/23/15 1200  levofloxacin (  LEVAQUIN) IVPB 750 mg  Status:  Discontinued     750 mg 100 mL/hr over 90 Minutes Intravenous Every 24 hours 03/23/15 1036 03/25/15 0859   03/23/15 0600  vancomycin (VANCOCIN) IVPB 1000 mg/200 mL premix  Status:  Discontinued     1,000 mg 200 mL/hr over 60 Minutes Intravenous On call to O.R. 03/22/15 1657 03/22/15 1701   03/22/15 1715  vancomycin (VANCOCIN) IVPB 1000 mg/200 mL premix     1,000 mg 200 mL/hr over 60 Minutes Intravenous To Surgery 03/22/15 1701 03/23/15 1735       Subjective: Patient complaining of right leg pain, pressure, needs to reposition, very uncomfortable, also states that she is constipated  Objective: Vital Signs  Filed Vitals:    03/30/15 1928 03/30/15 2131 03/31/15 0404 03/31/15 0520  BP:  135/48  124/77  Pulse: 67 60  68  Temp:  98.6 F (37 C)  98.4 F (36.9 C)  TempSrc:  Oral  Oral  Resp:    17  Height:      Weight:      SpO2: 97% 92% 92% 91%    Intake/Output Summary (Last 24 hours) at 03/31/15 1059 Last data filed at 03/31/15 1037  Gross per 24 hour  Intake   1200 ml  Output   2750 ml  Net  -1550 ml   Filed Weights   03/23/15 0200 03/23/15 2100  Weight: 118.3 kg (260 lb 12.9 oz) 115.2 kg (253 lb 15.5 oz)    General appearance: alert, cooperative, appears stated age and no distress Resp: clear to auscultation bilaterally Cardio: regular rate and rhythm, S1, S2 normal, no murmur, click, rub or gallop GI: soft, non-tender; bowel sounds normal; no masses,  no organomegaly Extremities: Dressing noted over the right thigh. Per nurse, there has been some drainage from the wound. Neurologic: Awake and alert. Oriented 3. No focal neurological deficits.  Lab Results:  Basic Metabolic Panel:  Recent Labs Lab 03/27/15 0509 03/28/15 0517 03/29/15 1226 03/30/15 0445 03/31/15 0402  NA 141 141 139 140 140  K 4.1 4.1 4.1 4.3 4.2  CL 103 103 105 105 105  CO2 29 28 24 27 27   GLUCOSE 114* 121* 140* 95 98  BUN 39* 39* 39* 36* 34*  CREATININE 1.22* 1.09* 0.97 0.98 0.98  CALCIUM 8.8* 9.0 8.7* 8.4* 8.5*   Liver Function Tests:  Recent Labs Lab 03/25/15 0423 03/26/15 0450 03/27/15 0509 03/29/15 1226 03/30/15 0445  AST 34 38 38 24 19  ALT 22 26 36 33 28  ALKPHOS 45 43 46 51 47  BILITOT 0.5 0.7 0.7 0.8 0.7  PROT 5.9* 6.0* 5.6* 5.7* 5.1*  ALBUMIN 2.9* 2.8* 2.8* 2.9* 2.7*   CBC:  Recent Labs Lab 03/25/15 0423  03/27/15 0509 03/28/15 0517 03/29/15 1226 03/30/15 0445 03/31/15 0402  WBC 14.7*  < > 14.9* 16.4* 16.9* 16.1* 15.5*  NEUTROABS 11.9*  --  9.9* 11.1* 12.4* 11.0*  --   HGB 9.1*  < > 8.5* 9.0* 8.9* 8.3* 8.6*  HCT 28.0*  < > 26.6* 28.5* 28.2* 25.4* 27.5*  MCV 94.9  < > 97.4 97.6  97.9 95.5 98.9  PLT 292  < > 320 399 429* 366 421*  < > = values in this interval not displayed. BNP (last 3 results)  Recent Labs  03/16/15 1251  BNP 149.3*    CBG:  Recent Labs Lab 03/30/15 0732 03/30/15 1113 03/30/15 1720 03/30/15 2136 03/31/15 0727  GLUCAP 86 108* 102* 109* 89  Recent Results (from the past 240 hour(s))  Surgical pcr screen     Status: None   Collection Time: 03/23/15  3:19 AM  Result Value Ref Range Status   MRSA, PCR NEGATIVE NEGATIVE Final   Staphylococcus aureus NEGATIVE NEGATIVE Final    Comment:        The Xpert SA Assay (FDA approved for NASAL specimens in patients over 1 years of age), is one component of a comprehensive surveillance program.  Test performance has been validated by [facility] for patients greater than or equal to 93 year old. It is not intended to diagnose infection nor to guide or monitor treatment.   Culture, blood (routine x 2)     Status: None   Collection Time: 03/23/15 11:00 AM  Result Value Ref Range Status   Specimen Description BLOOD RIGHT ARM  Final   Special Requests IN PEDIATRIC BOTTLE 4 CC  Final   Culture   Final    NO GROWTH 5 DAYS Performed at [facility]    Report Status 03/28/2015 FINAL  Final  Culture, blood (routine x 2)     Status: None   Collection Time: 03/23/15 11:05 AM  Result Value Ref Range Status   Specimen Description BLOOD RIGHT HAND  Final   Special Requests BOTTLES DRAWN AEROBIC ONLY 5 CC BLUE  Final   Culture   Final    NO GROWTH 5 DAYS Performed at [facility]    Report Status 03/28/2015 FINAL  Final  Influenza virus ag, a+b (DFA)     Status: None   Collection Time: 03/26/15 12:30 PM  Result Value Ref Range Status   Influenza Virus A and B Ag REPORT  Final    Comment: (NOTE) Influenza Virus Type A#B Antigen, DFA SOURCE : NOT SUPPLIED Influenza Type A Ag, DFA        Negative for influenza virus Influenza Type B Ag, DFA        Negative for influenza  virus For maximum sensitivity in the diagnosis of viral infections, a negative or suspected positive result should be confirmed by viral culture. Performed at [facility], blood (routine x 2)     Status: None (Preliminary result)   Collection Time: 03/28/15 11:00 AM  Result Value Ref Range Status   Specimen Description BLOOD LEFT ARM  Final   Special Requests IN PEDIATRIC BOTTLE 2CC  Final   Culture   Final    NO GROWTH 2 DAYS Performed at [facility]    Report Status PENDING  Incomplete  Culture, blood (routine x 2)     Status: None (Preliminary result)   Collection Time: 03/28/15 11:11 AM  Result Value Ref Range Status   Specimen Description BLOOD RIGHT HAND  Final   Special Requests IN PEDIATRIC BOTTLE 3CC  Final   Culture   Final    NO GROWTH 2 DAYS Performed at [facility]    Report Status PENDING  Incomplete  Culture, Urine     Status: None   Collection Time: 03/28/15 11:30 AM  Result Value Ref Range Status   Specimen Description URINE, CATHETERIZED  Final   Special Requests NONE  Final   Culture   Final    NO GROWTH 1 DAY Performed at [facility]    Report Status 03/29/2015 FINAL  Final  Wound culture     Status: None   Collection Time: 03/28/15  2:32 PM  Result Value Ref Range  Status   Specimen Description WOUND FEMORAL HEAD  Final   Special Requests NONE  Final   Gram Stain   Final    NO WBC SEEN NO SQUAMOUS EPITHELIAL CELLS SEEN NO ORGANISMS SEEN Performed at [facility]    Culture   Final    NO GROWTH 2 DAYS Performed at [facility]    Report Status 03/31/2015 FINAL  Final      Studies/Results: Ct Abdomen Pelvis W Contrast  03/29/2015  CLINICAL DATA:  Sepsis. Admitted 1 week ago with an acute right hip fracture. EXAM: CT ABDOMEN AND PELVIS WITH CONTRAST TECHNIQUE: Multidetector CT imaging of the abdomen and pelvis was performed using the standard protocol following bolus administration of  intravenous contrast. CONTRAST:  OMNIPAQUE IOHEXOL 300 MG/ML  SOLN COMPARISON:  Chest CT dated 03/24/2015, pelvis radiograph dated 03/21/2015 and renal ultrasound dated 05/09/2011. FINDINGS: Lower chest: Interval small bilateral pleural effusions. Mild right lower lobe linear atelectasis. Hepatobiliary: Previously noted small gallstones in the gallbladder. No gallbladder wall thickening or pericholecystic fluid. The largest stone measures 5 mm. Normal appearing liver. Pancreas: No mass, inflammatory changes, or other significant abnormality. Spleen: Within normal limits in size and appearance. Adrenals/Urinary Tract: Small left adrenal mass or focal thickening measuring 1.8 x 1.3 cm on image number 25. Normal appearing right adrenal gland. Small right renal cysts. The normal appearing left kidney and ureters. Decompressed urinary bladder with a Foley catheter in place. No urinary tract calculi are seen. Stomach/Bowel: Small number of sigmoid colon diverticula. No evidence of diverticulitis or appendicitis. The appendix is surgically absent by history. Vascular/Lymphatic: Atheromatous arterial calcifications. No enlarged lymph nodes. Reproductive: Surgically absent uterus.  No adnexal masses. Other: None. Musculoskeletal: Interval intramedullary rod and screw fixation of the previously demonstrated subtrochanteric right femur fracture. Right femoral head cysts. Mild bilateral hip degenerative changes. Lumbar and lower thoracic spine degenerative changes. IMPRESSION: 1. No acute abdominal abnormality. 2. Interval small bilateral pleural effusions and mild right lower lobe atelectasis. 3. Small left adrenal adenoma or focal thickening. 4. Cholelithiasis. 5. Mild sigmoid diverticulosis. Electronically Signed   By: Beckie Salts M.D.   On: 03/29/2015 17:29    Medications:  Scheduled: . amLODipine  5 mg Oral Daily  . aspirin EC  325 mg Oral BID  . carvedilol  25 mg Oral BID WC  . cholecalciferol  1,000 Units  Oral Daily  . docusate sodium  100 mg Oral BID  . dorzolamide  1 drop Both Eyes TID  . ferrous sulfate  325 mg Oral TID PC  . FLUoxetine  30 mg Oral Daily  . furosemide  40 mg Oral Daily  . guaiFENesin  1,200 mg Oral BID  . Influenza vac split quadrivalent PF  0.5 mL Intramuscular Tomorrow-1000  . insulin aspart  0-15 Units Subcutaneous TID WC  . levothyroxine  88 mcg Oral QAC breakfast  . magnesium oxide  800 mg Oral Daily  . multivitamin with minerals  1 tablet Oral Daily  . OLANZapine  5 mg Oral Daily  . pantoprazole  40 mg Oral Daily  . rosuvastatin  5 mg Oral Daily   Continuous:  VQQ:[id number] **OR** acetaminophen, albuterol, alum & mag hydroxide-simeth, bisacodyl, fluticasone, HYDROcodone-acetaminophen, HYDROmorphone (DILAUDID) injection, LORazepam, menthol-cetylpyridinium **OR** phenol, methocarbamol **OR** methocarbamol (ROBAXIN)  IV, metoCLOPramide **OR** metoCLOPramide (REGLAN) injection, morphine injection, ondansetron **OR** ondansetron (ZOFRAN) IV, polyethylene glycol, senna-docusate  Assessment/Plan:  Principal Problem:   Leucocytosis Active Problems:   PAF (paroxysmal atrial fibrillation) (HCC)   HTN (  hypertension)   Fracture, subtrochanteric, right femur, closed (HCC)   CKD (chronic kidney disease) stage 3, GFR 30-59 ml/min   Oxygen dependent   CAP (community acquired pneumonia)   Preoperative cardiovascular examination   Acute on chronic diastolic CHF (congestive heart failure), NYHA class 3 (HCC)   AKI (acute kidney injury) (HCC)    Right femur fracture Status post surgery. Patient apparently has some wound drainage. Orthopedics to address. No evidence for infection based on cultures. Patient complaining of right leg pain and pressure. Will obtain Doppler to rule out DVT, consider MRI of the right hip, right thigh to rule out myositis, will check CK Patient currently only on aspirin for DVT prophylaxis,  Persistent leukocytosis Etiology is unclear. No  clear evidence for pneumonia based on symptoms. Influenza PCR is negative. Discussed with Dr. Orvan Falconer today. He recommends discontinuing antibiotics for now. Monitor for 1 more day. If patient remains afebrile with stable WBC she could be discharged to skilled nursing facility.  Acute on chronic diastolic congestive heart failure Now stable. No evidence for acute decompensation currently. Lungs are clear to auscultation. Continue Lasix.  History of proximal atrial fibrillation Stable. Not noted to be on anticoagulation due to falls. CHA2DS2-VASc Score 4. Continue aspirin.  History of hypothyroidism Continue levothyroxine.  Acute renal failure Etiology unclear. Currently resolved.  Hypertension continue Norvasc and Coreg  Constipation-continue Colace, will add MiraLAX   DVT Prophylaxis: On aspirin twice a day    Code Status: Full code  Family Communication: Discussed with the patient  Disposition Plan: Anticipate discharge to tomorrow to SNF if okay with orthopedics     LOS: 9 days   [facility]  [facility] Pager [phone] 03/31/2015, 10:59 AM  If 7PM-7AM, please contact night-coverage at [email or web address], password [facility]

## 2015-03-31 NOTE — Progress Notes (Signed)
Physical Therapy Treatment Patient Details Name: Debra Johnston MRN: [id number] DOB: 07/30/1927 Today's Date: 03/31/2015    History of Present Illness This 82 year old female was admitted R hip fx and is s/p ORIF.  She had been having severe R LE pain for 2 weeks prior to admission.  Pt has a PMH significant for HTN, A-Fib, CHF, and uses home 02, underlying pneumonia most likely.    PT Comments    POD # 8 Pt OOB in recliner via lift with NT.  Dopplers neg so performed R LE TE's mostly AAROM due to pain and weakness.   Pt will need ST Rehab prior to D/C back home to regain prior level of activity.  Follow Up Recommendations  SNF     Equipment Recommendations       Recommendations for Other Services       Precautions / Restrictions Precautions Precautions: Fall Precaution Comments: home O2   2 lts > 1 year Restrictions Weight Bearing Restrictions: Yes RLE Weight Bearing: Partial weight bearing    Mobility  Bed Mobility    pt OOB vis lift sitting in recliner              Transfers                    Ambulation/Gait                 Stairs            Wheelchair Mobility    Modified Rankin (Stroke Patients Only)       Balance                                    Cognition                            Exercises  15 reps B LE AP 10 reps R LE knee presses 10 reps B LE towel squeezes 10 reps R LE HS AAROM pt 10% 10 reps R LE ABd/ADd pt 10% 10 reps R LE SLR pt 5%    General Comments        Pertinent Vitals/Pain      Home Living                      Prior Function            PT Goals (current goals can now be found in the care plan section) Progress towards PT goals: Progressing toward goals    Frequency  Min 3X/week    PT Plan Current plan remains appropriate    Co-evaluation             End of Session   Activity Tolerance: Patient limited by fatigue;Patient limited by  pain Patient left: in chair;with call bell/phone within reach     Time: 1400-1412 PT Time Calculation (min) (ACUTE ONLY): 12 min  Charges:  $Therapeutic Exercise: 8-22 mins                    G Codes:      Felecia Shelling  PTA [facility]  Acute  Rehab Pager      [phone]

## 2015-03-31 NOTE — Progress Notes (Signed)
Pt has a SNF bed at [facility] when stable for d/c.  Cori Razor LCSW [phone]

## 2015-04-01 DIAGNOSIS — N183 Chronic kidney disease, stage 3 (moderate): Secondary | ICD-10-CM

## 2015-04-01 LAB — COMPREHENSIVE METABOLIC PANEL
ALT: 23 U/L (ref 14–54)
AST: 22 U/L (ref 15–41)
Albumin: 2.8 g/dL — ABNORMAL LOW (ref 3.5–5.0)
Alkaline Phosphatase: 55 U/L (ref 38–126)
Anion gap: 9 (ref 5–15)
BUN: 30 mg/dL — ABNORMAL HIGH (ref 6–20)
CALCIUM: 8.6 mg/dL — AB (ref 8.9–10.3)
CHLORIDE: 105 mmol/L (ref 101–111)
CO2: 26 mmol/L (ref 22–32)
Creatinine, Ser: 0.98 mg/dL (ref 0.44–1.00)
GFR, EST AFRICAN AMERICAN: 59 mL/min — AB (ref >60)
GFR, EST NON AFRICAN AMERICAN: 51 mL/min — AB (ref >60)
Glucose, Bld: 105 mg/dL — ABNORMAL HIGH (ref 65–99)
Potassium: 4.1 mmol/L (ref 3.5–5.1)
Sodium: 140 mmol/L (ref 135–145)
TOTAL PROTEIN: 5.4 g/dL — AB (ref 6.5–8.1)
Total Bilirubin: 0.9 mg/dL (ref 0.3–1.2)

## 2015-04-01 LAB — CBC
HCT: 29.4 % — ABNORMAL LOW (ref 36.0–46.0)
Hemoglobin: 9 g/dL — ABNORMAL LOW (ref 12.0–15.0)
MCH: 30.5 pg (ref 26.0–34.0)
MCHC: 30.6 g/dL (ref 30.0–36.0)
MCV: 99.7 fL (ref 78.0–100.0)
PLATELETS: 403 10*3/uL — AB (ref 150–400)
RBC: 2.95 MIL/uL — AB (ref 3.87–5.11)
RDW: 16.2 % — ABNORMAL HIGH (ref 11.5–15.5)
WBC: 13.9 10*3/uL — ABNORMAL HIGH (ref 4.0–10.5)

## 2015-04-01 LAB — GLUCOSE, CAPILLARY
GLUCOSE-CAPILLARY: 120 mg/dL — AB (ref 65–99)
GLUCOSE-CAPILLARY: 98 mg/dL (ref 65–99)

## 2015-04-01 MED ORDER — SENNOSIDES-DOCUSATE SODIUM 8.6-50 MG PO TABS: 1.0000 | ORAL_TABLET | Freq: Every evening | ORAL | Status: DC | PRN

## 2015-04-01 MED ORDER — LORAZEPAM 2 MG PO TABS: 2.0000 mg | ORAL_TABLET | Freq: Three times a day (TID) | ORAL | Status: DC | PRN

## 2015-04-01 MED ORDER — BISACODYL 5 MG PO TBEC: 5.0000 mg | DELAYED_RELEASE_TABLET | Freq: Every day | ORAL | Status: AC | PRN

## 2015-04-01 MED ORDER — ALBUTEROL SULFATE (2.5 MG/3ML) 0.083% IN NEBU: 2.5000 mg | INHALATION_SOLUTION | RESPIRATORY_TRACT | Status: AC | PRN

## 2015-04-01 MED ORDER — HYDROCODONE-ACETAMINOPHEN 5-325 MG PO TABS: 1.0000 | ORAL_TABLET | Freq: Four times a day (QID) | ORAL | Status: DC | PRN

## 2015-04-01 MED ORDER — METHOCARBAMOL 500 MG PO TABS: 500.0000 mg | ORAL_TABLET | Freq: Four times a day (QID) | ORAL | Status: DC | PRN

## 2015-04-01 MED ORDER — GUAIFENESIN ER 600 MG PO TB12: 1200.0000 mg | ORAL_TABLET | Freq: Two times a day (BID) | ORAL | Status: DC

## 2015-04-01 NOTE — Clinical Social Work Placement (Signed)
   CLINICAL SOCIAL WORK PLACEMENT  NOTE  Date:  04/01/2015  Patient Details  Name: Debra Johnston MRN: [id number] Date of Birth: April 24, 1927  Clinical Social Work is seeking post-discharge placement for this patient at the Skilled  Nursing Facility level of care (*CSW will initial, date and re-position this form in  chart as items are completed):  No   Patient/family provided with [facility] Clinical Social Work Department's list of facilities offering this level of care within the geographic area requested by the patient (or if unable, by the patient's family).  Yes   Patient/family informed of their freedom to choose among providers that offer the needed level of care, that participate in Medicare, Medicaid or managed care program needed by the patient, have an available bed and are willing to accept the patient.  No   Patient/family informed of [place]'s ownership interest in [facility] and [facility], as well as of the fact that they are under no obligation to receive care at these facilities.  PASRR submitted to EDS on       PASRR number received on       Existing PASRR number confirmed on 03/24/15     FL2 transmitted to all facilities in geographic area requested by pt/family on 03/24/15     FL2 transmitted to all facilities within larger geographic area on       Patient informed that his/her managed care company has contracts with or will negotiate with certain facilities, including the following:        Yes   Patient/family informed of bed offers received.  Patient chooses bed at [facility]     Physician recommends and patient chooses bed at      Patient to be transferred to [facility] on 04/01/15.  Patient to be transferred to facility by [facility]     Patient family notified on 04/01/15 of transfer.  Name of family member notified:  SPOUSE     PHYSICIAN       Additional Comment: Pt / family are in agreement with d/c to [facility] today. [facility]  transport is required. Medical necessity form completed. D/C Summary sent to SNF for review prior to d/c. Scripts included in d/c packet. # for report provided to [employer]. [employer] provided authorization for SNF placement. Ambulance authorization also requested.   _______________________________________________ Royetta Asal, LCSW  [phone] 04/01/2015, 4:16 PM

## 2015-04-01 NOTE — Discharge Summary (Signed)
Physician Discharge Summary  Debra Johnston MRN: [id number] DOB/AGE: 08-11-1927 82 y.o.  PCP: Harle Battiest, MD   Admit date: 03/21/2015 Discharge date: 04/01/2015  Discharge Diagnoses:     Principal Problem:   Leucocytosis Active Problems:   PAF (paroxysmal atrial fibrillation) (HCC)   HTN (hypertension)   Fracture, subtrochanteric, right femur, closed (HCC)   CKD (chronic kidney disease) stage 3, GFR 30-59 ml/min   Oxygen dependent   CAP (community acquired pneumonia)   Preoperative cardiovascular examination   Acute on chronic diastolic CHF (congestive heart failure), NYHA class 3 (HCC)   AKI (acute kidney injury) ([place])    Follow-up recommendations Follow-up with PCP in 3-5 days , including all  additional recommended appointments as below Follow-up CBC, CMP in 3-5 days and then weekly       Medication List    STOP taking these medications        traMADol 50 MG tablet  Commonly known as:  ULTRAM      TAKE these medications        acetaminophen 500 MG tablet  Commonly known as:  TYLENOL  Take 500 mg by mouth every 6 (six) hours as needed (back pain).     albuterol (2.5 MG/3ML) 0.083% nebulizer solution  Commonly known as:  PROVENTIL  Take 3 mLs (2.5 mg total) by nebulization every 4 (four) hours as needed for wheezing or shortness of breath.     amLODipine 5 MG tablet  Commonly known as:  NORVASC  Take 1 tablet (5 mg total) by mouth daily.     aspirin 325 MG EC tablet  Take 1 tablet (325 mg total) by mouth 2 (two) times daily.     bisacodyl 5 MG EC tablet  Commonly known as:  DULCOLAX  Take 1 tablet (5 mg total) by mouth daily as needed for moderate constipation.     carvedilol 25 MG tablet  Commonly known as:  COREG  Take 25 mg by mouth 2 (two) times daily with a meal.     CRESTOR 5 MG tablet  Generic drug:  rosuvastatin  Take 5 mg by mouth daily.     dorzolamide 2 % ophthalmic solution  Commonly known as:  TRUSOPT  Place 1 drop into both eyes 2  (two) times daily.     ferrous sulfate 325 (65 FE) MG tablet  Take 325 mg by mouth every other day.     FLUoxetine 20 MG capsule  Commonly known as:  PROZAC  Take 30 mg by mouth daily at 12 noon.     fluticasone 50 MCG/ACT nasal spray  Commonly known as:  FLONASE  Place 2 sprays into both nostrils as needed for allergies or rhinitis.     furosemide 40 MG tablet  Commonly known as:  LASIX  Take 1 tablet (40 mg total) by mouth daily.     guaiFENesin 600 MG 12 hr tablet  Commonly known as:  MUCINEX  Take 2 tablets (1,200 mg total) by mouth 2 (two) times daily.     HYDROcodone-acetaminophen 5-325 MG tablet  Commonly known as:  NORCO/VICODIN  Take 1 tablet by mouth every 6 (six) hours as needed for moderate pain.     levothyroxine 88 MCG tablet  Commonly known as:  SYNTHROID  Take 1 tablet (88 mcg total) by mouth daily before breakfast.     LORazepam 2 MG tablet  Commonly known as:  ATIVAN  Take 1 tablet (2 mg total) by mouth every 8 (eight) hours as  needed. anxiety     magnesium oxide 400 MG tablet  Commonly known as:  MAG-OX  Take 800 mg by mouth daily.     methocarbamol 500 MG tablet  Commonly known as:  ROBAXIN  Take 1 tablet (500 mg total) by mouth every 6 (six) hours as needed for muscle spasms.     multivitamin with minerals Tabs tablet  Take 1 tablet by mouth daily.     nitroGLYCERIN 0.4 MG SL tablet  Commonly known as:  NITROSTAT  Place 1 tablet (0.4 mg total) under the tongue every 5 (five) minutes as needed for chest pain.     NON FORMULARY  Place 2-6 L into the nose continuous.     OLANZapine 5 MG tablet  Commonly known as:  ZYPREXA  Take 5 mg by mouth daily.     pantoprazole 40 MG tablet  Commonly known as:  PROTONIX  Take 40 mg by mouth daily. 1 hour before breakfast     potassium chloride SA 20 MEQ tablet  Commonly known as:  K-DUR,KLOR-CON  TAKE 30 MEQ DAILY; (1 AND 1/2 TAB DAILY)     senna-docusate 8.6-50 MG tablet  Commonly known as:   Senokot-S  Take 1 tablet by mouth at bedtime as needed for mild constipation.     VITAMIN D PO  Take 1 tablet by mouth daily.         Discharge Condition: stable   Discharge Instructions       Discharge Instructions    Diet - low sodium heart healthy    Complete by:  As directed      Increase activity slowly    Complete by:  As directed      Partial weight bearing    Complete by:  As directed   % Body Weight:  50  Laterality:  right  Extremity:  Lower           Allergies  Allergen Reactions  . Penicillins Swelling and Rash      Disposition: 01-Home or Self Care   Consults:  ID    Significant Diagnostic Studies:  Dg Chest 1 View  03/22/2015  CLINICAL DATA:  Fall with right hip fracture. EXAM: CHEST 1 VIEW COMPARISON:  03/16/2015 FINDINGS: Unchanged elevation right hemidiaphragm. Unchanged cardiomediastinal contours, heart at the upper limits normal in size. No pulmonary edema, confluent airspace disease, pleural effusion or pneumothorax. No acute osseous abnormalities are seen. IMPRESSION: No acute process. Electronically Signed   By: Jeb Levering M.D.   On: 03/22/2015 00:15   Dg Chest 1 View  03/16/2015  CLINICAL DATA:  Chest pain EXAM: CHEST 1 VIEW COMPARISON:  11/19/2014 FINDINGS: There is elevation of the right diaphragm. There is no focal parenchymal opacity. There is no pleural effusion or pneumothorax. The heart and mediastinal contours are unremarkable. The osseous structures are unremarkable. IMPRESSION: No active disease. Electronically Signed   By: Kathreen Devoid   On: 03/16/2015 13:24   Dg Pelvis 1-2 Views  03/21/2015  CLINICAL DATA:  Pop in right hip going to the bathroom tonight. Initial encounter. EXAM: PELVIS - 1-2 VIEW COMPARISON:  None. FINDINGS: Transverse subtrochanteric right femur fracture with medial displacement. No evidence of pelvic ring fracture or diastasis. Located hips. IMPRESSION: Displaced subtrochanteric right femur fracture.  Electronically Signed   By: Monte Fantasia M.D.   On: 03/21/2015 23:55   Dg Knee 2 Views Right  03/21/2015  CLINICAL DATA:  Patent right leg beginning tonight. Initial encounter. EXAM: RIGHT KNEE -  1-2 VIEW COMPARISON:  06/16/2007 FINDINGS: There is no evidence of fracture, dislocation, or joint effusion. Well-seated total knee arthroplasty. Osteopenia IMPRESSION: No acute finding. Total arthroplasty. Electronically Signed   By: Monte Fantasia M.D.   On: 03/21/2015 23:54   Ct Chest W Contrast  03/24/2015  CLINICAL DATA:  Hypoxia.  Evaluate for pneumonia. EXAM: CT CHEST WITH CONTRAST TECHNIQUE: Multidetector CT imaging of the chest was performed during intravenous contrast administration. CONTRAST:  36m OMNIPAQUE IOHEXOL 300 MG/ML  SOLN COMPARISON:  Chest x-ray 03/21/2015 FINDINGS: Lungs are well inflated and demonstrate very minimal dependent atelectasis and linear atelectasis versus scarring over the right lower lobe. Subtle focal reticulonodular opacification over the posterior lateral aspect of the right middle lobe which may be acute or chronic. Faint 3-4 mm subpleural nodular density over the lateral right upper lobe. Airways are within normal. Mild cardiomegaly. Minimal calcified plaque over the left anterior descending coronary artery. Mild calcified plaque over the thoracic aorta. No evidence of mediastinal, hilar or axillary adenopathy. Remaining mediastinal structures are within normal. Images through the upper abdomen demonstrate evidence of cholelithiasis. Minimal degenerative change of the spine. IMPRESSION: Subtle region of reticulonodular opacification over the posterior lateral right middle lobe which may be acute or chronic. This could be seen in an infectious/atypical infectious or inflammatory process. Recommend followup CT 4-6 weeks. Faint 3-4 mm subpleural nodule over the lateral right upper lobe. Recommend followup CT 1 year. This recommendation follows the consensus statement:  Guidelines for Management of Small Pulmonary Nodules Detected on CT Scans: A Statement from the [employer] published in Radiology 2005; 237:395-400. Online at: hhttps://www.arnold.com/ Mild cardiomegaly.  Minimal atherosclerotic coronary artery disease. Cholelithiasis. Electronically Signed   By: DMarin OlpM.D.   On: 03/24/2015 11:36   Ct Abdomen Pelvis W Contrast  03/29/2015  CLINICAL DATA:  Sepsis. Admitted 1 week ago with an acute right hip fracture. EXAM: CT ABDOMEN AND PELVIS WITH CONTRAST TECHNIQUE: Multidetector CT imaging of the abdomen and pelvis was performed using the standard protocol following bolus administration of intravenous contrast. CONTRAST:  1010mOMNIPAQUE IOHEXOL 300 MG/ML  SOLN COMPARISON:  Chest CT dated 03/24/2015, pelvis radiograph dated 03/21/2015 and renal ultrasound dated 05/09/2011. FINDINGS: Lower chest: Interval small bilateral pleural effusions. Mild right lower lobe linear atelectasis. Hepatobiliary: Previously noted small gallstones in the gallbladder. No gallbladder wall thickening or pericholecystic fluid. The largest stone measures 5 mm. Normal appearing liver. Pancreas: No mass, inflammatory changes, or other significant abnormality. Spleen: Within normal limits in size and appearance. Adrenals/Urinary Tract: Small left adrenal mass or focal thickening measuring 1.8 x 1.3 cm on image number 25. Normal appearing right adrenal gland. Small right renal cysts. The normal appearing left kidney and ureters. Decompressed urinary bladder with a Foley catheter in place. No urinary tract calculi are seen. Stomach/Bowel: Small number of sigmoid colon diverticula. No evidence of diverticulitis or appendicitis. The appendix is surgically absent by history. Vascular/Lymphatic: Atheromatous arterial calcifications. No enlarged lymph nodes. Reproductive: Surgically absent uterus.  No adnexal masses. Other: None. Musculoskeletal: Interval  intramedullary rod and screw fixation of the previously demonstrated subtrochanteric right femur fracture. Right femoral head cysts. Mild bilateral hip degenerative changes. Lumbar and lower thoracic spine degenerative changes. IMPRESSION: 1. No acute abdominal abnormality. 2. Interval small bilateral pleural effusions and mild right lower lobe atelectasis. 3. Small left adrenal adenoma or focal thickening. 4. Cholelithiasis. 5. Mild sigmoid diverticulosis. Electronically Signed   By: StClaudie Revering.D.   On: 03/29/2015 17:29  Dg Chest Port 1 View  03/28/2015  CLINICAL DATA:  Shortness of breath EXAM: PORTABLE CHEST 1 VIEW COMPARISON:  03/25/2014 FINDINGS: Mild patchy left upper lobe opacity, new/ increased, suspicious for pneumonia. Increased interstitial markings.  No frank interstitial edema. Mild eventration of the right hemidiaphragm. No pleural effusion or pneumothorax. Cardiomegaly. IMPRESSION: Mild patchy left upper lobe opacity, new/ increased, suspicious for pneumonia. Electronically Signed   By: Julian Hy M.D.   On: 03/28/2015 10:34   Dg Chest Port 1 View  03/26/2015  CLINICAL DATA:  Hypoxia, shortness of breath. EXAM: PORTABLE CHEST 1 VIEW COMPARISON:  March 21, 2015. FINDINGS: Stable cardiomediastinal silhouette. No pneumothorax or pleural effusion is noted. Elevated right hemidiaphragm is noted. Mildly increased bilateral diffuse interstitial densities are noted concerning for possible pulmonary edema. Bony thorax is unremarkable. IMPRESSION: Mildly increased interstitial densities are noted in both lungs concerning for possible minimal pulmonary edema. Electronically Signed   By: Marijo Conception, M.D.   On: 03/26/2015 10:06   Dg C-arm 61-120 Min-no Report  03/23/2015  CLINICAL DATA: surgery C-ARM 61-120 MINUTES Fluoroscopy was utilized by the requesting physician.  No radiographic interpretation.   Dg Femur, Min 2 Views Right  03/23/2015  CLINICAL DATA:  Right femoral IM nail. EXAM:  RIGHT FEMUR 2 VIEWS; DG C-ARM 61-120 MIN-NO REPORT COMPARISON:  Preoperative radiographs 03/21/2015 FINDINGS: Four fluoroscopic spot images post IM nail insertion demonstrates intra medullary nail in the right femur with proximal and distal locking screws. A trans trochanteric screw is seen. Improved fracture alignment compared to preoperative exam. Fluoroscopy time reported 3 minutes 59 seconds. Exposure 61.94 mGy. IMPRESSION: Fluoroscopic spot images post IM nail insertion fixating subtrochanteric right femur fracture. Improved fracture alignment. Electronically Signed   By: Jeb Levering M.D.   On: 03/23/2015 18:39   Dg Femur, Min 2 Views Right  03/21/2015  CLINICAL DATA:  [place] pop in leg EXAM: RIGHT FEMUR 2 VIEWS COMPARISON:  03/16/2015 FINDINGS: There is been interval development of a subtrochanteric proximal femur fracture. There is medial displacement of the distal fracture fragment with overlap of the fracture fragments by 2.3 cm. IMPRESSION: 1. Acute subtrochanteric proximal femur fracture. Electronically Signed   By: Kerby Moors M.D.   On: 03/21/2015 23:57   Dg Femur, Min 2 Views Right  03/16/2015  CLINICAL DATA:  No known injury.  Pain above the knee. EXAM: RIGHT FEMUR 2 VIEWS COMPARISON:  None. FINDINGS: There is no evidence of fracture or other focal bone lesions. There is mild osteoarthritis of the right hip. There is a right total knee arthroplasty without failure or complication. Soft tissues are unremarkable. IMPRESSION: No acute osseous injury of the right femur. Electronically Signed   By: Kathreen Devoid   On: 03/16/2015 13:25       Filed Weights   03/23/15 0200 03/23/15 2100  Weight: 118.3 kg (260 lb 12.9 oz) 115.2 kg (253 lb 15.5 oz)     Microbiology: Recent Results (from the past 240 hour(s))  Surgical pcr screen     Status: None   Collection Time: 03/23/15  3:19 AM  Result Value Ref Range Status   MRSA, PCR NEGATIVE NEGATIVE Final   Staphylococcus aureus NEGATIVE  NEGATIVE Final    Comment:        The Xpert SA Assay (FDA approved for NASAL specimens in patients over 55 years of age), is one component of a comprehensive surveillance program.  Test performance has been validated by [facility] for patients greater than or  equal to 18 year old. It is not intended to diagnose infection nor to guide or monitor treatment.   Culture, blood (routine x 2)     Status: None   Collection Time: 03/23/15 11:00 AM  Result Value Ref Range Status   Specimen Description BLOOD RIGHT ARM  Final   Special Requests IN PEDIATRIC BOTTLE 4 CC  Final   Culture   Final    NO GROWTH 5 DAYS Performed at [facility]    Report Status 03/28/2015 FINAL  Final  Culture, blood (routine x 2)     Status: None   Collection Time: 03/23/15 11:05 AM  Result Value Ref Range Status   Specimen Description BLOOD RIGHT HAND  Final   Special Requests BOTTLES DRAWN AEROBIC ONLY 5 CC BLUE  Final   Culture   Final    NO GROWTH 5 DAYS Performed at [facility]    Report Status 03/28/2015 FINAL  Final  Influenza virus ag, a+b (DFA)     Status: None   Collection Time: 03/26/15 12:30 PM  Result Value Ref Range Status   Influenza Virus A and B Ag REPORT  Final    Comment: (NOTE) Influenza Virus Type A#B Antigen, DFA SOURCE : NOT SUPPLIED Influenza Type A Ag, DFA        Negative for influenza virus Influenza Type B Ag, DFA        Negative for influenza virus For maximum sensitivity in the diagnosis of viral infections, a negative or suspected positive result should be confirmed by viral culture. Performed at [facility], blood (routine x 2)     Status: None (Preliminary result)   Collection Time: 03/28/15 11:00 AM  Result Value Ref Range Status   Specimen Description BLOOD LEFT ARM  Final   Special Requests IN PEDIATRIC BOTTLE 2CC  Final   Culture   Final    NO GROWTH 3 DAYS Performed at [facility]    Report Status PENDING   Incomplete  Culture, blood (routine x 2)     Status: None (Preliminary result)   Collection Time: 03/28/15 11:11 AM  Result Value Ref Range Status   Specimen Description BLOOD RIGHT HAND  Final   Special Requests IN PEDIATRIC BOTTLE 3CC  Final   Culture   Final    NO GROWTH 3 DAYS Performed at [facility]    Report Status PENDING  Incomplete  Culture, Urine     Status: None   Collection Time: 03/28/15 11:30 AM  Result Value Ref Range Status   Specimen Description URINE, CATHETERIZED  Final   Special Requests NONE  Final   Culture   Final    NO GROWTH 1 DAY Performed at [facility]    Report Status 03/29/2015 FINAL  Final  Wound culture     Status: None   Collection Time: 03/28/15  2:32 PM  Result Value Ref Range Status   Specimen Description WOUND FEMORAL HEAD  Final   Special Requests NONE  Final   Gram Stain   Final    NO WBC SEEN NO SQUAMOUS EPITHELIAL CELLS SEEN NO ORGANISMS SEEN Performed at [facility]    Culture   Final    NO GROWTH 2 DAYS Performed at [facility]    Report Status 03/31/2015 FINAL  Final       Blood Culture    Component Value Date/Time   SDES WOUND FEMORAL HEAD 03/28/2015 1432  SPECREQUEST NONE 03/28/2015 1432   CULT  03/28/2015 1432    NO GROWTH 2 DAYS Performed at [facility] 03/31/2015 FINAL 03/28/2015 1432      Labs: Results for orders placed or performed during the hospital encounter of 03/21/15 (from the past 48 hour(s))  Glucose, capillary     Status: Abnormal   Collection Time: 03/30/15  5:20 PM  Result Value Ref Range   Glucose-Capillary 102 (H) 65 - 99 mg/dL  Glucose, capillary     Status: Abnormal   Collection Time: 03/30/15  9:36 PM  Result Value Ref Range   Glucose-Capillary 109 (H) 65 - 99 mg/dL   Comment 1 Notify RN    Comment 2 Document in Chart   CBC     Status: Abnormal   Collection Time: 03/31/15  4:02 AM  Result Value Ref Range   WBC 15.5 (H) 4.0  - 10.5 K/uL   RBC 2.78 (L) 3.87 - 5.11 MIL/uL   Hemoglobin 8.6 (L) 12.0 - 15.0 g/dL   HCT 27.5 (L) 36.0 - 46.0 %   MCV 98.9 78.0 - 100.0 fL   MCH 30.9 26.0 - 34.0 pg   MCHC 31.3 30.0 - 36.0 g/dL   RDW 15.8 (H) 11.5 - 15.5 %   Platelets 421 (H) 150 - 400 K/uL  Basic metabolic panel     Status: Abnormal   Collection Time: 03/31/15  4:02 AM  Result Value Ref Range   Sodium 140 135 - 145 mmol/L   Potassium 4.2 3.5 - 5.1 mmol/L   Chloride 105 101 - 111 mmol/L   CO2 27 22 - 32 mmol/L   Glucose, Bld 98 65 - 99 mg/dL   BUN 34 (H) 6 - 20 mg/dL   Creatinine, Ser 0.98 0.44 - 1.00 mg/dL   Calcium 8.5 (L) 8.9 - 10.3 mg/dL   GFR calc non Af Amer 51 (L) >60 mL/min   GFR calc Af Amer 59 (L) >60 mL/min    Comment: (NOTE) The eGFR has been calculated using the CKD EPI equation. This calculation has not been validated in all clinical situations. eGFR's persistently <60 mL/min signify possible Chronic Kidney Disease.    Anion gap 8 5 - 15  CK     Status: Abnormal   Collection Time: 03/31/15  4:02 AM  Result Value Ref Range   Total CK 32 (L) 38 - 234 U/L  Glucose, capillary     Status: None   Collection Time: 03/31/15  7:27 AM  Result Value Ref Range   Glucose-Capillary 89 65 - 99 mg/dL  Glucose, capillary     Status: Abnormal   Collection Time: 03/31/15 12:07 PM  Result Value Ref Range   Glucose-Capillary 102 (H) 65 - 99 mg/dL  Glucose, capillary     Status: Abnormal   Collection Time: 03/31/15  8:54 PM  Result Value Ref Range   Glucose-Capillary 124 (H) 65 - 99 mg/dL  CBC     Status: Abnormal   Collection Time: 04/01/15  4:17 AM  Result Value Ref Range   WBC 13.9 (H) 4.0 - 10.5 K/uL   RBC 2.95 (L) 3.87 - 5.11 MIL/uL   Hemoglobin 9.0 (L) 12.0 - 15.0 g/dL   HCT 29.4 (L) 36.0 - 46.0 %   MCV 99.7 78.0 - 100.0 fL   MCH 30.5 26.0 - 34.0 pg   MCHC 30.6 30.0 - 36.0 g/dL   RDW 16.2 (H) 11.5 - 15.5 %   Platelets  403 (H) 150 - 400 K/uL  Comprehensive metabolic panel     Status: Abnormal    Collection Time: 04/01/15  4:17 AM  Result Value Ref Range   Sodium 140 135 - 145 mmol/L   Potassium 4.1 3.5 - 5.1 mmol/L   Chloride 105 101 - 111 mmol/L   CO2 26 22 - 32 mmol/L   Glucose, Bld 105 (H) 65 - 99 mg/dL   BUN 30 (H) 6 - 20 mg/dL   Creatinine, Ser 0.98 0.44 - 1.00 mg/dL   Calcium 8.6 (L) 8.9 - 10.3 mg/dL   Total Protein 5.4 (L) 6.5 - 8.1 g/dL   Albumin 2.8 (L) 3.5 - 5.0 g/dL   AST 22 15 - 41 U/L   ALT 23 14 - 54 U/L   Alkaline Phosphatase 55 38 - 126 U/L   Total Bilirubin 0.9 0.3 - 1.2 mg/dL   GFR calc non Af Amer 51 (L) >60 mL/min   GFR calc Af Amer 59 (L) >60 mL/min    Comment: (NOTE) The eGFR has been calculated using the CKD EPI equation. This calculation has not been validated in all clinical situations. eGFR's persistently <60 mL/min signify possible Chronic Kidney Disease.    Anion gap 9 5 - 15  Glucose, capillary     Status: Abnormal   Collection Time: 04/01/15  7:43 AM  Result Value Ref Range   Glucose-Capillary 120 (H) 65 - 99 mg/dL  Glucose, capillary     Status: None   Collection Time: 04/01/15 11:43 AM  Result Value Ref Range   Glucose-Capillary 98 65 - 99 mg/dL     Lipid Panel  No results found for: CHOL, TRIG, HDL, CHOLHDL, VLDL, LDLCALC, LDLDIRECT   Lab Results  Component Value Date   HGBA1C 6.0* 03/30/2015   HGBA1C 6.0* 02/22/2011     Lab Results  Component Value Date   CREATININE 0.98 04/01/2015     HPI : Debra Johnston is a 82 y.o. female with past medical history of hypertension, hypothyroidism, paroxysmal atrial fibrillation, chronic diastolic CHF on home oxygen, presented with complaints of right leg pain. Evaluation revealed a displaced subtrochanteric proximal right femur fracture.  admitted 1/30   with an acute right hip fracture. She underwent open reduction and internal fixation 2/2 . She has been afebrile and her initial chest x-ray was unremarkable. She was started on prednisone due to wheezing and her white blood cell  count began to go up slightly. She underwent a chest CT scan which showed a small area of reticulonodular infiltrate in her right lung base. She was started on empiric antibiotic therapy with vanc and aztreonam  for possible pneumonia. Her white blood cell count has continued to go up slightly. She remains afebrile.  She  developed some slight nonproductive cough after she was admitted.   checks x-ray showed some increase interstitial infiltrates particularly in the left upper lobe.  ID, orthopedics were consulted during this admission  HOSPITAL COURSE:   Right femur fracture Status post surgery. Patient apparently has some wound drainage. Orthopedics has followed patient surgery.  No evidence for infection based on cultures. Patient complaining of right leg pain and pressure. Negative Doppler no DVT,  Patient currently only on aspirin for DVT prophylaxis,cleared by ortho for DC    Persistent leukocytosis Etiology is unclear. No clear evidence for pneumonia based on symptoms. Influenza PCR is negative. Seen by  Dr. Megan Salon  . He recommended discontinuing antibiotics on 2/8 . Patient remained afebrile with  stable WBC , will  be discharged to skilled nursing facility today .  Acute on chronic diastolic congestive heart failure Now stable. No evidence for acute decompensation currently. Lungs are clear to auscultation. Continue Lasix.  History of proximal atrial fibrillation Stable. Not noted to be on anticoagulation due to falls. CHA2DS2-VASc Score 4. Continue aspirin.  History of hypothyroidism Continue levothyroxine.  Acute renal failure Etiology unclear. Currently resolved.  Hypertension continue Norvasc and Coreg  Constipation-continue Colace, will add MiraLAX    Discharge Exam: *   Blood pressure 138/46, pulse 72, temperature 98.4 F (36.9 C), temperature source Oral, resp. rate 16, height 5' 10"  (1.778 m), weight 115.2 kg (253 lb 15.5 oz), SpO2 95 %.  General appearance: alert,  cooperative, appears stated age and no distress Resp: clear to auscultation bilaterally Cardio: regular rate and rhythm, S1, S2 normal, no murmur, click, rub or gallop GI: soft, non-tender; bowel sounds normal; no masses, no organomegaly Extremities: Dressing noted over the right thigh. Per nurse, there has been some drainage from the wound. Neurologic: Awake and alert. Oriented 3. No focal neurological deficits.    Follow-up Information    Follow up with Mauri Pole, MD. Schedule an appointment as soon as possible for a visit in 2 weeks.   Specialty:  Orthopedic Surgery   Contact information:   [address] [place] [address] [phone]       Follow up with [facility].   Specialty:  [place] information:   [address] [place] [phone]      Follow up with [facility], MD. Schedule an appointment as soon as possible for a visit in 3 days.   Specialty:  Obstetrics and Gynecology   Contact information:   [place] [address] [phone]       Signed: Reyne Dumas 04/01/2015, 12:28 PM        Time spent >45 mins

## 2015-04-01 NOTE — Progress Notes (Signed)
     Subjective: 9 Days Post-Op Procedure(s) (LRB): INTRAMEDULLARY (IM) NAIL FEMORAL (Right)   Patient reports pain as moderate, pain controlled. No events throughout the night.  Continue changing of the dressing do to drainage.  Doppler yesterday because of LE tightness revealed NO signs of DVT.  Orthopaedically stable.  Objective:   VITALS:   Filed Vitals:   03/31/15 2251 04/01/15 0455  BP: 121/59 138/46  Pulse: 66 72  Temp: 98.5 F (36.9 C) 98.4 F (36.9 C)  Resp: 16 16    Dorsiflexion/Plantar flexion intact Incision: scant drainage, minimal drainage. Patient stated that they changed the dressing last night. No cellulitis present Compartment soft  LABS  Recent Labs  03/30/15 0445 03/31/15 0402 04/01/15 0417  HGB 8.3* 8.6* 9.0*  HCT 25.4* 27.5* 29.4*  WBC 16.1* 15.5* 13.9*  PLT 366 421* 403*     Recent Labs  03/30/15 0445 03/31/15 0402 04/01/15 0417  NA 140 140 140  K 4.3 4.2 4.1  BUN 36* 34* 30*  CREATININE 0.98 0.98 0.98  GLUCOSE 95 98 105*     Assessment/Plan: 9 Days Post-Op Procedure(s) (LRB): INTRAMEDULLARY (IM) NAIL FEMORAL (Right) Up with therapy Discharge to SNF eventually, when ready medically  Ortho recommendations:  ASA 325 mg bid for 4 weeks for anticoagulation, unless other medically indicated.  Norco for pain management (Rx written).  MiraLax and Colace for constipation  Iron 325 mg tid for 2-3 weeks as able to tolerate do to constipation.   PWB 50% on the right leg.  Dressing to remain in place until follow in clinic in 2 weeks.  Dressing is waterproof and may shower with it in place.  Follow up in 2 weeks at [facility]. Follow up with OLIN,Leighton Luster D in 2 weeks.  Contact information:  [facility] [address], [address] [place] [address] [phone]             Anastasio Auerbach. Patrina Andreas   PAC  04/01/2015, 7:11 AM

## 2015-04-01 NOTE — Progress Notes (Signed)
Attempted to call report to [facility]. 

## 2015-04-01 NOTE — Progress Notes (Signed)
Pt has a SNF bed at [facility] today if pt is stable for d/c. CSW will assist with d/c planning to SNF.  Cori Razor LCSW [phone]

## 2015-04-02 LAB — CULTURE, BLOOD (ROUTINE X 2)
CULTURE: NO GROWTH
Culture: NO GROWTH

## 2015-04-04 ENCOUNTER — Non-Acute Institutional Stay (SKILLED_NURSING_FACILITY): Payer: Medicare Other | Admitting: Internal Medicine

## 2015-04-04 ENCOUNTER — Encounter: Payer: Self-pay | Admitting: Internal Medicine

## 2015-04-04 DIAGNOSIS — J189 Pneumonia, unspecified organism: Secondary | ICD-10-CM | POA: Diagnosis not present

## 2015-04-04 DIAGNOSIS — K5900 Constipation, unspecified: Secondary | ICD-10-CM

## 2015-04-04 DIAGNOSIS — D62 Acute posthemorrhagic anemia: Secondary | ICD-10-CM

## 2015-04-04 DIAGNOSIS — E038 Other specified hypothyroidism: Secondary | ICD-10-CM

## 2015-04-04 DIAGNOSIS — D72829 Elevated white blood cell count, unspecified: Secondary | ICD-10-CM | POA: Diagnosis not present

## 2015-04-04 DIAGNOSIS — R300 Dysuria: Secondary | ICD-10-CM | POA: Diagnosis not present

## 2015-04-04 DIAGNOSIS — S7221XS Displaced subtrochanteric fracture of right femur, sequela: Secondary | ICD-10-CM

## 2015-04-04 DIAGNOSIS — I5032 Chronic diastolic (congestive) heart failure: Secondary | ICD-10-CM

## 2015-04-04 DIAGNOSIS — R195 Other fecal abnormalities: Secondary | ICD-10-CM

## 2015-04-04 DIAGNOSIS — R339 Retention of urine, unspecified: Secondary | ICD-10-CM

## 2015-04-04 DIAGNOSIS — N183 Chronic kidney disease, stage 3 unspecified: Secondary | ICD-10-CM

## 2015-04-04 DIAGNOSIS — R5381 Other malaise: Secondary | ICD-10-CM | POA: Diagnosis not present

## 2015-04-04 DIAGNOSIS — I10 Essential (primary) hypertension: Secondary | ICD-10-CM

## 2015-04-04 DIAGNOSIS — F411 Generalized anxiety disorder: Secondary | ICD-10-CM | POA: Diagnosis not present

## 2015-04-04 NOTE — Progress Notes (Signed)
Patient ID: HONOR FRISON, female   DOB: 27-Apr-1927, 82 y.o.   MRN: [id number]    LOCATION: [name]  PCP: Donovan Kail, MD   Code Status: Full Code  Goals of care: Advanced Directive information Advanced Directives 03/22/2015  Does patient have an advance directive? Yes  Copy of advanced directive(s) in chart? No - copy requested     Extended Emergency Contact Information Primary Emergency Contact: Labine,Leroy Address: [address]          [place], [place] [address] [place] Home Phone: [phone] Mobile Phone: [phone] Relation: Spouse   Allergies  Allergen Reactions  . Penicillins Swelling and Rash    Chief Complaint  Patient presents with  . New Admit To SNF    New Admission     HPI:  Patient is a 82 y.o. female seen today for short term rehabilitation post hospital admission from 03/21/15-04/01/15 with right hip fracture. She underwent ORIF on 03/24/15. She was also treated for pneumonia and leukocytosis with antibiotics. She has PMH of HTN, afib, CHF, hypothyroidism among others. She is seen in her room today. She complaints of discomfort with urination. On chart review, she underwent I/O cath on 04/03/15 for urinary retention, had > 600 cc urine output and now has a foley catheter. Her pain is under control with current pain regimen. She would like her anxiolytic scheduled at she was taking ativan 2 mg bid at home. She is on chronic oxygen. Her appetite has been poor and she has occasional nausea. She feels tired.  Review of Systems:  Constitutional: Negative for fever, chills, diaphoresis.  HENT: Negative for headache, congestion, nasal discharge Eyes: Negative for blurred vision, double vision and discharge.  Respiratory: Negative for wheezing.  positive for cough but unable to bring out phlegm.  Cardiovascular: Negative for chest pain, palpitations.  Gastrointestinal: Negative for heartburn, vomiting, abdominal pain. Had a bowel movement  this am and had loose stool Genitourinary: Negative for dysuria. Musculoskeletal: Negative for back pain, fall in the facility Skin: Negative for itching, rash.  Neurological: Negative for dizziness, tingling. Psychiatric/Behavioral: Negative for depression   Past Medical History  Diagnosis Date  . GERD (gastroesophageal reflux disease)   . Glaucoma   . Osteoarthritis   . Hyperlipidemia   . Anxiety   . Depression   . Bronchitis 02/22/2011  . Chronic pain   . Hypothyroidism   . DOE (dyspnea on exertion)     chronic   . CKD (chronic kidney disease) stage 4, GFR 15-29 ml/min (HCC)   . Physical deconditioning   . Falls   . PAF (paroxysmal atrial fibrillation) (HCC)     not felt to be a candidate for coumadin  . Hypertension   . CHF (congestive heart failure) (HCC)     felt to have diastolic dysfunction with normal EF at 60%  . Fibromyalgia     with chronic weakness   Past Surgical History  Procedure Laterality Date  . Hemorrhoid surgery    . Laparoscopic hysterectomy    . Knee surgery      bilateral  . Cesarean section    . Transthoracic echocardiogram  02/2011    EF 60% with diastolic dysfunction, high filling pressures  . Appendectomy      as a teenage  . Cholecystectomy      pt denies having cholecystectomy  . Femur im nail Right 03/23/2015    Procedure: INTRAMEDULLARY (IM) NAIL FEMORAL;  Surgeon: Durene Romans, MD;  Location: [facility];  Service: Orthopedics;  Laterality: Right;   Social History:   reports that she has never smoked. She has never used smokeless tobacco. She reports that she does not drink alcohol or use illicit drugs.  Family History  Problem Relation Age of Onset  . Heart failure Mother   . Stroke Father   . Lymphoma Brother   . Cancer Brother   . Heart attack Mother     Medications:   Medication List       This list is accurate as of: 04/04/15  9:26 AM.  Always use your most recent med list.               acetaminophen 500 MG tablet    Commonly known as:  TYLENOL  Take 500 mg by mouth every 6 (six) hours as needed (back pain).     albuterol (2.5 MG/3ML) 0.083% nebulizer solution  Commonly known as:  PROVENTIL  Take 3 mLs (2.5 mg total) by nebulization every 4 (four) hours as needed for wheezing or shortness of breath.     amLODipine 5 MG tablet  Commonly known as:  NORVASC  Take 1 tablet (5 mg total) by mouth daily.     aspirin 325 MG EC tablet  Take 1 tablet (325 mg total) by mouth 2 (two) times daily.     bisacodyl 5 MG EC tablet  Commonly known as:  DULCOLAX  Take 1 tablet (5 mg total) by mouth daily as needed for moderate constipation.     carvedilol 25 MG tablet  Commonly known as:  COREG  Take 25 mg by mouth 2 (two) times daily with a meal.     CRESTOR 5 MG tablet  Generic drug:  rosuvastatin  Take 5 mg by mouth daily.     dorzolamide 2 % ophthalmic solution  Commonly known as:  TRUSOPT  Place 1 drop into both eyes 2 (two) times daily.     dorzolamide 2 % ophthalmic solution  Commonly known as:  TRUSOPT  Place 1 drop into both eyes 2 (two) times daily.     ferrous sulfate 325 (65 FE) MG tablet  Take 325 mg by mouth every other day.     FLUoxetine 20 MG capsule  Commonly known as:  PROZAC  Take 30 mg by mouth daily at 12 noon.     fluticasone 50 MCG/ACT nasal spray  Commonly known as:  FLONASE  Place 2 sprays into both nostrils 2 (two) times daily as needed for allergies or rhinitis.     furosemide 40 MG tablet  Commonly known as:  LASIX  Take 1 tablet (40 mg total) by mouth daily.     guaiFENesin 600 MG 12 hr tablet  Commonly known as:  MUCINEX  Take 2 tablets (1,200 mg total) by mouth 2 (two) times daily.     HYDROcodone-acetaminophen 5-325 MG tablet  Commonly known as:  NORCO/VICODIN  Take 1 tablet by mouth every 6 (six) hours as needed for moderate pain.     levothyroxine 88 MCG tablet  Commonly known as:  SYNTHROID  Take 1 tablet (88 mcg total) by mouth daily before breakfast.      LORazepam 2 MG tablet  Commonly known as:  ATIVAN  Take 1 tablet (2 mg total) by mouth every 8 (eight) hours as needed. anxiety     magnesium oxide 400 MG tablet  Commonly known as:  MAG-OX  Take 800 mg by mouth daily.     methocarbamol 500 MG tablet  Commonly known as:  ROBAXIN  Take 1 tablet (500 mg total) by mouth every 6 (six) hours as needed for muscle spasms.     multivitamin with minerals Tabs tablet  Take 1 tablet by mouth daily.     nitroGLYCERIN 0.4 MG SL tablet  Commonly known as:  NITROSTAT  Place 0.4 mg under the tongue every 5 (five) minutes as needed for chest pain. If pain is unrelived after 3 doses, call MD     nitroGLYCERIN 0.4 MG SL tablet  Commonly known as:  NITROSTAT  Place 1 tablet (0.4 mg total) under the tongue every 5 (five) minutes as needed for chest pain.     NON FORMULARY  Place 2-6 L into the nose continuous.     OLANZapine 5 MG tablet  Commonly known as:  ZYPREXA  Take 5 mg by mouth daily.     pantoprazole 40 MG tablet  Commonly known as:  PROTONIX  Take 40 mg by mouth daily. 1 hour before breakfast     potassium chloride SA 20 MEQ tablet  Commonly known as:  K-DUR,KLOR-CON  Take 20 mEq by mouth daily. Reported on 04/04/2015     senna-docusate 8.6-50 MG tablet  Commonly known as:  Senokot-S  Take 1 tablet by mouth at bedtime as needed for mild constipation.     VITAMIN D PO  Take 1 tablet by mouth daily.        Immunizations: Immunization History  Administered Date(s) Administered  . Influenza Split 11/20/2010  . Influenza,inj,Quad PF,36+ Mos 01/02/2013  . PPD Test 03/23/2015  . Pneumococcal Polysaccharide-23 02/23/2011     Physical Exam: Filed Vitals:   04/04/15 0906  BP: 129/49  Pulse: 66  Temp: 98.7 F (37.1 C)  TempSrc: Oral  Resp: 24  Height:  (1.778 m)  SpO2: 93%    General- elderly female, obese, in no acute distress Head- normocephalic, atraumatic Nose- no maxillary or frontal sinus tenderness,  no nasal discharge Throat- moist mucus membrane Eyes- PERRLA, EOMI, no pallor, no icterus, no discharge, normal conjunctiva, normal sclera Neck- no cervical lymphadenopathy Cardiovascular- normal s1,s2, no murmurs, no leg edema Respiratory- decreased air entry to lung bases, no wheeze, no rhonchi, no crackles, no use of accessory muscles Abdomen- bowel sounds present, soft, non tender Musculoskeletal- able to move all 4 extremities, generalized weakness, limited right leg ROM Neurological- alert and oriented to person, place and time Skin- warm and dry, right hip surgical incision 2- both with staples and healing well Psychiatry- normal mood and affect    Labs reviewed: Basic Metabolic Panel:  Recent Labs  60/45/40 0445 03/31/15 0402 04/01/15 0417  NA 140 140 140  K 4.3 4.2 4.1  CL 105 105 105  CO2 GLUCOSE 95 98 105*  BUN 36* 34* 30*  CREATININE 0.98 0.98 0.98  CALCIUM 8.4* 8.5* 8.6*   Liver Function Tests:  Recent Labs  03/29/15 1226 03/30/15 0445 04/01/15 0417  AST ALT 33 28 23  ALKPHOS 51 47 55  BILITOT 0.8 0.7 0.9  PROT 5.7* 5.1* 5.4*  ALBUMIN 2.9* 2.7* 2.8*   No results for input(s): LIPASE, AMYLASE in the last 8760 hours. No results for input(s): AMMONIA in the last 8760 hours. CBC:  Recent Labs  03/28/15 0517 03/29/15 1226 03/30/15 0445 03/31/15 0402 04/01/15 0417  WBC 16.4* 16.9* 16.1* 15.5* 13.9*  NEUTROABS 11.1* 12.4* 11.0*  --   --   HGB 9.0* 8.9* 8.3* 8.6*  9.0*  HCT 28.5* 28.2* 25.4* 27.5* 29.4*  MCV 97.6 97.9 95.5 98.9 99.7  PLT 399 429* 366 421* 403*   Cardiac Enzymes:  Recent Labs  03/31/15 0402  CKTOTAL 32*   BNP: Invalid input(s): POCBNP CBG:  Recent Labs  03/31/15 2054 04/01/15 0743 04/01/15 1143  GLUCAP 124* 120* 98    Radiological Exams: Dg Chest 1 View  03/22/2015  CLINICAL DATA:  Fall with right hip fracture. EXAM: CHEST 1 VIEW COMPARISON:  03/16/2015 FINDINGS: Unchanged elevation right  hemidiaphragm. Unchanged cardiomediastinal contours, heart at the upper limits normal in size. No pulmonary edema, confluent airspace disease, pleural effusion or pneumothorax. No acute osseous abnormalities are seen. IMPRESSION: No acute process. Electronically Signed   By: Rubye Oaks M.D.   On: 03/22/2015 00:15   Dg Chest 1 View  03/16/2015  CLINICAL DATA:  Chest pain EXAM: CHEST 1 VIEW COMPARISON:  11/19/2014 FINDINGS: There is elevation of the right diaphragm. There is no focal parenchymal opacity. There is no pleural effusion or pneumothorax. The heart and mediastinal contours are unremarkable. The osseous structures are unremarkable. IMPRESSION: No active disease. Electronically Signed   By: Elige Ko   On: 03/16/2015 13:24   Dg Pelvis 1-2 Views  03/21/2015  CLINICAL DATA:  Pop in right hip going to the bathroom tonight. Initial encounter. EXAM: PELVIS - 1-2 VIEW COMPARISON:  None. FINDINGS: Transverse subtrochanteric right femur fracture with medial displacement. No evidence of pelvic ring fracture or diastasis. Located hips. IMPRESSION: Displaced subtrochanteric right femur fracture. Electronically Signed   By: Marnee Spring M.D.   On: 03/21/2015 23:55   Dg Knee 2 Views Right  03/21/2015  CLINICAL DATA:  Patent right leg beginning tonight. Initial encounter. EXAM: RIGHT KNEE - 1-2 VIEW COMPARISON:  06/16/2007 FINDINGS: There is no evidence of fracture, dislocation, or joint effusion. Well-seated total knee arthroplasty. Osteopenia IMPRESSION: No acute finding. Total arthroplasty. Electronically Signed   By: Marnee Spring M.D.   On: 03/21/2015 23:54   Ct Chest W Contrast  03/24/2015  CLINICAL DATA:  Hypoxia.  Evaluate for pneumonia. EXAM: CT CHEST WITH CONTRAST TECHNIQUE: Multidetector CT imaging of the chest was performed during intravenous contrast administration. CONTRAST:  75mL OMNIPAQUE IOHEXOL 300 MG/ML  SOLN COMPARISON:  Chest x-ray 03/21/2015 FINDINGS: Lungs are well inflated  and demonstrate very minimal dependent atelectasis and linear atelectasis versus scarring over the right lower lobe. Subtle focal reticulonodular opacification over the posterior lateral aspect of the right middle lobe which may be acute or chronic. Faint 3-4 mm subpleural nodular density over the lateral right upper lobe. Airways are within normal. Mild cardiomegaly. Minimal calcified plaque over the left anterior descending coronary artery. Mild calcified plaque over the thoracic aorta. No evidence of mediastinal, hilar or axillary adenopathy. Remaining mediastinal structures are within normal. Images through the upper abdomen demonstrate evidence of cholelithiasis. Minimal degenerative change of the spine. IMPRESSION: Subtle region of reticulonodular opacification over the posterior lateral right middle lobe which may be acute or chronic. This could be seen in an infectious/atypical infectious or inflammatory process. Recommend followup CT 4-6 weeks. Faint 3-4 mm subpleural nodule over the lateral right upper lobe. Recommend followup CT 1 year. This recommendation follows the consensus statement: Guidelines for Management of Small Pulmonary Nodules Detected on CT Scans: A Statement from the Fleischner Society as published in Radiology 2005; 237:395-400. Online at: DietDisorder.cz. Mild cardiomegaly.  Minimal atherosclerotic coronary artery disease. Cholelithiasis. Electronically Signed   By: Elberta Fortis M.D.   On:  03/24/2015 11:36   Ct Abdomen Pelvis W Contrast  03/29/2015  CLINICAL DATA:  Sepsis. Admitted 1 week ago with an acute right hip fracture. EXAM: CT ABDOMEN AND PELVIS WITH CONTRAST TECHNIQUE: Multidetector CT imaging of the abdomen and pelvis was performed using the standard protocol following bolus administration of intravenous contrast. CONTRAST:  OMNIPAQUE IOHEXOL 300 MG/ML  SOLN COMPARISON:  Chest CT dated 03/24/2015, pelvis radiograph dated  03/21/2015 and renal ultrasound dated 05/09/2011. FINDINGS: Lower chest: Interval small bilateral pleural effusions. Mild right lower lobe linear atelectasis. Hepatobiliary: Previously noted small gallstones in the gallbladder. No gallbladder wall thickening or pericholecystic fluid. The largest stone measures 5 mm. Normal appearing liver. Pancreas: No mass, inflammatory changes, or other significant abnormality. Spleen: Within normal limits in size and appearance. Adrenals/Urinary Tract: Small left adrenal mass or focal thickening measuring 1.8 x 1.3 cm on image number 25. Normal appearing right adrenal gland. Small right renal cysts. The normal appearing left kidney and ureters. Decompressed urinary bladder with a Foley catheter in place. No urinary tract calculi are seen. Stomach/Bowel: Small number of sigmoid colon diverticula. No evidence of diverticulitis or appendicitis. The appendix is surgically absent by history. Vascular/Lymphatic: Atheromatous arterial calcifications. No enlarged lymph nodes. Reproductive: Surgically absent uterus.  No adnexal masses. Other: None. Musculoskeletal: Interval intramedullary rod and screw fixation of the previously demonstrated subtrochanteric right femur fracture. Right femoral head cysts. Mild bilateral hip degenerative changes. Lumbar and lower thoracic spine degenerative changes. IMPRESSION: 1. No acute abdominal abnormality. 2. Interval small bilateral pleural effusions and mild right lower lobe atelectasis. 3. Small left adrenal adenoma or focal thickening. 4. Cholelithiasis. 5. Mild sigmoid diverticulosis. Electronically Signed   By: Beckie Salts M.D.   On: 03/29/2015 17:29   Dg Chest Port 1 View  03/28/2015  CLINICAL DATA:  Shortness of breath EXAM: PORTABLE CHEST 1 VIEW COMPARISON:  03/25/2014 FINDINGS: Mild patchy left upper lobe opacity, new/ increased, suspicious for pneumonia. Increased interstitial markings.  No frank interstitial edema. Mild eventration of  the right hemidiaphragm. No pleural effusion or pneumothorax. Cardiomegaly. IMPRESSION: Mild patchy left upper lobe opacity, new/ increased, suspicious for pneumonia. Electronically Signed   By: Charline Bills M.D.   On: 03/28/2015 10:34   Dg Chest Port 1 View  03/26/2015  CLINICAL DATA:  Hypoxia, shortness of breath. EXAM: PORTABLE CHEST 1 VIEW COMPARISON:  March 21, 2015. FINDINGS: Stable cardiomediastinal silhouette. No pneumothorax or pleural effusion is noted. Elevated right hemidiaphragm is noted. Mildly increased bilateral diffuse interstitial densities are noted concerning for possible pulmonary edema. Bony thorax is unremarkable. IMPRESSION: Mildly increased interstitial densities are noted in both lungs concerning for possible minimal pulmonary edema. Electronically Signed   By: Lupita Raider, M.D.   On: 03/26/2015 10:06   Dg C-arm 61-120 Min-no Report  03/23/2015  CLINICAL DATA: surgery C-ARM 61-120 MINUTES Fluoroscopy was utilized by the requesting physician.  No radiographic interpretation.   Dg Femur, Min 2 Views Right  03/23/2015  CLINICAL DATA:  Right femoral IM nail. EXAM: RIGHT FEMUR 2 VIEWS; DG C-ARM 61-120 MIN-NO REPORT COMPARISON:  Preoperative radiographs 03/21/2015 FINDINGS: Four fluoroscopic spot images post IM nail insertion demonstrates intra medullary nail in the right femur with proximal and distal locking screws. A trans trochanteric screw is seen. Improved fracture alignment compared to preoperative exam. Fluoroscopy time reported 3 minutes 59 seconds. Exposure 61.94 mGy. IMPRESSION: Fluoroscopic spot images post IM nail insertion fixating subtrochanteric right femur fracture. Improved fracture alignment. Electronically Signed   By: Shawna Orleans  Ehinger M.D.   On: 03/23/2015 18:39   Dg Femur, Min 2 Views Right  03/21/2015  CLINICAL DATA:  Larey Seat pop in leg EXAM: RIGHT FEMUR 2 VIEWS COMPARISON:  03/16/2015 FINDINGS: There is been interval development of a subtrochanteric  proximal femur fracture. There is medial displacement of the distal fracture fragment with overlap of the fracture fragments by 2.3 cm. IMPRESSION: 1. Acute subtrochanteric proximal femur fracture. Electronically Signed   By: Signa Kell M.D.   On: 03/21/2015 23:57   Dg Femur, Min 2 Views Right  03/16/2015  CLINICAL DATA:  No known injury.  Pain above the knee. EXAM: RIGHT FEMUR 2 VIEWS COMPARISON:  None. FINDINGS: There is no evidence of fracture or other focal bone lesions. There is mild osteoarthritis of the right hip. There is a right total knee arthroplasty without failure or complication. Soft tissues are unremarkable. IMPRESSION: No acute osseous injury of the right femur. Electronically Signed   By: Elige Ko   On: 03/16/2015 13:25    Assessment/Plan  Physical deconditioning Will have her work with physical therapy and occupational therapy team to help with gait training and muscle strengthening exercises.fall precautions. Skin care. Encourage to be out of bed.   Right hip fracture S/p ORIF. Will have patient work with PT/OT as tolerated to regain strength and restore function.  Fall precautions are in place. Has f/u with orthopedics. Partial weight bearing to RLE. Continue incision care. Continue aspirin 325 mg bid for dvt prophylaxis. Continue norco 5-325 mg q6h prn pain. Continue robaxin 500 mg q6h prn muscle spasm. Continue vitamin d supplement  Pneumonia Antibiotic completed. Continue o2 and bronchodilator for now. Continue mucinex. Encourage to use incentive spirometer  Leukocytosis Completed antibiotics of pneumonia, afebrile, check cbc  Blood loss anemia Post op with blood loss. Continue feso4 qod and check cbc  Dysuria Has foley in place, send u/a with c/s  Urinary retention Acute, has foley in place and has good urine output. Attempt voiding trial in am and if fails, get urology consult.  HTN Monitor BP bid, continue norvasc 5 mg daily, coreg 25 mg bid with  holding parameter. Check bmp  CHF Continue coreg, lasix. Check bmp. Monitor weight 3 times a week. Continue kcl supplement  Constipation On prn dulcolax, prn senokot s and prn miralax  Loose stool Likely antibiotic related with her being on antibiotic recently for pneumonia, start florastor x 1 week. Check bmp and mg level. Continue magnesium oxide  GAD Continue prozac, zyprexa. change ativan to 2 mg bid and give additional 2 mg at noon if needed.   Hypothyroidism Continue synthroid  gerd Continue protonix for now  ckd stage 3 Monitor bmp   Goals of care: short term rehabilitation   Labs/tests ordered: cbc with diff, cmp, mg  Family/ staff Communication: reviewed care plan with patient and nursing supervisor    [name], MD Internal Medicine [facility] [address] [place], [place] [address] Cell Phone (Monday-Friday 8 am - 5 pm): [phone] On Call: [phone] and follow prompts after 5 pm and on weekends Office Phone: [phone] Office Fax: [phone]

## 2015-04-05 LAB — CBC AND DIFFERENTIAL
HCT: 30 % — AB (ref 36–46)
Hemoglobin: 8.6 g/dL — AB (ref 12.0–16.0)
Platelets: 404 10*3/uL — AB (ref 150–399)
WBC: 11.2 10*3/mL

## 2015-04-05 LAB — BASIC METABOLIC PANEL
BUN: 11 mg/dL (ref 4–21)
Creatinine: 0.8 mg/dL (ref 0.5–1.1)
Glucose: 110 mg/dL
Potassium: 4.4 mmol/L (ref 3.4–5.3)
SODIUM: 143 mmol/L (ref 137–147)

## 2015-04-06 ENCOUNTER — Non-Acute Institutional Stay (SKILLED_NURSING_FACILITY): Payer: Medicare Other | Admitting: Family

## 2015-04-06 DIAGNOSIS — E46 Unspecified protein-calorie malnutrition: Secondary | ICD-10-CM | POA: Diagnosis not present

## 2015-04-06 DIAGNOSIS — D72829 Elevated white blood cell count, unspecified: Secondary | ICD-10-CM

## 2015-04-06 DIAGNOSIS — D649 Anemia, unspecified: Secondary | ICD-10-CM

## 2015-04-06 NOTE — Progress Notes (Signed)
Patient ID: Debra Johnston, female   DOB: 04/26/1927, 82 y.o.   MRN: [id number]  Location:  [facility]  Provider:  [name], MD   Donovan Kail, MD  Code Status:  Full Code  Goals of care: Advanced Directive information Advanced Directives 03/22/2015  Does patient have an advance directive? Yes  Copy of advanced directive(s) in chart? No - copy requested     Chief Complaint  Patient presents with  . Acute Visit    HPI:  Pt is a 82 y.o. female seen today at [facility] for lab review.She is here for short term rehabilitation post hospital admission from 03/21/15-04/01/15 with right hip fracture. She underwent ORIF on 03/24/15. She was also treated for pneumonia and leukocytosis with antibiotics. She has PMH of HTN, Afib, CHF, Hypothyroidism, GERD, OA  among others. She is seen in her room today. Her recent lab WBC 11.2 , Hgb/HCT  8.6 / 29.6, MCV 102.8(04/05/2015) She also had a Total Protein 5.1 and ALb 2.59.She states she had not been eating well but her appetite has now improved. She denies any dizziness, headache, fainting. She also denies any bleeding, Hematuria or blood in the stool. She is currently on Ferrous sulfate QOD.    Review of Systems  Constitutional: Negative for fever, chills and malaise/fatigue.  HENT: Negative.   Eyes: Negative.   Respiratory: Negative for cough, sputum production, shortness of breath and wheezing.   Cardiovascular: Negative.   Gastrointestinal: Negative for heartburn, nausea, vomiting, abdominal pain, diarrhea, constipation, blood in stool and melena.  Genitourinary: Negative for dysuria, urgency, frequency, hematuria and flank pain.       Foley Catheter   Musculoskeletal: Negative.   Skin: Negative.   Neurological: Negative for dizziness.  Endo/Heme/Allergies: Negative.   Psychiatric/Behavioral: Negative.     Past Medical History  Diagnosis Date  . GERD (gastroesophageal reflux disease)   .  Glaucoma   . Osteoarthritis   . Hyperlipidemia   . Anxiety   . Depression   . Bronchitis 02/22/2011  . Chronic pain   . Hypothyroidism   . DOE (dyspnea on exertion)     chronic   . CKD (chronic kidney disease) stage 4, GFR 15-29 ml/min (HCC)   . Physical deconditioning   . Falls   . PAF (paroxysmal atrial fibrillation) (HCC)     not felt to be a candidate for coumadin  . Hypertension   . CHF (congestive heart failure) (HCC)     felt to have diastolic dysfunction with normal EF at 60%  . Fibromyalgia     with chronic weakness   Past Surgical History  Procedure Laterality Date  . Hemorrhoid surgery    . Laparoscopic hysterectomy    . Knee surgery      bilateral  . Cesarean section    . Transthoracic echocardiogram  02/2011    EF 60% with diastolic dysfunction, high filling pressures  . Appendectomy      as a teenage  . Cholecystectomy      pt denies having cholecystectomy  . Femur im nail Right 03/23/2015    Procedure: INTRAMEDULLARY (IM) NAIL FEMORAL;  Surgeon: Durene Romans, MD;  Location: [facility];  Service: Orthopedics;  Laterality: Right;    Allergies  Allergen Reactions  . Penicillins Swelling and Rash      Medication List       This list is accurate as of: 04/06/15  3:58 PM.  Always use your most recent med list.  acetaminophen 500 MG tablet  Commonly known as:  TYLENOL  Take 500 mg by mouth every 6 (six) hours as needed (back pain).     albuterol (2.5 MG/3ML) 0.083% nebulizer solution  Commonly known as:  PROVENTIL  Take 3 mLs (2.5 mg total) by nebulization every 4 (four) hours as needed for wheezing or shortness of breath.     amLODipine 5 MG tablet  Commonly known as:  NORVASC  Take 1 tablet (5 mg total) by mouth daily.     aspirin 325 MG EC tablet  Take 1 tablet (325 mg total) by mouth 2 (two) times daily.     bisacodyl 5 MG EC tablet  Commonly known as:  DULCOLAX  Take 1 tablet (5 mg total) by mouth daily as needed for moderate  constipation.     carvedilol 25 MG tablet  Commonly known as:  COREG  Take 25 mg by mouth 2 (two) times daily with a meal.     CRESTOR 5 MG tablet  Generic drug:  rosuvastatin  Take 5 mg by mouth daily.     dorzolamide 2 % ophthalmic solution  Commonly known as:  TRUSOPT  Place 1 drop into both eyes 2 (two) times daily.     dorzolamide 2 % ophthalmic solution  Commonly known as:  TRUSOPT  Place 1 drop into both eyes 2 (two) times daily.     ferrous sulfate 325 (65 FE) MG tablet  Take 325 mg by mouth every other day.     FLUoxetine 20 MG capsule  Commonly known as:  PROZAC  Take 30 mg by mouth daily at 12 noon.     fluticasone 50 MCG/ACT nasal spray  Commonly known as:  FLONASE  Place 2 sprays into both nostrils 2 (two) times daily as needed for allergies or rhinitis.     furosemide 40 MG tablet  Commonly known as:  LASIX  Take 1 tablet (40 mg total) by mouth daily.     guaiFENesin 600 MG 12 hr tablet  Commonly known as:  MUCINEX  Take 2 tablets (1,200 mg total) by mouth 2 (two) times daily.     HYDROcodone-acetaminophen 5-325 MG tablet  Commonly known as:  NORCO/VICODIN  Take 1 tablet by mouth every 6 (six) hours as needed for moderate pain.     levothyroxine 88 MCG tablet  Commonly known as:  SYNTHROID  Take 1 tablet (88 mcg total) by mouth daily before breakfast.     LORazepam 2 MG tablet  Commonly known as:  ATIVAN  Take 1 tablet (2 mg total) by mouth every 8 (eight) hours as needed. anxiety     magnesium oxide 400 MG tablet  Commonly known as:  MAG-OX  Take 800 mg by mouth daily.     methocarbamol 500 MG tablet  Commonly known as:  ROBAXIN  Take 1 tablet (500 mg total) by mouth every 6 (six) hours as needed for muscle spasms.     multivitamin with minerals Tabs tablet  Take 1 tablet by mouth daily.     nitroGLYCERIN 0.4 MG SL tablet  Commonly known as:  NITROSTAT  Place 0.4 mg under the tongue every 5 (five) minutes as needed for chest pain. If pain  is unrelived after 3 doses, call MD     nitroGLYCERIN 0.4 MG SL tablet  Commonly known as:  NITROSTAT  Place 1 tablet (0.4 mg total) under the tongue every 5 (five) minutes as needed for chest pain.  NON FORMULARY  Place 2-6 L into the nose continuous.     OLANZapine 5 MG tablet  Commonly known as:  ZYPREXA  Take 5 mg by mouth daily.     pantoprazole 40 MG tablet  Commonly known as:  PROTONIX  Take 40 mg by mouth daily. 1 hour before breakfast     potassium chloride SA 20 MEQ tablet  Commonly known as:  K-DUR,KLOR-CON  Take 20 mEq by mouth daily. Reported on 04/04/2015     senna-docusate 8.6-50 MG tablet  Commonly known as:  Senokot-S  Take 1 tablet by mouth at bedtime as needed for mild constipation.     VITAMIN D PO  Take 1 tablet by mouth daily.        Immunization History  Administered Date(s) Administered  . Influenza Split 11/20/2010  . Influenza,inj,Quad PF,36+ Mos 01/02/2013  . PPD Test 03/23/2015  . Pneumococcal Polysaccharide-23 02/23/2011   Pertinent  Health Maintenance Due  Topic Date Due  . DEXA SCAN  07/12/1994  . PNA vac Low Risk Adult (2 of 2 - PCV13) 02/23/2012  . INFLUENZA VACCINE  09/20/2014   No flowsheet data found.  Filed Vitals:   04/06/15 1516  BP: 134/76  Pulse: 82  Temp: 98.4 F (36.9 C)  Resp: 20  Weight: 243 lb 14.4 oz (110.632 kg)  SpO2: 96%   Body mass index is 35 kg/(m^2). Physical Exam  Constitutional: She is oriented to person, place, and time. She appears well-developed and well-nourished. No distress.  HENT:  Head: Normocephalic.  Mouth/Throat: Oropharynx is clear and moist.  Eyes: Conjunctivae and EOM are normal. Pupils are equal, round, and reactive to light. Right eye exhibits discharge. Left eye exhibits no discharge. No scleral icterus.  Neck: Normal range of motion. No JVD present.  Cardiovascular: Normal rate, regular rhythm, normal heart sounds and intact distal pulses.  Exam reveals no gallop and no friction  rub.   No murmur heard. Pulmonary/Chest: Effort normal. No respiratory distress. She has no wheezes. She has no rales.  Diminished breath sound to bases.   Abdominal: Soft. Bowel sounds are normal. She exhibits no distension and no mass. There is no tenderness. There is no rebound.  Lymphadenopathy:    She has no cervical adenopathy.  Neurological: She is oriented to person, place, and time.  Skin: Skin is warm and dry. No rash noted. No erythema. No pallor.  Left hip/Thigh surgical DRSG dry, Clean and intact.   Psychiatric: She has a normal mood and affect.    Labs reviewed:  Recent Labs  03/30/15 0445 03/31/15 0402 04/01/15 0417  NA 140 140 140  K 4.3 4.2 4.1  CL 105 105 105  CO2 27 27 26   GLUCOSE 95 98 105*  BUN 36* 34* 30*  CREATININE 0.98 0.98 0.98  CALCIUM 8.4* 8.5* 8.6*    Recent Labs  03/29/15 1226 03/30/15 0445 04/01/15 0417  AST 24 19 22   ALT 33 28 23  ALKPHOS 51 47 55  BILITOT 0.8 0.7 0.9  PROT 5.7* 5.1* 5.4*  ALBUMIN 2.9* 2.7* 2.8*    Recent Labs  03/28/15 0517 03/29/15 1226 03/30/15 0445 03/31/15 0402 04/01/15 0417 04/05/15  WBC 16.4* 16.9* 16.1* 15.5* 13.9* 11.2  NEUTROABS 11.1* 12.4* 11.0*  --   --   --   HGB 9.0* 8.9* 8.3* 8.6* 9.0* 8.6*  HCT 28.5* 28.2* 25.4* 27.5* 29.4* 30*  MCV 97.6 97.9 95.5 98.9 99.7  --   PLT 399 429* 366 421* 403*  404*   Lab Results  Component Value Date   TSH 2.617 03/23/2015   Lab Results  Component Value Date   HGBA1C 6.0* 03/30/2015   No results found for: CHOL, HDL, LDLCALC, LDLDIRECT, TRIG, CHOLHDL  Significant Diagnostic Results in last 30 days:  Dg Chest 1 View  03/22/2015  CLINICAL DATA:  Fall with right hip fracture. EXAM: CHEST 1 VIEW COMPARISON:  03/16/2015 FINDINGS: Unchanged elevation right hemidiaphragm. Unchanged cardiomediastinal contours, heart at the upper limits normal in size. No pulmonary edema, confluent airspace disease, pleural effusion or pneumothorax. No acute osseous abnormalities  are seen. IMPRESSION: No acute process. Electronically Signed   By: Rubye Oaks M.D.   On: 03/22/2015 00:15   Dg Chest 1 View  03/16/2015  CLINICAL DATA:  Chest pain EXAM: CHEST 1 VIEW COMPARISON:  11/19/2014 FINDINGS: There is elevation of the right diaphragm. There is no focal parenchymal opacity. There is no pleural effusion or pneumothorax. The heart and mediastinal contours are unremarkable. The osseous structures are unremarkable. IMPRESSION: No active disease. Electronically Signed   By: Elige Ko   On: 03/16/2015 13:24   Dg Pelvis 1-2 Views  03/21/2015  CLINICAL DATA:  Pop in right hip going to the bathroom tonight. Initial encounter. EXAM: PELVIS - 1-2 VIEW COMPARISON:  None. FINDINGS: Transverse subtrochanteric right femur fracture with medial displacement. No evidence of pelvic ring fracture or diastasis. Located hips. IMPRESSION: Displaced subtrochanteric right femur fracture. Electronically Signed   By: Marnee Spring M.D.   On: 03/21/2015 23:55   Dg Knee 2 Views Right  03/21/2015  CLINICAL DATA:  Patent right leg beginning tonight. Initial encounter. EXAM: RIGHT KNEE - 1-2 VIEW COMPARISON:  06/16/2007 FINDINGS: There is no evidence of fracture, dislocation, or joint effusion. Well-seated total knee arthroplasty. Osteopenia IMPRESSION: No acute finding. Total arthroplasty. Electronically Signed   By: Marnee Spring M.D.   On: 03/21/2015 23:54   Ct Chest W Contrast  03/24/2015  CLINICAL DATA:  Hypoxia.  Evaluate for pneumonia. EXAM: CT CHEST WITH CONTRAST TECHNIQUE: Multidetector CT imaging of the chest was performed during intravenous contrast administration. CONTRAST:  75mL OMNIPAQUE IOHEXOL 300 MG/ML  SOLN COMPARISON:  Chest x-ray 03/21/2015 FINDINGS: Lungs are well inflated and demonstrate very minimal dependent atelectasis and linear atelectasis versus scarring over the right lower lobe. Subtle focal reticulonodular opacification over the posterior lateral aspect of the right  middle lobe which may be acute or chronic. Faint 3-4 mm subpleural nodular density over the lateral right upper lobe. Airways are within normal. Mild cardiomegaly. Minimal calcified plaque over the left anterior descending coronary artery. Mild calcified plaque over the thoracic aorta. No evidence of mediastinal, hilar or axillary adenopathy. Remaining mediastinal structures are within normal. Images through the upper abdomen demonstrate evidence of cholelithiasis. Minimal degenerative change of the spine. IMPRESSION: Subtle region of reticulonodular opacification over the posterior lateral right middle lobe which may be acute or chronic. This could be seen in an infectious/atypical infectious or inflammatory process. Recommend followup CT 4-6 weeks. Faint 3-4 mm subpleural nodule over the lateral right upper lobe. Recommend followup CT 1 year. This recommendation follows the consensus statement: Guidelines for Management of Small Pulmonary Nodules Detected on CT Scans: A Statement from the Fleischner Society as published in Radiology 2005; 237:395-400. Online at: DietDisorder.cz. Mild cardiomegaly.  Minimal atherosclerotic coronary artery disease. Cholelithiasis. Electronically Signed   By: Elberta Fortis M.D.   On: 03/24/2015 11:36   Ct Abdomen Pelvis W Contrast  03/29/2015  CLINICAL DATA:  Sepsis. Admitted 1 week ago with an acute right hip fracture. EXAM: CT ABDOMEN AND PELVIS WITH CONTRAST TECHNIQUE: Multidetector CT imaging of the abdomen and pelvis was performed using the standard protocol following bolus administration of intravenous contrast. CONTRAST:  OMNIPAQUE IOHEXOL 300 MG/ML  SOLN COMPARISON:  Chest CT dated 03/24/2015, pelvis radiograph dated 03/21/2015 and renal ultrasound dated 05/09/2011. FINDINGS: Lower chest: Interval small bilateral pleural effusions. Mild right lower lobe linear atelectasis. Hepatobiliary: Previously noted small gallstones in the  gallbladder. No gallbladder wall thickening or pericholecystic fluid. The largest stone measures 5 mm. Normal appearing liver. Pancreas: No mass, inflammatory changes, or other significant abnormality. Spleen: Within normal limits in size and appearance. Adrenals/Urinary Tract: Small left adrenal mass or focal thickening measuring 1.8 x 1.3 cm on image number 25. Normal appearing right adrenal gland. Small right renal cysts. The normal appearing left kidney and ureters. Decompressed urinary bladder with a Foley catheter in place. No urinary tract calculi are seen. Stomach/Bowel: Small number of sigmoid colon diverticula. No evidence of diverticulitis or appendicitis. The appendix is surgically absent by history. Vascular/Lymphatic: Atheromatous arterial calcifications. No enlarged lymph nodes. Reproductive: Surgically absent uterus.  No adnexal masses. Other: None. Musculoskeletal: Interval intramedullary rod and screw fixation of the previously demonstrated subtrochanteric right femur fracture. Right femoral head cysts. Mild bilateral hip degenerative changes. Lumbar and lower thoracic spine degenerative changes. IMPRESSION: 1. No acute abdominal abnormality. 2. Interval small bilateral pleural effusions and mild right lower lobe atelectasis. 3. Small left adrenal adenoma or focal thickening. 4. Cholelithiasis. 5. Mild sigmoid diverticulosis. Electronically Signed   By: Beckie Salts M.D.   On: 03/29/2015 17:29   Dg Chest Port 1 View  03/28/2015  CLINICAL DATA:  Shortness of breath EXAM: PORTABLE CHEST 1 VIEW COMPARISON:  03/25/2014 FINDINGS: Mild patchy left upper lobe opacity, new/ increased, suspicious for pneumonia. Increased interstitial markings.  No frank interstitial edema. Mild eventration of the right hemidiaphragm. No pleural effusion or pneumothorax. Cardiomegaly. IMPRESSION: Mild patchy left upper lobe opacity, new/ increased, suspicious for pneumonia. Electronically Signed   By: Charline Bills  M.D.   On: 03/28/2015 10:34   Dg Chest Port 1 View  03/26/2015  CLINICAL DATA:  Hypoxia, shortness of breath. EXAM: PORTABLE CHEST 1 VIEW COMPARISON:  March 21, 2015. FINDINGS: Stable cardiomediastinal silhouette. No pneumothorax or pleural effusion is noted. Elevated right hemidiaphragm is noted. Mildly increased bilateral diffuse interstitial densities are noted concerning for possible pulmonary edema. Bony thorax is unremarkable. IMPRESSION: Mildly increased interstitial densities are noted in both lungs concerning for possible minimal pulmonary edema. Electronically Signed   By: Lupita Raider, M.D.   On: 03/26/2015 10:06   Dg C-arm 61-120 Min-no Report  03/23/2015  CLINICAL DATA: surgery C-ARM 61-120 MINUTES Fluoroscopy was utilized by the requesting physician.  No radiographic interpretation.   Dg Femur, Min 2 Views Right  03/23/2015  CLINICAL DATA:  Right femoral IM nail. EXAM: RIGHT FEMUR 2 VIEWS; DG C-ARM 61-120 MIN-NO REPORT COMPARISON:  Preoperative radiographs 03/21/2015 FINDINGS: Four fluoroscopic spot images post IM nail insertion demonstrates intra medullary nail in the right femur with proximal and distal locking screws. A trans trochanteric screw is seen. Improved fracture alignment compared to preoperative exam. Fluoroscopy time reported 3 minutes 59 seconds. Exposure 61.94 mGy. IMPRESSION: Fluoroscopic spot images post IM nail insertion fixating subtrochanteric right femur fracture. Improved fracture alignment. Electronically Signed   By: Rubye Oaks M.D.   On: 03/23/2015 18:39   Dg Femur, Min 2 Views  Right  03/21/2015  CLINICAL DATA:  Larey Seat pop in leg EXAM: RIGHT FEMUR 2 VIEWS COMPARISON:  03/16/2015 FINDINGS: There is been interval development of a subtrochanteric proximal femur fracture. There is medial displacement of the distal fracture fragment with overlap of the fracture fragments by 2.3 cm. IMPRESSION: 1. Acute subtrochanteric proximal femur fracture. Electronically Signed    By: Signa Kell M.D.   On: 03/21/2015 23:57   Dg Femur, Min 2 Views Right  03/16/2015  CLINICAL DATA:  No known injury.  Pain above the knee. EXAM: RIGHT FEMUR 2 VIEWS COMPARISON:  None. FINDINGS: There is no evidence of fracture or other focal bone lesions. There is mild osteoarthritis of the right hip. There is a right total knee arthroplasty without failure or complication. Soft tissues are unremarkable. IMPRESSION: No acute osseous injury of the right femur. Electronically Signed   By: Elige Ko   On: 03/16/2015 13:25    Assessment/Plan 1. Leucocytosis Afebrile. WBC trending down previous 13.9 (04/01/2015) now 11.2( 04/05/2015. Exam findings diminished lung bases. Will order portable CXR PA/Lat. R/o PNA Continue to monitor CBC/diff.   2. Anemia, unspecified anemia type Currently on Ferrous Sulfate QOD. Hgb 8.6 ( 04/05/2015). Will change ferrous sulfate to 325 mg Tablet once daily. Monitor CBC  3. Protein-calorie malnutrition (HCC) Appetite has improved. TP 5.1, ALb 3.03 ( 04/05/2015). Had an order written for RD consult 04/04/15. Will reorder RD consult.     Family/ staff Communication:Reviewed Plan of Care with Patient and facility   Labs/tests ordered: CBC/diff, BMP 04/12/2015

## 2015-04-08 ENCOUNTER — Ambulatory Visit: Payer: Self-pay | Admitting: Cardiology

## 2015-04-12 LAB — CBC AND DIFFERENTIAL
HCT: 32 % — AB (ref 36–46)
Hemoglobin: 9.5 g/dL — AB (ref 12.0–16.0)
PLATELETS: 361 10*3/uL (ref 150–399)
WBC: 7.9 10*3/mL

## 2015-04-12 LAB — BASIC METABOLIC PANEL
BUN: 16 mg/dL (ref 4–21)
CREATININE: 1.2 mg/dL — AB (ref 0.5–1.1)
Glucose: 110 mg/dL
POTASSIUM: 4.5 mmol/L (ref 3.4–5.3)
Sodium: 140 mmol/L (ref 137–147)

## 2015-04-13 ENCOUNTER — Other Ambulatory Visit: Payer: Self-pay | Admitting: *Deleted

## 2015-04-13 MED ORDER — LORAZEPAM 2 MG PO TABS: ORAL_TABLET | ORAL | Status: DC

## 2015-04-13 NOTE — Telephone Encounter (Signed)
[facility] 

## 2015-04-15 ENCOUNTER — Non-Acute Institutional Stay (SKILLED_NURSING_FACILITY): Payer: Medicare Other | Admitting: Family

## 2015-04-15 DIAGNOSIS — Z978 Presence of other specified devices: Secondary | ICD-10-CM | POA: Insufficient documentation

## 2015-04-15 DIAGNOSIS — Z9289 Personal history of other medical treatment: Secondary | ICD-10-CM

## 2015-04-15 DIAGNOSIS — Z960 Presence of urogenital implants: Secondary | ICD-10-CM

## 2015-04-15 DIAGNOSIS — R1030 Lower abdominal pain, unspecified: Secondary | ICD-10-CM | POA: Diagnosis not present

## 2015-04-15 DIAGNOSIS — R05 Cough: Secondary | ICD-10-CM

## 2015-04-15 DIAGNOSIS — R102 Pelvic and perineal pain: Secondary | ICD-10-CM | POA: Insufficient documentation

## 2015-04-15 DIAGNOSIS — R059 Cough, unspecified: Secondary | ICD-10-CM | POA: Insufficient documentation

## 2015-04-15 NOTE — Progress Notes (Signed)
Patient ID: Debra Johnston, female   DOB: [birth year], 82 y.o.   MRN: [id number]  Location:  [facility]   Place of Service:  [id number] [phone]) Provider: [name], MD   Donovan Kail, MD  Patient Care Team: Donovan Kail, MD as PCP - General (Obstetrics and Gynecology)  Extended Emergency Contact Information Primary Emergency Contact: Ragsdale,Leroy Address: [address]          [place], [place] [address] [place] Home Phone: [phone] Mobile Phone: [phone] Relation: Spouse  Code Status:  Full Code  Goals of care: Advanced Directive information Advanced Directives 03/22/2015  Does patient have an advance directive? Yes  Copy of advanced directive(s) in chart? No - copy requested     Chief Complaint  Patient presents with  . Acute Visit    cough    HPI:  Pt is a 82 y.o. female seen today at [facility] for an acute visit for evaluation of cough and Foley Catheter removal request. She is here for short term rehabilitation post hospital admission from 03/21/15-04/01/15 with right hip fracture. She underwent ORIF on 03/24/15. She was also treated for pneumonia and leukocytosis with antibiotics. She has PMH of HTN, Afib, CHF, Hypothyroidism, GERD, OA among others. She is seen in her room today.  She complains of non-productive cough requesting for cough syrup. She also request foley catheter to be removed states has been having lower abdominal pain thinks it's from the foley catheter. Facility staff reports patient complained of lower abdominal pain, foley flushed 04/14/2015 and 1000 cc was obtained. Provider ordered urine specimen for U/A and C/S. Results pending. She has been afebrile. She denies any fever, chills, fatigue, Shortness of breath or wheezing.   Past Medical History  Diagnosis Date  . GERD (gastroesophageal reflux disease)   . Glaucoma   . Osteoarthritis   . Hyperlipidemia   . Anxiety   . Depression   . Bronchitis  02/22/2011  . Chronic pain   . Hypothyroidism   . DOE (dyspnea on exertion)     chronic   . CKD (chronic kidney disease) stage 4, GFR 15-29 ml/min (HCC)   . Physical deconditioning   . Falls   . PAF (paroxysmal atrial fibrillation) (HCC)     not felt to be a candidate for coumadin  . Hypertension   . CHF (congestive heart failure) (HCC)     felt to have diastolic dysfunction with normal EF at 60%  . Fibromyalgia     with chronic weakness   Past Surgical History  Procedure Laterality Date  . Hemorrhoid surgery    . Laparoscopic hysterectomy    . Knee surgery      bilateral  . Cesarean section    . Transthoracic echocardiogram  02/2011    EF 60% with diastolic dysfunction, high filling pressures  . Appendectomy      as a teenage  . Cholecystectomy      pt denies having cholecystectomy  . Femur im nail Right 03/23/2015    Procedure: INTRAMEDULLARY (IM) NAIL FEMORAL;  Surgeon: Durene Romans, MD;  Location: [facility];  Service: Orthopedics;  Laterality: Right;    Allergies  Allergen Reactions  . Penicillins Swelling and Rash      Medication List       This list is accurate as of: 04/15/15  4:29 PM.  Always use your most recent med list.  acetaminophen 500 MG tablet  Commonly known as:  TYLENOL  Take 500 mg by mouth every 6 (six) hours as needed (back pain).     albuterol (2.5 MG/3ML) 0.083% nebulizer solution  Commonly known as:  PROVENTIL  Take 3 mLs (2.5 mg total) by nebulization every 4 (four) hours as needed for wheezing or shortness of breath.     amLODipine 5 MG tablet  Commonly known as:  NORVASC  Take 1 tablet (5 mg total) by mouth daily.     aspirin 325 MG EC tablet  Take 1 tablet (325 mg total) by mouth 2 (two) times daily.     bisacodyl 5 MG EC tablet  Commonly known as:  DULCOLAX  Take 1 tablet (5 mg total) by mouth daily as needed for moderate constipation.     carvedilol 25 MG tablet  Commonly known as:  COREG  Take 25 mg by mouth 2  (two) times daily with a meal.     CRESTOR 5 MG tablet  Generic drug:  rosuvastatin  Take 5 mg by mouth daily.     dorzolamide 2 % ophthalmic solution  Commonly known as:  TRUSOPT  Place 1 drop into both eyes 2 (two) times daily.     dorzolamide 2 % ophthalmic solution  Commonly known as:  TRUSOPT  Place 1 drop into both eyes 2 (two) times daily.     ferrous sulfate 325 (65 FE) MG tablet  Take 325 mg by mouth every other day.     FLUoxetine 20 MG capsule  Commonly known as:  PROZAC  Take 30 mg by mouth daily at 12 noon.     fluticasone 50 MCG/ACT nasal spray  Commonly known as:  FLONASE  Place 2 sprays into both nostrils 2 (two) times daily as needed for allergies or rhinitis.     furosemide 40 MG tablet  Commonly known as:  LASIX  Take 1 tablet (40 mg total) by mouth daily.     guaiFENesin 600 MG 12 hr tablet  Commonly known as:  MUCINEX  Take 2 tablets (1,200 mg total) by mouth 2 (two) times daily.     HYDROcodone-acetaminophen 5-325 MG tablet  Commonly known as:  NORCO/VICODIN  Take 1 tablet by mouth every 6 (six) hours as needed for moderate pain.     levothyroxine 88 MCG tablet  Commonly known as:  SYNTHROID  Take 1 tablet (88 mcg total) by mouth daily before breakfast.     LORazepam 2 MG tablet  Commonly known as:  ATIVAN  Take one tablet by mouth twice daily for anxiety; Take one tablet by mouth every afternoon as needed for severe anxiety     magnesium oxide 400 MG tablet  Commonly known as:  MAG-OX  Take 800 mg by mouth daily.     methocarbamol 500 MG tablet  Commonly known as:  ROBAXIN  Take 1 tablet (500 mg total) by mouth every 6 (six) hours as needed for muscle spasms.     multivitamin with minerals Tabs tablet  Take 1 tablet by mouth daily.     nitroGLYCERIN 0.4 MG SL tablet  Commonly known as:  NITROSTAT  Place 0.4 mg under the tongue every 5 (five) minutes as needed for chest pain. If pain is unrelived after 3 doses, call MD      nitroGLYCERIN 0.4 MG SL tablet  Commonly known as:  NITROSTAT  Place 1 tablet (0.4 mg total) under the tongue every 5 (five) minutes as needed for  chest pain.     NON FORMULARY  Place 2-6 L into the nose continuous.     OLANZapine 5 MG tablet  Commonly known as:  ZYPREXA  Take 5 mg by mouth daily.     pantoprazole 40 MG tablet  Commonly known as:  PROTONIX  Take 40 mg by mouth daily. 1 hour before breakfast     potassium chloride SA 20 MEQ tablet  Commonly known as:  K-DUR,KLOR-CON  Take 20 mEq by mouth daily. Reported on 04/04/2015     senna-docusate 8.6-50 MG tablet  Commonly known as:  Senokot-S  Take 1 tablet by mouth at bedtime as needed for mild constipation.     VITAMIN D PO  Take 1 tablet by mouth daily.        Review of Systems  Constitutional: Negative for fever, chills, diaphoresis, activity change, appetite change and fatigue.  HENT: Negative for congestion, sinus pressure, sneezing and sore throat.   Eyes: Negative.   Respiratory: Positive for cough. Negative for chest tightness, shortness of breath, wheezing and stridor.   Cardiovascular: Negative.   Gastrointestinal: Negative for nausea, vomiting, diarrhea, constipation and abdominal distention.       Lower abdominal pain   Endocrine: Negative.   Genitourinary: Negative for dysuria, hematuria, flank pain and difficulty urinating.  Musculoskeletal: Negative for back pain and arthralgias.  Skin: Negative.   Neurological: Negative.   Hematological: Negative.   Psychiatric/Behavioral: Negative.     Immunization History  Administered Date(s) Administered  . Influenza Split 11/20/2010  . Influenza,inj,Quad PF,36+ Mos 01/02/2013  . PPD Test 03/23/2015  . Pneumococcal Polysaccharide-23 02/23/2011   Pertinent  Health Maintenance Due  Topic Date Due  . DEXA SCAN  07/12/1994  . PNA vac Low Risk Adult (2 of 2 - PCV13) 02/23/2012  . INFLUENZA VACCINE  09/20/2014   No flowsheet data found. Functional Status  Survey:    Filed Vitals:   04/15/15 1557  BP: 154/69  Pulse: 86  Temp: 98.2 F (36.8 C)  Resp: 20  Weight: 245 lb 3.2 oz (111.222 kg)  SpO2: 93%   Body mass index is 35.18 kg/(m^2). Physical Exam  Labs reviewed:  Recent Labs  03/30/15 0445 03/31/15 0402 04/01/15 0417  NA 140 140 140  K 4.3 4.2 4.1  CL 105 105 105  CO2 27 27 26   GLUCOSE 95 98 105*  BUN 36* 34* 30*  CREATININE 0.98 0.98 0.98  CALCIUM 8.4* 8.5* 8.6*    Recent Labs  03/29/15 1226 03/30/15 0445 04/01/15 0417  AST 24 19 22   ALT 33 28 23  ALKPHOS 51 47 55  BILITOT 0.8 0.7 0.9  PROT 5.7* 5.1* 5.4*  ALBUMIN 2.9* 2.7* 2.8*    Recent Labs  03/28/15 0517 03/29/15 1226 03/30/15 0445 03/31/15 0402 04/01/15 0417 04/05/15  WBC 16.4* 16.9* 16.1* 15.5* 13.9* 11.2  NEUTROABS 11.1* 12.4* 11.0*  --   --   --   HGB 9.0* 8.9* 8.3* 8.6* 9.0* 8.6*  HCT 28.5* 28.2* 25.4* 27.5* 29.4* 30*  MCV 97.6 97.9 95.5 98.9 99.7  --   PLT 399 429* 366 421* 403* 404*   Lab Results  Component Value Date   TSH 2.617 03/23/2015   Lab Results  Component Value Date   HGBA1C 6.0* 03/30/2015   No results found for: CHOL, HDL, LDLCALC, LDLDIRECT, TRIG, CHOLHDL  Significant Diagnostic Results in last 30 days:  Dg Chest 1 View  03/22/2015  CLINICAL DATA:  Fall with right hip fracture. EXAM:  CHEST 1 VIEW COMPARISON:  03/16/2015 FINDINGS: Unchanged elevation right hemidiaphragm. Unchanged cardiomediastinal contours, heart at the upper limits normal in size. No pulmonary edema, confluent airspace disease, pleural effusion or pneumothorax. No acute osseous abnormalities are seen. IMPRESSION: No acute process. Electronically Signed   By: Rubye Oaks M.D.   On: 03/22/2015 00:15   Dg Pelvis 1-2 Views  03/21/2015  CLINICAL DATA:  Pop in right hip going to the bathroom tonight. Initial encounter. EXAM: PELVIS - 1-2 VIEW COMPARISON:  None. FINDINGS: Transverse subtrochanteric right femur fracture with medial displacement. No  evidence of pelvic ring fracture or diastasis. Located hips. IMPRESSION: Displaced subtrochanteric right femur fracture. Electronically Signed   By: Marnee Spring M.D.   On: 03/21/2015 23:55   Dg Knee 2 Views Right  03/21/2015  CLINICAL DATA:  Patent right leg beginning tonight. Initial encounter. EXAM: RIGHT KNEE - 1-2 VIEW COMPARISON:  06/16/2007 FINDINGS: There is no evidence of fracture, dislocation, or joint effusion. Well-seated total knee arthroplasty. Osteopenia IMPRESSION: No acute finding. Total arthroplasty. Electronically Signed   By: Marnee Spring M.D.   On: 03/21/2015 23:54   Ct Chest W Contrast  03/24/2015  CLINICAL DATA:  Hypoxia.  Evaluate for pneumonia. EXAM: CT CHEST WITH CONTRAST TECHNIQUE: Multidetector CT imaging of the chest was performed during intravenous contrast administration. CONTRAST:  75mL OMNIPAQUE IOHEXOL 300 MG/ML  SOLN COMPARISON:  Chest x-ray 03/21/2015 FINDINGS: Lungs are well inflated and demonstrate very minimal dependent atelectasis and linear atelectasis versus scarring over the right lower lobe. Subtle focal reticulonodular opacification over the posterior lateral aspect of the right middle lobe which may be acute or chronic. Faint 3-4 mm subpleural nodular density over the lateral right upper lobe. Airways are within normal. Mild cardiomegaly. Minimal calcified plaque over the left anterior descending coronary artery. Mild calcified plaque over the thoracic aorta. No evidence of mediastinal, hilar or axillary adenopathy. Remaining mediastinal structures are within normal. Images through the upper abdomen demonstrate evidence of cholelithiasis. Minimal degenerative change of the spine. IMPRESSION: Subtle region of reticulonodular opacification over the posterior lateral right middle lobe which may be acute or chronic. This could be seen in an infectious/atypical infectious or inflammatory process. Recommend followup CT 4-6 weeks. Faint 3-4 mm subpleural nodule over  the lateral right upper lobe. Recommend followup CT 1 year. This recommendation follows the consensus statement: Guidelines for Management of Small Pulmonary Nodules Detected on CT Scans: A Statement from the Fleischner Society as published in Radiology 2005; 237:395-400. Online at: DietDisorder.cz. Mild cardiomegaly.  Minimal atherosclerotic coronary artery disease. Cholelithiasis. Electronically Signed   By: Elberta Fortis M.D.   On: 03/24/2015 11:36   Ct Abdomen Pelvis W Contrast  03/29/2015  CLINICAL DATA:  Sepsis. Admitted 1 week ago with an acute right hip fracture. EXAM: CT ABDOMEN AND PELVIS WITH CONTRAST TECHNIQUE: Multidetector CT imaging of the abdomen and pelvis was performed using the standard protocol following bolus administration of intravenous contrast. CONTRAST:  OMNIPAQUE IOHEXOL 300 MG/ML  SOLN COMPARISON:  Chest CT dated 03/24/2015, pelvis radiograph dated 03/21/2015 and renal ultrasound dated 05/09/2011. FINDINGS: Lower chest: Interval small bilateral pleural effusions. Mild right lower lobe linear atelectasis. Hepatobiliary: Previously noted small gallstones in the gallbladder. No gallbladder wall thickening or pericholecystic fluid. The largest stone measures 5 mm. Normal appearing liver. Pancreas: No mass, inflammatory changes, or other significant abnormality. Spleen: Within normal limits in size and appearance. Adrenals/Urinary Tract: Small left adrenal mass or focal thickening measuring 1.8 x 1.3 cm on image  number 25. Normal appearing right adrenal gland. Small right renal cysts. The normal appearing left kidney and ureters. Decompressed urinary bladder with a Foley catheter in place. No urinary tract calculi are seen. Stomach/Bowel: Small number of sigmoid colon diverticula. No evidence of diverticulitis or appendicitis. The appendix is surgically absent by history. Vascular/Lymphatic: Atheromatous arterial calcifications. No enlarged lymph  nodes. Reproductive: Surgically absent uterus.  No adnexal masses. Other: None. Musculoskeletal: Interval intramedullary rod and screw fixation of the previously demonstrated subtrochanteric right femur fracture. Right femoral head cysts. Mild bilateral hip degenerative changes. Lumbar and lower thoracic spine degenerative changes. IMPRESSION: 1. No acute abdominal abnormality. 2. Interval small bilateral pleural effusions and mild right lower lobe atelectasis. 3. Small left adrenal adenoma or focal thickening. 4. Cholelithiasis. 5. Mild sigmoid diverticulosis. Electronically Signed   By: Beckie Salts M.D.   On: 03/29/2015 17:29   Dg Chest Port 1 View  03/28/2015  CLINICAL DATA:  Shortness of breath EXAM: PORTABLE CHEST 1 VIEW COMPARISON:  03/25/2014 FINDINGS: Mild patchy left upper lobe opacity, new/ increased, suspicious for pneumonia. Increased interstitial markings.  No frank interstitial edema. Mild eventration of the right hemidiaphragm. No pleural effusion or pneumothorax. Cardiomegaly. IMPRESSION: Mild patchy left upper lobe opacity, new/ increased, suspicious for pneumonia. Electronically Signed   By: Charline Bills M.D.   On: 03/28/2015 10:34   Dg Chest Port 1 View  03/26/2015  CLINICAL DATA:  Hypoxia, shortness of breath. EXAM: PORTABLE CHEST 1 VIEW COMPARISON:  March 21, 2015. FINDINGS: Stable cardiomediastinal silhouette. No pneumothorax or pleural effusion is noted. Elevated right hemidiaphragm is noted. Mildly increased bilateral diffuse interstitial densities are noted concerning for possible pulmonary edema. Bony thorax is unremarkable. IMPRESSION: Mildly increased interstitial densities are noted in both lungs concerning for possible minimal pulmonary edema. Electronically Signed   By: Lupita Raider, M.D.   On: 03/26/2015 10:06   Dg C-arm 61-120 Min-no Report  03/23/2015  CLINICAL DATA: surgery C-ARM 61-120 MINUTES Fluoroscopy was utilized by the requesting physician.  No radiographic  interpretation.   Dg Femur, Min 2 Views Right  03/23/2015  CLINICAL DATA:  Right femoral IM nail. EXAM: RIGHT FEMUR 2 VIEWS; DG C-ARM 61-120 MIN-NO REPORT COMPARISON:  Preoperative radiographs 03/21/2015 FINDINGS: Four fluoroscopic spot images post IM nail insertion demonstrates intra medullary nail in the right femur with proximal and distal locking screws. A trans trochanteric screw is seen. Improved fracture alignment compared to preoperative exam. Fluoroscopy time reported 3 minutes 59 seconds. Exposure 61.94 mGy. IMPRESSION: Fluoroscopic spot images post IM nail insertion fixating subtrochanteric right femur fracture. Improved fracture alignment. Electronically Signed   By: Rubye Oaks M.D.   On: 03/23/2015 18:39   Dg Femur, Min 2 Views Right  03/21/2015  CLINICAL DATA:  Larey Seat pop in leg EXAM: RIGHT FEMUR 2 VIEWS COMPARISON:  03/16/2015 FINDINGS: There is been interval development of a subtrochanteric proximal femur fracture. There is medial displacement of the distal fracture fragment with overlap of the fracture fragments by 2.3 cm. IMPRESSION: 1. Acute subtrochanteric proximal femur fracture. Electronically Signed   By: Signa Kell M.D.   On: 03/21/2015 23:57    Assessment/Plan 1. Cough Afebrile. Non-productive cough.Ordering portable CXR 2 views. Robitussin DM for additional 3 days.   2. Foley catheter in place Draining adequate amounts of clear yellow urine. Ordering foley catheter trial 04/18/2015 if > 300 cc in after 3 -4 hrs clamping Keep foley catheter in if not remove foley Catheter. U/A and C/S results pending  3.  Suprapubic abdominal pain, unspecified laterality Non-distended.Ordering foley catheter trial 04/18/2015 if > 300 cc in after 3 -4 hrs clamping Keep foley catheter in if not remove foley Catheter. U/A and C/S results pending. Monitor output.       Family/ staff Communication: Reviewed plan of care with patient and facility staff Nurse.   Labs/tests ordered:  CXR  PA/Lat ( cough)

## 2015-04-28 ENCOUNTER — Non-Acute Institutional Stay (SKILLED_NURSING_FACILITY): Payer: Medicare Other | Admitting: Family

## 2015-04-28 DIAGNOSIS — I5032 Chronic diastolic (congestive) heart failure: Secondary | ICD-10-CM | POA: Diagnosis not present

## 2015-04-28 DIAGNOSIS — R05 Cough: Secondary | ICD-10-CM

## 2015-04-28 DIAGNOSIS — R058 Other specified cough: Secondary | ICD-10-CM

## 2015-04-28 NOTE — Progress Notes (Signed)
Patient ID: Debra Johnston, female   DOB: 03/04/1927, 82 y.o.   MRN: [id number]  Location:  [facility]   Place of Service:  SNF (31) Provider:Mahima Glade Lloyd, MD   [facility], MD  Patient Care Team: Donovan Kail, MD as PCP - General (Obstetrics and Gynecology)  Extended Emergency Contact Information Primary Emergency Contact: Casanas,Leroy Address: [address]          [place], [place] [address] [place] Home Phone: [phone] Mobile Phone: [phone] Relation: Spouse  Code Status:  Full Code  Goals of care: Advanced Directive information Advanced Directives 03/22/2015  Does patient have an advance directive? Yes  Copy of advanced directive(s) in chart? No - copy requested     Chief Complaint  Patient presents with  . Acute Visit    Non-Productive cough     HPI:  Pt is a 82 y.o. female seen today at [facility] for an acute visit for evaluation of non-Productive cough. She has a medical history of HTN, GERD, OA, Hypothyroidism, CHF among others. She is seen today in her room. She states feels much better from previous PNA but reports still has a non-productive cough which seems to be worsening over the last few days. She denies any fever, Chills, shortness of breath or wheezing. She is stable on continuous oxygen.     Past Medical History  Diagnosis Date  . GERD (gastroesophageal reflux disease)   . Glaucoma   . Osteoarthritis   . Hyperlipidemia   . Anxiety   . Depression   . Bronchitis 02/22/2011  . Chronic pain   . Hypothyroidism   . DOE (dyspnea on exertion)     chronic   . CKD (chronic kidney disease) stage 4, GFR 15-29 ml/min (HCC)   . Physical deconditioning   . Falls   . PAF (paroxysmal atrial fibrillation) (HCC)     not felt to be a candidate for coumadin  . Hypertension   . CHF (congestive heart failure) (HCC)     felt to have diastolic dysfunction with normal EF at 60%  . Fibromyalgia     with  chronic weakness   Past Surgical History  Procedure Laterality Date  . Hemorrhoid surgery    . Laparoscopic hysterectomy    . Knee surgery      bilateral  . Cesarean section    . Transthoracic echocardiogram  02/2011    EF 60% with diastolic dysfunction, high filling pressures  . Appendectomy      as a teenage  . Cholecystectomy      pt denies having cholecystectomy  . Femur im nail Right 03/23/2015    Procedure: INTRAMEDULLARY (IM) NAIL FEMORAL;  Surgeon: Durene Romans, MD;  Location: [facility];  Service: Orthopedics;  Laterality: Right;    Allergies  Allergen Reactions  . Penicillins Swelling and Rash      Medication List       This list is accurate as of: 04/28/15  2:33 PM.  Always use your most recent med list.               acetaminophen 500 MG tablet  Commonly known as:  TYLENOL  Take 500 mg by mouth every 6 (six) hours as needed (back pain).     albuterol (2.5 MG/3ML) 0.083% nebulizer solution  Commonly known as:  PROVENTIL  Take 3 mLs (2.5 mg total) by nebulization every 4 (four) hours as needed for wheezing or shortness of breath.  amLODipine 5 MG tablet  Commonly known as:  NORVASC  Take 1 tablet (5 mg total) by mouth daily.     aspirin 325 MG EC tablet  Take 1 tablet (325 mg total) by mouth 2 (two) times daily.     bisacodyl 5 MG EC tablet  Commonly known as:  DULCOLAX  Take 1 tablet (5 mg total) by mouth daily as needed for moderate constipation.     carvedilol 25 MG tablet  Commonly known as:  COREG  Take 25 mg by mouth 2 (two) times daily with a meal.     CRESTOR 5 MG tablet  Generic drug:  rosuvastatin  Take 5 mg by mouth daily.     dorzolamide 2 % ophthalmic solution  Commonly known as:  TRUSOPT  Place 1 drop into both eyes 2 (two) times daily.     dorzolamide 2 % ophthalmic solution  Commonly known as:  TRUSOPT  Place 1 drop into both eyes 2 (two) times daily.     ferrous sulfate 325 (65 FE) MG tablet  Take 325 mg by mouth every other  day.     FLUoxetine 20 MG capsule  Commonly known as:  PROZAC  Take 30 mg by mouth daily at 12 noon.     fluticasone 50 MCG/ACT nasal spray  Commonly known as:  FLONASE  Place 2 sprays into both nostrils 2 (two) times daily as needed for allergies or rhinitis.     furosemide 40 MG tablet  Commonly known as:  LASIX  Take 1 tablet (40 mg total) by mouth daily.     guaiFENesin 600 MG 12 hr tablet  Commonly known as:  MUCINEX  Take 2 tablets (1,200 mg total) by mouth 2 (two) times daily.     HYDROcodone-acetaminophen 5-325 MG tablet  Commonly known as:  NORCO/VICODIN  Take 1 tablet by mouth every 6 (six) hours as needed for moderate pain.     levothyroxine 88 MCG tablet  Commonly known as:  SYNTHROID  Take 1 tablet (88 mcg total) by mouth daily before breakfast.     LORazepam 2 MG tablet  Commonly known as:  ATIVAN  Take one tablet by mouth twice daily for anxiety; Take one tablet by mouth every afternoon as needed for severe anxiety     magnesium oxide 400 MG tablet  Commonly known as:  MAG-OX  Take 800 mg by mouth daily.     methocarbamol 500 MG tablet  Commonly known as:  ROBAXIN  Take 1 tablet (500 mg total) by mouth every 6 (six) hours as needed for muscle spasms.     multivitamin with minerals Tabs tablet  Take 1 tablet by mouth daily.     nitroGLYCERIN 0.4 MG SL tablet  Commonly known as:  NITROSTAT  Place 0.4 mg under the tongue every 5 (five) minutes as needed for chest pain. If pain is unrelived after 3 doses, call MD     nitroGLYCERIN 0.4 MG SL tablet  Commonly known as:  NITROSTAT  Place 1 tablet (0.4 mg total) under the tongue every 5 (five) minutes as needed for chest pain.     NON FORMULARY  Place 2-6 L into the nose continuous.     OLANZapine 5 MG tablet  Commonly known as:  ZYPREXA  Take 5 mg by mouth daily.     pantoprazole 40 MG tablet  Commonly known as:  PROTONIX  Take 40 mg by mouth daily. 1 hour before breakfast  potassium chloride SA  20 MEQ tablet  Commonly known as:  K-DUR,KLOR-CON  Take 20 mEq by mouth daily. Reported on 04/04/2015     senna-docusate 8.6-50 MG tablet  Commonly known as:  Senokot-S  Take 1 tablet by mouth at bedtime as needed for mild constipation.     VITAMIN D PO  Take 1 tablet by mouth daily.        Review of Systems  Constitutional: Negative for fever, chills, activity change and appetite change.  HENT: Negative.   Respiratory: Negative for chest tightness, shortness of breath, wheezing and stridor.        Non-Productive cough   Cardiovascular: Negative.   Gastrointestinal: Negative.   Genitourinary: Negative.   Skin: Negative.   Psychiatric/Behavioral: Negative.     Immunization History  Administered Date(s) Administered  . Influenza Split 11/20/2010  . Influenza,inj,Quad PF,36+ Mos 01/02/2013  . PPD Test 03/23/2015  . Pneumococcal Polysaccharide-23 02/23/2011   Pertinent  Health Maintenance Due  Topic Date Due  . DEXA SCAN  07/12/1994  . PNA vac Low Risk Adult (2 of 2 - PCV13) 02/23/2012  . INFLUENZA VACCINE  09/20/2014   No flowsheet data found. Functional Status Survey:    Filed Vitals:   04/28/15 1420  BP: 134/77  Pulse: 86  Temp: 98.1 F (36.7 C)  Resp: 20  Weight: 227 lb (102.967 kg)  SpO2: 94%   Body mass index is 32.57 kg/(m^2). Physical Exam  Constitutional: She is oriented to person, place, and time. She appears well-developed and well-nourished. No distress.  HENT:  Head: Normocephalic and atraumatic.  Mouth/Throat: Oropharynx is clear and moist.  Eyes: Conjunctivae and EOM are normal. Pupils are equal, round, and reactive to light. Right eye exhibits no discharge. Left eye exhibits no discharge. No scleral icterus.  Neck: Normal range of motion. No JVD present. No thyromegaly present.  Cardiovascular: Normal rate, regular rhythm, normal heart sounds and intact distal pulses.  Exam reveals no gallop and no friction rub.   No murmur  heard. Pulmonary/Chest: Effort normal and breath sounds normal. No respiratory distress.  Bilateral Lung diminished wheezes with left mid lobe rales noted.   Abdominal: Soft. Bowel sounds are normal. She exhibits no distension and no mass. There is no tenderness. There is no rebound and no guarding.  Musculoskeletal: Normal range of motion. She exhibits no edema or tenderness.  Lymphadenopathy:    She has no cervical adenopathy.  Neurological: She is oriented to person, place, and time.  Skin: Skin is warm and dry. No rash noted. No erythema. No pallor.  Psychiatric: She has a normal mood and affect.    Labs reviewed:  Recent Labs  03/30/15 0445 03/31/15 0402 04/01/15 0417  NA 140 140 140  K 4.3 4.2 4.1  CL 105 105 105  CO2 27 27 26   GLUCOSE 95 98 105*  BUN 36* 34* 30*  CREATININE 0.98 0.98 0.98  CALCIUM 8.4* 8.5* 8.6*    Recent Labs  03/29/15 1226 03/30/15 0445 04/01/15 0417  AST 24 19 22   ALT 33 28 23  ALKPHOS 51 47 55  BILITOT 0.8 0.7 0.9  PROT 5.7* 5.1* 5.4*  ALBUMIN 2.9* 2.7* 2.8*    Recent Labs  03/28/15 0517 03/29/15 1226 03/30/15 0445 03/31/15 0402 04/01/15 0417 04/05/15  WBC 16.4* 16.9* 16.1* 15.5* 13.9* 11.2  NEUTROABS 11.1* 12.4* 11.0*  --   --   --   HGB 9.0* 8.9* 8.3* 8.6* 9.0* 8.6*  HCT 28.5* 28.2* 25.4* 27.5*  29.4* 30*  MCV 97.6 97.9 95.5 98.9 99.7  --   PLT 399 429* 366 421* 403* 404*   Lab Results  Component Value Date   TSH 2.617 03/23/2015   Lab Results  Component Value Date   HGBA1C 6.0* 03/30/2015   No results found for: CHOL, HDL, LDLCALC, LDLDIRECT, TRIG, CHOLHDL  Significant Diagnostic Results in last 30 days:  Ct Abdomen Pelvis W Contrast  03/29/2015  CLINICAL DATA:  Sepsis. Admitted 1 week ago with an acute right hip fracture. EXAM: CT ABDOMEN AND PELVIS WITH CONTRAST TECHNIQUE: Multidetector CT imaging of the abdomen and pelvis was performed using the standard protocol following bolus administration of intravenous contrast.  CONTRAST:  OMNIPAQUE IOHEXOL 300 MG/ML  SOLN COMPARISON:  Chest CT dated 03/24/2015, pelvis radiograph dated 03/21/2015 and renal ultrasound dated 05/09/2011. FINDINGS: Lower chest: Interval small bilateral pleural effusions. Mild right lower lobe linear atelectasis. Hepatobiliary: Previously noted small gallstones in the gallbladder. No gallbladder wall thickening or pericholecystic fluid. The largest stone measures 5 mm. Normal appearing liver. Pancreas: No mass, inflammatory changes, or other significant abnormality. Spleen: Within normal limits in size and appearance. Adrenals/Urinary Tract: Small left adrenal mass or focal thickening measuring 1.8 x 1.3 cm on image number 25. Normal appearing right adrenal gland. Small right renal cysts. The normal appearing left kidney and ureters. Decompressed urinary bladder with a Foley catheter in place. No urinary tract calculi are seen. Stomach/Bowel: Small number of sigmoid colon diverticula. No evidence of diverticulitis or appendicitis. The appendix is surgically absent by history. Vascular/Lymphatic: Atheromatous arterial calcifications. No enlarged lymph nodes. Reproductive: Surgically absent uterus.  No adnexal masses. Other: None. Musculoskeletal: Interval intramedullary rod and screw fixation of the previously demonstrated subtrochanteric right femur fracture. Right femoral head cysts. Mild bilateral hip degenerative changes. Lumbar and lower thoracic spine degenerative changes. IMPRESSION: 1. No acute abdominal abnormality. 2. Interval small bilateral pleural effusions and mild right lower lobe atelectasis. 3. Small left adrenal adenoma or focal thickening. 4. Cholelithiasis. 5. Mild sigmoid diverticulosis. Electronically Signed   By: Beckie Salts M.D.   On: 03/29/2015 17:29    Assessment/Plan 1. Non-productive cough Worsening cough over several days. Afebrile. Exam findings diminished bilateral wheezes with left mid-lobe rales. Continue Albuterol via  Nebulizer. Will obtain portable CXR PA/Lat. Continue current cough syrup.   2. Chronic diastolic CHF (congestive heart failure) (HCC) No weight gain, edema or SOB.Diminished bilat. Lung wheezes with left mid lobe rales. Continue daily weight.Ordering portable CXR PA/Lat.      Family/ staff Communication: Reviewed plan of care with Patient and facility Nurse Supervisor.   Labs/tests ordered:  portable CXR PA/Lat.

## 2015-05-11 ENCOUNTER — Encounter: Payer: Self-pay | Admitting: Internal Medicine

## 2015-05-11 ENCOUNTER — Non-Acute Institutional Stay (SKILLED_NURSING_FACILITY): Payer: Medicare Other | Admitting: Internal Medicine

## 2015-05-11 DIAGNOSIS — S72141S Displaced intertrochanteric fracture of right femur, sequela: Secondary | ICD-10-CM

## 2015-05-11 DIAGNOSIS — E611 Iron deficiency: Secondary | ICD-10-CM | POA: Diagnosis not present

## 2015-05-11 DIAGNOSIS — M62838 Other muscle spasm: Secondary | ICD-10-CM

## 2015-05-11 DIAGNOSIS — S72141A Displaced intertrochanteric fracture of right femur, initial encounter for closed fracture: Secondary | ICD-10-CM | POA: Insufficient documentation

## 2015-05-11 DIAGNOSIS — N3000 Acute cystitis without hematuria: Secondary | ICD-10-CM

## 2015-05-11 DIAGNOSIS — E669 Obesity, unspecified: Secondary | ICD-10-CM | POA: Insufficient documentation

## 2015-05-11 DIAGNOSIS — E785 Hyperlipidemia, unspecified: Secondary | ICD-10-CM | POA: Diagnosis not present

## 2015-05-11 DIAGNOSIS — E038 Other specified hypothyroidism: Secondary | ICD-10-CM | POA: Diagnosis not present

## 2015-05-11 DIAGNOSIS — F4323 Adjustment disorder with mixed anxiety and depressed mood: Secondary | ICD-10-CM | POA: Diagnosis not present

## 2015-05-11 NOTE — Progress Notes (Signed)
Patient ID: Debra Johnston, female   DOB: 1927/10/24, 82 y.o.   MRN: [id number]    [facility]  PCP: Donovan KailOSS,ALLeN, MD   Code Status: Full Code  Goals of care: Advanced Directive information Advanced Directives 03/22/2015  Does patient have an advance directive? Yes  Copy of advanced directive(s) in chart? No - copy requested     Extended Emergency Contact Information Primary Emergency Contact: Morelos,Leroy Address: [address]          [place], [place] [id number] [place] Home Phone: [phone] Mobile Phone: [phone] Relation: Spouse   Allergies  Allergen Reactions  . Penicillins Swelling and Rash    Chief Complaint  Patient presents with  . Medical Management of Chronic Issues    Routine Visit     HPI:  Patient is a 82 y.o. female seen today for routine visit. She is here for long term care with right hip fracture s/p ORIF on 03/24/15. Her pain is under control with current pain regimen. She continues to have a foley catheter. She has been feeling low lately with thoughts regarding her recovery and her not returning home. Continues to be on o2. She has started walking with help of walker with assistance with therapy team from yesterday. She has been experiencing muscle spasm to her leg x 2 days. She is currently on antibiotics for UTI.   Review of Systems:  Constitutional: Negative for fever, chills. HENT: Negative for headache, congestion, nasal discharge Eyes: Negative for blurred vision, double vision and discharge.  Respiratory: Negative for wheezing and cough. Has dyspnea with exertion, o2 has been helpful  Cardiovascular: Negative for chest pain, palpitations.  Gastrointestinal: Negative for heartburn, vomiting, abdominal pain. Has occasional nausea.  Had bowel movement last night Genitourinary: has foley catheter. Denies flank pain Musculoskeletal: Negative for back pain, fall in the facility Skin: Negative for itching, rash.    Neurological: Negative for dizziness Psychiatric/Behavioral: Negative for insomnia. Has increased daytime somnolence   Past Medical History  Diagnosis Date  . GERD (gastroesophageal reflux disease)   . Glaucoma   . Osteoarthritis   . Hyperlipidemia   . Anxiety   . Depression   . Bronchitis 02/22/2011  . Chronic pain   . Hypothyroidism   . DOE (dyspnea on exertion)     chronic   . CKD (chronic kidney disease) stage 4, GFR 15-29 ml/min (HCC)   . Physical deconditioning   . Falls   . PAF (paroxysmal atrial fibrillation) (HCC)     not felt to be a candidate for coumadin  . Hypertension   . CHF (congestive heart failure) (HCC)     felt to have diastolic dysfunction with normal EF at 60%  . Fibromyalgia     with chronic weakness   Past Surgical History  Procedure Laterality Date  . Hemorrhoid surgery    . Laparoscopic hysterectomy    . Knee surgery      bilateral  . Cesarean section    . Transthoracic echocardiogram  02/2011    EF 60% with diastolic dysfunction, high filling pressures  . Appendectomy      as a teenage  . Cholecystectomy      pt denies having cholecystectomy  . Femur im nail Right 03/23/2015    Procedure: INTRAMEDULLARY (IM) NAIL FEMORAL;  Surgeon: Durene RomansMatthew Olin, MD;  Location: [facility];  Service: Orthopedics;  Laterality: Right;    Medications:   Medication List       This list is accurate  as of: 05/11/15  1:25 PM.  Always use your most recent med list.               acetaminophen 500 MG tablet  Commonly known as:  TYLENOL  Take 500 mg by mouth every 6 (six) hours as needed (back pain).     albuterol (2.5 MG/3ML) 0.083% nebulizer solution  Commonly known as:  PROVENTIL  Take 3 mLs (2.5 mg total) by nebulization every 4 (four) hours as needed for wheezing or shortness of breath.     amLODipine 5 MG tablet  Commonly known as:  NORVASC  Take 1 tablet (5 mg total) by mouth daily.     aspirin 325 MG EC tablet  Take 1 tablet (325 mg total) by mouth  2 (two) times daily.     atorvastatin 20 MG tablet  Commonly known as:  LIPITOR  Take 20 mg by mouth daily.     bisacodyl 5 MG EC tablet  Commonly known as:  DULCOLAX  Take 1 tablet (5 mg total) by mouth daily as needed for moderate constipation.     carvedilol 25 MG tablet  Commonly known as:  COREG  Take 25 mg by mouth 2 (two) times daily with a meal.     dorzolamide 2 % ophthalmic solution  Commonly known as:  TRUSOPT  Place 1 drop into both eyes 2 (two) times daily.     dorzolamide 2 % ophthalmic solution  Commonly known as:  TRUSOPT  Place 1 drop into both eyes 2 (two) times daily.     dorzolamide 2 % ophthalmic solution  Commonly known as:  TRUSOPT  Place 1 drop into both eyes 2 (two) times daily.     ferrous sulfate 325 (65 FE) MG tablet  Take 325 mg by mouth daily.     FLUoxetine 10 MG capsule  Commonly known as:  PROZAC  Take 10 mg by mouth daily. Give with the 20 mg for a total of 30 mg     FLUoxetine 20 MG capsule  Commonly known as:  PROZAC  Take 30 mg by mouth daily at 12 noon.     fluticasone 50 MCG/ACT nasal spray  Commonly known as:  FLONASE  Place 2 sprays into both nostrils 2 (two) times daily as needed for allergies or rhinitis.     furosemide 40 MG tablet  Commonly known as:  LASIX  Take 1 tablet (40 mg total) by mouth daily.     guaiFENesin 600 MG 12 hr tablet  Commonly known as:  MUCINEX  Take 2 tablets (1,200 mg total) by mouth 2 (two) times daily.     HYDROcodone-acetaminophen 5-325 MG tablet  Commonly known as:  NORCO/VICODIN  Take 1 tablet by mouth every 6 (six) hours as needed for moderate pain.     levothyroxine 88 MCG tablet  Commonly known as:  SYNTHROID  Take 1 tablet (88 mcg total) by mouth daily before breakfast.     LORazepam 2 MG tablet  Commonly known as:  ATIVAN  Take 2 mg by mouth at bedtime.     magnesium oxide 400 MG tablet  Commonly known as:  MAG-OX  Take 800 mg by mouth daily.     methocarbamol 500 MG tablet    Commonly known as:  ROBAXIN  Take 1 tablet (500 mg total) by mouth every 6 (six) hours as needed for muscle spasms.     multivitamin with minerals Tabs tablet  Take 1 tablet by mouth daily.  nitrofurantoin 100 MG capsule  Commonly known as:  MACRODANTIN  Take 100 mg by mouth 2 (two) times daily. Stop date 05/13/15     nitroGLYCERIN 0.4 MG SL tablet  Commonly known as:  NITROSTAT  Place 0.4 mg under the tongue every 5 (five) minutes as needed for chest pain. If pain is unrelived after 3 doses, call MD     nitroGLYCERIN 0.4 MG SL tablet  Commonly known as:  NITROSTAT  Place 1 tablet (0.4 mg total) under the tongue every 5 (five) minutes as needed for chest pain.     NON FORMULARY  Place 2-6 L into the nose continuous.     OLANZapine 5 MG tablet  Commonly known as:  ZYPREXA  Take 5 mg by mouth daily.     ondansetron 4 MG tablet  Commonly known as:  ZOFRAN  Take 4 mg by mouth every 6 (six) hours as needed for nausea or vomiting.     pantoprazole 40 MG tablet  Commonly known as:  PROTONIX  Take 40 mg by mouth daily. 1 hour before breakfast     potassium chloride SA 20 MEQ tablet  Commonly known as:  K-DUR,KLOR-CON  Take 20 mEq by mouth daily. Reported on 04/04/2015     saccharomyces boulardii 250 MG capsule  Commonly known as:  FLORASTOR  Take 250 mg by mouth 2 (two) times daily. Stop date 05/16/15     senna-docusate 8.6-50 MG tablet  Commonly known as:  Senokot-S  Take 1 tablet by mouth at bedtime as needed for mild constipation.     VITAMIN D PO  Take 1 tablet by mouth daily.        Immunizations: Immunization History  Administered Date(s) Administered  . Influenza Split 11/20/2010  . Influenza,inj,Quad PF,36+ Mos 01/02/2013  . PPD Test 03/23/2015, 04/19/2015  . Pneumococcal Polysaccharide-23 02/23/2011     Physical Exam: Filed Vitals:   05/11/15 1255  BP: 117/55  Pulse: 87  Temp: 98 F (36.7 C)  TempSrc: Oral  Resp: 20  Height:  (1.778 m)   Weight: 227 lb 9.6 oz (103.239 kg)  SpO2: 94%   Body mass index is 32.66 kg/(m^2).  General- elderly female, obese, in no acute distress Head- normocephalic, atraumatic Nose- no maxillary or frontal sinus tenderness, no nasal discharge Throat- moist mucus membrane Eyes- PERRLA, EOMI, no pallor, no icterus, no discharge, normal conjunctiva, normal sclera Neck- no cervical lymphadenopathy Cardiovascular- normal s1,s2, no murmurs, no leg edema Respiratory- CTAB, no wheeze, no rhonchi, no crackles, no use of accessory muscles, on o2 Abdomen- bowel sounds present, soft, non tender, foley in place Musculoskeletal- able to move all 4 extremities, generalized weakness, limited right leg ROM Neurological- alert and oriented to person, place and time Skin- warm and dry Psychiatry- flat affect    Labs reviewed: Basic Metabolic Panel:  Recent Labs  98/11/91 0445 03/31/15 0402 04/01/15 0417 04/05/15 04/12/15  NA 140 140 140 143 140  K 4.3 4.2 4.1 4.4 4.5  CL 105 105 105  --   --   CO2 --   --   GLUCOSE 95 98 105*  --   --   BUN 36* 34* 30* 11 16  CREATININE 0.98 0.98 0.98 0.8 1.2*  CALCIUM 8.4* 8.5* 8.6*  --   --    Liver Function Tests:  Recent Labs  03/29/15 1226 03/30/15 0445 04/01/15 0417  AST ALT 33 28 23  ALKPHOS 51 47  55  BILITOT 0.8 0.7 0.9  PROT 5.7* 5.1* 5.4*  ALBUMIN 2.9* 2.7* 2.8*   No results for input(s): LIPASE, AMYLASE in the last 8760 hours. No results for input(s): AMMONIA in the last 8760 hours. CBC:  Recent Labs  03/28/15 0517 03/29/15 1226 03/30/15 0445 03/31/15 0402 04/01/15 0417 04/05/15 04/12/15  WBC 16.4* 16.9* 16.1* 15.5* 13.9* 11.2 7.9  NEUTROABS 11.1* 12.4* 11.0*  --   --   --   --   HGB 9.0* 8.9* 8.3* 8.6* 9.0* 8.6* 9.5*  HCT 28.5* 28.2* 25.4* 27.5* 29.4* 30* 32*  MCV 97.6 97.9 95.5 98.9 99.7  --   --   PLT 399 429* 366 421* 403* 404* 361   Cardiac Enzymes:  Recent Labs  03/31/15 0402  CKTOTAL 32*    BNP: Invalid input(s): POCBNP CBG:  Recent Labs  03/31/15 2054 04/01/15 0743 04/01/15 1143  GLUCAP 124* 120* 98     Assessment/Plan  Right hip fracture S/p ORIF. to work with PT/OT as tolerated to regain strength and restore function.  Fall precautions are in place. WBAT to RLE. Continue norco 5-325 mg q6h prn pain  Muscle spasm Currently on robaxin 500 mg q6h prn muscle spasm. Patient not asking for it. Change robaxin to 500 mg at bedtime and continue daytime regimen as 500 mg q8h prn muscle spasm and monitor  Acute cystitis Continue foley care. Continue and complete nitrofurantoin 100 mg bid on 05/13/15. Continue florastor. maintain hydration  Iron deficiency anemia Continue ferrous sulfate 325 mg daily and check cbc in 8 weeks  Mood disorder Continue olanzapine and prozac with her lorazepam. Get psychiatry consult to evaluate further  Hypothyroidism Lab Results  Component Value Date   TSH 2.617 03/23/2015   Stable thyroid function. Continue levothyroxine 88 mcg daily  Hyperlipidemia Lipid Panel  No results found for: CHOL, TRIG, HDL, CHOLHDL, VLDL, LDLCALC, LDLDIRECT  Continue lipitor and check lipid panel  Obesity Has prediabetes and hyperlipidemia. Monitor clinically. Dietary consult to evaluate for portion control and calorie counting  Lab Results  Component Value Date   HGBA1C 6.0* 03/30/2015  Continue lipitor, check lipid panel.    Family/ staff Communication: reviewed care plan with patient and nursing supervisor    [name], MD Internal Medicine [facility] [address] [place], [place] [address] Cell Phone (Monday-Friday 8 am - 5 pm): [phone] On Call: [phone] and follow prompts after 5 pm and on weekends Office Phone: [phone] Office Fax: [phone]

## 2015-05-12 LAB — LIPID PANEL
CHOLESTEROL: 135 mg/dL (ref 0–200)
HDL: 31 mg/dL — AB (ref 35–70)
LDL Cholesterol: 64 mg/dL
TRIGLYCERIDES: 199 mg/dL — AB (ref 40–160)

## 2015-06-01 IMAGING — CR DG CHEST 2V
2 series · 2 of 2 positions shown · non-contrast
Comparison: October 09, 2013

CLINICAL DATA: Dyspnea

EXAM:
CHEST  2 VIEW

[view not recorded (1 of 2)]
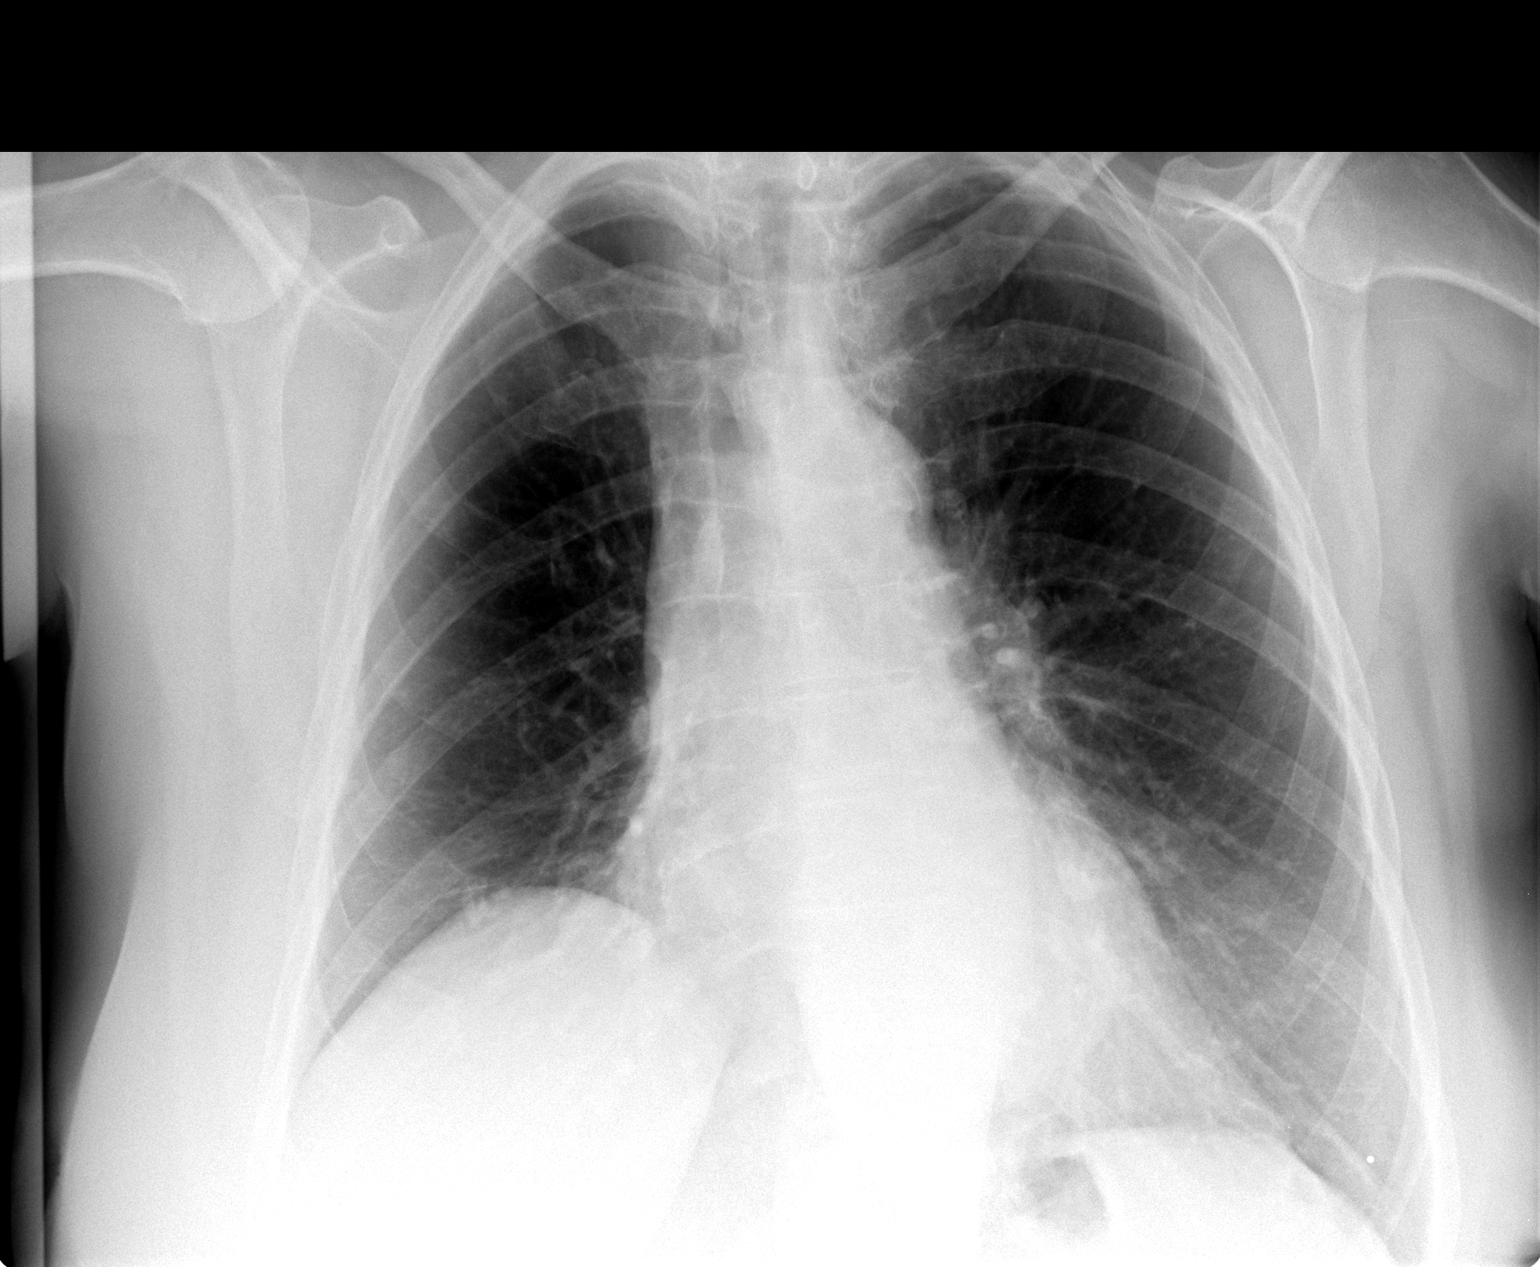

[view not recorded (2 of 2)]
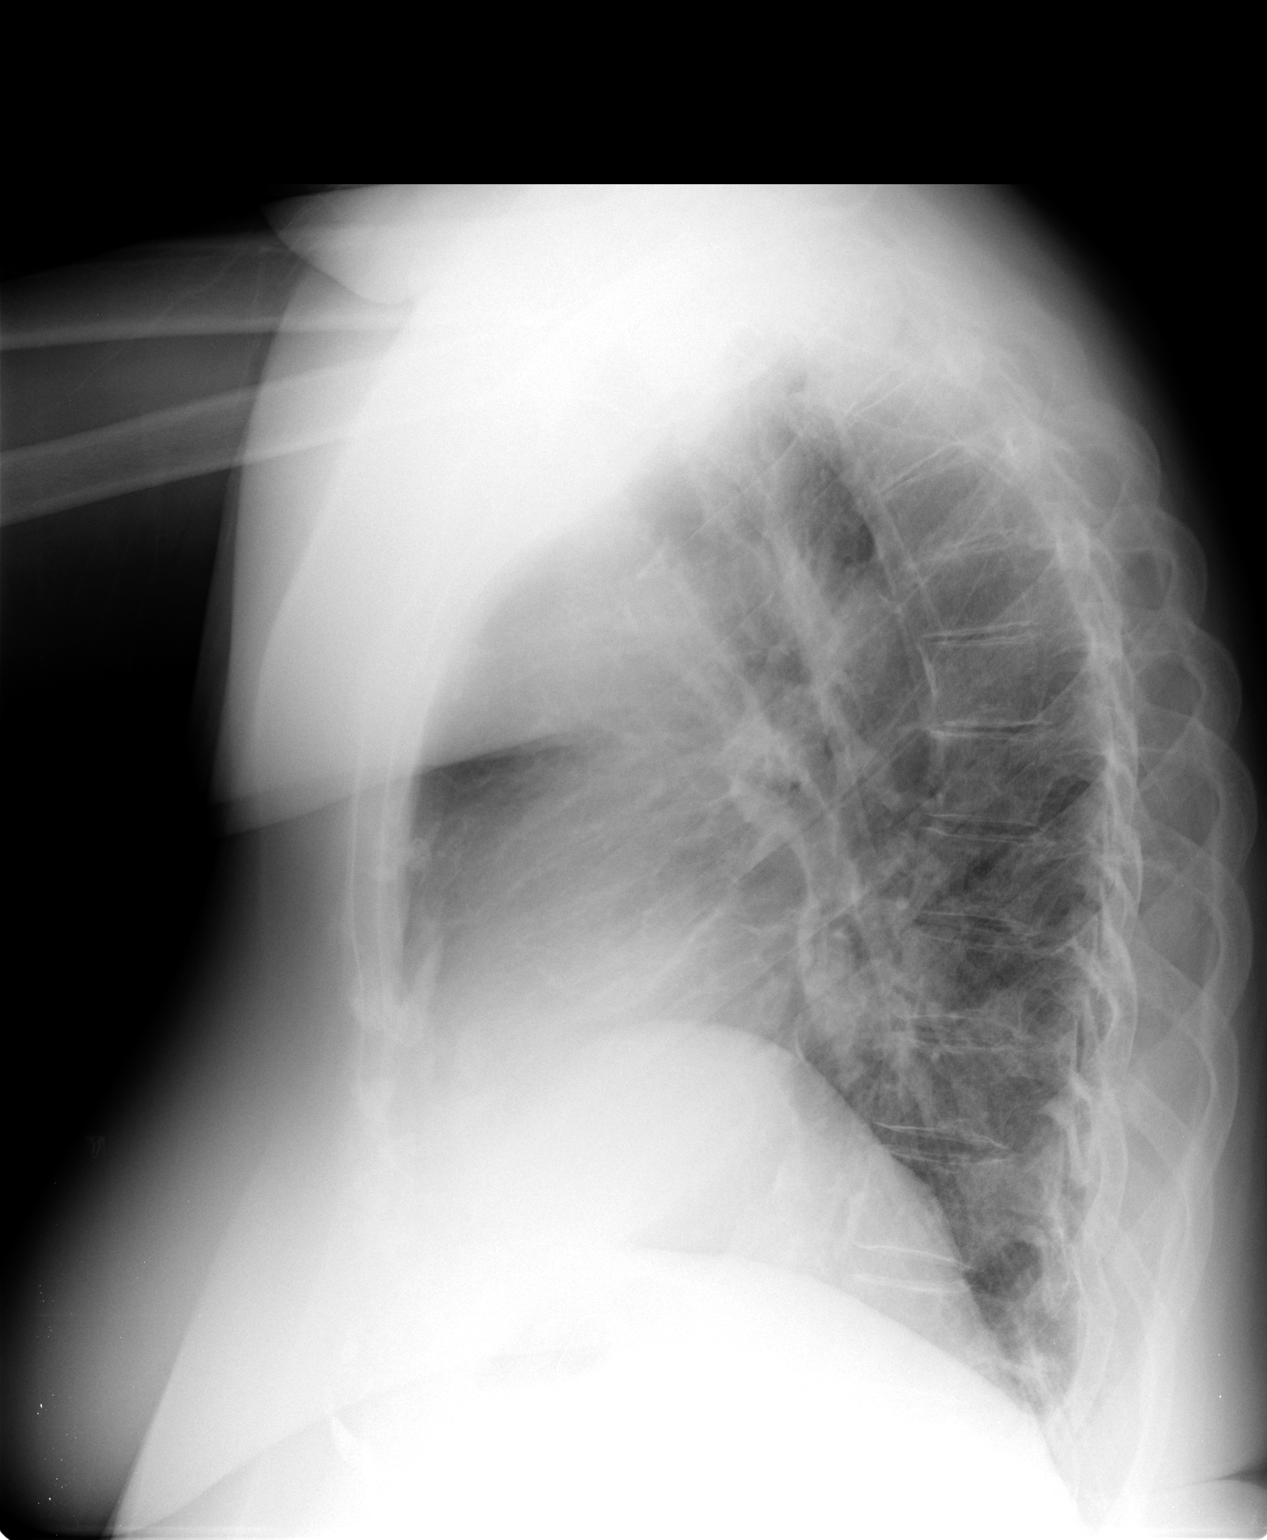

[2 of 2 positions shown; findings below may reference images not displayed]

FINDINGS: The heart size and mediastinal contours are within normal limits.
There is chronic elevation of right hemidiaphragm. There is no focal
infiltrate, pulmonary edema, or pleural effusion. The visualized
skeletal structures are stable.
IMPRESSION: No active cardiopulmonary disease.

## 2015-06-09 ENCOUNTER — Encounter: Payer: Self-pay | Admitting: Family

## 2015-06-09 ENCOUNTER — Non-Acute Institutional Stay (SKILLED_NURSING_FACILITY): Payer: Medicare Other | Admitting: Family

## 2015-06-09 DIAGNOSIS — E785 Hyperlipidemia, unspecified: Secondary | ICD-10-CM | POA: Diagnosis not present

## 2015-06-09 DIAGNOSIS — I480 Paroxysmal atrial fibrillation: Secondary | ICD-10-CM | POA: Diagnosis not present

## 2015-06-09 DIAGNOSIS — D631 Anemia in chronic kidney disease: Secondary | ICD-10-CM

## 2015-06-09 DIAGNOSIS — N183 Chronic kidney disease, stage 3 unspecified: Secondary | ICD-10-CM

## 2015-06-09 DIAGNOSIS — E039 Hypothyroidism, unspecified: Secondary | ICD-10-CM | POA: Diagnosis not present

## 2015-06-09 DIAGNOSIS — I110 Hypertensive heart disease with heart failure: Secondary | ICD-10-CM

## 2015-06-09 DIAGNOSIS — I5032 Chronic diastolic (congestive) heart failure: Secondary | ICD-10-CM

## 2015-06-09 DIAGNOSIS — N189 Chronic kidney disease, unspecified: Secondary | ICD-10-CM | POA: Diagnosis not present

## 2015-06-09 NOTE — Progress Notes (Signed)
Location:  [facility] Room Number: 503 A Place of Service:  SNF (31) Provider:  Richarda Blade, FNP-C [name], MD   [facility], MD  Patient Care Team: Donovan Kail, MD as PCP - General (Obstetrics and Gynecology)  Extended Emergency Contact Information Primary Emergency Contact: Jerde,Leroy Address: [address]          [place], [place] [address] [place] Home Phone: [phone] Mobile Phone: [phone] Relation: Spouse  Code Status:  Full Code  Goals of care: Advanced Directive information Advanced Directives 03/22/2015  Does patient have an advance directive? Yes  Copy of advanced directive(s) in chart? No - copy requested     Chief Complaint  Patient presents with  . Medical Management of Chronic Issues    Routine Visit    HPI:  Pt is a 82 y.o. female seen today at [facility]  for medical management of chronic diseases. She has a medical history of HTN,CHF, Hyperlipidemia, Hypothyroidism, CKD stage 4 among others.  She is seen in her room today. She states feeling tired after working with PT/OT.Her foley catheter recently removed 06/07/2015 by urologist recommended timed voiding, 48 oz fluid restriction, no fluids 3 hrs before bedtime and to avoid tea. She was also started on Myrbetriq 2 mg daily. She states urine incontinence.  Facility log B/p ranges in 110's/60's to 120's/40's. She denies any other any acute issues. dizziness, light headedness or fainting.    Past Medical History  Diagnosis Date  . GERD (gastroesophageal reflux disease)   . Glaucoma   . Osteoarthritis   . Hyperlipidemia   . Anxiety   . Depression   . Bronchitis 02/22/2011  . Chronic pain   . Hypothyroidism   . DOE (dyspnea on exertion)     chronic   . CKD (chronic kidney disease) stage 4, GFR 15-29 ml/min (HCC)   . Physical deconditioning   . Falls   . PAF (paroxysmal atrial fibrillation) (HCC)     not felt to be a  candidate for coumadin  . Hypertension   . CHF (congestive heart failure) (HCC)     felt to have diastolic dysfunction with normal EF at 60%  . Fibromyalgia     with chronic weakness   Past Surgical History  Procedure Laterality Date  . Hemorrhoid surgery    . Laparoscopic hysterectomy    . Knee surgery      bilateral  . Cesarean section    . Transthoracic echocardiogram  02/2011    EF 60% with diastolic dysfunction, high filling pressures  . Appendectomy      as a teenage  . Cholecystectomy      pt denies having cholecystectomy  . Femur im nail Right 03/23/2015    Procedure: INTRAMEDULLARY (IM) NAIL FEMORAL;  Surgeon: Durene Romans, MD;  Location: [facility];  Service: Orthopedics;  Laterality: Right;    Allergies  Allergen Reactions  . Penicillins Swelling and Rash      Medication List       This list is accurate as of: 06/09/15 12:55 PM.  Always use your most recent med list.               albuterol (2.5 MG/3ML) 0.083% nebulizer solution  Commonly known as:  PROVENTIL  Take 3 mLs (2.5 mg total) by nebulization every 4 (four) hours as needed for wheezing or shortness of breath.     amLODipine 5 MG tablet  Commonly known as:  NORVASC  Take 5 mg by mouth daily. Hold if SBP < 110     aspirin 325 MG EC tablet  Take 1 tablet (325 mg total) by mouth 2 (two) times daily.     atorvastatin 20 MG tablet  Commonly known as:  LIPITOR  Take 20 mg by mouth daily.     baclofen 10 MG tablet  Commonly known as:  LIORESAL  Take 1 ( )  Tablet at bedtime and every 8 hrs as needed     bisacodyl 5 MG EC tablet  Commonly known as:  DULCOLAX  Take 1 tablet (5 mg total) by mouth daily as needed for moderate constipation.     carvedilol 25 MG tablet  Commonly known as:  COREG  Take 25 mg by mouth 2 (two) times daily with a meal. Hold if sbp < 110     dorzolamide 2 % ophthalmic solution  Commonly known as:  TRUSOPT  Place 1 drop into both eyes 2 (two) times daily.     ferrous  sulfate 325 (65 FE) MG tablet  Take 325 mg by mouth daily.     FLUoxetine 40 MG capsule  Commonly known as:  PROZAC  Take 40 mg by mouth daily. Po in the am     fluticasone 50 MCG/ACT nasal spray  Commonly known as:  FLONASE  Place 2 sprays into both nostrils 2 (two) times daily as needed for allergies or rhinitis.     furosemide 40 MG tablet  Commonly known as:  LASIX  Take 40 mg by mouth daily. Hold if sbp < 110     guaiFENesin 600 MG 12 hr tablet  Commonly known as:  MUCINEX  Take 600 mg by mouth 2 (two) times daily.     HYDROcodone-acetaminophen 5-325 MG tablet  Commonly known as:  NORCO/VICODIN  Take 1 tablet by mouth every 6 (six) hours as needed for moderate pain.     levothyroxine 88 MCG tablet  Commonly known as:  SYNTHROID  Take 1 tablet (88 mcg total) by mouth daily before breakfast.     LORazepam 2 MG tablet  Commonly known as:  ATIVAN  Take  tablet every morning then take 2 mg tablet by mouth qhs for anxiety then 2 mg by mouth as needed in afternoon for severe anxiety.     magnesium oxide 400 MG tablet  Commonly known as:  MAG-OX  Take 800 mg by mouth daily.     multivitamin with minerals Tabs tablet  Take 1 tablet by mouth daily.     MYRBETRIQ 25 MG Tb24 tablet  Generic drug:  mirabegron ER  Take 25 mg by mouth daily.     nitrofurantoin 100 MG capsule  Commonly known as:  MACRODANTIN  Take 100 mg by mouth 2 (two) times daily. Stop date 05/13/15     nitroGLYCERIN 0.4 MG SL tablet  Commonly known as:  NITROSTAT  Place 0.4 mg under the tongue every 5 (five) minutes as needed for chest pain. If pain is unrelived after 3 doses, call MD     OLANZapine 5 MG tablet  Commonly known as:  ZYPREXA  Take 5 mg by mouth daily.     ondansetron 4 MG tablet  Commonly known as:  ZOFRAN  Take 4 mg by mouth every 6 (six) hours as needed for nausea or vomiting.     OXYGEN  Inhale into the lungs. Titrate 2-6 LPM to keep O2 stats > 93%     pantoprazole 40  MG  tablet  Commonly known as:  PROTONIX  Take 40 mg by mouth daily. 1 hour before breakfast     polyethylene glycol packet  Commonly known as:  MIRALAX / GLYCOLAX  Take 17 g by mouth daily.     potassium chloride SA 20 MEQ tablet  Commonly known as:  K-DUR,KLOR-CON  Take 20 mEq by mouth daily. Reported on 04/04/2015     prochlorperazine 5 MG tablet  Commonly known as:  COMPAZINE  Take 5 mg by mouth every 6 (six) hours as needed for nausea or vomiting.     senna-docusate 8.6-50 MG tablet  Commonly known as:  Senokot-S  Take 1 tablet by mouth at bedtime as needed for mild constipation.     VITAMIN D-3 PO  Take 1,000 Units by mouth daily.        Review of Systems  Constitutional: Negative for fever, chills, activity change and fatigue.  HENT: Negative for congestion, rhinorrhea, sinus pressure, sneezing and sore throat.   Eyes: Negative.   Respiratory: Negative for apnea, cough, shortness of breath and wheezing.   Gastrointestinal: Negative.   Genitourinary: Negative for urgency and flank pain.       Incotinent   Musculoskeletal: Positive for gait problem.       Right hip pain.   Skin: Negative.   Neurological: Negative for dizziness, seizures, syncope, light-headedness and headaches.  Psychiatric/Behavioral: Negative.     Immunization History  Administered Date(s) Administered  . Influenza Split 11/20/2010  . Influenza,inj,Quad PF,36+ Mos 01/02/2013  . PPD Test 03/23/2015, 04/19/2015  . Pneumococcal Polysaccharide-23 02/23/2011   Pertinent  Health Maintenance Due  Topic Date Due  . INFLUENZA VACCINE  02/20/2016 (Originally 09/20/2015)  . DEXA SCAN  02/20/2016 (Originally 07/12/1994)  . PNA vac Low Risk Adult (2 of 2 - PCV13) 02/20/2016 (Originally 02/23/2012)   No flowsheet data found. Functional Status Survey:    Filed Vitals:   06/09/15 1137  BP: 114/60  Pulse: 72  Temp: 97.9 F (36.6 C)  Resp: 20  Height: 5\' 10"  (1.778 m)  Weight: 200 lb (90.719 kg)  SpO2:  95%   Body mass index is 28.7 kg/(m^2). Physical Exam  Constitutional: She is oriented to person, place, and time. She appears well-developed and well-nourished. No distress.  HENT:  Head: Normocephalic.  Mouth/Throat: Oropharynx is clear and moist.  Eyes: Conjunctivae and EOM are normal. Pupils are equal, round, and reactive to light. Right eye exhibits no discharge. Left eye exhibits no discharge. No scleral icterus.  Neck: Normal range of motion. No JVD present. No thyromegaly present.  Cardiovascular: Normal rate, regular rhythm, normal heart sounds and intact distal pulses.  Exam reveals no gallop and no friction rub.   No murmur heard. Pulmonary/Chest: Effort normal and breath sounds normal. No respiratory distress. She has no wheezes. She has no rales.  Abdominal: Soft. Bowel sounds are normal. She exhibits no distension. There is no tenderness. There is no rebound and no guarding.  Genitourinary:  Incontinent  Musculoskeletal: She exhibits no edema or tenderness.  Bilateral LE strength decreased.   Lymphadenopathy:    She has no cervical adenopathy.  Neurological: She is oriented to person, place, and time.  Skin: Skin is warm and dry. No rash noted. She is not diaphoretic. No erythema. No pallor.  Psychiatric: She has a normal mood and affect.    Labs reviewed:  Recent Labs  03/30/15 0445 03/31/15 0402 04/01/15 0417 04/05/15 04/12/15  NA 140 140 140 143 140  K 4.3 4.2 4.1 4.4 4.5  CL 105 105 105  --   --   CO2 27 27 26   --   --   GLUCOSE 95 98 105*  --   --   BUN 36* 34* 30* 11 16  CREATININE 0.98 0.98 0.98 0.8 1.2*  CALCIUM 8.4* 8.5* 8.6*  --   --     Recent Labs  03/29/15 1226 03/30/15 0445 04/01/15 0417  AST 24 19 22   ALT 33 28 23  ALKPHOS 51 47 55  BILITOT 0.8 0.7 0.9  PROT 5.7* 5.1* 5.4*  ALBUMIN 2.9* 2.7* 2.8*    Recent Labs  03/28/15 0517 03/29/15 1226 03/30/15 0445 03/31/15 0402 04/01/15 0417 04/05/15 04/12/15  WBC 16.4* 16.9* 16.1* 15.5*  13.9* 11.2 7.9  NEUTROABS 11.1* 12.4* 11.0*  --   --   --   --   HGB 9.0* 8.9* 8.3* 8.6* 9.0* 8.6* 9.5*  HCT 28.5* 28.2* 25.4* 27.5* 29.4* 30* 32*  MCV 97.6 97.9 95.5 98.9 99.7  --   --   PLT 399 429* 366 421* 403* 404* 361   Lab Results  Component Value Date   TSH 2.617 03/23/2015   Lab Results  Component Value Date   HGBA1C 6.0* 03/30/2015   Lab Results  Component Value Date   CHOL 135 05/12/2015   HDL 31* 05/12/2015   LDLCALC 64 05/12/2015   TRIG 199* 05/12/2015    Significant Diagnostic Results in last 30 days:  No results found.  Assessment/Plan HTN/CHF Facility log B/p ranges in 110's/60's to 120's/40's.Decrease Amlodipine to 2.5 mg Tablet daily. Monitor B/p Bid X 5 days then resume previous orders.   AFib HR controlled. Continue to monitor.   CHF Asymtomatic. Continue on diuretic.Continue on oxygen via nasal cannula.  Monitor weight  Hypothyroidism  Continue on levothyroxine 88 mcg Tablet daily. Monitor TSH level.   CKD stage 3   Continue to control high risk factors. Continue on ARB  Hyperlipidemia  Continue on Atorvastatin 20 mg Tablet. Monitor lipid panel  Anemia  Continue on ferrous sulfate. Monitor CBC    Family/ staff Communication: Reviewed plan with patient and facility Nurse supervisor   Labs/tests ordered:  None

## 2015-06-27 ENCOUNTER — Other Ambulatory Visit: Payer: Self-pay

## 2015-06-27 MED ORDER — HYDROCODONE-ACETAMINOPHEN 5-325 MG PO TABS: 1.0000 | ORAL_TABLET | Freq: Four times a day (QID) | ORAL | Status: DC | PRN

## 2015-06-27 NOTE — Telephone Encounter (Signed)
Rx fax number [phone], phone number [phone]  

## 2015-06-30 ENCOUNTER — Encounter: Payer: Self-pay | Admitting: Family

## 2015-06-30 ENCOUNTER — Non-Acute Institutional Stay (SKILLED_NURSING_FACILITY): Payer: Medicare Other | Admitting: Family

## 2015-06-30 DIAGNOSIS — N390 Urinary tract infection, site not specified: Secondary | ICD-10-CM | POA: Diagnosis not present

## 2015-06-30 DIAGNOSIS — A499 Bacterial infection, unspecified: Secondary | ICD-10-CM | POA: Diagnosis not present

## 2015-06-30 MED ORDER — SACCHAROMYCES BOULARDII 250 MG PO CAPS: 250.0000 mg | ORAL_CAPSULE | Freq: Two times a day (BID) | ORAL | Status: DC

## 2015-06-30 MED ORDER — CIPROFLOXACIN HCL 500 MG PO TABS: 500.0000 mg | ORAL_TABLET | Freq: Two times a day (BID) | ORAL | Status: DC

## 2015-06-30 NOTE — Progress Notes (Signed)
Location:  [facility] Room Number: 503 A Place of Service:  SNF (31) Provider:  Richarda Blade, NP  Donovan Kail, MD  Patient Care Team: Donovan Kail, MD as PCP - General (Obstetrics and Gynecology)  Extended Emergency Contact Information Primary Emergency Contact: Falotico,Leroy Address: [address]          [place], [place] [address] [place] Home Phone: [phone] Mobile Phone: [phone] Relation: Spouse  Code Status: Full Code  Goals of care: Advanced Directive information Advanced Directives 03/22/2015  Does patient have an advance directive? Yes  Copy of advanced directive(s) in chart? No - copy requested     Chief Complaint  Patient presents with  . Acute Visit    Acute Concerns    HPI:  Debra Johnston is a 82 y.o. female seen today at [facility]  for an acute visit for follow up lab results. She is seen in her room today.Facility staff reports increased confusion. She was found on the floor 06/26/2015 after she attempted to get out of bed  Without any assistance. She states she was convinced she could walk by herself and make it to the bathroom. She did not participate in any therapy due to not feeling well. Her recent urine specimen results showed nitrite positive and moderate leukocytes. Urine cultures >100,000 colonies of citrobacter freundii.    Past Medical History  Diagnosis Date  . GERD (gastroesophageal reflux disease)   . Glaucoma   . Osteoarthritis   . Hyperlipidemia   . Anxiety   . Depression   . Bronchitis 02/22/2011  . Chronic pain   . Hypothyroidism   . DOE (dyspnea on exertion)     chronic   . CKD (chronic kidney disease) stage 4, GFR 15-29 ml/min (HCC)   . Physical deconditioning   . Falls   . PAF (paroxysmal atrial fibrillation) (HCC)     not felt to be a candidate for coumadin  . Hypertension   . CHF (congestive heart failure) (HCC)     felt to have diastolic dysfunction with normal  EF at 60%  . Fibromyalgia     with chronic weakness   Past Surgical History  Procedure Laterality Date  . Hemorrhoid surgery    . Laparoscopic hysterectomy    . Knee surgery      bilateral  . Cesarean section    . Transthoracic echocardiogram  02/2011    EF 60% with diastolic dysfunction, high filling pressures  . Appendectomy      as a teenage  . Cholecystectomy      Debra Johnston denies having cholecystectomy  . Femur im nail Right 03/23/2015    Procedure: INTRAMEDULLARY (IM) NAIL FEMORAL;  Surgeon: Durene Romans, MD;  Location: [facility];  Service: Orthopedics;  Laterality: Right;    Allergies  Allergen Reactions  . Penicillins Swelling and Rash      Medication List       This list is accurate as of: 06/30/15  1:40 PM.  Always use your most recent med list.               albuterol (2.5 MG/3ML) 0.083% nebulizer solution  Commonly known as:  PROVENTIL  Take 3 mLs (2.5 mg total) by nebulization every 4 (four) hours as needed for wheezing or shortness of breath.     amLODipine 5 MG tablet  Commonly known as:  NORVASC  Take 2.5 mg by mouth daily.     aspirin 325 MG  EC tablet  Take 1 tablet (325 mg total) by mouth 2 (two) times daily.     atorvastatin 20 MG tablet  Commonly known as:  LIPITOR  Take 20 mg by mouth daily.     baclofen 10 MG tablet  Commonly known as:  LIORESAL  Take 1 (10mg )  Tablet at bedtime and every 8 hrs as needed     bisacodyl 5 MG EC tablet  Commonly known as:  DULCOLAX  Take 1 tablet (5 mg total) by mouth daily as needed for moderate constipation.     carvedilol 25 MG tablet  Commonly known as:  COREG  Take 25 mg by mouth 2 (two) times daily with a meal. Hold if sbp < 110     CERTAVITE SENIOR/ANTIOXIDANT Tabs  Take 1 tablet by mouth daily.     dorzolamide 2 % ophthalmic solution  Commonly known as:  TRUSOPT  Place 1 drop into both eyes 2 (two) times daily.     ferrous sulfate 325 (65 FE) MG tablet  Take 325 mg by mouth daily.     FLUoxetine 40  MG capsule  Commonly known as:  PROZAC  Take 40 mg by mouth daily. Po in the am     fluticasone 50 MCG/ACT nasal spray  Commonly known as:  FLONASE  Place 2 sprays into both nostrils 2 (two) times daily as needed for allergies or rhinitis.     furosemide 40 MG tablet  Commonly known as:  LASIX  Take 40 mg by mouth daily. Hold if sbp < 110     guaiFENesin 600 MG 12 hr tablet  Commonly known as:  MUCINEX  Take 600 mg by mouth 2 (two) times daily.     HYDROcodone-acetaminophen 5-325 MG tablet  Commonly known as:  NORCO/VICODIN  Take 1 tablet by mouth every 6 (six) hours as needed for moderate pain.     levothyroxine 88 MCG tablet  Commonly known as:  SYNTHROID  Take 1 tablet (88 mcg total) by mouth daily before breakfast.     LORazepam 2 MG tablet  Commonly known as:  ATIVAN  Take 2mg  tablet every morning then take 2 mg tablet by mouth qhs for anxiety then 2 mg by mouth as needed in afternoon for severe anxiety.     magnesium oxide 400 MG tablet  Commonly known as:  MAG-OX  Take 800 mg by mouth daily.     MYRBETRIQ 25 MG Tb24 tablet  Generic drug:  mirabegron ER  Take 25 mg by mouth daily.     nitrofurantoin 100 MG capsule  Commonly known as:  MACRODANTIN  Take 100 mg by mouth 2 (two) times daily. Stop date 05/13/15     nitroGLYCERIN 0.4 MG SL tablet  Commonly known as:  NITROSTAT  Place 0.4 mg under the tongue every 5 (five) minutes as needed for chest pain. If pain is unrelived after 3 doses, call MD     OLANZapine 5 MG tablet  Commonly known as:  ZYPREXA  Take 5 mg by mouth daily.     OXYGEN  Inhale into the lungs. Titrate 2-6 LPM to keep O2 stats > 93%     pantoprazole 40 MG tablet  Commonly known as:  PROTONIX  Take 40 mg by mouth daily before breakfast. 1 hour before breakfast     polyethylene glycol packet  Commonly known as:  MIRALAX / GLYCOLAX  Take 17 g by mouth daily.     potassium chloride SA 20 MEQ  tablet  Commonly known as:  K-DUR,KLOR-CON  Take  20 mEq by mouth daily. Reported on 04/04/2015     prochlorperazine 5 MG tablet  Commonly known as:  COMPAZINE  Take 5 mg by mouth every 6 (six) hours as needed for nausea or vomiting.     senna-docusate 8.6-50 MG tablet  Commonly known as:  Senokot-S  Take 1 tablet by mouth at bedtime as needed for mild constipation.     VITAMIN D-3 PO  Take 1,000 Units by mouth daily.        Review of Systems  Constitutional: Negative for fever, chills, activity change, appetite change and fatigue.  HENT: Negative for congestion, rhinorrhea, sneezing and sore throat.   Eyes: Negative.   Respiratory: Negative for cough, chest tightness, shortness of breath and wheezing.   Cardiovascular: Negative for chest pain, palpitations and leg swelling.  Gastrointestinal: Negative for nausea, vomiting, abdominal pain, diarrhea, constipation and abdominal distention.  Genitourinary: Negative for urgency and flank pain.  Musculoskeletal: Positive for gait problem. Negative for back pain.  Skin: Negative.   Neurological: Positive for light-headedness and headaches.  Psychiatric/Behavioral: Positive for confusion. Negative for hallucinations, sleep disturbance and agitation.    Immunization History  Administered Date(s) Administered  . Influenza Split 11/20/2010  . Influenza,inj,Quad PF,36+ Mos 01/02/2013  . PPD Test 03/23/2015, 04/19/2015  . Pneumococcal Polysaccharide-23 02/23/2011   Pertinent  Health Maintenance Due  Topic Date Due  . INFLUENZA VACCINE  02/20/2016 (Originally 09/20/2015)  . DEXA SCAN  02/20/2016 (Originally 07/12/1994)  . PNA vac Low Risk Adult (2 of 2 - PCV13) 02/20/2016 (Originally 02/23/2012)   No flowsheet data found. Functional Status Survey:    There were no vitals filed for this visit. There is no weight on file to calculate BMI. Physical Exam  Constitutional: She appears well-developed and well-nourished. No distress.  HENT:  Head: Normocephalic.  Mouth/Throat: Oropharynx  is clear and moist.  Eyes: Conjunctivae and EOM are normal. Pupils are equal, round, and reactive to light. Right eye exhibits no discharge. Left eye exhibits no discharge. No scleral icterus.  Neck: Normal range of motion. No JVD present. No thyromegaly present.  Cardiovascular: Normal rate, regular rhythm, normal heart sounds and intact distal pulses.  Exam reveals no gallop and no friction rub.   No murmur heard. Pulmonary/Chest: Effort normal and breath sounds normal. No respiratory distress. She has no wheezes. She has no rales.  Abdominal: Soft. Bowel sounds are normal. She exhibits no distension. There is no tenderness. There is no rebound.  Musculoskeletal: She exhibits no edema or tenderness.  Bilateral lower extremities weakness.   Lymphadenopathy:    She has no cervical adenopathy.  Neurological: She is alert.  Skin: Skin is warm and dry. No rash noted. No erythema. No pallor.  Psychiatric: She has a normal mood and affect.    Labs reviewed:  Recent Labs  03/30/15 0445 03/31/15 0402 04/01/15 0417 04/05/15 04/12/15  NA 140 140 140 143 140  K 4.3 4.2 4.1 4.4 4.5  CL 105 105 105  --   --   CO2 27 27 26   --   --   GLUCOSE 95 98 105*  --   --   BUN 36* 34* 30* 11 16  CREATININE 0.98 0.98 0.98 0.8 1.2*  CALCIUM 8.4* 8.5* 8.6*  --   --     Recent Labs  03/29/15 1226 03/30/15 0445 04/01/15 0417  AST 24 19 22   ALT 33 28 23  ALKPHOS 51 47  55  BILITOT 0.8 0.7 0.9  PROT 5.7* 5.1* 5.4*  ALBUMIN 2.9* 2.7* 2.8*    Recent Labs  03/28/15 0517 03/29/15 1226 03/30/15 0445 03/31/15 0402 04/01/15 0417 04/05/15 04/12/15  WBC 16.4* 16.9* 16.1* 15.5* 13.9* 11.2 7.9  NEUTROABS 11.1* 12.4* 11.0*  --   --   --   --   HGB 9.0* 8.9* 8.3* 8.6* 9.0* 8.6* 9.5*  HCT 28.5* 28.2* 25.4* 27.5* 29.4* 30* 32*  MCV 97.6 97.9 95.5 98.9 99.7  --   --   PLT 399 429* 366 421* 403* 404* 361   Lab Results  Component Value Date   TSH 2.617 03/23/2015   Lab Results  Component Value Date     HGBA1C 6.0* 03/30/2015   Lab Results  Component Value Date   CHOL 135 05/12/2015   HDL 31* 05/12/2015   LDLCALC 64 05/12/2015   TRIG 199* 05/12/2015    Significant Diagnostic Results in last 30 days:  No results found.  Assessment/Plan Urinary Tract infections  Afebrile. Increased confusion. urine specimen results showed nitrite positive and moderate leukocytes. Urine cultures >100,000 colonies of citrobacter freundii. Start Cipro 500 mg Tablet one by mouth twice daily x 7 days. Florastor 250 mg Capsule twice daily X 10 days.    Family/ staff Communication: Reviewed plan of care with patient and facility Nurse supervisor.   Labs/tests ordered:  None

## 2015-07-04 ENCOUNTER — Encounter: Payer: Self-pay | Admitting: Family

## 2015-07-04 ENCOUNTER — Non-Acute Institutional Stay (SKILLED_NURSING_FACILITY): Payer: Medicare Other | Admitting: Family

## 2015-07-04 DIAGNOSIS — I5032 Chronic diastolic (congestive) heart failure: Secondary | ICD-10-CM | POA: Diagnosis not present

## 2015-07-04 DIAGNOSIS — S72141D Displaced intertrochanteric fracture of right femur, subsequent encounter for closed fracture with routine healing: Secondary | ICD-10-CM

## 2015-07-04 DIAGNOSIS — I110 Hypertensive heart disease with heart failure: Secondary | ICD-10-CM | POA: Diagnosis not present

## 2015-07-04 DIAGNOSIS — E46 Unspecified protein-calorie malnutrition: Secondary | ICD-10-CM

## 2015-07-04 DIAGNOSIS — E039 Hypothyroidism, unspecified: Secondary | ICD-10-CM | POA: Diagnosis not present

## 2015-07-04 NOTE — Progress Notes (Signed)
Patient ID: Debra Johnston, female   DOB: 05-22-1927, 82 y.o.   MRN: [id number]  Location:  [facility] Room Number: 503-A Place of Service:  SNF (31) Provider:Lu Paradise FNP-C [name], MD   Donovan Kail, MD  Patient Care Team: Donovan Kail, MD as PCP - General (Obstetrics and Gynecology)  Extended Emergency Contact Information Primary Emergency Contact: Nolt,Leroy Address: [address]          [place], [place] [address] [place] Home Phone: [phone] Mobile Phone: [phone] Relation: Spouse  Code Status:  Full Code Goals of care: Advanced Directive information Advanced Directives 03/22/2015  Does patient have an advance directive? Yes  Copy of advanced directive(s) in chart? No - copy requested     Chief Complaint  Patient presents with  . Medical Management of Chronic Issues    Routine Visit    HPI:  Pt is a 82 y.o. female seen today at [facility] for medical management of chronic diseases. She has a medical history of HTN, CHF, Hypothyroidism, CKD, Abnormal gait among others. She is seen in her room today watching TV in the bed. She denies any acute issues this visit. She reports no recent fall episodes since the previous one. She has been using call bell to call for assistance. She is currently on Cipro for recent UTI. Facility staff reports no new concerns.     Past Medical History  Diagnosis Date  . GERD (gastroesophageal reflux disease)   . Glaucoma   . Osteoarthritis   . Hyperlipidemia   . Anxiety   . Depression   . Bronchitis 02/22/2011  . Chronic pain   . Hypothyroidism   . DOE (dyspnea on exertion)     chronic   . CKD (chronic kidney disease) stage 4, GFR 15-29 ml/min (HCC)   . Physical deconditioning   . Falls   . PAF (paroxysmal atrial fibrillation) (HCC)     not felt to be a candidate for coumadin  . Hypertension   . CHF (congestive heart failure) (HCC)     felt to  have diastolic dysfunction with normal EF at 60%  . Fibromyalgia     with chronic weakness   Past Surgical History  Procedure Laterality Date  . Hemorrhoid surgery    . Laparoscopic hysterectomy    . Knee surgery      bilateral  . Cesarean section    . Transthoracic echocardiogram  02/2011    EF 60% with diastolic dysfunction, high filling pressures  . Appendectomy      as a teenage  . Cholecystectomy      pt denies having cholecystectomy  . Femur im nail Right 03/23/2015    Procedure: INTRAMEDULLARY (IM) NAIL FEMORAL;  Surgeon: Durene Romans, MD;  Location: [facility];  Service: Orthopedics;  Laterality: Right;    Allergies  Allergen Reactions  . Penicillins Swelling and Rash      Medication List       This list is accurate as of: 07/04/15  6:38 PM.  Always use your most recent med list.               albuterol (2.5 MG/3ML) 0.083% nebulizer solution  Commonly known as:  PROVENTIL  Take 3 mLs (2.5 mg total) by nebulization every 4 (four) hours as needed for wheezing or shortness of breath.     amLODipine 5 MG tablet  Commonly known as:  NORVASC  Take 2.5 mg by  mouth daily.     aspirin EC 325 MG tablet  Take 325 mg by mouth 2 (two) times daily.     atorvastatin 20 MG tablet  Commonly known as:  LIPITOR  Take 20 mg by mouth daily.     baclofen 10 MG tablet  Commonly known as:  LIORESAL  Take 1 (10mg )  Tablet at bedtime and every 8 hrs as needed     bisacodyl 5 MG EC tablet  Commonly known as:  DULCOLAX  Take 1 tablet (5 mg total) by mouth daily as needed for moderate constipation.     carvedilol 25 MG tablet  Commonly known as:  COREG  Take 25 mg by mouth 2 (two) times daily with a meal. Hold if sbp < 110     CERTAVITE SENIOR/ANTIOXIDANT Tabs  Take 1 tablet by mouth daily.     ciprofloxacin 500 MG tablet  Commonly known as:  CIPRO  Take 1 tablet (500 mg total) by mouth 2 (two) times daily.     dorzolamide 2 % ophthalmic solution  Commonly known as:  TRUSOPT   Place 1 drop into both eyes 2 (two) times daily.     ferrous sulfate 325 (65 FE) MG tablet  Take 325 mg by mouth daily.     FLUoxetine 40 MG capsule  Commonly known as:  PROZAC  Take 40 mg by mouth daily. Po in the am     fluticasone 50 MCG/ACT nasal spray  Commonly known as:  FLONASE  Place 2 sprays into both nostrils 2 (two) times daily as needed for allergies or rhinitis.     furosemide 40 MG tablet  Commonly known as:  LASIX  Take 40 mg by mouth daily. Hold if sbp < 110     guaiFENesin 600 MG 12 hr tablet  Commonly known as:  MUCINEX  Take 600 mg by mouth 2 (two) times daily.     HYDROcodone-acetaminophen 5-325 MG tablet  Commonly known as:  NORCO/VICODIN  Take 1 tablet by mouth every 6 (six) hours as needed for moderate pain.     levothyroxine 88 MCG tablet  Commonly known as:  SYNTHROID  Take 1 tablet (88 mcg total) by mouth daily before breakfast.     LORazepam 2 MG tablet  Commonly known as:  ATIVAN  Take 2mg  tablet every morning then take 2 mg tablet by mouth qhs for anxiety then 2 mg by mouth as needed in afternoon for severe anxiety.     magnesium oxide 400 MG tablet  Commonly known as:  MAG-OX  Take 800 mg by mouth daily.     MYRBETRIQ 25 MG Tb24 tablet  Generic drug:  mirabegron ER  Take 25 mg by mouth daily.     nitrofurantoin 100 MG capsule  Commonly known as:  MACRODANTIN  Take 100 mg by mouth 2 (two) times daily. Stop date 05/13/15     nitroGLYCERIN 0.4 MG SL tablet  Commonly known as:  NITROSTAT  Place 0.4 mg under the tongue every 5 (five) minutes as needed for chest pain. If pain is unrelived after 3 doses, call MD     OLANZapine 5 MG tablet  Commonly known as:  ZYPREXA  Take 5 mg by mouth daily.     OXYGEN  Inhale into the lungs. Titrate 2-6 LPM to keep O2 stats > 93%     pantoprazole 40 MG tablet  Commonly known as:  PROTONIX  Take 40 mg by mouth daily before breakfast. 1 hour  before breakfast     polyethylene glycol packet  Commonly  known as:  MIRALAX / GLYCOLAX  Take 17 g by mouth daily.     potassium chloride SA 20 MEQ tablet  Commonly known as:  K-DUR,KLOR-CON  Take 20 mEq by mouth daily. Reported on 04/04/2015     prochlorperazine 5 MG tablet  Commonly known as:  COMPAZINE  Take 5 mg by mouth every 6 (six) hours as needed for nausea or vomiting.     saccharomyces boulardii 250 MG capsule  Commonly known as:  FLORASTOR  Take 1 capsule (250 mg total) by mouth 2 (two) times daily.     senna-docusate 8.6-50 MG tablet  Commonly known as:  Senokot-S  Take 1 tablet by mouth at bedtime as needed for mild constipation.     VITAMIN D-3 PO  Take 1,000 Units by mouth daily.        Review of Systems  Constitutional: Negative for fever, chills, activity change, appetite change and fatigue.  HENT: Negative for congestion, rhinorrhea, sinus pressure and sore throat.   Eyes: Negative.   Respiratory: Negative for cough, chest tightness and wheezing.        Oxygen via Nasal cannula  Cardiovascular: Negative for chest pain, palpitations and leg swelling.  Gastrointestinal: Negative for nausea, vomiting, diarrhea, constipation and abdominal distention.  Endocrine: Negative.   Genitourinary: Negative for dysuria, urgency, hematuria and flank pain.  Musculoskeletal: Positive for gait problem.  Skin: Negative for color change, pallor, rash and wound.  Neurological: Negative for dizziness, seizures, light-headedness and headaches.  Hematological: Does not bruise/bleed easily.  Psychiatric/Behavioral: Negative for hallucinations, confusion, sleep disturbance and agitation. The patient is not nervous/anxious.     Immunization History  Administered Date(s) Administered  . Influenza Split 11/20/2010  . Influenza,inj,Quad PF,36+ Mos 01/02/2013  . PPD Test 03/23/2015, 04/19/2015  . Pneumococcal Polysaccharide-23 02/23/2011   Pertinent  Health Maintenance Due  Topic Date Due  . INFLUENZA VACCINE  02/20/2016 (Originally  09/20/2015)  . DEXA SCAN  02/20/2016 (Originally 07/12/1994)  . PNA vac Low Risk Adult (2 of 2 - PCV13) 02/20/2016 (Originally 02/23/2012)   No flowsheet data found. Functional Status Survey:    Filed Vitals:   07/04/15 1145  BP: 135/53  Pulse: 69  Temp: 97 F (36.1 C)  TempSrc: Oral  Resp: 20  Height: 5\' 5"  (1.651 m)  Weight: 222 lb 12.8 oz (101.061 kg)  SpO2: 97%   Body mass index is 37.08 kg/(m^2). Physical Exam  Constitutional: She appears well-developed and well-nourished. No distress.  HENT:  Head: Normocephalic.  Mouth/Throat: Oropharynx is clear and moist.  Eyes: Conjunctivae and EOM are normal. Pupils are equal, round, and reactive to light. Right eye exhibits no discharge. Left eye exhibits no discharge. No scleral icterus.  Neck: Normal range of motion. No JVD present. No thyromegaly present.  Cardiovascular: Normal rate, regular rhythm and intact distal pulses.  Exam reveals no gallop and no friction rub.   No murmur heard. Pulmonary/Chest: Effort normal and breath sounds normal. No respiratory distress. She has no wheezes. She has no rales.  Oxygen via 2 liters [place]   Abdominal: Soft. Bowel sounds are normal. She exhibits no distension. There is no tenderness. There is no rebound and no guarding.  Musculoskeletal: She exhibits no edema or tenderness.  Lymphadenopathy:    She has no cervical adenopathy.  Neurological: She is alert.  Skin: Skin is warm and dry. No rash noted. No erythema. No pallor.  Psychiatric: She has  a normal mood and affect.    Labs reviewed:  Recent Labs  03/30/15 0445 03/31/15 0402 04/01/15 0417 04/05/15 04/12/15  NA 140 140 140 143 140  K 4.3 4.2 4.1 4.4 4.5  CL 105 105 105  --   --   CO2 --   --   GLUCOSE 95 98 105*  --   --   BUN 36* 34* 30* 11 16  CREATININE 0.98 0.98 0.98 0.8 1.2*  CALCIUM 8.4* 8.5* 8.6*  --   --     Recent Labs  03/29/15 1226 03/30/15 0445 04/01/15 0417  AST ALT 33 28 23  ALKPHOS 51  47 55  BILITOT 0.8 0.7 0.9  PROT 5.7* 5.1* 5.4*  ALBUMIN 2.9* 2.7* 2.8*    Recent Labs  03/28/15 0517 03/29/15 1226 03/30/15 0445 03/31/15 0402 04/01/15 0417 04/05/15 04/12/15  WBC 16.4* 16.9* 16.1* 15.5* 13.9* 11.2 7.9  NEUTROABS 11.1* 12.4* 11.0*  --   --   --   --   HGB 9.0* 8.9* 8.3* 8.6* 9.0* 8.6* 9.5*  HCT 28.5* 28.2* 25.4* 27.5* 29.4* 30* 32*  MCV 97.6 97.9 95.5 98.9 99.7  --   --   PLT 399 429* 366 421* 403* 404* 361   Lab Results  Component Value Date   TSH 2.617 03/23/2015   Lab Results  Component Value Date   HGBA1C 6.0* 03/30/2015   Lab Results  Component Value Date   CHOL 135 05/12/2015   HDL 31* 05/12/2015   LDLCALC 64 05/12/2015   TRIG 199* 05/12/2015   Assessment/Plan 1. Hypertensive heart disease with CHF (congestive heart failure) (HCC) B/p stable. Continue current medication. Monitor BMP  2. Chronic diastolic CHF (congestive heart failure) (HCC) No weight gain. Exam findings negative for cough, edema, shortness of breath or wheezing. Continue Furosemide 40 mg Tablet. Continue on oxygen 2 liters via [place].Daily weight. Monitor BMP  3. Hypothyroidism, unspecified hypothyroidism type Stable. Continue on Levothyroxine  4. Intertrochanteric fracture of right hip, closed, with routine healing, subsequent encounter Continue with PT/OT fall and safety precautions. Follow up with Alice Rieger as directed.   5. Protein-calorie malnutrition (HCC) Appetite has improved. Monitor BMP     Family/ staff Communication: Reviewed plan of care with patient and facility Nurse supervisor.   Labs/tests ordered:  None

## 2015-07-08 ENCOUNTER — Non-Acute Institutional Stay (SKILLED_NURSING_FACILITY): Payer: Medicare Other | Admitting: Family

## 2015-07-08 ENCOUNTER — Encounter: Payer: Self-pay | Admitting: Family

## 2015-07-08 DIAGNOSIS — D509 Iron deficiency anemia, unspecified: Secondary | ICD-10-CM

## 2015-07-08 DIAGNOSIS — E039 Hypothyroidism, unspecified: Secondary | ICD-10-CM

## 2015-07-08 DIAGNOSIS — E785 Hyperlipidemia, unspecified: Secondary | ICD-10-CM

## 2015-07-08 NOTE — Progress Notes (Signed)
Patient ID: Debra Johnston, female   DOB: 11/09/1927, 82 y.o.   MRN: [id number]  Location:  [facility] Room Number: 503 A Place of Service:  SNF (31) Provider: Dinah Ngetich FNP-C  [name], MD  Patient Care Team: [name], MD as PCP - General (Internal Medicine)  Extended Emergency Contact Information Primary Emergency Contact: Santore,Leroy Address: [address]          [place], [place] [address] [place] Home Phone: [phone] Mobile Phone: [phone] Relation: Spouse  Code Status: Full Code Goals of care: Advanced Directive information Advanced Directives 07/08/2015  Does patient have an advance directive? No  Copy of advanced directive(s) in chart? -     Chief Complaint  Patient presents with  . Acute Visit    Hair loss    HPI:  Pt is a 82 y.o. female seen today at [facility] for an acute visit for evaluation of hair loss. She has a significant history of Hypothyroidism, HTN, Hyperlipidemia, Obesity, Anemia, CKD stage 3, Firomyalgia, CHF, Depression among others. She is seen in her room today lying in bed. She states every time she brushes her hair She gets a large clump of hair  Out. She verbalized feeling depressed about loosing her hair. She wonders whether her medication is causing to loss hair.    Past Medical History  Diagnosis Date  . GERD (gastroesophageal reflux disease)   . Glaucoma   . Osteoarthritis   . Hyperlipidemia   . Anxiety   . Depression   . Bronchitis 02/22/2011  . Chronic pain   . Hypothyroidism   . DOE (dyspnea on exertion)     chronic   . CKD (chronic kidney disease) stage 4, GFR 15-29 ml/min (HCC)   . Physical deconditioning   . Falls   . PAF (paroxysmal atrial fibrillation) (HCC)     not felt to be a candidate for coumadin  . Hypertension   . CHF (congestive heart failure) (HCC)     felt to have diastolic dysfunction with normal EF at 60%  .  Fibromyalgia     with chronic weakness   Past Surgical History  Procedure Laterality Date  . Hemorrhoid surgery    . Laparoscopic hysterectomy    . Knee surgery      bilateral  . Cesarean section    . Transthoracic echocardiogram  02/2011    EF 60% with diastolic dysfunction, high filling pressures  . Appendectomy      as a teenage  . Cholecystectomy      pt denies having cholecystectomy  . Femur im nail Right 03/23/2015    Procedure: INTRAMEDULLARY (IM) NAIL FEMORAL;  Surgeon: Durene Romans, MD;  Location: [facility];  Service: Orthopedics;  Laterality: Right;    Allergies  Allergen Reactions  . Penicillins Swelling and Rash      Medication List       This list is accurate as of: 07/08/15 12:42 PM.  Always use your most recent med list.               albuterol (2.5 MG/3ML) 0.083% nebulizer solution  Commonly known as:  PROVENTIL  Take 3 mLs (2.5 mg total) by nebulization every 4 (four) hours as needed for wheezing or shortness of breath.     amLODipine 5 MG tablet  Commonly known as:  NORVASC  Take 2.5 mg by mouth daily.     aspirin EC 325 MG tablet  Take 325 mg by mouth 2 (two) times daily.     atorvastatin 20 MG tablet  Commonly known as:  LIPITOR  Take 20 mg by mouth daily.     baclofen 10 MG tablet  Commonly known as:  LIORESAL  Take 1 (10mg )  Tablet at bedtime and every 8 hrs as needed     bisacodyl 5 MG EC tablet  Commonly known as:  DULCOLAX  Take 1 tablet (5 mg total) by mouth daily as needed for moderate constipation.     carvedilol 25 MG tablet  Commonly known as:  COREG  Take 25 mg by mouth 2 (two) times daily with a meal. Hold if sbp < 110     CERTAVITE SENIOR/ANTIOXIDANT Tabs  Take 1 tablet by mouth daily.     dorzolamide 2 % ophthalmic solution  Commonly known as:  TRUSOPT  Place 1 drop into both eyes 2 (two) times daily.     ferrous sulfate 325 (65 FE) MG tablet  Take 325 mg by mouth daily.     FLUoxetine 40 MG capsule  Commonly known  as:  PROZAC  Take 40 mg by mouth daily. Po in the am     fluticasone 50 MCG/ACT nasal spray  Commonly known as:  FLONASE  Place 2 sprays into both nostrils 2 (two) times daily as needed for allergies or rhinitis.     furosemide 40 MG tablet  Commonly known as:  LASIX  Take 40 mg by mouth daily. Hold if sbp < 110     guaiFENesin 600 MG 12 hr tablet  Commonly known as:  MUCINEX  Take 600 mg by mouth 2 (two) times daily.     HYDROcodone-acetaminophen 5-325 MG tablet  Commonly known as:  NORCO/VICODIN  Take 1 tablet by mouth every 6 (six) hours as needed for moderate pain.     levothyroxine 88 MCG tablet  Commonly known as:  SYNTHROID  Take 1 tablet (88 mcg total) by mouth daily before breakfast.     LORazepam 2 MG tablet  Commonly known as:  ATIVAN  Take 2mg  tablet every morning then take 2 mg tablet by mouth qhs for anxiety then 2 mg by mouth as needed in afternoon for severe anxiety.     magnesium oxide 400 MG tablet  Commonly known as:  MAG-OX  Take 800 mg by mouth daily.     MYRBETRIQ 25 MG Tb24 tablet  Generic drug:  mirabegron ER  Take 25 mg by mouth daily.     nitroGLYCERIN 0.4 MG SL tablet  Commonly known as:  NITROSTAT  Place 0.4 mg under the tongue every 5 (five) minutes as needed for chest pain. If pain is unrelived after 3 doses, call MD     OLANZapine 5 MG tablet  Commonly known as:  ZYPREXA  Take 5 mg by mouth daily.     OXYGEN  Inhale into the lungs. Titrate 2-6 LPM to keep O2 stats > 93%     pantoprazole 40 MG tablet  Commonly known as:  PROTONIX  Take 40 mg by mouth daily before breakfast. 1 hour before breakfast     polyethylene glycol packet  Commonly known as:  MIRALAX / GLYCOLAX  Take 17 g by mouth daily.     potassium chloride SA 20 MEQ tablet  Commonly known as:  K-DUR,KLOR-CON  Take 20 mEq by mouth daily. Reported on 04/04/2015     prochlorperazine 5 MG tablet  Commonly known as:  COMPAZINE  Take  5 mg by mouth every 6 (six) hours as  needed for nausea or vomiting.     saccharomyces boulardii 250 MG capsule  Commonly known as:  FLORASTOR  Take 1 capsule (250 mg total) by mouth 2 (two) times daily.     senna-docusate 8.6-50 MG tablet  Commonly known as:  Senokot-S  Take 1 tablet by mouth at bedtime as needed for mild constipation.     VITAMIN D-3 PO  Take 1,000 Units by mouth daily.        Review of Systems  Constitutional: Negative for fever, chills, activity change, appetite change and fatigue.  HENT: Negative.   Eyes: Negative.   Respiratory: Negative for cough, chest tightness, wheezing and stridor.   Cardiovascular: Negative.   Gastrointestinal: Negative.   Endocrine: Negative for cold intolerance, heat intolerance, polydipsia, polyphagia and polyuria.  Musculoskeletal: Positive for gait problem.  Skin: Negative for color change, pallor and rash.  Psychiatric/Behavioral: Negative for hallucinations, confusion, sleep disturbance and agitation.    Immunization History  Administered Date(s) Administered  . Influenza Split 11/20/2010  . Influenza,inj,Quad PF,36+ Mos 01/02/2013  . PPD Test 03/23/2015, 04/19/2015  . Pneumococcal Polysaccharide-23 02/23/2011   Pertinent  Health Maintenance Due  Topic Date Due  . INFLUENZA VACCINE  02/20/2016 (Originally 09/20/2015)  . DEXA SCAN  02/20/2016 (Originally 07/12/1994)  . PNA vac Low Risk Adult (2 of 2 - PCV13) 02/20/2016 (Originally 02/23/2012)   No flowsheet data found. Functional Status Survey:    Filed Vitals:   07/08/15 1109  BP: 132/57  Pulse: 64  Temp: 96.8 F (36 C)  TempSrc: Oral  Resp: 20  Height:  (1.651 m)  Weight: 221 lb 9.6 oz (100.517 kg)  SpO2: 96%   Body mass index is 36.88 kg/(m^2). Physical Exam  Constitutional: She is oriented to person, place, and time. She appears well-nourished. No distress.  obese  HENT:  Head: Normocephalic.  Mouth/Throat: Oropharynx is clear and moist.  Eyes: Conjunctivae and EOM are normal. Pupils  are equal, round, and reactive to light. Right eye exhibits no discharge. Left eye exhibits no discharge. No scleral icterus.  Neck: Normal range of motion. No JVD present.  Cardiovascular: Normal rate, regular rhythm, normal heart sounds and intact distal pulses.  Exam reveals no gallop and no friction rub.   No murmur heard. Pulmonary/Chest: Effort normal and breath sounds normal. No respiratory distress. She has no wheezes. She has no rales.  Abdominal: Soft. Bowel sounds are normal. She exhibits no distension. There is tenderness. There is no rebound and no guarding.  Lymphadenopathy:    She has no cervical adenopathy.  Neurological: She is oriented to person, place, and time.  Skin: Skin is warm and dry. No rash noted. No erythema. No pallor.  Psychiatric:  Tearful     Labs reviewed:  Recent Labs  03/30/15 0445 03/31/15 0402 04/01/15 0417 04/05/15 04/12/15  NA 140 140 140 143 140  K 4.3 4.2 4.1 4.4 4.5  CL 105 105 105  --   --   CO2 --   --   GLUCOSE 95 98 105*  --   --   BUN 36* 34* 30* 11 16  CREATININE 0.98 0.98 0.98 0.8 1.2*  CALCIUM 8.4* 8.5* 8.6*  --   --     Recent Labs  03/29/15 1226 03/30/15 0445 04/01/15 0417  AST ALT 33 28 23  ALKPHOS 51 47 55  BILITOT 0.8 0.7 0.9  PROT 5.7* 5.1* 5.4*  ALBUMIN 2.9* 2.7* 2.8*    Recent Labs  03/28/15 0517 03/29/15 1226 03/30/15 0445 03/31/15 0402 04/01/15 0417 04/05/15 04/12/15  WBC 16.4* 16.9* 16.1* 15.5* 13.9* 11.2 7.9  NEUTROABS 11.1* 12.4* 11.0*  --   --   --   --   HGB 9.0* 8.9* 8.3* 8.6* 9.0* 8.6* 9.5*  HCT 28.5* 28.2* 25.4* 27.5* 29.4* 30* 32*  MCV 97.6 97.9 95.5 98.9 99.7  --   --   PLT 399 429* 366 421* 403* 404* 361   Lab Results  Component Value Date   TSH 2.617 03/23/2015   Lab Results  Component Value Date   HGBA1C 6.0* 03/30/2015   Lab Results  Component Value Date   CHOL 135 05/12/2015   HDL 31* 05/12/2015   LDLCALC 64 05/12/2015   TRIG 199* 05/12/2015    Assessment/Plan 1. Hypothyroidism, unspecified hypothyroidism type  hair loss reported. Obtain TSH level 07/11/2015  2. Iron deficiency anemia Previous Hgb 8.6. Recheck CBC  4. Morbid obesity, unspecified obesity type (HCC) Limited exercise due to recent right hip intertrochanteric fracture. Continue PT. Recheck Hgb A1C 07/11/2015     Family/ staff Communication: Reviewed plan of care with patient and facility Nurse supervisor.   Labs/tests ordered: CBC, BMP, Hgb A1C, TSH level 07/11/2015

## 2015-07-11 LAB — BASIC METABOLIC PANEL
BUN: 16 mg/dL (ref 4–21)
Creatinine: 0.9 mg/dL (ref 0.5–1.1)
GLUCOSE: 108 mg/dL
Potassium: 4.7 mmol/L (ref 3.4–5.3)
SODIUM: 143 mmol/L (ref 137–147)

## 2015-07-11 LAB — TSH: TSH: 3.65 u[IU]/mL (ref 0.41–5.90)

## 2015-07-11 LAB — CBC AND DIFFERENTIAL
HCT: 39 % (ref 36–46)
HEMOGLOBIN: 11.6 g/dL — AB (ref 12.0–16.0)
PLATELETS: 397 10*3/uL (ref 150–399)
WBC: 11 10^3/mL

## 2015-07-11 LAB — HEMOGLOBIN A1C: Hemoglobin A1C: 5.6

## 2015-07-29 ENCOUNTER — Encounter: Payer: Self-pay | Admitting: Family

## 2015-07-29 ENCOUNTER — Non-Acute Institutional Stay (SKILLED_NURSING_FACILITY): Payer: Medicare Other | Admitting: Family

## 2015-07-29 DIAGNOSIS — N183 Chronic kidney disease, stage 3 unspecified: Secondary | ICD-10-CM

## 2015-07-29 DIAGNOSIS — I110 Hypertensive heart disease with heart failure: Secondary | ICD-10-CM | POA: Diagnosis not present

## 2015-07-29 DIAGNOSIS — I5032 Chronic diastolic (congestive) heart failure: Secondary | ICD-10-CM

## 2015-07-29 DIAGNOSIS — E785 Hyperlipidemia, unspecified: Secondary | ICD-10-CM

## 2015-07-29 DIAGNOSIS — R05 Cough: Secondary | ICD-10-CM

## 2015-07-29 DIAGNOSIS — E039 Hypothyroidism, unspecified: Secondary | ICD-10-CM | POA: Diagnosis not present

## 2015-07-29 DIAGNOSIS — R059 Cough, unspecified: Secondary | ICD-10-CM

## 2015-07-29 NOTE — Progress Notes (Signed)
Location:  [facility] Room Number: 503 A Place of Service:  SNF (31) Provider:  Richarda Blade, FNP-C  [name], MD  Patient Care Team: [name], MD as PCP - General (Internal Medicine)  Extended Emergency Contact Information Primary Emergency Contact: Nappi,Leroy Address: [address]          [place], [place] [address] [place] Home Phone: [phone] Mobile Phone: [phone] Relation: Spouse  Code Status:  Full Code Goals of care: Advanced Directive information Advanced Directives 07/29/2015  Does patient have an advance directive? No  Copy of advanced directive(s) in chart? -     Chief Complaint  Patient presents with  . Medical Management of Chronic Issues    Routine Visit    HPI:  Pt is a 82 y.o. female seen today at [facility]  for medical management of chronic diseases. She is seen in her room today. She complains of non-productive cough X 1 week. She has taken robitusuin without any relief.She denies any fever or chills. Facility staff report no new concerns.    Past Medical History  Diagnosis Date  . GERD (gastroesophageal reflux disease)   . Glaucoma   . Osteoarthritis   . Hyperlipidemia   . Anxiety   . Depression   . Bronchitis 02/22/2011  . Chronic pain   . Hypothyroidism   . DOE (dyspnea on exertion)     chronic   . CKD (chronic kidney disease) stage 4, GFR 15-29 ml/min (HCC)   . Physical deconditioning   . Falls   . PAF (paroxysmal atrial fibrillation) (HCC)     not felt to be a candidate for coumadin  . Hypertension   . CHF (congestive heart failure) (HCC)     felt to have diastolic dysfunction with normal EF at 60%  . Fibromyalgia     with chronic weakness   Past Surgical History  Procedure Laterality Date  . Hemorrhoid surgery    . Laparoscopic hysterectomy    . Knee surgery      bilateral  . Cesarean section    . Transthoracic echocardiogram  02/2011   EF 60% with diastolic dysfunction, high filling pressures  . Appendectomy      as a teenage  . Cholecystectomy      pt denies having cholecystectomy  . Femur im nail Right 03/23/2015    Procedure: INTRAMEDULLARY (IM) NAIL FEMORAL;  Surgeon: Durene Romans, MD;  Location: [facility];  Service: Orthopedics;  Laterality: Right;    Allergies  Allergen Reactions  . Penicillins Swelling and Rash      Medication List       This list is accurate as of: 07/29/15  2:54 PM.  Always use your most recent med list.               albuterol (2.5 MG/3ML) 0.083% nebulizer solution  Commonly known as:  PROVENTIL  Take 3 mLs (2.5 mg total) by nebulization every 4 (four) hours as needed for wheezing or shortness of breath.     amLODipine 5 MG tablet  Commonly known as:  NORVASC  Take 2.5 mg by mouth daily.     aspirin EC 325 MG tablet  Take 325 mg by mouth 2 (two) times daily.     atorvastatin 20 MG tablet  Commonly known as:  LIPITOR  Take 20 mg by mouth daily.     baclofen 10 MG tablet  Commonly known as:  LIORESAL  Take 1 (10mg )  Tablet at bedtime and every 8 hrs as needed     bisacodyl 5 MG EC tablet  Commonly known as:  DULCOLAX  Take 1 tablet (5 mg total) by mouth daily as needed for moderate constipation.     carvedilol 25 MG tablet  Commonly known as:  COREG  Take 25 mg by mouth 2 (two) times daily with a meal. Hold if sbp < 110     CERTAVITE SENIOR/ANTIOXIDANT Tabs  Take 1 tablet by mouth daily.     dorzolamide 2 % ophthalmic solution  Commonly known as:  TRUSOPT  Place 1 drop into both eyes 2 (two) times daily.     ferrous sulfate 325 (65 FE) MG tablet  Take 325 mg by mouth daily.     FLUoxetine 40 MG capsule  Commonly known as:  PROZAC  Take 40 mg by mouth every morning. Po in the am     fluticasone 50 MCG/ACT nasal spray  Commonly known as:  FLONASE  Place 2 sprays into both nostrils 2 (two) times daily as needed for allergies or rhinitis.     furosemide 40 MG tablet   Commonly known as:  LASIX  Take 40 mg by mouth daily. Hold if sbp < 110     guaiFENesin 600 MG 12 hr tablet  Commonly known as:  MUCINEX  Take 600 mg by mouth 2 (two) times daily.     HYDROcodone-acetaminophen 5-325 MG tablet  Commonly known as:  NORCO/VICODIN  Take 1 tablet by mouth every 6 (six) hours as needed for moderate pain.     levothyroxine 88 MCG tablet  Commonly known as:  SYNTHROID  Take 1 tablet (88 mcg total) by mouth daily before breakfast.     LORazepam 2 MG tablet  Commonly known as:  ATIVAN  Take 1  (2mg ) tablet every morning, then take 1 (2 mg) tablet by mouth at bedtime. Also take 1 (2mg ) tablet by mouth as needed in afternoon for severe anxiety.     magnesium oxide 400 MG tablet  Commonly known as:  MAG-OX  Take 800 mg by mouth daily.     MYRBETRIQ 25 MG Tb24 tablet  Generic drug:  mirabegron ER  Take 25 mg by mouth daily.     nitroGLYCERIN 0.4 MG SL tablet  Commonly known as:  NITROSTAT  Place 0.4 mg under the tongue every 5 (five) minutes as needed for chest pain. If pain is unrelived after 3 doses, call MD     OLANZapine 5 MG tablet  Commonly known as:  ZYPREXA  Take 5 mg by mouth daily.     oxybutynin 5 MG tablet  Commonly known as:  DITROPAN  Take 5 mg by mouth daily.     OXYGEN  Inhale into the lungs. Titrate 2-6 LPM to keep O2 stats > 93%     pantoprazole 40 MG tablet  Commonly known as:  PROTONIX  Take 40 mg by mouth daily before breakfast. 1 hour before breakfast     polyethylene glycol packet  Commonly known as:  MIRALAX / GLYCOLAX  Take 17 g by mouth daily.     potassium chloride SA 20 MEQ tablet  Commonly known as:  K-DUR,KLOR-CON  Take 20 mEq by mouth daily. Reported on 04/04/2015     prochlorperazine 5 MG tablet  Commonly known as:  COMPAZINE  Take 5 mg by mouth every 6 (six) hours as needed for nausea or vomiting.     senna-docusate  8.6-50 MG tablet  Commonly known as:  Senokot-S  Take 1 tablet by mouth at bedtime as  needed for mild constipation.     VITAMIN D-3 PO  Take 1,000 Units by mouth daily.        Review of Systems  Constitutional: Negative for fever, chills, activity change, appetite change and fatigue.  HENT: Negative.   Eyes: Negative.   Respiratory: Negative for cough, chest tightness, wheezing and stridor.   Cardiovascular: Negative.   Gastrointestinal: Negative.   Endocrine: Negative for cold intolerance, heat intolerance, polydipsia, polyphagia and polyuria.  Genitourinary: Negative for dysuria, urgency, frequency and flank pain.  Musculoskeletal: Positive for gait problem.       Uses wheelchair   Skin: Negative for color change, pallor and rash.  Neurological: Negative for dizziness, seizures, syncope and headaches.  Psychiatric/Behavioral: Negative for hallucinations, confusion, sleep disturbance and agitation.    Immunization History  Administered Date(s) Administered  . Influenza Split 11/20/2010  . Influenza,inj,Quad PF,36+ Mos 01/02/2013  . PPD Test 03/23/2015, 04/19/2015  . Pneumococcal Polysaccharide-23 02/23/2011   Pertinent  Health Maintenance Due  Topic Date Due  . INFLUENZA VACCINE  02/20/2016 (Originally 09/20/2015)  . DEXA SCAN  02/20/2016 (Originally 07/12/1994)  . PNA vac Low Risk Adult (2 of 2 - PCV13) 02/20/2016 (Originally 02/23/2012)   No flowsheet data found. Functional Status Survey:    Filed Vitals:   07/29/15 1419  BP: 135/81  Pulse: 70  Temp: 98.3 F (36.8 C)  Resp: 20  Height: 5\' 5"  (1.651 m)  Weight: 216 lb 12.8 oz (98.34 kg)  SpO2: 97%   Body mass index is 36.08 kg/(m^2). Physical Exam  Constitutional: She is oriented to person, place, and time. She appears well-nourished. No distress.  obese  HENT:  Head: Normocephalic.  Mouth/Throat: Oropharynx is clear and moist.  Eyes: Conjunctivae and EOM are normal. Pupils are equal, round, and reactive to light. Right eye exhibits no discharge. Left eye exhibits no discharge. No scleral  icterus.  Neck: Normal range of motion. No JVD present.  Cardiovascular: Normal rate, regular rhythm, normal heart sounds and intact distal pulses.  Exam reveals no gallop and no friction rub.   No murmur heard. Pulmonary/Chest: Effort normal.  Left upper lobes rales noted with bilateral expiration wheezes   Abdominal: Soft. Bowel sounds are normal. She exhibits no distension. There is no tenderness. There is no rebound and no guarding.  Musculoskeletal: She exhibits no edema or tenderness.  Unsteady gait   Lymphadenopathy:    She has no cervical adenopathy.  Neurological: She is oriented to person, place, and time.  Skin: Skin is warm and dry. No rash noted. No erythema. No pallor.  Psychiatric: She has a normal mood and affect.    Labs reviewed:  Recent Labs  03/30/15 0445 03/31/15 0402 04/01/15 0417 04/05/15 04/12/15 07/11/15  NA 140 140 140 143 140 143  K 4.3 4.2 4.1 4.4 4.5 4.7  CL 105 105 105  --   --   --   CO2 27 27 26   --   --   --   GLUCOSE 95 98 105*  --   --   --   BUN 36* 34* 30* 11 16 16   CREATININE 0.98 0.98 0.98 0.8 1.2* 0.9  CALCIUM 8.4* 8.5* 8.6*  --   --   --     Recent Labs  03/29/15 1226 03/30/15 0445 04/01/15 0417  AST 24 19 22   ALT 33 28 23  ALKPHOS 51  47 55  BILITOT 0.8 0.7 0.9  PROT 5.7* 5.1* 5.4*  ALBUMIN 2.9* 2.7* 2.8*    Recent Labs  03/28/15 0517 03/29/15 1226 03/30/15 0445 03/31/15 0402 04/01/15 0417 04/05/15 04/12/15 07/11/15  WBC 16.4* 16.9* 16.1* 15.5* 13.9* 11.2 7.9 11.0  NEUTROABS 11.1* 12.4* 11.0*  --   --   --   --   --   HGB 9.0* 8.9* 8.3* 8.6* 9.0* 8.6* 9.5* 11.6*  HCT 28.5* 28.2* 25.4* 27.5* 29.4* 30* 32* 39  MCV 97.6 97.9 95.5 98.9 99.7  --   --   --   PLT 399 429* 366 421* 403* 404* 361 397   Lab Results  Component Value Date   TSH 3.65 07/11/2015   Lab Results  Component Value Date   HGBA1C 5.6 07/11/2015   Lab Results  Component Value Date   CHOL 135 05/12/2015   HDL 31* 05/12/2015   LDLCALC 64  05/12/2015   TRIG 199* 05/12/2015    Significant Diagnostic Results in last 30 days:  No results found.  Assessment/Plan Cough  Afebrile. Reports cough x 1 week. Left upper lobe rales with bilateral wheezing noted. Start Albuterol 2.5/35ml inhale solution inhale 3 ml via Nebulizer every 6 HRS for shortness of breath or wheezing X 3 days. Monitor vital signs Q shift x 1 week then resume previous orders. Obtain Portable CXR Pa /Lat  rule out PNA    HTN B/p stable. Continue Amlodipine 2.5 mg Tablet, coreg 25 mg Tablet. Monitor BMP  CHF  No weight gain. Continue coreg 25 mg Tablet and Furosemide 40 mg tablet.   Hypothyroidism  Continue on Levothyroxine 88 mcg tablet. Monitor TSH level.   Hyperlipidemia  Continue on Atorvastatin 20 mg Tablet   CKD  Monitor BMP     Family/ staff Communication:Reviewed plan of care with patient and facility Nurse supervisor   Labs/tests ordered:  Portable CXR Pa Tomma Rakers

## 2015-08-04 ENCOUNTER — Non-Acute Institutional Stay (SKILLED_NURSING_FACILITY): Payer: Medicare Other | Admitting: Family

## 2015-08-04 ENCOUNTER — Encounter: Payer: Self-pay | Admitting: Family

## 2015-08-04 DIAGNOSIS — I110 Hypertensive heart disease with heart failure: Secondary | ICD-10-CM

## 2015-08-04 DIAGNOSIS — J189 Pneumonia, unspecified organism: Secondary | ICD-10-CM

## 2015-08-04 DIAGNOSIS — I5032 Chronic diastolic (congestive) heart failure: Secondary | ICD-10-CM

## 2015-08-04 NOTE — Progress Notes (Signed)
Patient ID: Debra Johnston, female   DOB: 05-May-1927, 82 y.o.   MRN: [id number]  [facility] Room Number: 503-A Place of Service:  SNF (31) Provider: Dinah Ngetich FNP-C   [name], MD  Patient Care Team: [name], MD as PCP - General (Internal Medicine)  Extended Emergency Contact Information Primary Emergency Contact: Aldama,Leroy Address: [address]          [place], [place] [address] [place] Home Phone: [phone] Mobile Phone: [phone] Relation: Spouse  Code Status:  DNR  Goals of care: Advanced Directive information Advanced Directives 08/04/2015  Does patient have an advance directive? Yes  Type of Advance Directive Out of facility DNR (pink MOST or yellow form)  Does patient want to make changes to advanced directive? No - Patient declined  Copy of advanced directive(s) in chart? Yes     Chief Complaint  Patient presents with  . Acute Visit    Acute concerns- Abnormal Chest Xray     HPI:  Pt is a 82 y.o. female seen today at [facility]  for an acute visit for abnormal chest X-ray results. She is seen in her room today. She recently had a cough and wheezing started on Albuterol every 6 hours with much improvement. Her recent CXR down 07/29/2015 showed mild left-sided peribronchial infiltrates. Temp 99.9    Past Medical History  Diagnosis Date  . GERD (gastroesophageal reflux disease)   . Glaucoma   . Osteoarthritis   . Hyperlipidemia   . Anxiety   . Depression   . Bronchitis 02/22/2011  . Chronic pain   . Hypothyroidism   . DOE (dyspnea on exertion)     chronic   . CKD (chronic kidney disease) stage 4, GFR 15-29 ml/min (HCC)   . Physical deconditioning   . Falls   . PAF (paroxysmal atrial fibrillation) (HCC)     not felt to be a candidate for coumadin  . Hypertension   . CHF (congestive heart failure) (HCC)     felt to have diastolic dysfunction with normal  EF at 60%  . Fibromyalgia     with chronic weakness   Past Surgical History  Procedure Laterality Date  . Hemorrhoid surgery    . Laparoscopic hysterectomy    . Knee surgery      bilateral  . Cesarean section    . Transthoracic echocardiogram  02/2011    EF 60% with diastolic dysfunction, high filling pressures  . Appendectomy      as a teenage  . Cholecystectomy      pt denies having cholecystectomy  . Femur im nail Right 03/23/2015    Procedure: INTRAMEDULLARY (IM) NAIL FEMORAL;  Surgeon: Durene Romans, MD;  Location: [facility];  Service: Orthopedics;  Laterality: Right;    Allergies  Allergen Reactions  . Penicillins Swelling and Rash      Medication List       This list is accurate as of: 08/04/15 12:12 PM.  Always use your most recent med list.               albuterol (2.5 MG/3ML) 0.083% nebulizer solution  Commonly known as:  PROVENTIL  Take 3 mLs (2.5 mg total) by nebulization every 4 (four) hours as needed for wheezing or shortness of breath.     amLODipine 2.5 MG tablet  Commonly known as:  NORVASC  Take 2.5 mg by mouth daily.  aspirin EC 325 MG tablet  Take 325 mg by mouth 2 (two) times daily.     atorvastatin 20 MG tablet  Commonly known as:  LIPITOR  Take 20 mg by mouth daily.     baclofen 10 MG tablet  Commonly known as:  LIORESAL  Take 1 (10mg )  Tablet at bedtime and every 8 hrs as needed     bisacodyl 5 MG EC tablet  Commonly known as:  DULCOLAX  Take 1 tablet (5 mg total) by mouth daily as needed for moderate constipation.     carvedilol 25 MG tablet  Commonly known as:  COREG  Take 25 mg by mouth 2 (two) times daily with a meal. Hold if sbp < 110     CERTAVITE SENIOR/ANTIOXIDANT Tabs  Take 1 tablet by mouth daily.     dorzolamide 2 % ophthalmic solution  Commonly known as:  TRUSOPT  Place 1 drop into both eyes 2 (two) times daily.     ferrous sulfate 325 (65 FE) MG tablet  Take 325 mg by mouth daily.     FLUoxetine 40 MG capsule    Commonly known as:  PROZAC  Take 40 mg by mouth every morning. Po in the am     fluticasone 50 MCG/ACT nasal spray  Commonly known as:  FLONASE  Place 2 sprays into both nostrils 2 (two) times daily as needed for allergies or rhinitis.     furosemide 40 MG tablet  Commonly known as:  LASIX  Take 40 mg by mouth daily. Hold if sbp < 110     guaiFENesin 600 MG 12 hr tablet  Commonly known as:  MUCINEX  Take 600 mg by mouth 2 (two) times daily.     HYDROcodone-acetaminophen 5-325 MG tablet  Commonly known as:  NORCO/VICODIN  Take 1 tablet by mouth every 6 (six) hours as needed for moderate pain.     levothyroxine 88 MCG tablet  Commonly known as:  SYNTHROID  Take 1 tablet (88 mcg total) by mouth daily before breakfast.     LORazepam 2 MG tablet  Commonly known as:  ATIVAN  Take 1  (2mg ) tablet every morning, then take 1 (2 mg) tablet by mouth at bedtime. Also take 1 (2mg ) tablet by mouth as needed in afternoon for severe anxiety.     magnesium oxide 400 MG tablet  Commonly known as:  MAG-OX  Take 800 mg by mouth daily.     nitroGLYCERIN 0.4 MG SL tablet  Commonly known as:  NITROSTAT  Place 0.4 mg under the tongue every 5 (five) minutes as needed for chest pain. If pain is unrelived after 3 doses, call MD     OLANZapine 5 MG tablet  Commonly known as:  ZYPREXA  Take 5 mg by mouth daily.     oxybutynin 5 MG tablet  Commonly known as:  DITROPAN  Take 5 mg by mouth daily.     OXYGEN  Inhale into the lungs. Titrate 2-6 LPM to keep O2 stats > 93%     pantoprazole 40 MG tablet  Commonly known as:  PROTONIX  Take 40 mg by mouth daily before breakfast. 1 hour before breakfast     polyethylene glycol packet  Commonly known as:  MIRALAX / GLYCOLAX  Take 17 g by mouth daily.     potassium chloride SA 20 MEQ tablet  Commonly known as:  K-DUR,KLOR-CON  Take 20 mEq by mouth daily. Reported on 04/04/2015     prochlorperazine  5 MG tablet  Commonly known as:  COMPAZINE  Take 5  mg by mouth every 6 (six) hours as needed for nausea or vomiting.     senna-docusate 8.6-50 MG tablet  Commonly known as:  Senokot-S  Take 1 tablet by mouth at bedtime as needed for mild constipation.     VITAMIN D-3 PO  Take 1,000 Units by mouth daily.        Review of Systems  Constitutional: Negative for fever, chills, activity change, appetite change and fatigue.  HENT: Negative.   Eyes: Negative.   Respiratory: Positive for cough. Negative for chest tightness, wheezing and stridor.        Oxygen via nasal cannula   Cardiovascular: Negative.   Gastrointestinal: Negative.   Endocrine: Negative for cold intolerance, heat intolerance, polydipsia, polyphagia and polyuria.  Musculoskeletal: Positive for gait problem.  Skin: Negative for color change, pallor and rash.  Psychiatric/Behavioral: Negative for hallucinations, confusion, sleep disturbance and agitation.    Immunization History  Administered Date(s) Administered  . Influenza Split 11/20/2010  . Influenza,inj,Quad PF,36+ Mos 01/02/2013  . PPD Test 03/23/2015, 04/19/2015  . Pneumococcal Polysaccharide-23 02/23/2011   Pertinent  Health Maintenance Due  Topic Date Due  . INFLUENZA VACCINE  02/20/2016 (Originally 09/20/2015)  . DEXA SCAN  02/20/2016 (Originally 07/12/1994)  . PNA vac Low Risk Adult (2 of 2 - PCV13) 02/20/2016 (Originally 02/23/2012)   No flowsheet data found. Functional Status Survey:    Filed Vitals:   08/04/15 0934  BP: 120/59  Pulse: 61  Temp: 99.4 F (37.4 C)  Resp: 18  Height: 5\' 5"  (1.651 m)  Weight: 216 lb (97.977 kg)  SpO2: 95%   Body mass index is 35.94 kg/(m^2). Physical Exam  Constitutional: She is oriented to person, place, and time. She appears well-nourished. No distress.  obese  HENT:  Head: Normocephalic.  Mouth/Throat: Oropharynx is clear and moist.  Eyes: Conjunctivae and EOM are normal. Pupils are equal, round, and reactive to light. Right eye exhibits no discharge. Left  eye exhibits no discharge. No scleral icterus.  Neck: Normal range of motion. No JVD present.  Cardiovascular: Normal rate, regular rhythm, normal heart sounds and intact distal pulses.  Exam reveals no gallop and no friction rub.   No murmur heard. Pulmonary/Chest: Effort normal. No respiratory distress. She has no wheezes. She has no rales.  Diminished to Ascultation. Oxygen 2 liters via [place] in place.   Abdominal: Soft. Bowel sounds are normal. She exhibits no distension. There is no tenderness. There is no rebound and no guarding.  Lymphadenopathy:    She has no cervical adenopathy.  Neurological: She is oriented to person, place, and time.  Skin: Skin is warm and dry. No rash noted. No erythema. No pallor.  Psychiatric: She has a normal mood and affect.    Labs reviewed:  Recent Labs  03/30/15 0445 03/31/15 0402 04/01/15 0417 04/05/15 04/12/15 07/11/15  NA 140 140 140 143 140 143  K 4.3 4.2 4.1 4.4 4.5 4.7  CL 105 105 105  --   --   --   CO2 27 27 26   --   --   --   GLUCOSE 95 98 105*  --   --   --   BUN 36* 34* 30* 11 16 16   CREATININE 0.98 0.98 0.98 0.8 1.2* 0.9  CALCIUM 8.4* 8.5* 8.6*  --   --   --     Recent Labs  03/29/15 1226 03/30/15 0445 04/01/15 0417  AST 24 19 22   ALT 33 28 23  ALKPHOS 51 47 55  BILITOT 0.8 0.7 0.9  PROT 5.7* 5.1* 5.4*  ALBUMIN 2.9* 2.7* 2.8*    Recent Labs  03/28/15 0517 03/29/15 1226 03/30/15 0445 03/31/15 0402 04/01/15 0417 04/05/15 04/12/15 07/11/15  WBC 16.4* 16.9* 16.1* 15.5* 13.9* 11.2 7.9 11.0  NEUTROABS 11.1* 12.4* 11.0*  --   --   --   --   --   HGB 9.0* 8.9* 8.3* 8.6* 9.0* 8.6* 9.5* 11.6*  HCT 28.5* 28.2* 25.4* 27.5* 29.4* 30* 32* 39  MCV 97.6 97.9 95.5 98.9 99.7  --   --   --   PLT 399 429* 366 421* 403* 404* 361 397   Lab Results  Component Value Date   TSH 3.65 07/11/2015   Lab Results  Component Value Date   HGBA1C 5.6 07/11/2015   Lab Results  Component Value Date   CHOL 135 05/12/2015   HDL 31*  05/12/2015   LDLCALC 64 05/12/2015   TRIG 199* 05/12/2015    Significant Diagnostic Results in last 30 days:  No results found.  Assessment/Plan 1. HCAP (healthcare-associated pneumonia) recent CXR down 07/29/2015 showed mild left-sided peribronchial infiltrates. Temp 99.9. Start levaquin 500 mg Tablet daily x 5 days and florastor 250 mg Tablet twice daily X 10 days.   2. Chronic diastolic CHF (congestive heart failure) (HCC) No weight gain. Exam findings negative for edema, wheezing, rales or crackles. Continue on Lasix and Coreg. Monitor daily weight.   3. Hypertensive heart disease with CHF (congestive heart failure) (HCC) B/p stable. Continue Amlodipine 2.5 mg tablet. Monitor BMP    Family/ staff Communication: Reviewed plan of care with patient and facility Nurse supervisor.  Labs/tests ordered:  None

## 2015-09-05 ENCOUNTER — Encounter: Payer: Self-pay | Admitting: Internal Medicine

## 2015-09-05 ENCOUNTER — Non-Acute Institutional Stay (SKILLED_NURSING_FACILITY): Payer: Medicare Other | Admitting: Internal Medicine

## 2015-09-05 DIAGNOSIS — D638 Anemia in other chronic diseases classified elsewhere: Secondary | ICD-10-CM | POA: Diagnosis not present

## 2015-09-05 DIAGNOSIS — I480 Paroxysmal atrial fibrillation: Secondary | ICD-10-CM | POA: Diagnosis not present

## 2015-09-05 DIAGNOSIS — N183 Chronic kidney disease, stage 3 unspecified: Secondary | ICD-10-CM

## 2015-09-05 DIAGNOSIS — I5032 Chronic diastolic (congestive) heart failure: Secondary | ICD-10-CM

## 2015-09-05 DIAGNOSIS — F329 Major depressive disorder, single episode, unspecified: Secondary | ICD-10-CM | POA: Diagnosis not present

## 2015-09-05 DIAGNOSIS — R339 Retention of urine, unspecified: Secondary | ICD-10-CM | POA: Diagnosis not present

## 2015-09-05 DIAGNOSIS — M19011 Primary osteoarthritis, right shoulder: Secondary | ICD-10-CM | POA: Diagnosis not present

## 2015-09-05 DIAGNOSIS — M19012 Primary osteoarthritis, left shoulder: Secondary | ICD-10-CM | POA: Diagnosis not present

## 2015-09-05 DIAGNOSIS — F411 Generalized anxiety disorder: Secondary | ICD-10-CM | POA: Diagnosis not present

## 2015-09-05 NOTE — Progress Notes (Signed)
Patient ID: Debra Johnston, female   DOB: 01-01-1928, 82 y.o.   MRN: [id number]    LOCATION: [name]  PCP: [name], MD   Code Status: DNR  Goals of care: Advanced Directive information Advanced Directives 09/05/2015  Does patient have an advance directive? Yes  Type of Advance Directive Out of facility DNR (pink MOST or yellow form)  Does patient want to make changes to advanced directive? No - Patient declined  Copy of advanced directive(s) in chart? Yes     Extended Emergency Contact Information Primary Emergency Contact: Debra,Johnston Address: [address]          [place], [place] [address] Debra Johnston Home Phone: [phone] Mobile Phone: [phone] Relation: Spouse   Allergies  Allergen Reactions  . Penicillins Swelling and Rash    Chief Complaint  Patient presents with  . Medical Management of Chronic Issues    Routine Visit     HPI:  Patient is a 82 y.o. female seen today for routine visit. She has been having emotional outbursts at times per nursing. She is seen in her room today. She complaints of feeling low and sad. She is trying to adjust to being in a new place. miralax helps with her bowel movement.    Review of Systems:  Constitutional: Negative for fever, chills. HENT: Negative for headache, congestion, nasal discharge. Positive for occasional cough Eyes: Negative for blurred vision, double vision and discharge.  Respiratory: Negative for wheezing. On o2 Cardiovascular: Negative for chest pain, palpitations.  Gastrointestinal: Negative for heartburn, vomiting, abdominal pain. Has occasional nausea.  Had bowel movement yesterday Genitourinary: has chronic foley catheter. Denies flank pain Musculoskeletal: Negative for back pain, fall in the facility Skin: Negative for itching, rash.  Neurological: Negative for dizziness Psychiatric/Behavioral: Negative for insomnia.    Past Medical History  Diagnosis Date  . GERD  (gastroesophageal reflux disease)   . Glaucoma   . Osteoarthritis   . Hyperlipidemia   . Anxiety   . Depression   . Bronchitis 02/22/2011  . Chronic pain   . Hypothyroidism   . DOE (dyspnea on exertion)     chronic   . CKD (chronic kidney disease) stage 4, GFR 15-29 ml/min (HCC)   . Physical deconditioning   . Falls   . PAF (paroxysmal atrial fibrillation) (HCC)     not felt to be a candidate for coumadin  . Hypertension   . CHF (congestive heart failure) (HCC)     felt to have diastolic dysfunction with normal EF at 60%  . Fibromyalgia     with chronic weakness   Past Surgical History  Procedure Laterality Date  . Hemorrhoid surgery    . Laparoscopic hysterectomy    . Knee surgery      bilateral  . Cesarean section    . Transthoracic echocardiogram  02/2011    EF 60% with diastolic dysfunction, high filling pressures  . Appendectomy      as a teenage  . Cholecystectomy      pt denies having cholecystectomy  . Femur im nail Right 03/23/2015    Procedure: INTRAMEDULLARY (IM) NAIL FEMORAL;  Surgeon: Durene Romans, MD;  Location: [facility];  Service: Orthopedics;  Laterality: Right;    Medications:   Medication List       This list is accurate as of: 09/05/15 11:46 AM.  Always use your most recent med list.               albuterol (  2.5 MG/3ML) 0.083% nebulizer solution  Commonly known as:  PROVENTIL  Take 3 mLs (2.5 mg total) by nebulization every 4 (four) hours as needed for wheezing or shortness of breath.     amLODipine 2.5 MG tablet  Commonly known as:  NORVASC  Take 2.5 mg by mouth daily.     aspirin EC 325 MG tablet  Take 325 mg by mouth 2 (two) times daily.     atorvastatin 20 MG tablet  Commonly known as:  LIPITOR  Take 20 mg by mouth daily.     baclofen 10 MG tablet  Commonly known as:  LIORESAL  Take 1 (10mg )  Tablet at bedtime and every 8 hrs as needed     bisacodyl 5 MG EC tablet  Commonly known as:  DULCOLAX  Take 1 tablet (5 mg total) by mouth  daily as needed for moderate constipation.     carvedilol 25 MG tablet  Commonly known as:  COREG  Take 25 mg by mouth 2 (two) times daily with a meal. Hold if sbp < 110     CERTAVITE SENIOR/ANTIOXIDANT Tabs  Take 1 tablet by mouth daily.     dorzolamide 2 % ophthalmic solution  Commonly known as:  TRUSOPT  Place 1 drop into both eyes 2 (two) times daily.     ferrous sulfate 325 (65 FE) MG tablet  Take 325 mg by mouth daily.     FLUoxetine 40 MG capsule  Commonly known as:  PROZAC  Take 40 mg by mouth every morning. Po in the am     fluticasone 50 MCG/ACT nasal spray  Commonly known as:  FLONASE  Place 2 sprays into both nostrils 2 (two) times daily as needed for allergies or rhinitis.     furosemide 40 MG tablet  Commonly known as:  LASIX  Take 40 mg by mouth daily. Hold if sbp < 110     guaiFENesin 600 MG 12 hr tablet  Commonly known as:  MUCINEX  Take 600 mg by mouth 2 (two) times daily.     HYDROcodone-acetaminophen 5-325 MG tablet  Commonly known as:  NORCO/VICODIN  Take 1 tablet by mouth every 6 (six) hours as needed for moderate pain.     levothyroxine 88 MCG tablet  Commonly known as:  SYNTHROID  Take 1 tablet (88 mcg total) by mouth daily before breakfast.     LORazepam 2 MG tablet  Commonly known as:  ATIVAN  Take 1  (2mg ) tablet every morning, then take 1 (2 mg) tablet by mouth at bedtime. Also take 1 (2mg ) tablet by mouth as needed in afternoon for severe anxiety.     magnesium oxide 400 MG tablet  Commonly known as:  MAG-OX  Take 800 mg by mouth daily.     nitroGLYCERIN 0.4 MG SL tablet  Commonly known as:  NITROSTAT  Place 0.4 mg under the tongue every 5 (five) minutes as needed for chest pain. If pain is unrelived after 3 doses, call MD     OLANZapine 5 MG tablet  Commonly known as:  ZYPREXA  Take 5 mg by mouth daily.     oxybutynin 5 MG tablet  Commonly known as:  DITROPAN  Take 5 mg by mouth daily.     OXYGEN  Inhale into the lungs.  Titrate 2-6 LPM to keep O2 stats > 93%     pantoprazole 40 MG tablet  Commonly known as:  PROTONIX  Take 40 mg by mouth daily before breakfast.  1 hour before breakfast     polyethylene glycol packet  Commonly known as:  MIRALAX / GLYCOLAX  Take 17 g by mouth daily.     potassium chloride SA 20 MEQ tablet  Commonly known as:  K-DUR,KLOR-CON  Take 20 mEq by mouth daily. Reported on 04/04/2015     prochlorperazine 5 MG tablet  Commonly known as:  COMPAZINE  Take 5 mg by mouth every 6 (six) hours as needed for nausea or vomiting.     senna 8.6 MG tablet  Commonly known as:  SENOKOT  Take 1 tablet by mouth at bedtime.     VITAMIN D-3 PO  Take 1,000 Units by mouth daily.        Immunizations: Immunization History  Administered Date(s) Administered  . Influenza Split 11/20/2010  . Influenza,inj,Quad PF,36+ Mos 01/02/2013  . PPD Test 03/23/2015, 04/19/2015  . Pneumococcal Polysaccharide-23 02/23/2011     Physical Exam: Filed Vitals:   09/05/15 1118  BP: 140/55  Pulse: 63  Temp: 97.2 F (36.2 C)  TempSrc: Oral  Resp: 20  Height:  (1.651 m)  Weight: 216 lb 12.8 oz (98.34 kg)  SpO2: 96%   Body mass index is 36.08 kg/(m^2).  General- elderly female, obese, in no acute distress Head- normocephalic, atraumatic Nose- no maxillary or frontal sinus tenderness, no nasal discharge Throat- moist mucus membrane Eyes- PERRLA, EOMI, no pallor, no icterus, no discharge Neck- no cervical lymphadenopathy Cardiovascular- normal s1,s2, no murmur, no leg edema Respiratory- CTAB, no wheeze, no rhonchi, no crackles, no use of accessory muscles, on o2 Abdomen- bowel sounds present, soft, non tender, foley in place Musculoskeletal- able to move all 4 extremities, generalized weakness, limited bilateral shoulder ROM Neurological- alert and oriented to person and place Skin- warm and dry Psychiatry- flat affect, tearful    Labs reviewed: Basic Metabolic Panel:  Recent Labs   03/30/15 0445 03/31/15 0402 04/01/15 0417 04/05/15 04/12/15 07/11/15  NA 140 140 140 143 140 143  K 4.3 4.2 4.1 4.4 4.5 4.7  CL 105 105 105  --   --   --   CO2 --   --   --   GLUCOSE 95 98 105*  --   --   --   BUN 36* 34* 30* CREATININE 0.98 0.98 0.98 0.8 1.2* 0.9  CALCIUM 8.4* 8.5* 8.6*  --   --   --    Liver Function Tests:  Recent Labs  03/29/15 1226 03/30/15 0445 04/01/15 0417  AST ALT 33 28 23  ALKPHOS 51 47 55  BILITOT 0.8 0.7 0.9  PROT 5.7* 5.1* 5.4*  ALBUMIN 2.9* 2.7* 2.8*   No results for input(s): LIPASE, AMYLASE in the last 8760 hours. No results for input(s): AMMONIA in the last 8760 hours. CBC:  Recent Labs  03/28/15 0517 03/29/15 1226 03/30/15 0445 03/31/15 0402 04/01/15 0417 04/05/15 04/12/15 07/11/15  WBC 16.4* 16.9* 16.1* 15.5* 13.9* 11.2 7.9 11.0  NEUTROABS 11.1* 12.4* 11.0*  --   --   --   --   --   HGB 9.0* 8.9* 8.3* 8.6* 9.0* 8.6* 9.5* 11.6*  HCT 28.5* 28.2* 25.4* 27.5* 29.4* 30* 32* 39  MCV 97.6 97.9 95.5 98.9 99.7  --   --   --   PLT 399 429* 366 421* 403* 404* 361 397     Assessment/Plan  Shoulder OA Currently on norco 5-325 mg q6h prn pain. Add tylenol  500 mg po bid with biofreeze bid and monitor. Change norco 5-325 mg to q8h prn with attempt for dose reduction.   Chronic Depression Currently on zyprexa 5 mg daily with prozac 40 mg daily. Change her prozac to 60 mg daily and have psych services to follow  Chronic diastolic chf Continue oxygen for now with her coreg 25 mg bid and lasix 40 mg daily. Check bmp  ckd stage 3 Monitor bmp  Anemia of chronic disease Continue ferrous sulfate 325 mg daily. Improved Hb on chart review from 5/17  Urinary retention Has foley in place with good urine output. Continue oxybutynin  GAD Continue ativan 2 mg bid with 2 mg qd prn. Monitor clinically  afib Currently on asprin EC 325 mg bid likely from being on dvt prophylaxis post ORIF. Change her aspirin to EC  325 mg daily. Continue coreg.     [name], MD Internal Medicine [facility] [address] [place], [place] [address] Cell Phone (Monday-Friday 8 am - 5 pm): [phone] On Call: [phone] and follow prompts after 5 pm and on weekends Office Phone: [phone] Office Fax: [phone]

## 2015-09-09 ENCOUNTER — Other Ambulatory Visit: Payer: Self-pay | Admitting: *Deleted

## 2015-09-09 MED ORDER — HYDROCODONE-ACETAMINOPHEN 5-325 MG PO TABS: ORAL_TABLET | ORAL | Status: DC

## 2015-09-09 NOTE — Telephone Encounter (Signed)
[facility] 

## 2015-10-04 ENCOUNTER — Non-Acute Institutional Stay (SKILLED_NURSING_FACILITY): Payer: Medicare Other | Admitting: Family

## 2015-10-04 ENCOUNTER — Encounter: Payer: Self-pay | Admitting: Family

## 2015-10-04 DIAGNOSIS — E039 Hypothyroidism, unspecified: Secondary | ICD-10-CM

## 2015-10-04 DIAGNOSIS — N183 Chronic kidney disease, stage 3 unspecified: Secondary | ICD-10-CM

## 2015-10-04 DIAGNOSIS — M25522 Pain in left elbow: Secondary | ICD-10-CM | POA: Diagnosis not present

## 2015-10-04 DIAGNOSIS — E46 Unspecified protein-calorie malnutrition: Secondary | ICD-10-CM

## 2015-10-04 DIAGNOSIS — F329 Major depressive disorder, single episode, unspecified: Secondary | ICD-10-CM | POA: Diagnosis not present

## 2015-10-04 DIAGNOSIS — I5032 Chronic diastolic (congestive) heart failure: Secondary | ICD-10-CM | POA: Diagnosis not present

## 2015-10-04 DIAGNOSIS — I110 Hypertensive heart disease with heart failure: Secondary | ICD-10-CM

## 2015-10-04 NOTE — Progress Notes (Signed)
Patient ID: Debra Johnston, female   DOB: 07-Mar-1927, 82 y.o.   MRN: [id number]  Location:   [facility]  Nursing Home Room Number: 503 A Place of Service:  SNF (31) Provider:  Marek Nghiem FNP-C   [name], MD  Patient Care Team: [name], MD as PCP - General (Internal Medicine)  Extended Emergency Contact Information Primary Emergency Contact: Amir,Leroy Address: [address]          [place], [place] [address] [place] Home Phone: [phone] Mobile Phone: [phone] Relation: Spouse  Code Status:  DNR  Goals of care: Advanced Directive information Advanced Directives 10/04/2015  Does patient have an advance directive? Yes  Type of Advance Directive Out of facility DNR (pink MOST or yellow form)  Does patient want to make changes to advanced directive? No - Patient declined  Copy of advanced directive(s) in chart? Yes  Would patient like information on creating an advanced directive? -  Pre-existing out of facility DNR order (yellow form or pink MOST form) -     Chief Complaint  Patient presents with  . Medical Management of Chronic Issues    Routine Visit    HPI:  Pt is a 82 y.o. female seen today at [facility]  for medical management of chronic diseases.  She has a medical history of HTN, CHF, Hypothyroidism, CKD stage 3, GAD, Depression among others. She is seen in her room today with Husband at bedside. She complains of worsening left forearm pain worse with movement states popping sound on the elbow. She has had left shoulder pain due to OA. She denies any fever, chills, numbness or tingling. Facility staff reports no new concerns.     Past Medical History:  Diagnosis Date  . Anxiety   . Bronchitis 02/22/2011  . CHF (congestive heart failure) (HCC)    felt to have diastolic dysfunction with normal EF at 60%  . Chronic pain   . CKD (chronic kidney disease) stage 4, GFR 15-29 ml/min (HCC)   .  Depression   . DOE (dyspnea on exertion)    chronic   . Falls   . Fibromyalgia    with chronic weakness  . GERD (gastroesophageal reflux disease)   . Glaucoma   . Hyperlipidemia   . Hypertension   . Hypothyroidism   . Osteoarthritis   . PAF (paroxysmal atrial fibrillation) (HCC)    not felt to be a candidate for coumadin  . Physical deconditioning    Past Surgical History:  Procedure Laterality Date  . APPENDECTOMY     as a teenage  . CESAREAN SECTION    . CHOLECYSTECTOMY     pt denies having cholecystectomy  . FEMUR IM NAIL Right 03/23/2015   Procedure: INTRAMEDULLARY (IM) NAIL FEMORAL;  Surgeon: Durene Romans, MD;  Location: [facility];  Service: Orthopedics;  Laterality: Right;  . HEMORRHOID SURGERY    . KNEE SURGERY     bilateral  . LAPAROSCOPIC HYSTERECTOMY    . TRANSTHORACIC ECHOCARDIOGRAM  02/2011   EF 60% with diastolic dysfunction, high filling pressures    Allergies  Allergen Reactions  . Penicillins Swelling and Rash      Medication List       Accurate as of 10/04/15  2:54 PM. Always use your most recent med list.          acetaminophen 500 MG tablet Commonly known as:  TYLENOL Take 500 mg by mouth 2 (two) times  daily.   albuterol (2.5 MG/3ML) 0.083% nebulizer solution Commonly known as:  PROVENTIL Take 3 mLs (2.5 mg total) by nebulization every 4 (four) hours as needed for wheezing or shortness of breath.   amLODipine 2.5 MG tablet Commonly known as:  NORVASC Take 2.5 mg by mouth daily.   aspirin 325 MG EC tablet Take 325 mg by mouth daily.   atorvastatin 20 MG tablet Commonly known as:  LIPITOR Take 20 mg by mouth daily.   baclofen 10 MG tablet Commonly known as:  LIORESAL Take 1 (10mg )  Tablet at bedtime and every 8 hrs as needed   bisacodyl 5 MG EC tablet Commonly known as:  DULCOLAX Take 1 tablet (5 mg total) by mouth daily as needed for moderate constipation.   carvedilol 25 MG tablet Commonly known as:  COREG Take 25 mg by mouth 2  (two) times daily with a meal. Hold if sbp < 110   CERTAVITE SENIOR/ANTIOXIDANT Tabs Take 1 tablet by mouth daily.   dorzolamide 2 % ophthalmic solution Commonly known as:  TRUSOPT Place 1 drop into both eyes 2 (two) times daily.   ferrous sulfate 325 (65 FE) MG tablet Take 325 mg by mouth daily.   FLUoxetine 20 MG tablet Commonly known as:  PROZAC Take 60 mg by mouth daily.   fluticasone 50 MCG/ACT nasal spray Commonly known as:  FLONASE Place 2 sprays into both nostrils 2 (two) times daily as needed for allergies or rhinitis.   furosemide 40 MG tablet Commonly known as:  LASIX Take 40 mg by mouth daily. Hold if sbp < 110   guaiFENesin 600 MG 12 hr tablet Commonly known as:  MUCINEX Take 600 mg by mouth 2 (two) times daily.   HYDROcodone-acetaminophen 5-325 MG tablet Commonly known as:  NORCO/VICODIN Take one tablet by mouth every 8 hours as needed for severe pain. Do not exceed 4gm of Tylenol in 24 hours   levothyroxine 88 MCG tablet Commonly known as:  SYNTHROID Take 1 tablet (88 mcg total) by mouth daily before breakfast.   LORazepam 2 MG tablet Commonly known as:  ATIVAN Take 1  (2mg ) tablet every morning, then take 1 (2 mg) tablet by mouth at bedtime. Also take 1 (2mg ) tablet by mouth as needed in afternoon for severe anxiety.   magnesium oxide 400 MG tablet Commonly known as:  MAG-OX Take 800 mg by mouth daily.   nitroGLYCERIN 0.4 MG SL tablet Commonly known as:  NITROSTAT Place 0.4 mg under the tongue every 5 (five) minutes as needed for chest pain. If pain is unrelived after 3 doses, call MD   OLANZapine 5 MG tablet Commonly known as:  ZYPREXA Take 5 mg by mouth daily.   oxybutynin 5 MG tablet Commonly known as:  DITROPAN Take 5 mg by mouth daily.   OXYGEN Inhale into the lungs. Titrate 2-6 LPM to keep O2 stats > 93%   pantoprazole 40 MG tablet Commonly known as:  PROTONIX Take 40 mg by mouth daily before breakfast. 1 hour before breakfast     polyethylene glycol packet Commonly known as:  MIRALAX / GLYCOLAX Take 17 g by mouth daily.   potassium chloride SA 20 MEQ tablet Commonly known as:  K-DUR,KLOR-CON Take 20 mEq by mouth daily. Reported on 04/04/2015   prochlorperazine 5 MG tablet Commonly known as:  COMPAZINE Take 5 mg by mouth every 6 (six) hours as needed for nausea or vomiting.   senna 8.6 MG tablet Commonly known as:  Toys 'R' UsSENOKOT  Take 1 tablet by mouth at bedtime.   VITAMIN D-3 PO Take 1,000 Units by mouth daily.       Review of Systems  Constitutional: Negative for activity change, appetite change, chills, fatigue and fever.  HENT: Negative.  Negative for rhinorrhea, sinus pressure, sneezing and sore throat.   Eyes: Negative.   Respiratory: Negative for cough, chest tightness, wheezing and stridor.        Oxygen via nasal cannula   Cardiovascular: Negative.   Gastrointestinal: Negative.   Endocrine: Negative for cold intolerance, heat intolerance, polydipsia, polyphagia and polyuria.  Genitourinary:       Foley Cath   Musculoskeletal: Positive for gait problem.       Left shoulder, elbow pain   Skin: Negative for color change, pallor and rash.  Neurological: Negative for dizziness, tremors, seizures, syncope and headaches.  Psychiatric/Behavioral: Negative for agitation, confusion, hallucinations and sleep disturbance.    Immunization History  Administered Date(s) Administered  . Influenza Split 11/20/2010  . Influenza,inj,Quad PF,36+ Mos 01/02/2013  . PPD Test 03/23/2015, 04/19/2015  . Pneumococcal Polysaccharide-23 02/23/2011   Pertinent  Health Maintenance Due  Topic Date Due  . INFLUENZA VACCINE  02/20/2016 (Originally 09/20/2015)  . DEXA SCAN  02/20/2016 (Originally 07/12/1994)  . PNA vac Low Risk Adult (2 of 2 - PCV13) 02/20/2016 (Originally 02/23/2012)   No flowsheet data found. Functional Status Survey:    Vitals:   10/04/15 1447  BP: (!) 158/91  Pulse: 90  Resp: 20  Temp: 97.5 F  (36.4 C)  TempSrc: Oral  SpO2: 98%  Weight: 213 lb 12.8 oz (97 kg)  Height: 5\' 5"  (1.651 m)   Body mass index is 35.58 kg/m. Physical Exam  Constitutional: She is oriented to person, place, and time. She appears well-nourished. No distress.  obese  HENT:  Head: Normocephalic.  Mouth/Throat: Oropharynx is clear and moist.  Eyes: Conjunctivae and EOM are normal. Pupils are equal, round, and reactive to light. Right eye exhibits no discharge. Left eye exhibits no discharge. No scleral icterus.  Neck: Normal range of motion. No JVD present.  Cardiovascular: Normal rate, regular rhythm, normal heart sounds and intact distal pulses.  Exam reveals no gallop and no friction rub.   No murmur heard. Pulmonary/Chest: Effort normal. No respiratory distress. She has no wheezes. She has no rales.  Diminished to Ascultation. Oxygen 2 liters via [place] in place.   Abdominal: Soft. Bowel sounds are normal. She exhibits no distension. There is no tenderness. There is no rebound and no guarding.  Musculoskeletal: She exhibits no edema, tenderness or deformity.  Limited ROM to left shoulder and elbow. Left elbow popping sound with ROM.   Lymphadenopathy:    She has no cervical adenopathy.  Neurological: She is oriented to person, place, and time.  Skin: Skin is warm and dry. No rash noted. No erythema. No pallor.  Psychiatric: She has a normal mood and affect.    Labs reviewed:  Recent Labs  03/30/15 0445 03/31/15 0402 04/01/15 0417 04/05/15 04/12/15 07/11/15  NA 140 140 140 143 140 143  K 4.3 4.2 4.1 4.4 4.5 4.7  CL 105 105 105  --   --   --   CO2 27 27 26   --   --   --   GLUCOSE 95 98 105*  --   --   --   BUN 36* 34* 30* 11 16 16   CREATININE 0.98 0.98 0.98 0.8 1.2* 0.9  CALCIUM 8.4* 8.5* 8.6*  --   --   --  Recent Labs  03/29/15 1226 03/30/15 0445 04/01/15 0417  AST 24 19 22   ALT 33 28 23  ALKPHOS 51 47 55  BILITOT 0.8 0.7 0.9  PROT 5.7* 5.1* 5.4*  ALBUMIN 2.9* 2.7* 2.8*     Recent Labs  03/28/15 0517 03/29/15 1226 03/30/15 0445 03/31/15 0402 04/01/15 0417 04/05/15 04/12/15 07/11/15  WBC 16.4* 16.9* 16.1* 15.5* 13.9* 11.2 7.9 11.0  NEUTROABS 11.1* 12.4* 11.0*  --   --   --   --   --   HGB 9.0* 8.9* 8.3* 8.6* 9.0* 8.6* 9.5* 11.6*  HCT 28.5* 28.2* 25.4* 27.5* 29.4* 30* 32* 39  MCV 97.6 97.9 95.5 98.9 99.7  --   --   --   PLT 399 429* 366 421* 403* 404* 361 397   Lab Results  Component Value Date   TSH 3.65 07/11/2015   Lab Results  Component Value Date   HGBA1C 5.6 07/11/2015   Lab Results  Component Value Date   CHOL 135 05/12/2015   HDL 31 (A) 05/12/2015   LDLCALC 64 05/12/2015   TRIG 199 (A) 05/12/2015   Assessment/Plan HTN B/p stable. Continue on Amlodipine and coreg. Monitor BMP  CHF  Stable. Continue on Coreg and Furosemide. wean off oxygen as tolerated.   Left Elbow Pain  Worsening left arm/forearm pain with popping at the elbow. Obtain portable X-ray left elbow 3 views rule out fracture. Continue Hydrocodone for pain. Will consider referral to ortho for evaluation.   Hypothyroidism  Continue on levothyroxine 88 mcg tablet   Depression Stable. Continue on Prozac 60 mg Tablet   CKD Continue to monitor BMP.   Protein Malnutrition Has had a 3 pound weight loss over five months though weight irregularity noted on facility Log 09/19/2015. Will continue to monitor. RD continue to follow up. Continue on supplement.   Family/ staff Communication: Reviewed plan of care with patient, Patient's husband and facility Nurse supervisor.  Labs/tests ordered:  Portable Left Elbow X-ray 3 views

## 2015-11-03 ENCOUNTER — Encounter: Payer: Self-pay | Admitting: Family

## 2015-11-03 ENCOUNTER — Non-Acute Institutional Stay (SKILLED_NURSING_FACILITY): Payer: Medicare Other | Admitting: Family

## 2015-11-03 DIAGNOSIS — I110 Hypertensive heart disease with heart failure: Secondary | ICD-10-CM

## 2015-11-03 DIAGNOSIS — K5901 Slow transit constipation: Secondary | ICD-10-CM

## 2015-11-03 DIAGNOSIS — E039 Hypothyroidism, unspecified: Secondary | ICD-10-CM | POA: Diagnosis not present

## 2015-11-03 DIAGNOSIS — I5032 Chronic diastolic (congestive) heart failure: Secondary | ICD-10-CM | POA: Diagnosis not present

## 2015-11-03 DIAGNOSIS — F411 Generalized anxiety disorder: Secondary | ICD-10-CM | POA: Diagnosis not present

## 2015-11-03 NOTE — Progress Notes (Signed)
Patient ID: Debra Johnston, female   DOB: 1928-01-31, 82 y.o.   MRN: [id number]  Location:   [facility] Room Number: 503 A Place of Service:  SNF (31) Provider:  Caesar Bookman, FNP-C   [name], MD  Patient Care Team: [name], MD as PCP - General (Internal Medicine)  Extended Emergency Contact Information Primary Emergency Contact: Hogg,Leroy Address: [address]          [place], [place] [address] [place] Home Phone: [phone] Mobile Phone: [phone] Relation: Spouse  Code Status:  DNR  Goals of care: Advanced Directive information Advanced Directives 11/03/2015  Does patient have an advance directive? Yes  Type of Advance Directive Out of facility DNR (pink MOST or yellow form)  Does patient want to make changes to advanced directive? No - Patient declined  Copy of advanced directive(s) in chart? Yes  Would patient like information on creating an advanced directive? -  Pre-existing out of facility DNR order (yellow form or pink MOST form) -     Chief Complaint  Patient presents with  . Medical Management of Chronic Issues    Routine Visit    HPI:  Pt is a 82 y.o. female seen today at [facility] for medical management of chronic diseases. She has a medical history of HTN, CHF, Afib, Fibromyalgia, Hypothyroidism, CKD stage 3 among other conditions. She is seen in her room today. She states having problems with constipations. Also request her ativan changed to twice daily and a PRN dose for afternoon due to increased anxiety. She has had no recent fall episodes, weight changes or hospital admission since prior visit. Facility staff reports no new concerns.    Past Medical History:  Diagnosis Date  . Anxiety   . Bronchitis 02/22/2011  . CHF (congestive heart failure) (HCC)    felt to have diastolic dysfunction with normal EF at 60%  . Chronic pain   . CKD (chronic kidney disease)  stage 4, GFR 15-29 ml/min (HCC)   . Depression   . DOE (dyspnea on exertion)    chronic   . Falls   . Fibromyalgia    with chronic weakness  . GERD (gastroesophageal reflux disease)   . Glaucoma   . Hyperlipidemia   . Hypertension   . Hypothyroidism   . Osteoarthritis   . PAF (paroxysmal atrial fibrillation) (HCC)    not felt to be a candidate for coumadin  . Physical deconditioning    Past Surgical History:  Procedure Laterality Date  . APPENDECTOMY     as a teenage  . CESAREAN SECTION    . CHOLECYSTECTOMY     pt denies having cholecystectomy  . FEMUR IM NAIL Right 03/23/2015   Procedure: INTRAMEDULLARY (IM) NAIL FEMORAL;  Surgeon: Durene Romans, MD;  Location: [facility];  Service: Orthopedics;  Laterality: Right;  . HEMORRHOID SURGERY    . KNEE SURGERY     bilateral  . LAPAROSCOPIC HYSTERECTOMY    . TRANSTHORACIC ECHOCARDIOGRAM  02/2011   EF 60% with diastolic dysfunction, high filling pressures    Allergies  Allergen Reactions  . Penicillins Swelling and Rash      Medication List       Accurate as of 11/03/15  2:21 PM. Always use your most recent med list.          acetaminophen 500 MG tablet Commonly known as:  TYLENOL Take 500 mg by mouth 2 (two)  times daily.   albuterol (2.5 MG/3ML) 0.083% nebulizer solution Commonly known as:  PROVENTIL Take 3 mLs (2.5 mg total) by nebulization every 4 (four) hours as needed for wheezing or shortness of breath.   amLODipine 2.5 MG tablet Commonly known as:  NORVASC Take 2.5 mg by mouth daily.   aspirin 325 MG EC tablet Take 325 mg by mouth daily.   atorvastatin 20 MG tablet Commonly known as:  LIPITOR Take 20 mg by mouth daily.   baclofen 10 MG tablet Commonly known as:  LIORESAL Take 1 (10mg )  Tablet at bedtime and every 8 hrs as needed   bisacodyl 5 MG EC tablet Commonly known as:  DULCOLAX Take 1 tablet (5 mg total) by mouth daily as needed for moderate constipation.   carvedilol 25 MG tablet Commonly  known as:  COREG Take 25 mg by mouth 2 (two) times daily with a meal. Hold if sbp < 110   CERTAVITE SENIOR/ANTIOXIDANT Tabs Take 1 tablet by mouth daily.   dorzolamide 2 % ophthalmic solution Commonly known as:  TRUSOPT Place 1 drop into both eyes 2 (two) times daily.   ferrous sulfate 325 (65 FE) MG tablet Take 325 mg by mouth daily.   FLUoxetine HCl 60 MG Tabs Take 60 mg by mouth daily.   fluticasone 50 MCG/ACT nasal spray Commonly known as:  FLONASE Place 2 sprays into both nostrils 2 (two) times daily as needed for allergies or rhinitis.   furosemide 40 MG tablet Commonly known as:  LASIX Take 40 mg by mouth daily. Hold if sbp < 110   guaiFENesin 600 MG 12 hr tablet Commonly known as:  MUCINEX Take 600 mg by mouth 2 (two) times daily.   HYDROcodone-acetaminophen 5-325 MG tablet Commonly known as:  NORCO/VICODIN Take one tablet by mouth every 8 hours as needed for severe pain. Do not exceed 4gm of Tylenol in 24 hours   levothyroxine 88 MCG tablet Commonly known as:  SYNTHROID Take 1 tablet (88 mcg total) by mouth daily before breakfast.   LORazepam 2 MG tablet Commonly known as:  ATIVAN Take 1  (2mg ) tablet every morning, then take 1 (2 mg) tablet by mouth at bedtime. Also take 1 (2mg ) tablet by mouth as needed in afternoon for severe anxiety.   magnesium oxide 400 MG tablet Commonly known as:  MAG-OX Take 800 mg by mouth daily.   nitroGLYCERIN 0.4 MG SL tablet Commonly known as:  NITROSTAT Place 0.4 mg under the tongue every 5 (five) minutes as needed for chest pain. If pain is unrelived after 3 doses, call MD   OLANZapine 5 MG tablet Commonly known as:  ZYPREXA Take 5 mg by mouth daily.   oxybutynin 5 MG tablet Commonly known as:  DITROPAN Take 5 mg by mouth 2 (two) times daily.   OXYGEN Inhale into the lungs. Titrate 2-6 LPM to keep O2 stats > 93%   pantoprazole 40 MG tablet Commonly known as:  PROTONIX Take 40 mg by mouth daily before breakfast. 1  hour before breakfast   polyethylene glycol packet Commonly known as:  MIRALAX / GLYCOLAX Take 17 g by mouth daily.   potassium chloride SA 20 MEQ tablet Commonly known as:  K-DUR,KLOR-CON Take 20 mEq by mouth daily. Reported on 04/04/2015   prochlorperazine 5 MG tablet Commonly known as:  COMPAZINE Take 5 mg by mouth every 6 (six) hours as needed for nausea or vomiting.   senna 8.6 MG tablet Commonly known as:  SENOKOT Take  1 tablet by mouth at bedtime.   VITAMIN D-3 PO Take 1,000 Units by mouth daily.       Review of Systems  Constitutional: Negative for activity change, appetite change, chills, fatigue and fever.  HENT: Negative for congestion, rhinorrhea, sinus pressure, sneezing and sore throat.   Eyes: Negative.   Respiratory: Negative for cough, chest tightness, wheezing and stridor.        Oxygen via nasal cannula   Cardiovascular: Negative for chest pain, palpitations and leg swelling.  Gastrointestinal: Positive for constipation. Negative for abdominal distention, abdominal pain, diarrhea, nausea and vomiting.  Endocrine: Negative for cold intolerance, heat intolerance, polydipsia, polyphagia and polyuria.  Genitourinary: Negative for dysuria, flank pain, frequency and urgency.  Musculoskeletal: Positive for gait problem.       Left shoulder, elbow pain   Skin: Negative for color change, pallor and rash.  Neurological: Negative for dizziness, tremors, seizures, syncope and headaches.  Hematological: Does not bruise/bleed easily.  Psychiatric/Behavioral: Negative for agitation, confusion, hallucinations and sleep disturbance. The patient is nervous/anxious.     Immunization History  Administered Date(s) Administered  . Influenza Split 11/20/2010  . Influenza,inj,Quad PF,36+ Mos 01/02/2013  . PPD Test 03/23/2015, 04/19/2015  . Pneumococcal Polysaccharide-23 02/23/2011   Pertinent  Health Maintenance Due  Topic Date Due  . INFLUENZA VACCINE  02/20/2016  (Originally 09/20/2015)  . DEXA SCAN  02/20/2016 (Originally 07/12/1994)  . PNA vac Low Risk Adult (2 of 2 - PCV13) 02/20/2016 (Originally 02/23/2012)   No flowsheet data found. Functional Status Survey:    Vitals:   11/03/15 1417  BP: (!) 127/53  Pulse: 63  Resp: 20  Temp: 97 F (36.1 C)  TempSrc: Oral  SpO2: 97%  Weight: 221 lb 9.6 oz (100.5 kg)   Body mass index is 36.88 kg/m. Physical Exam  Constitutional: She is oriented to person, place, and time. She appears well-nourished. No distress.  obese  HENT:  Head: Normocephalic.  Mouth/Throat: Oropharynx is clear and moist.  Eyes: Conjunctivae and EOM are normal. Pupils are equal, round, and reactive to light. Right eye exhibits no discharge. Left eye exhibits no discharge. No scleral icterus.  Neck: Normal range of motion. No JVD present.  Cardiovascular: Normal rate, regular rhythm, normal heart sounds and intact distal pulses.  Exam reveals no gallop and no friction rub.   No murmur heard. Pulmonary/Chest: Effort normal. No respiratory distress. She has no wheezes. She has no rales.   Oxygen 2 liters via [place] in place.   Abdominal: Soft. Bowel sounds are normal. She exhibits no distension. There is no tenderness. There is no rebound and no guarding.  Genitourinary:  Genitourinary Comments: Incontinent   Musculoskeletal: She exhibits no edema, tenderness or deformity.  Limited ROM to left shoulder and elbow. Left elbow popping sound with ROM.   Lymphadenopathy:    She has no cervical adenopathy.  Neurological: She is oriented to person, place, and time.  Skin: Skin is warm and dry. No rash noted. No erythema. No pallor.  Psychiatric: She has a normal mood and affect.    Labs reviewed:  Recent Labs  03/30/15 0445 03/31/15 0402 04/01/15 0417 04/05/15 04/12/15 07/11/15  NA 140 140 140 143 140 143  K 4.3 4.2 4.1 4.4 4.5 4.7  CL 105 105 105  --   --   --   CO2 27 27 26   --   --   --   GLUCOSE 95 98 105*  --   --   --  BUN 36* 34* 30* 11 16 16   CREATININE 0.98 0.98 0.98 0.8 1.2* 0.9  CALCIUM 8.4* 8.5* 8.6*  --   --   --     Recent Labs  03/29/15 1226 03/30/15 0445 04/01/15 0417  AST 24 19 22   ALT 33 28 23  ALKPHOS 51 47 55  BILITOT 0.8 0.7 0.9  PROT 5.7* 5.1* 5.4*  ALBUMIN 2.9* 2.7* 2.8*    Recent Labs  03/28/15 0517 03/29/15 1226 03/30/15 0445 03/31/15 0402 04/01/15 0417 04/05/15 04/12/15 07/11/15  WBC 16.4* 16.9* 16.1* 15.5* 13.9* 11.2 7.9 11.0  NEUTROABS 11.1* 12.4* 11.0*  --   --   --   --   --   HGB 9.0* 8.9* 8.3* 8.6* 9.0* 8.6* 9.5* 11.6*  HCT 28.5* 28.2* 25.4* 27.5* 29.4* 30* 32* 39  MCV 97.6 97.9 95.5 98.9 99.7  --   --   --   PLT 399 429* 366 421* 403* 404* 361 397   Lab Results  Component Value Date   TSH 3.65 07/11/2015   Lab Results  Component Value Date   HGBA1C 5.6 07/11/2015   Lab Results  Component Value Date   CHOL 135 05/12/2015   HDL 31 (A) 05/12/2015   LDLCALC 64 05/12/2015   TRIG 199 (A) 05/12/2015   Assessment/Plan HTN B/p stable. Continue on amlodipine,coreg and Lasix. Monitor BMP.   CHF Stable. Exam findings.continue on coreg and Lasix.monitor weight.   Constipation  No BM x 3 days. Bisacodyl 10 mg Tablet X 1 dose now. May repeat X 1 dose if no results.continue on miralax and senna.    Anxiety  change Lorazepam 1 mg Tablet to twice daily and 1 mg Tablet in the afternoon as needed.Follow up with Psychiatry service. Continue to monitor.   Hypothyroidism  Continue on levothyroxine  88 mcg Tablet. Monitor TSH level.   Family/ staff Communication: Reviewed plan of care with patient and facility Nurse supervisor.   Labs/tests ordered:  None

## 2015-11-11 ENCOUNTER — Other Ambulatory Visit: Payer: Self-pay | Admitting: *Deleted

## 2015-11-11 MED ORDER — HYDROCODONE-ACETAMINOPHEN 5-325 MG PO TABS: ORAL_TABLET | ORAL | 0 refills | Status: DC

## 2015-11-11 NOTE — Telephone Encounter (Signed)
[facility] [phone] Fax: [phone]  

## 2015-11-30 ENCOUNTER — Encounter: Payer: Self-pay | Admitting: Family

## 2015-11-30 ENCOUNTER — Non-Acute Institutional Stay (SKILLED_NURSING_FACILITY): Payer: Medicare Other | Admitting: Family

## 2015-11-30 DIAGNOSIS — F411 Generalized anxiety disorder: Secondary | ICD-10-CM | POA: Diagnosis not present

## 2015-11-30 DIAGNOSIS — F329 Major depressive disorder, single episode, unspecified: Secondary | ICD-10-CM | POA: Diagnosis not present

## 2015-11-30 DIAGNOSIS — E039 Hypothyroidism, unspecified: Secondary | ICD-10-CM | POA: Diagnosis not present

## 2015-11-30 DIAGNOSIS — M159 Polyosteoarthritis, unspecified: Secondary | ICD-10-CM

## 2015-11-30 DIAGNOSIS — M150 Primary generalized (osteo)arthritis: Secondary | ICD-10-CM | POA: Diagnosis not present

## 2015-11-30 NOTE — Progress Notes (Signed)
Patient ID: Debra Johnston, female   DOB: [birth year], 82 y.o.   MRN: [id number]  Location:  [facility] Room Number: 503 A Place of Service:  SNF (31) Provider: Glorianne Proctor FNP-C   [name], MD  Patient Care Team: [name], MD as PCP - General (Internal Medicine)  Extended Emergency Contact Information Primary Emergency Contact: Platte,Leroy Address: [address]          [place], [place] [address] [place] Home Phone: [phone] Mobile Phone: [phone] Relation: Spouse  Code Status: DNR  Goals of care: Advanced Directive information Advanced Directives 11/30/2015  Does patient have an advance directive? Yes  Type of Advance Directive Out of facility DNR (pink MOST or yellow form)  Does patient want to make changes to advanced directive? No - Patient declined  Copy of advanced directive(s) in chart? Yes  Would patient like information on creating an advanced directive? -  Pre-existing out of facility DNR order (yellow form or pink MOST form) -     Chief Complaint  Patient presents with  . Medical Management of Chronic Issues    Routine Visit    HPI:  Pt is a 82 y.o. female seen today for medical management of chronic diseases.She has a medical histroy of HTN, Hyperlipidemia,CHF,CKD stage 3,Hypothyroidism, fibromyalgia, Anxiety, depression among other conditions. She is seen in her room today. She complains of worsening bilateral forearm pain. She states arms jerk and pops with movement.she denies any fever, chills or swelling on arms.     Past Medical History:  Diagnosis Date  . Anxiety   . Bronchitis 02/22/2011  . CHF (congestive heart failure) (HCC)    felt to have diastolic dysfunction with normal EF at 60%  . Chronic pain   . CKD (chronic kidney disease) stage 4, GFR 15-29 ml/min (HCC)   . Depression   . DOE (dyspnea on exertion)    chronic   . Falls   . Fibromyalgia    with chronic weakness  .  GERD (gastroesophageal reflux disease)   . Glaucoma   . Hyperlipidemia   . Hypertension   . Hypothyroidism   . Osteoarthritis   . PAF (paroxysmal atrial fibrillation) (HCC)    not felt to be a candidate for coumadin  . Physical deconditioning    Past Surgical History:  Procedure Laterality Date  . APPENDECTOMY     as a teenage  . CESAREAN SECTION    . CHOLECYSTECTOMY     pt denies having cholecystectomy  . FEMUR IM NAIL Right 03/23/2015   Procedure: INTRAMEDULLARY (IM) NAIL FEMORAL;  Surgeon: Durene Romans, MD;  Location: [facility];  Service: Orthopedics;  Laterality: Right;  . HEMORRHOID SURGERY    . KNEE SURGERY     bilateral  . LAPAROSCOPIC HYSTERECTOMY    . TRANSTHORACIC ECHOCARDIOGRAM  02/2011   EF 60% with diastolic dysfunction, high filling pressures    Allergies  Allergen Reactions  . Penicillins Swelling and Rash      Medication List       Accurate as of 11/30/15 10:29 AM. Always use your most recent med list.          acetaminophen 500 MG tablet Commonly known as:  TYLENOL Take 500 mg by mouth 2 (two) times daily.   albuterol (2.5 MG/3ML) 0.083% nebulizer solution Commonly known as:  PROVENTIL Take 3 mLs (2.5 mg total) by nebulization every 4 (four) hours as needed for wheezing or shortness of breath.  amLODipine 2.5 MG tablet Commonly known as:  NORVASC Take 2.5 mg by mouth daily.   aspirin 325 MG EC tablet Take 325 mg by mouth daily.   atorvastatin 20 MG tablet Commonly known as:  LIPITOR Take 20 mg by mouth daily.   baclofen 10 MG tablet Commonly known as:  LIORESAL Take 1 (10mg )  Tablet at bedtime and every 8 hrs as needed   bisacodyl 5 MG EC tablet Commonly known as:  DULCOLAX Take 1 tablet (5 mg total) by mouth daily as needed for moderate constipation.   carvedilol 25 MG tablet Commonly known as:  COREG Take 25 mg by mouth 2 (two) times daily with a meal. Hold if sbp < 110   CERTAVITE SENIOR/ANTIOXIDANT Tabs Take 1 tablet by mouth  daily.   dorzolamide 2 % ophthalmic solution Commonly known as:  TRUSOPT Place 1 drop into both eyes 2 (two) times daily.   ferrous sulfate 325 (65 FE) MG tablet Take 325 mg by mouth daily.   FLUoxetine HCl 60 MG Tabs Take 60 mg by mouth daily.   fluticasone 50 MCG/ACT nasal spray Commonly known as:  FLONASE Place 2 sprays into both nostrils 2 (two) times daily as needed for allergies or rhinitis.   furosemide 40 MG tablet Commonly known as:  LASIX Take 40 mg by mouth daily. Hold if sbp < 110   guaiFENesin 600 MG 12 hr tablet Commonly known as:  MUCINEX Take 600 mg by mouth 2 (two) times daily.   HYDROcodone-acetaminophen 5-325 MG tablet Commonly known as:  NORCO/VICODIN Take one tablet by mouth every 8 hours as needed for severe pain. Do not exceed 4gm of Tylenol in 24 hours   levothyroxine 88 MCG tablet Commonly known as:  SYNTHROID Take 1 tablet (88 mcg total) by mouth daily before breakfast.   LORazepam 2 MG tablet Commonly known as:  ATIVAN Take 1  (2mg ) tablet every morning, then take 1 (2 mg) tablet by mouth at bedtime. Also take 1 (2mg ) tablet by mouth as needed in afternoon for severe anxiety.   magnesium oxide 400 MG tablet Commonly known as:  MAG-OX Take 800 mg by mouth daily.   nitroGLYCERIN 0.4 MG SL tablet Commonly known as:  NITROSTAT Place 0.4 mg under the tongue every 5 (five) minutes as needed for chest pain. If pain is unrelived after 3 doses, call MD   OLANZapine 5 MG tablet Commonly known as:  ZYPREXA Take 5 mg by mouth daily.   oxybutynin 5 MG tablet Commonly known as:  DITROPAN Take 5 mg by mouth 2 (two) times daily.   OXYGEN Inhale into the lungs. Titrate 2-6 LPM to keep O2 stats > 93%   pantoprazole 40 MG tablet Commonly known as:  PROTONIX Take 40 mg by mouth daily before breakfast. 1 hour before breakfast   polyethylene glycol packet Commonly known as:  MIRALAX / GLYCOLAX Take 17 g by mouth daily.   potassium chloride SA 20 MEQ  tablet Commonly known as:  K-DUR,KLOR-CON Take 20 mEq by mouth daily. Reported on 04/04/2015   prochlorperazine 5 MG tablet Commonly known as:  COMPAZINE Take 5 mg by mouth every 6 (six) hours as needed for nausea or vomiting.   senna 8.6 MG tablet Commonly known as:  SENOKOT Take 1 tablet by mouth at bedtime.   VITAMIN D-3 PO Take 1,000 Units by mouth daily.       Review of Systems  Constitutional: Negative for activity change, appetite change, chills, fatigue and  fever.  HENT: Negative for congestion, rhinorrhea, sinus pressure, sneezing and sore throat.   Eyes: Negative.   Respiratory: Negative for cough, chest tightness, wheezing and stridor.        Oxygen via nasal cannula   Cardiovascular: Negative for chest pain, palpitations and leg swelling.  Gastrointestinal: Negative for abdominal distention, abdominal pain, constipation, diarrhea, nausea and vomiting.  Endocrine: Negative for cold intolerance, heat intolerance, polydipsia, polyphagia and polyuria.  Genitourinary: Negative for dysuria, flank pain, frequency and urgency.  Musculoskeletal: Positive for gait problem.       Left shoulder,forearms pain   Skin: Negative for color change, pallor and rash.  Neurological: Negative for dizziness, tremors, seizures, syncope and headaches.  Hematological: Does not bruise/bleed easily.  Psychiatric/Behavioral: Negative for agitation, confusion, hallucinations and sleep disturbance. The patient is nervous/anxious.     Immunization History  Administered Date(s) Administered  . Influenza Split 11/20/2010  . Influenza,inj,Quad PF,36+ Mos 01/02/2013  . PPD Test 03/23/2015, 04/19/2015  . Pneumococcal Polysaccharide-23 02/23/2011   Pertinent  Health Maintenance Due  Topic Date Due  . INFLUENZA VACCINE  02/20/2016 (Originally 09/20/2015)  . DEXA SCAN  02/20/2016 (Originally 07/12/1994)  . PNA vac Low Risk Adult (2 of 2 - PCV13) 02/20/2016 (Originally 02/23/2012)   Vitals:   11/30/15  1024  BP: (!) 137/57  Pulse: 67  Resp: 16  Temp: 98.5 F (36.9 C)  TempSrc: Oral  SpO2: 98%  Weight: 211 lb 3.2 oz (95.8 kg)  Height: 5\' 5"  (1.651 m)   Body mass index is 35.15 kg/m. Physical Exam  Constitutional: She is oriented to person, place, and time. She appears well-nourished. No distress.  obese  HENT:  Head: Normocephalic.  Mouth/Throat: Oropharynx is clear and moist.  Eyes: Conjunctivae and EOM are normal. Pupils are equal, round, and reactive to light. Right eye exhibits no discharge. Left eye exhibits no discharge. No scleral icterus.  Neck: Normal range of motion. No JVD present.  Cardiovascular: Normal rate, regular rhythm, normal heart sounds and intact distal pulses.  Exam reveals no gallop and no friction rub.   No murmur heard. Pulmonary/Chest: Effort normal and breath sounds normal. No respiratory distress. She has no wheezes. She has no rales.   Oxygen 2 liters via [place] in place.   Abdominal: Soft. Bowel sounds are normal. She exhibits no distension. There is no tenderness. There is no rebound and no guarding.  Genitourinary:  Genitourinary Comments: Incontinent   Musculoskeletal: She exhibits no edema, tenderness or deformity.  Limited ROM to left shoulder and forearm.cracking sound with Bilateral arms ROM.    Lymphadenopathy:    She has no cervical adenopathy.  Neurological: She is oriented to person, place, and time.  Skin: Skin is warm and dry. No rash noted. No erythema. No pallor.  Skin intact  Psychiatric: She has a normal mood and affect.    Labs reviewed:  Recent Labs  03/30/15 0445 03/31/15 0402 04/01/15 0417 04/05/15 04/12/15 07/11/15  NA 140 140 140 143 140 143  K 4.3 4.2 4.1 4.4 4.5 4.7  CL 105 105 105  --   --   --   CO2 27 27 26   --   --   --   GLUCOSE 95 98 105*  --   --   --   BUN 36* 34* 30* 11 16 16   CREATININE 0.98 0.98 0.98 0.8 1.2* 0.9  CALCIUM 8.4* 8.5* 8.6*  --   --   --     Recent Labs  03/29/15 1226 03/30/15 0445  04/01/15 0417  AST 24 19 22   ALT 33 28 23  ALKPHOS 51 47 55  BILITOT 0.8 0.7 0.9  PROT 5.7* 5.1* 5.4*  ALBUMIN 2.9* 2.7* 2.8*    Recent Labs  03/28/15 0517 03/29/15 1226 03/30/15 0445 03/31/15 0402 04/01/15 0417 04/05/15 04/12/15 07/11/15  WBC 16.4* 16.9* 16.1* 15.5* 13.9* 11.2 7.9 11.0  NEUTROABS 11.1* 12.4* 11.0*  --   --   --   --   --   HGB 9.0* 8.9* 8.3* 8.6* 9.0* 8.6* 9.5* 11.6*  HCT 28.5* 28.2* 25.4* 27.5* 29.4* 30* 32* 39  MCV 97.6 97.9 95.5 98.9 99.7  --   --   --   PLT 399 429* 366 421* 403* 404* 361 397   Lab Results  Component Value Date   TSH 3.65 07/11/2015   Lab Results  Component Value Date   HGBA1C 5.6 07/11/2015   Lab Results  Component Value Date   CHOL 135 05/12/2015   HDL 31 (A) 05/12/2015   LDLCALC 64 05/12/2015   TRIG 199 (A) 05/12/2015   Assessment/Plan Osteoarthritis  Worsening pain and limited ROM to both arms though worse on left arm. Refer to Ortho for evaluation.   Hypothyroidism Continue on levothyroxine. Monitor TSH level  Generalized anxiety  Stable. Continue on lorazepam.   Depression  Stable. Continue on fluoxetine and olanzapine. Continue to follow up with Psychiatry service. Monitor for mood changes.    Family/ staff Communication: Reviewed plan of care with patient and facility nurse supervisor.   Labs/tests ordered: None

## 2016-01-09 ENCOUNTER — Encounter: Payer: Self-pay | Admitting: Internal Medicine

## 2016-01-09 ENCOUNTER — Other Ambulatory Visit: Payer: Self-pay

## 2016-01-09 ENCOUNTER — Non-Acute Institutional Stay (SKILLED_NURSING_FACILITY): Payer: Medicare Other | Admitting: Internal Medicine

## 2016-01-09 DIAGNOSIS — M19012 Primary osteoarthritis, left shoulder: Secondary | ICD-10-CM

## 2016-01-09 DIAGNOSIS — N183 Chronic kidney disease, stage 3 unspecified: Secondary | ICD-10-CM

## 2016-01-09 DIAGNOSIS — E785 Hyperlipidemia, unspecified: Secondary | ICD-10-CM

## 2016-01-09 DIAGNOSIS — M19011 Primary osteoarthritis, right shoulder: Secondary | ICD-10-CM | POA: Diagnosis not present

## 2016-01-09 DIAGNOSIS — E039 Hypothyroidism, unspecified: Secondary | ICD-10-CM

## 2016-01-09 DIAGNOSIS — I110 Hypertensive heart disease with heart failure: Secondary | ICD-10-CM | POA: Diagnosis not present

## 2016-01-09 DIAGNOSIS — K5909 Other constipation: Secondary | ICD-10-CM | POA: Diagnosis not present

## 2016-01-09 DIAGNOSIS — L304 Erythema intertrigo: Secondary | ICD-10-CM

## 2016-01-09 MED ORDER — HYDROCODONE-ACETAMINOPHEN 5-325 MG PO TABS: ORAL_TABLET | ORAL | 0 refills | Status: DC

## 2016-01-09 NOTE — Telephone Encounter (Signed)
[facility]:  [facility] [address] [place] [address]  Phone: [phone]  Fax: [phone];p

## 2016-01-09 NOTE — Progress Notes (Signed)
Patient ID: Debra Johnston, female   DOB: 30-Mar-1927, 82 y.o.   MRN: [id number]    LOCATION: [name]  PCP: [name], MD   Code Status: DNR  Goals of care: Advanced Directive information Advanced Directives 11/30/2015  Does patient have an advance directive? Yes  Type of Advance Directive Out of facility DNR (pink MOST or yellow form)  Does patient want to make changes to advanced directive? No - Patient declined  Copy of advanced directive(s) in chart? Yes  Would patient like information on creating an advanced directive? -  Pre-existing out of facility DNR order (yellow form or pink MOST form) -     Extended Emergency Contact Information Primary Emergency Contact: Marines,Leroy Address: [address]          [place], [place] [address] [place] Home Phone: [phone] Mobile Phone: [phone] Relation: Spouse   Allergies  Allergen Reactions  . Penicillins Swelling and Rash    Chief Complaint  Patient presents with  . Medical Management of Chronic Issues    Routine Visit     HPI:  Patient is a 82 y.o. female seen today for routine visit. She has been at her baseline. She is being followed by orthopedics for left shoulder pain and is undergoing therapy at present. She continues to be on chronic oxygen. She complains of straining during bowel movement. She is being followed by registered dietitian and is on med Pass supplement. She is also getting nystatin cream for right inner thigh ITD. No other concerns.   Review of Systems:  Constitutional: Negative for fever, chills. HENT: Negative for headache, congestion, nasal discharge and cough. Eyes: Negative for double vision and discharge.  Respiratory: Negative for wheezing and dyspnea. On o2 Cardiovascular: Negative for chest pain, palpitation.  Gastrointestinal: Negative for heartburn, nausea, vomiting, abdominal pain. Denies blood in stool Genitourinary: has chronic foley catheter. Denies  flank pain Musculoskeletal: Negative for back pain, fall in the facility Skin: Negative for itching, rash.  Neurological: Negative for dizziness    Past Medical History:  Diagnosis Date  . Anxiety   . Bronchitis 02/22/2011  . CHF (congestive heart failure) (HCC)    felt to have diastolic dysfunction with normal EF at 60%  . Chronic pain   . CKD (chronic kidney disease) stage 4, GFR 15-29 ml/min (HCC)   . Depression   . DOE (dyspnea on exertion)    chronic   . Falls   . Fibromyalgia    with chronic weakness  . GERD (gastroesophageal reflux disease)   . Glaucoma   . Hyperlipidemia   . Hypertension   . Hypothyroidism   . Osteoarthritis   . PAF (paroxysmal atrial fibrillation) (HCC)    not felt to be a candidate for coumadin  . Physical deconditioning    Past Surgical History:  Procedure Laterality Date  . APPENDECTOMY     as a teenage  . CESAREAN SECTION    . CHOLECYSTECTOMY     pt denies having cholecystectomy  . FEMUR IM NAIL Right 03/23/2015   Procedure: INTRAMEDULLARY (IM) NAIL FEMORAL;  Surgeon: Durene Romans, MD;  Location: [facility];  Service: Orthopedics;  Laterality: Right;  . HEMORRHOID SURGERY    . KNEE SURGERY     bilateral  . LAPAROSCOPIC HYSTERECTOMY    . TRANSTHORACIC ECHOCARDIOGRAM  02/2011   EF 60% with diastolic dysfunction, high filling pressures    Medications:   Medication List       Accurate as of 01/09/16  2:54 PM. Always use your most recent med list.          acetaminophen 500 MG tablet Commonly known as:  TYLENOL Take 500 mg by mouth 2 (two) times daily.   albuterol (2.5 MG/3ML) 0.083% nebulizer solution Commonly known as:  PROVENTIL Take 3 mLs (2.5 mg total) by nebulization every 4 (four) hours as needed for wheezing or shortness of breath.   amLODipine 2.5 MG tablet Commonly known as:  NORVASC Take 2.5 mg by mouth daily.   aspirin 325 MG EC tablet Take 325 mg by mouth daily.   atorvastatin 20 MG tablet Commonly known as:   LIPITOR Take 20 mg by mouth daily.   baclofen 10 MG tablet Commonly known as:  LIORESAL Take 10 mg by mouth at bedtime. Tablet at bedtime and every 8 hrs as needed   bisacodyl 5 MG EC tablet Commonly known as:  DULCOLAX Take 1 tablet (5 mg total) by mouth daily as needed for moderate constipation.   carvedilol 25 MG tablet Commonly known as:  COREG Take 25 mg by mouth 2 (two) times daily with a meal. Hold if sbp < 110   CERTAVITE SENIOR/ANTIOXIDANT Tabs Take 1 tablet by mouth daily.   dorzolamide 2 % ophthalmic solution Commonly known as:  TRUSOPT Place 1 drop into both eyes 2 (two) times daily.   ferrous sulfate 325 (65 FE) MG tablet Take 325 mg by mouth daily.   FLUoxetine HCl 60 MG Tabs Take 60 mg by mouth daily.   furosemide 40 MG tablet Commonly known as:  LASIX Take 40 mg by mouth daily. Hold if sbp < 110   guaiFENesin 600 MG 12 hr tablet Commonly known as:  MUCINEX Take 600 mg by mouth 2 (two) times daily.   HYDROcodone-acetaminophen 5-325 MG tablet Commonly known as:  NORCO/VICODIN Take one tablet by mouth every 8 hours as needed for severe pain. Do not exceed 4gm of Tylenol in 24 hours   levothyroxine 88 MCG tablet Commonly known as:  SYNTHROID Take 1 tablet (88 mcg total) by mouth daily before breakfast.   LORazepam 2 MG tablet Commonly known as:  ATIVAN Take 1  (2mg ) tablet every morning, then take 1 (2 mg) tablet by mouth at bedtime. Also take 1 (2mg ) tablet by mouth as needed in afternoon for severe anxiety.   magnesium oxide 400 MG tablet Commonly known as:  MAG-OX Take 1,600 mg by mouth daily.   nitroGLYCERIN 0.4 MG SL tablet Commonly known as:  NITROSTAT Place 0.4 mg under the tongue every 5 (five) minutes as needed for chest pain. If pain is unrelived after 3 doses, call MD   nystatin cream Commonly known as:  MYCOSTATIN Apply 1 application topically 2 (two) times daily.   OLANZapine 5 MG tablet Commonly known as:  ZYPREXA Take 5 mg by  mouth daily.   oxybutynin 5 MG tablet Commonly known as:  DITROPAN Take 5 mg by mouth 2 (two) times daily.   OXYGEN Inhale into the lungs. Titrate 2-6 LPM to keep O2 stats > 93%   pantoprazole 40 MG tablet Commonly known as:  PROTONIX Take 40 mg by mouth daily before breakfast. 1 hour before breakfast   polyethylene glycol packet Commonly known as:  MIRALAX / GLYCOLAX Take 17 g by mouth daily.   potassium chloride SA 20 MEQ tablet Commonly known as:  K-DUR,KLOR-CON Take 20 mEq by mouth daily. Reported on 04/04/2015   prochlorperazine 5 MG tablet Commonly known as:  COMPAZINE Take  5 mg by mouth every 6 (six) hours as needed for nausea or vomiting.   senna 8.6 MG tablet Commonly known as:  SENOKOT Take 1 tablet by mouth at bedtime.   UNABLE TO FIND Med Name: Med pass 60 mL by mouth 2 times daily   VITAMIN D-3 PO Take 1,000 Units by mouth daily.       Immunizations: Immunization History  Administered Date(s) Administered  . Influenza Split 11/20/2010  . Influenza,inj,Quad PF,36+ Mos 01/02/2013  . Influenza-Unspecified 11/29/2015  . PPD Test 03/23/2015, 04/19/2015  . Pneumococcal Polysaccharide-23 02/23/2011     Physical Exam: Vitals:   01/09/16 1444  BP: 130/70  Pulse: 68  Resp: 16  Temp: (!) 96.9 F (36.1 C)  TempSrc: Oral  SpO2: 95%  Weight: 201 lb 9.6 oz (91.4 kg)  Height: 5\' 10"  (1.778 m)   Body mass index is 28.93 kg/m.  General- elderly female, Overweight, in no acute distress Head- normocephalic, atraumatic Nose- no nasal discharge Throat- moist mucus membrane Eyes- PERRLA, EOMI, no pallor, no icterus, no discharge Neck- no cervical lymphadenopathy Cardiovascular- normal s1,s2, no murmur, no leg edema Respiratory- CTAB, no wheeze, no rhonchi, no crackles, no use of accessory muscles, on o2 Abdomen- bowel sounds present, soft, non tender, foley in place Musculoskeletal- able to move all 4 extremities, generalized weakness, limited bilateral  shoulder ROM Neurological- alert and oriented to person and place Skin- warm and dry, right inner thigh redness with moisture noted Psychiatry- normal mood and affect this visit    Labs reviewed: Basic Metabolic Panel:  Recent Labs  14/78/2900/10/05 0445 03/31/15 0402 04/01/15 0417 04/05/15 04/12/15 07/11/15  NA 140 140 140 143 140 143  K 4.3 4.2 4.1 4.4 4.5 4.7  CL 105 105 105  --   --   --   CO2 27 27 26   --   --   --   GLUCOSE 95 98 105*  --   --   --   BUN 36* 34* 30* 11 16 16   CREATININE 0.98 0.98 0.98 0.8 1.2* 0.9  CALCIUM 8.4* 8.5* 8.6*  --   --   --    Liver Function Tests:  Recent Labs  03/29/15 1226 03/30/15 0445 04/01/15 0417  AST 24 19 22   ALT 33 28 23  ALKPHOS 51 47 55  BILITOT 0.8 0.7 0.9  PROT 5.7* 5.1* 5.4*  ALBUMIN 2.9* 2.7* 2.8*   No results for input(s): LIPASE, AMYLASE in the last 8760 hours. No results for input(s): AMMONIA in the last 8760 hours. CBC:  Recent Labs  03/28/15 0517 03/29/15 1226 03/30/15 0445 03/31/15 0402 04/01/15 0417 04/05/15 04/12/15 07/11/15  WBC 16.4* 16.9* 16.1* 15.5* 13.9* 11.2 7.9 11.0  NEUTROABS 11.1* 12.4* 11.0*  --   --   --   --   --   HGB 9.0* 8.9* 8.3* 8.6* 9.0* 8.6* 9.5* 11.6*  HCT 28.5* 28.2* 25.4* 27.5* 29.4* 30* 32* 39  MCV 97.6 97.9 95.5 98.9 99.7  --   --   --   PLT 399 429* 366 421* 403* 404* 361 397     Assessment/Plan  ITD Apply nystatin cream to right inner thigh area for fungal rash. Skin care, wicking of moisture is important to help prevent skin breakdown  Hypothyroidism Continue levothyroxine 88 g daily at present. Check TSH level  Hyperlipidemia Check lipid panel. Continue atorvastatin 20 mg daily.  Chronic constipation Currently on senna S1 tablet daily at bedtime and MiraLAX daily. Complains of straining with  stool. Change her senna S to 2 tablets at bedtime for now. Hydration to be maintained.   Shoulder OA Currently on Tylenol 500 mg twice a day with Norco 5-3 25 mg 1 tablet every 8  hours as needed. To be followed by orthopedic. Continue to work with therapy team to help with range of motion exercise. Continue vitamin D supplement.   Hypertensive heart disease with heart failure Euvolemic this visit. Continue amlodipine, furosemide and carvedilol. Continue potassium supplement. Continue her oxygen  ckd stage 3 Monitor bmp  Labs ordered-TSH, lipid panel 01/16/16  [name], MD Internal Medicine [facility] [address] [place], [place] [address] Cell Phone (Monday-Friday 8 am - 5 pm): [phone] On Call: [phone] and follow prompts after 5 pm and on weekends Office Phone: [phone] Office Fax: [phone]

## 2016-01-16 LAB — LIPID PANEL
CHOLESTEROL: 151 mg/dL (ref 0–200)
HDL: 37 mg/dL (ref 35–70)
LDL Cholesterol: 87 mg/dL
TRIGLYCERIDES: 131 mg/dL (ref 40–160)

## 2016-01-16 LAB — TSH: TSH: 3.87 u[IU]/mL (ref 0.41–5.90)

## 2016-01-26 ENCOUNTER — Other Ambulatory Visit: Payer: Self-pay

## 2016-01-26 MED ORDER — LORAZEPAM 1 MG PO TABS: 1.0000 mg | ORAL_TABLET | Freq: Two times a day (BID) | ORAL | 5 refills | Status: DC

## 2016-01-26 NOTE — Telephone Encounter (Signed)
Rx faxed to [facility] @ [phone], phone number [phone]  

## 2016-01-31 ENCOUNTER — Encounter: Payer: Self-pay | Admitting: Family

## 2016-01-31 ENCOUNTER — Non-Acute Institutional Stay (SKILLED_NURSING_FACILITY): Payer: Medicare Other | Admitting: Family

## 2016-01-31 DIAGNOSIS — I5032 Chronic diastolic (congestive) heart failure: Secondary | ICD-10-CM | POA: Diagnosis not present

## 2016-01-31 DIAGNOSIS — R059 Cough, unspecified: Secondary | ICD-10-CM

## 2016-01-31 DIAGNOSIS — R05 Cough: Secondary | ICD-10-CM | POA: Diagnosis not present

## 2016-01-31 DIAGNOSIS — I110 Hypertensive heart disease with heart failure: Secondary | ICD-10-CM | POA: Diagnosis not present

## 2016-01-31 NOTE — Progress Notes (Signed)
Patient ID: Debra Johnston, female   DOB: [birth year], 82 y.o.   MRN: [id number]  Location:  [facility] Room Number: 503A Place of Service:  SNF (31) Provider:  Jaivion Kingsley FNP-C   Oneal GroutPANDEY, MAHIMA, MD  Patient Care Team: Oneal GroutMahima Pandey, MD as PCP - General (Internal Medicine)  Extended Emergency Contact Information Primary Emergency Contact: Crichlow,Leroy Address: [address]          [place], [place] [id number] [place] Home Phone: [phone] Mobile Phone: [phone] Relation: Spouse  Code Status: DNR  Goals of care: Advanced Directive information Advanced Directives 01/31/2016  Does Patient Have a Medical Advance Directive? Yes  Type of Advance Directive Out of facility DNR (pink MOST or yellow form)  Does patient want to make changes to medical advance directive? -  Copy of Healthcare Power of Attorney in Chart? -  Would patient like information on creating a medical advance directive? -  Pre-existing out of facility DNR order (yellow form or pink MOST form) Yellow form placed in chart (order not valid for inpatient use)     Chief Complaint  Patient presents with  . Acute Visit    cough    HPI:  Pt is a 82 y.o. female [facility] for an acute visit for evaluation of cough. She has a significant medical history of CHF, HTN, Afib, oxygen dependent among other conditions. She is seen in her room today. She complains of non-productive cough for the past three days.  She states had a low grade fever past few days but resolved. She has  Had albuterol Nebulizer treatment with some relief. Chest X-ray done 01/30/2016 showed no acute  abnormalities.   Past Medical History:  Diagnosis Date  . Anxiety   . Bronchitis 02/22/2011  . CHF (congestive heart failure) (HCC)    felt to have diastolic dysfunction with normal EF at 60%  . Chronic pain   . CKD (chronic kidney disease) stage 4, GFR 15-29  ml/min (HCC)   . Depression   . DOE (dyspnea on exertion)    chronic   . Falls   . Fibromyalgia    with chronic weakness  . GERD (gastroesophageal reflux disease)   . Glaucoma   . Hyperlipidemia   . Hypertension   . Hypothyroidism   . Osteoarthritis   . PAF (paroxysmal atrial fibrillation) (HCC)    not felt to be a candidate for coumadin  . Physical deconditioning    Past Surgical History:  Procedure Laterality Date  . APPENDECTOMY     as a teenage  . CESAREAN SECTION    . CHOLECYSTECTOMY     pt denies having cholecystectomy  . FEMUR IM NAIL Right 03/23/2015   Procedure: INTRAMEDULLARY (IM) NAIL FEMORAL;  Surgeon: Durene RomansMatthew Olin, MD;  Location: [facility];  Service: Orthopedics;  Laterality: Right;  . HEMORRHOID SURGERY    . KNEE SURGERY     bilateral  . LAPAROSCOPIC HYSTERECTOMY    . TRANSTHORACIC ECHOCARDIOGRAM  02/2011   EF 60% with diastolic dysfunction, high filling pressures    Allergies  Allergen Reactions  . Penicillins Swelling and Rash      Medication List       Accurate as of 01/31/16 10:48 AM. Always use your most recent med list.          acetaminophen 500 MG tablet Commonly known as:  TYLENOL Take 500 mg by mouth 2 (two) times daily.  albuterol (2.5 MG/3ML) 0.083% nebulizer solution Commonly known as:  PROVENTIL Take 3 mLs (2.5 mg total) by nebulization every 4 (four) hours as needed for wheezing or shortness of breath.   amLODipine 2.5 MG tablet Commonly known as:  NORVASC Take 2.5 mg by mouth daily.   aspirin 325 MG EC tablet Take 325 mg by mouth daily.   atorvastatin 20 MG tablet Commonly known as:  LIPITOR Take 20 mg by mouth daily.   baclofen 10 MG tablet Commonly known as:  LIORESAL Take 10 mg by mouth at bedtime. Tablet at bedtime and every 8 hrs as needed   bisacodyl 5 MG EC tablet Commonly known as:  DULCOLAX Take 1 tablet (5 mg total) by mouth daily as needed for moderate constipation.   carvedilol 25 MG tablet Commonly known  as:  COREG Take 25 mg by mouth 2 (two) times daily with a meal. Hold if sbp < 110   CERTAVITE SENIOR/ANTIOXIDANT Tabs Take 1 tablet by mouth daily.   dorzolamide 2 % ophthalmic solution Commonly known as:  TRUSOPT Place 1 drop into both eyes 2 (two) times daily.   ferrous sulfate 325 (65 FE) MG tablet Take 325 mg by mouth daily.   FLUoxetine HCl 60 MG Tabs Take 60 mg by mouth daily.   furosemide 40 MG tablet Commonly known as:  LASIX Take 40 mg by mouth daily. Hold if sbp < 110   guaiFENesin 600 MG 12 hr tablet Commonly known as:  MUCINEX Take 600 mg by mouth 2 (two) times daily.   HYDROcodone-acetaminophen 5-325 MG tablet Commonly known as:  NORCO/VICODIN Take one tablet by mouth every 8 hours as needed for severe pain. Do not exceed 4gm of Tylenol in 24 hours   levothyroxine 88 MCG tablet Commonly known as:  SYNTHROID Take 1 tablet (88 mcg total) by mouth daily before breakfast.   LORazepam 1 MG tablet Commonly known as:  ATIVAN Take 1 tablet (1 mg total) by mouth 2 (two) times daily. Take 1 additional by mouth daily as needed in the afternoon for severe anxiety   magnesium oxide 400 MG tablet Commonly known as:  MAG-OX Take 1,600 mg by mouth daily.   nitroGLYCERIN 0.4 MG SL tablet Commonly known as:  NITROSTAT Place 0.4 mg under the tongue every 5 (five) minutes as needed for chest pain. If pain is unrelived after 3 doses, call MD   nystatin cream Commonly known as:  MYCOSTATIN Apply 1 application topically 2 (two) times daily.   OLANZapine 5 MG tablet Commonly known as:  ZYPREXA Take 5 mg by mouth daily.   oxybutynin 5 MG tablet Commonly known as:  DITROPAN Take 5 mg by mouth 2 (two) times daily.   OXYGEN Inhale into the lungs. Titrate 2-6 LPM to keep O2 stats > 93%   pantoprazole 40 MG tablet Commonly known as:  PROTONIX Take 40 mg by mouth daily before breakfast. 1 hour before breakfast   polyethylene glycol packet Commonly known as:  MIRALAX /  GLYCOLAX Take 17 g by mouth daily.   potassium chloride SA 20 MEQ tablet Commonly known as:  K-DUR,KLOR-CON Take 20 mEq by mouth daily. Reported on 04/04/2015   prochlorperazine 5 MG tablet Commonly known as:  COMPAZINE Take 5 mg by mouth every 6 (six) hours as needed for nausea or vomiting.   senna 8.6 MG tablet Commonly known as:  SENOKOT Take 1 tablet by mouth at bedtime.   UNABLE TO FIND Med Name: Med pass 60 mL  by mouth 2 times daily   VITAMIN D-3 PO Take 1,000 Units by mouth daily.       Review of Systems  Constitutional: Negative for activity change, appetite change, chills, fatigue and fever.  HENT: Negative.  Negative for rhinorrhea, sinus pressure, sneezing and sore throat.   Eyes: Negative.   Respiratory: Positive for cough and wheezing. Negative for chest tightness and stridor.        Oxygen via nasal cannula   Cardiovascular: Negative for chest pain, palpitations and leg swelling.  Gastrointestinal: Negative for abdominal distention, abdominal pain, constipation, diarrhea, nausea and vomiting.  Endocrine: Negative for cold intolerance, heat intolerance, polydipsia, polyphagia and polyuria.  Genitourinary: Negative for dysuria, flank pain, frequency and urgency.  Musculoskeletal: Positive for gait problem.       Left shoulder, elbow pain   Skin: Negative for color change, pallor and rash.  Neurological: Negative for dizziness, tremors, seizures, syncope and headaches.  Hematological: Does not bruise/bleed easily.  Psychiatric/Behavioral: Negative for agitation, confusion, hallucinations and sleep disturbance.    Immunization History  Administered Date(s) Administered  . Influenza Split 11/20/2010  . Influenza,inj,Quad PF,36+ Mos 01/02/2013  . Influenza-Unspecified 11/29/2015  . PPD Test 03/23/2015, 04/19/2015  . Pneumococcal Polysaccharide-23 02/23/2011   Pertinent  Health Maintenance Due  Topic Date Due  . DEXA SCAN  02/20/2016 (Originally 07/12/1994)    . PNA vac Low Risk Adult (2 of 2 - PCV13) 02/20/2016 (Originally 02/23/2012)  . INFLUENZA VACCINE  Completed   No flowsheet data found. Functional Status Survey:    Vitals:   01/31/16 1028  BP: (!) 134/48  Pulse: 73  Resp: 20  Temp: 99.2 F (37.3 C)  TempSrc: Oral  SpO2: 96%  Weight: 208 lb (94.3 kg)   Body mass index is 29.84 kg/m. Physical Exam  Constitutional: She is oriented to person, place, and time. She appears well-nourished. No distress.  obese  HENT:  Head: Normocephalic.  Mouth/Throat: Oropharynx is clear and moist.  Eyes: Conjunctivae and EOM are normal. Pupils are equal, round, and reactive to light. Right eye exhibits no discharge. Left eye exhibits no discharge. No scleral icterus.  Neck: Normal range of motion. No JVD present.  Cardiovascular: Normal rate, regular rhythm, normal heart sounds and intact distal pulses.  Exam reveals no gallop and no friction rub.   No murmur heard. Pulmonary/Chest: Effort normal. No respiratory distress.   Oxygen 2 liters via [place] in place. Bilateral scattered rales and wheezes noted.   Abdominal: Soft. Bowel sounds are normal. She exhibits no distension. There is no tenderness. There is no rebound and no guarding.  Genitourinary:  Genitourinary Comments: Incontinent B/B   Musculoskeletal: She exhibits no edema, tenderness or deformity.  Limited ROM to left shoulder and elbow. Left elbow popping sound with ROM.   Lymphadenopathy:    She has no cervical adenopathy.  Neurological: She is oriented to person, place, and time.  Skin: Skin is warm and dry. No rash noted. No erythema. No pallor.  Psychiatric: She has a normal mood and affect.    Labs reviewed:  Recent Labs  03/30/15 0445 03/31/15 0402 04/01/15 0417 04/05/15 04/12/15 07/11/15  NA 140 140 140 143 140 143  K 4.3 4.2 4.1 4.4 4.5 4.7  CL 105 105 105  --   --   --   CO2 27 27 26   --   --   --   GLUCOSE 95 98 105*  --   --   --   BUN 36*  34* 30* 11 16 16    CREATININE 0.98 0.98 0.98 0.8 1.2* 0.9  CALCIUM 8.4* 8.5* 8.6*  --   --   --     Recent Labs  03/29/15 1226 03/30/15 0445 04/01/15 0417  AST 24 19 22   ALT 33 28 23  ALKPHOS 51 47 55  BILITOT 0.8 0.7 0.9  PROT 5.7* 5.1* 5.4*  ALBUMIN 2.9* 2.7* 2.8*    Recent Labs  03/28/15 0517 03/29/15 1226 03/30/15 0445 03/31/15 0402 04/01/15 0417 04/05/15 04/12/15 07/11/15  WBC 16.4* 16.9* 16.1* 15.5* 13.9* 11.2 7.9 11.0  NEUTROABS 11.1* 12.4* 11.0*  --   --   --   --   --   HGB 9.0* 8.9* 8.3* 8.6* 9.0* 8.6* 9.5* 11.6*  HCT 28.5* 28.2* 25.4* 27.5* 29.4* 30* 32* 39  MCV 97.6 97.9 95.5 98.9 99.7  --   --   --   PLT 399 429* 366 421* 403* 404* 361 397   Lab Results  Component Value Date   TSH 3.87 01/16/2016   Lab Results  Component Value Date   HGBA1C 5.6 07/11/2015   Lab Results  Component Value Date   CHOL 151 01/16/2016   HDL 37 01/16/2016   LDLCALC 87 01/16/2016   TRIG 131 01/16/2016   Assessment/Plan Cough  Afebrile. Portable CXR Pa/Lat negative for acute abnormalities. Bilateral rales and wheezes noted on examination. Will Change Albuterol 2.5-3 mg/3 ml solution to inhale 3 ml every 6 hours X 5 days then resume Albuterol 2.5-3 mg/3 ml solution inhale every 4 hours PRN. Discontinue previous Furosemide orders then start Furosemide 40 mg Tablet Twice daily X 3 days then resume Furosemide 40 mg Tablet daily. Check CBC/diff and BMP 02/01/2016. Monitor vital signs every shift X 5 days. Monitor daily weight x 4 weeks then resume weekly weight.   CHF Has had 6 pounds weight gain over one month.see above orders. Continue to monitor.   HTN  B/p stable. Continue current meds. Monitor vital signs every shift per above orders.   Family/ staff Communication: Reviewed plan with patient and facility Nurse supervisor.  Labs/tests ordered: CBC/diff and BMP 02/01/2016.

## 2016-02-02 ENCOUNTER — Non-Acute Institutional Stay (SKILLED_NURSING_FACILITY): Payer: Medicare Other | Admitting: Family

## 2016-02-02 DIAGNOSIS — I5032 Chronic diastolic (congestive) heart failure: Secondary | ICD-10-CM | POA: Diagnosis not present

## 2016-02-02 DIAGNOSIS — Z9981 Dependence on supplemental oxygen: Secondary | ICD-10-CM

## 2016-02-02 NOTE — Progress Notes (Signed)
Location:  [facility] Room Number: 503 A  Place of Service:  SNF (31) Provider:  Bocephus Cali FNP-C   Oneal GroutPANDEY, MAHIMA, MD  Patient Care Team: Oneal GroutMahima Pandey, MD as PCP - General (Internal Medicine)  Extended Emergency Contact Information Primary Emergency Contact: Schwandt,Leroy Address: [address]          [place], [place] [address] [place] Home Phone: [phone] Mobile Phone: [phone] Relation: Spouse  Code Status: DNR  Goals of care: Advanced Directive information Advanced Directives 01/31/2016  Does Patient Have a Medical Advance Directive? Yes  Type of Advance Directive Out of facility DNR (pink MOST or yellow form)  Does patient want to make changes to medical advance directive? -  Copy of Healthcare Power of Attorney in Chart? -  Would patient like information on creating a medical advance directive? -  Pre-existing out of facility DNR order (yellow form or pink MOST form) Yellow form placed in chart (order not valid for inpatient use)     Chief Complaint  Patient presents with  . Acute Visit    Follow up cough     HPI:  Pt is a 82 y.o. female [facility] for an acute visit for follow up cough and shortness of breath. She has a significant medical history of HTN, CHF, Afib among other conditions. She is seen in her room today with Husband at bedside. She states still has occasional cough though has improved. Previous shortness of breath resolved with recent prescribed Albuterol treatments and furosemide dose adjustment. She denies any fever or chest.She has had no abrupt weigh gain or edema. Facility Nurse reports no new issues. Recent Lab results showed WBC 6.5 and normal BMP ( 01/31/2016).     Past Medical History:  Diagnosis Date  . Anxiety   . Bronchitis 02/22/2011  . CHF (congestive heart failure) (HCC)    felt to have diastolic dysfunction with normal EF at 60%  . Chronic  pain   . CKD (chronic kidney disease) stage 4, GFR 15-29 ml/min (HCC)   . Depression   . DOE (dyspnea on exertion)    chronic   . Falls   . Fibromyalgia    with chronic weakness  . GERD (gastroesophageal reflux disease)   . Glaucoma   . Hyperlipidemia   . Hypertension   . Hypothyroidism   . Osteoarthritis   . PAF (paroxysmal atrial fibrillation) (HCC)    not felt to be a candidate for coumadin  . Physical deconditioning    Past Surgical History:  Procedure Laterality Date  . APPENDECTOMY     as a teenage  . CESAREAN SECTION    . CHOLECYSTECTOMY     pt denies having cholecystectomy  . FEMUR IM NAIL Right 03/23/2015   Procedure: INTRAMEDULLARY (IM) NAIL FEMORAL;  Surgeon: Durene RomansMatthew Olin, MD;  Location: [facility];  Service: Orthopedics;  Laterality: Right;  . HEMORRHOID SURGERY    . KNEE SURGERY     bilateral  . LAPAROSCOPIC HYSTERECTOMY    . TRANSTHORACIC ECHOCARDIOGRAM  02/2011   EF 60% with diastolic dysfunction, high filling pressures    Allergies  Allergen Reactions  . Penicillins Swelling and Rash      Medication List       Accurate as of 02/02/16  3:02 PM. Always use your most recent med list.          acetaminophen 500 MG tablet Commonly known as:  TYLENOL  Take 500 mg by mouth 2 (two) times daily.   albuterol (2.5 MG/3ML) 0.083% nebulizer solution Commonly known as:  PROVENTIL Take 3 mLs (2.5 mg total) by nebulization every 4 (four) hours as needed for wheezing or shortness of breath.   amLODipine 2.5 MG tablet Commonly known as:  NORVASC Take 2.5 mg by mouth daily.   aspirin 325 MG EC tablet Take 325 mg by mouth daily.   atorvastatin 20 MG tablet Commonly known as:  LIPITOR Take 20 mg by mouth daily.   baclofen 10 MG tablet Commonly known as:  LIORESAL Take 10 mg by mouth at bedtime. Tablet at bedtime and every 8 hrs as needed   bisacodyl 5 MG EC tablet Commonly known as:  DULCOLAX Take 1 tablet (5 mg total) by mouth daily as needed for moderate  constipation.   carvedilol 25 MG tablet Commonly known as:  COREG Take 25 mg by mouth 2 (two) times daily with a meal. Hold if sbp < 110   CERTAVITE SENIOR/ANTIOXIDANT Tabs Take 1 tablet by mouth daily.   dorzolamide 2 % ophthalmic solution Commonly known as:  TRUSOPT Place 1 drop into both eyes 2 (two) times daily.   ferrous sulfate 325 (65 FE) MG tablet Take 325 mg by mouth daily.   FLUoxetine HCl 60 MG Tabs Take 60 mg by mouth daily.   furosemide 40 MG tablet Commonly known as:  LASIX Take 40 mg by mouth daily. Hold if sbp < 110   guaiFENesin 600 MG 12 hr tablet Commonly known as:  MUCINEX Take 600 mg by mouth 2 (two) times daily.   HYDROcodone-acetaminophen 5-325 MG tablet Commonly known as:  NORCO/VICODIN Take one tablet by mouth every 8 hours as needed for severe pain. Do not exceed 4gm of Tylenol in 24 hours   levothyroxine 88 MCG tablet Commonly known as:  SYNTHROID Take 1 tablet (88 mcg total) by mouth daily before breakfast.   LORazepam 1 MG tablet Commonly known as:  ATIVAN Take 1 tablet (1 mg total) by mouth 2 (two) times daily. Take 1 additional by mouth daily as needed in the afternoon for severe anxiety   magnesium oxide 400 MG tablet Commonly known as:  MAG-OX Take 1,600 mg by mouth daily.   nitroGLYCERIN 0.4 MG SL tablet Commonly known as:  NITROSTAT Place 0.4 mg under the tongue every 5 (five) minutes as needed for chest pain. If pain is unrelived after 3 doses, call MD   nystatin cream Commonly known as:  MYCOSTATIN Apply 1 application topically 2 (two) times daily.   OLANZapine 5 MG tablet Commonly known as:  ZYPREXA Take 5 mg by mouth daily.   oxybutynin 5 MG tablet Commonly known as:  DITROPAN Take 5 mg by mouth 2 (two) times daily.   OXYGEN Inhale into the lungs. Titrate 2-6 LPM to keep O2 stats > 93%   pantoprazole 40 MG tablet Commonly known as:  PROTONIX Take 40 mg by mouth daily before breakfast. 1 hour before breakfast     polyethylene glycol packet Commonly known as:  MIRALAX / GLYCOLAX Take 17 g by mouth daily.   potassium chloride SA 20 MEQ tablet Commonly known as:  K-DUR,KLOR-CON Take 20 mEq by mouth daily. Reported on 04/04/2015   prochlorperazine 5 MG tablet Commonly known as:  COMPAZINE Take 5 mg by mouth every 6 (six) hours as needed for nausea or vomiting.   senna 8.6 MG tablet Commonly known as:  SENOKOT Take 1 tablet by mouth at  bedtime.   UNABLE TO FIND Med Name: Med pass 60 mL by mouth 2 times daily   VITAMIN D-3 PO Take 1,000 Units by mouth daily.       Review of Systems  Constitutional: Negative for activity change, appetite change, chills, fatigue and fever.  HENT: Negative.  Negative for rhinorrhea, sinus pressure, sneezing and sore throat.   Eyes: Negative.   Respiratory: Negative for chest tightness and stridor.        Oxygen via nasal cannula. Cough and wheezing has improvement.   Cardiovascular: Negative for chest pain, palpitations and leg swelling.  Gastrointestinal: Negative for abdominal distention, abdominal pain, constipation, diarrhea, nausea and vomiting.  Endocrine: Negative for polyuria.  Genitourinary: Negative for dysuria, frequency and urgency.  Skin: Negative for color change, pallor and rash.  Neurological: Negative for dizziness, light-headedness and headaches.  Psychiatric/Behavioral: Negative for agitation, confusion, hallucinations and sleep disturbance.    Immunization History  Administered Date(s) Administered  . Influenza Split 11/20/2010  . Influenza,inj,Quad PF,36+ Mos 01/02/2013  . Influenza-Unspecified 11/29/2015  . PPD Test 03/23/2015, 04/19/2015  . Pneumococcal Polysaccharide-23 02/23/2011   Pertinent  Health Maintenance Due  Topic Date Due  . DEXA SCAN  02/20/2016 (Originally 07/12/1994)  . PNA vac Low Risk Adult (2 of 2 - PCV13) 02/20/2016 (Originally 02/23/2012)  . INFLUENZA VACCINE  Completed      Vitals:   02/02/16 1200   Resp: 18  Weight: 200 lb 9.6 oz (91 kg)  Height: 5\' 10"  (1.778 m)   Body mass index is 28.78 kg/m. Physical Exam  Constitutional: She is oriented to person, place, and time. She appears well-nourished. No distress.  obese  HENT:  Head: Normocephalic.  Mouth/Throat: Oropharynx is clear and moist.  Eyes: Conjunctivae and EOM are normal. Pupils are equal, round, and reactive to light. Right eye exhibits no discharge. Left eye exhibits no discharge. No scleral icterus.  Neck: Normal range of motion. No JVD present.  Cardiovascular: Normal rate, regular rhythm, normal heart sounds and intact distal pulses.  Exam reveals no gallop and no friction rub.   No murmur heard. Pulmonary/Chest: Effort normal. No respiratory distress.   Oxygen 2 liters via [place] in place.Bilateral diminished wheezes noted.   Abdominal: Soft. Bowel sounds are normal. She exhibits no distension. There is no tenderness. There is no rebound and no guarding.  Genitourinary:  Genitourinary Comments: Incontinent B/B   Musculoskeletal: She exhibits no edema, tenderness or deformity.  Lymphadenopathy:    She has no cervical adenopathy.  Neurological: She is oriented to person, place, and time.  Skin: Skin is warm and dry. No rash noted. No erythema. No pallor.  Psychiatric: She has a normal mood and affect.    Labs reviewed:  Recent Labs  03/30/15 0445 03/31/15 0402 04/01/15 0417 04/05/15 04/12/15 07/11/15  NA 140 140 140 143 140 143  K 4.3 4.2 4.1 4.4 4.5 4.7  CL 105 105 105  --   --   --   CO2 27 27 26   --   --   --   GLUCOSE 95 98 105*  --   --   --   BUN 36* 34* 30* 11 16 16   CREATININE 0.98 0.98 0.98 0.8 1.2* 0.9  CALCIUM 8.4* 8.5* 8.6*  --   --   --     Recent Labs  03/29/15 1226 03/30/15 0445 04/01/15 0417  AST 24 19 22   ALT 33 28 23  ALKPHOS 51 47 55  BILITOT 0.8 0.7 0.9  PROT 5.7* 5.1* 5.4*  ALBUMIN 2.9* 2.7* 2.8*    Recent Labs  03/28/15 0517 03/29/15 1226 03/30/15 0445 03/31/15 0402  04/01/15 0417 04/05/15 04/12/15 07/11/15  WBC 16.4* 16.9* 16.1* 15.5* 13.9* 11.2 7.9 11.0  NEUTROABS 11.1* 12.4* 11.0*  --   --   --   --   --   HGB 9.0* 8.9* 8.3* 8.6* 9.0* 8.6* 9.5* 11.6*  HCT 28.5* 28.2* 25.4* 27.5* 29.4* 30* 32* 39  MCV 97.6 97.9 95.5 98.9 99.7  --   --   --   PLT 399 429* 366 421* 403* 404* 361 397   Lab Results  Component Value Date   TSH 3.87 01/16/2016   Lab Results  Component Value Date   HGBA1C 5.6 07/11/2015   Lab Results  Component Value Date   CHOL 151 01/16/2016   HDL 37 01/16/2016   LDLCALC 87 01/16/2016   TRIG 131 01/16/2016    Assessment/Plan  Chronic diastolic CHF (congestive heart failure) (HCC) No abrupt weight gain. No edema. Cough has improved. Continue on Furosemide and Coreg. Continue to monitor vital signs every shift.  Oxygen dependent  Continue on oxygen 2 L via [place]     Family/ staff Communication: Reviewed plan of care with patient, patient's Husband and facility Nurse supervisor.   Labs/tests ordered: None

## 2016-02-08 ENCOUNTER — Non-Acute Institutional Stay (SKILLED_NURSING_FACILITY): Payer: Medicare Other | Admitting: Family

## 2016-02-08 DIAGNOSIS — N183 Chronic kidney disease, stage 3 unspecified: Secondary | ICD-10-CM

## 2016-02-08 DIAGNOSIS — I110 Hypertensive heart disease with heart failure: Secondary | ICD-10-CM | POA: Diagnosis not present

## 2016-02-08 DIAGNOSIS — F411 Generalized anxiety disorder: Secondary | ICD-10-CM

## 2016-02-08 DIAGNOSIS — I5032 Chronic diastolic (congestive) heart failure: Secondary | ICD-10-CM

## 2016-02-08 DIAGNOSIS — E039 Hypothyroidism, unspecified: Secondary | ICD-10-CM | POA: Diagnosis not present

## 2016-02-08 DIAGNOSIS — E782 Mixed hyperlipidemia: Secondary | ICD-10-CM

## 2016-02-08 NOTE — Progress Notes (Signed)
Location:  [facility] Room Number: 503 A  Place of Service:  SNF (31) Provider:  Dinah Ngetich FNP-C   Debra Grout, MD  Patient Care Team: Debra Grout, MD as PCP - General (Internal Medicine)  Extended Emergency Contact Information Primary Emergency Contact: Debra Johnston Address: [address]          [place], [place] [address] [place] Home Phone: [phone] Mobile Phone: [phone] Relation: Spouse  Code Status:  DNR  Goals of care: Advanced Directive information Advanced Directives 01/31/2016  Does Patient Have a Medical Advance Directive? Yes  Type of Advance Directive Out of facility DNR (pink MOST or yellow form)  Does patient want to make changes to medical advance directive? -  Copy of Healthcare Power of Attorney in Chart? -  Would patient like information on creating a medical advance directive? -  Pre-existing out of facility DNR order (yellow form or pink MOST form) Yellow form placed in chart (order not valid for inpatient use)     Chief Complaint  Patient presents with  . Medical Management of Chronic Issues    HPI:  Pt is a 82 y.o. female seen today at [facility] for medical management of chronic diseases. She has a medical history of HTN, CHF, Hypothyroidism, CKD stage 3, Hyperlipidemia among other conditions. She is seen in her room today. She states recent cough not resolved. She reports occasional shortness of breath. Previous portable CXR was negative for any abnormalities. She denies any fever or chills. Facility Nurse reports no new concerns. No recent abrupt weight gain, fall episodes or hospital admission.    Past Medical History:  Diagnosis Date  . Anxiety   . Bronchitis 02/22/2011  . CHF (congestive heart failure) (HCC)    felt to have diastolic dysfunction with normal EF at 60%  . Chronic pain   . CKD (chronic kidney disease) stage 4, GFR 15-29 ml/min (HCC)   .  Depression   . DOE (dyspnea on exertion)    chronic   . Falls   . Fibromyalgia    with chronic weakness  . GERD (gastroesophageal reflux disease)   . Glaucoma   . Hyperlipidemia   . Hypertension   . Hypothyroidism   . Osteoarthritis   . PAF (paroxysmal atrial fibrillation) (HCC)    not felt to be a candidate for coumadin  . Physical deconditioning    Past Surgical History:  Procedure Laterality Date  . APPENDECTOMY     as a teenage  . CESAREAN SECTION    . CHOLECYSTECTOMY     pt denies having cholecystectomy  . FEMUR IM NAIL Right 03/23/2015   Procedure: INTRAMEDULLARY (IM) NAIL FEMORAL;  Surgeon: Durene Romans, MD;  Location: [facility];  Service: Orthopedics;  Laterality: Right;  . HEMORRHOID SURGERY    . KNEE SURGERY     bilateral  . LAPAROSCOPIC HYSTERECTOMY    . TRANSTHORACIC ECHOCARDIOGRAM  02/2011   EF 60% with diastolic dysfunction, high filling pressures    Allergies  Allergen Reactions  . Penicillins Swelling and Rash    Allergies as of 02/08/2016      Reactions   Penicillins Swelling, Rash      Medication List       Accurate as of 02/08/16  6:13 PM. Always use your most recent med list.          acetaminophen 500 MG tablet Commonly known as:  TYLENOL Take 500 mg by mouth  2 (two) times daily.   albuterol (2.5 MG/3ML) 0.083% nebulizer solution Commonly known as:  PROVENTIL Take 3 mLs (2.5 mg total) by nebulization every 4 (four) hours as needed for wheezing or shortness of breath.   amLODipine 2.5 MG tablet Commonly known as:  NORVASC Take 2.5 mg by mouth daily.   aspirin 325 MG EC tablet Take 325 mg by mouth daily.   atorvastatin 20 MG tablet Commonly known as:  LIPITOR Take 20 mg by mouth daily.   baclofen 10 MG tablet Commonly known as:  LIORESAL Take 10 mg by mouth at bedtime. Tablet at bedtime and every 8 hrs as needed   bisacodyl 5 MG EC tablet Commonly known as:  DULCOLAX Take 1 tablet (5 mg total) by mouth daily as needed for  moderate constipation.   carvedilol 25 MG tablet Commonly known as:  COREG Take 25 mg by mouth 2 (two) times daily with a meal. Hold if sbp < 110   CERTAVITE SENIOR/ANTIOXIDANT Tabs Take 1 tablet by mouth daily.   dorzolamide 2 % ophthalmic solution Commonly known as:  TRUSOPT Place 1 drop into both eyes 2 (two) times daily.   ferrous sulfate 325 (65 FE) MG tablet Take 325 mg by mouth daily.   FLUoxetine HCl 60 MG Tabs Take 60 mg by mouth daily.   furosemide 40 MG tablet Commonly known as:  LASIX Take 40 mg by mouth daily. Hold if sbp < 110   guaiFENesin 600 MG 12 hr tablet Commonly known as:  MUCINEX Take 600 mg by mouth 2 (two) times daily.   HYDROcodone-acetaminophen 5-325 MG tablet Commonly known as:  NORCO/VICODIN Take one tablet by mouth every 8 hours as needed for severe pain. Do not exceed 4gm of Tylenol in 24 hours   levothyroxine 88 MCG tablet Commonly known as:  SYNTHROID Take 1 tablet (88 mcg total) by mouth daily before breakfast.   LORazepam 1 MG tablet Commonly known as:  ATIVAN Take 1 tablet (1 mg total) by mouth 2 (two) times daily. Take 1 additional by mouth daily as needed in the afternoon for severe anxiety   magnesium oxide 400 MG tablet Commonly known as:  MAG-OX Take 1,600 mg by mouth daily.   nitroGLYCERIN 0.4 MG SL tablet Commonly known as:  NITROSTAT Place 0.4 mg under the tongue every 5 (five) minutes as needed for chest pain. If pain is unrelived after 3 doses, call MD   nystatin cream Commonly known as:  MYCOSTATIN Apply 1 application topically 2 (two) times daily.   OLANZapine 5 MG tablet Commonly known as:  ZYPREXA Take 5 mg by mouth daily.   oxybutynin 5 MG tablet Commonly known as:  DITROPAN Take 5 mg by mouth 2 (two) times daily.   OXYGEN Inhale into the lungs. Titrate 2-6 LPM to keep O2 stats > 93%   pantoprazole 40 MG tablet Commonly known as:  PROTONIX Take 40 mg by mouth daily before breakfast. 1 hour before  breakfast   polyethylene glycol packet Commonly known as:  MIRALAX / GLYCOLAX Take 17 g by mouth daily.   potassium chloride SA 20 MEQ tablet Commonly known as:  K-DUR,KLOR-CON Take 20 mEq by mouth daily. Reported on 04/04/2015   prochlorperazine 5 MG tablet Commonly known as:  COMPAZINE Take 5 mg by mouth every 6 (six) hours as needed for nausea or vomiting.   senna 8.6 MG tablet Commonly known as:  SENOKOT Take 1 tablet by mouth at bedtime.   UNABLE TO FIND  Med Name: Med pass 60 mL by mouth 2 times daily   VITAMIN D-3 PO Take 1,000 Units by mouth daily.       Review of Systems  Constitutional: Negative for activity change, appetite change, chills, fatigue and fever.  HENT: Negative.  Negative for rhinorrhea, sinus pressure, sneezing and sore throat.   Eyes: Negative.   Respiratory: Negative for chest tightness and stridor.        Oxygen via nasal cannula. Cough and wheezing persist.   Cardiovascular: Negative for chest pain, palpitations and leg swelling.  Gastrointestinal: Negative for abdominal distention, abdominal pain, constipation, diarrhea, nausea and vomiting.  Endocrine: Negative for cold intolerance, heat intolerance, polydipsia, polyphagia and polyuria.  Genitourinary: Negative for dysuria, frequency and urgency.  Musculoskeletal: Positive for gait problem.  Skin: Negative for color change, pallor and rash.  Neurological: Negative for dizziness, seizures, syncope, light-headedness and headaches.  Hematological: Does not bruise/bleed easily.  Psychiatric/Behavioral: Negative for agitation, confusion, hallucinations and sleep disturbance.    Immunization History  Administered Date(s) Administered  . Influenza Split 11/20/2010  . Influenza,inj,Quad PF,36+ Mos 01/02/2013  . Influenza-Unspecified 11/29/2015  . PPD Test 03/23/2015, 04/19/2015  . Pneumococcal Polysaccharide-23 02/23/2011   Pertinent  Health Maintenance Due  Topic Date Due  . DEXA SCAN   02/20/2016 (Originally 07/12/1994)  . PNA vac Low Risk Adult (2 of 2 - PCV13) 02/20/2016 (Originally 02/23/2012)  . INFLUENZA VACCINE  Completed      Vitals:   02/08/16 1000  BP: 132/73  Pulse: 68  Resp: 18  Temp: 98.2 F (36.8 C)  SpO2: 98%  Weight: 200 lb 3.2 oz (90.8 kg)  Height: 5\' 10"  (1.778 m)   Body mass index is 28.73 kg/m. Physical Exam  Constitutional: She is oriented to person, place, and time. She appears well-nourished. No distress.  obese  HENT:  Head: Normocephalic.  Mouth/Throat: Oropharynx is clear and moist.  Eyes: Conjunctivae and EOM are normal. Pupils are equal, round, and reactive to light. Right eye exhibits no discharge. Left eye exhibits no discharge. No scleral icterus.  Neck: Normal range of motion. No JVD present.  Cardiovascular: Normal rate, regular rhythm, normal heart sounds and intact distal pulses.  Exam reveals no gallop and no friction rub.   No murmur heard. Pulmonary/Chest: Effort normal. No respiratory distress.   Oxygen 2 liters via [place] in place.Bilateral wheezes has improved from previous visit.    Abdominal: Soft. Bowel sounds are normal. She exhibits no distension. There is no tenderness. There is no rebound and no guarding.  Genitourinary:  Genitourinary Comments: Incontinent B/B   Musculoskeletal: She exhibits no edema, tenderness or deformity.  Lymphadenopathy:    She has no cervical adenopathy.  Neurological: She is oriented to person, place, and time.  Skin: Skin is warm and dry. No rash noted. No erythema. No pallor.  Psychiatric: She has a normal mood and affect.    Labs reviewed:  Recent Labs  03/30/15 0445 03/31/15 0402 04/01/15 0417 04/05/15 04/12/15 07/11/15  NA 140 140 140 143 140 143  K 4.3 4.2 4.1 4.4 4.5 4.7  CL 105 105 105  --   --   --   CO2 27 27 26   --   --   --   GLUCOSE 95 98 105*  --   --   --   BUN 36* 34* 30* 11 16 16   CREATININE 0.98 0.98 0.98 0.8 1.2* 0.9  CALCIUM 8.4* 8.5* 8.6*  --   --   --  Recent Labs  03/29/15 1226 03/30/15 0445 04/01/15 0417  AST 24 19 22   ALT 33 28 23  ALKPHOS 51 47 55  BILITOT 0.8 0.7 0.9  PROT 5.7* 5.1* 5.4*  ALBUMIN 2.9* 2.7* 2.8*    Recent Labs  03/28/15 0517 03/29/15 1226 03/30/15 0445 03/31/15 0402 04/01/15 0417 04/05/15 04/12/15 07/11/15  WBC 16.4* 16.9* 16.1* 15.5* 13.9* 11.2 7.9 11.0  NEUTROABS 11.1* 12.4* 11.0*  --   --   --   --   --   HGB 9.0* 8.9* 8.3* 8.6* 9.0* 8.6* 9.5* 11.6*  HCT 28.5* 28.2* 25.4* 27.5* 29.4* 30* 32* 39  MCV 97.6 97.9 95.5 98.9 99.7  --   --   --   PLT 399 429* 366 421* 403* 404* 361 397   Lab Results  Component Value Date   TSH 3.87 01/16/2016   Lab Results  Component Value Date   HGBA1C 5.6 07/11/2015   Lab Results  Component Value Date   CHOL 151 01/16/2016   HDL 37 01/16/2016   LDLCALC 87 01/16/2016   TRIG 131 01/16/2016   Assessment/Plan 1. Hypertensive heart disease with CHF (congestive heart failure) (HCC) B/p stable. Continue on Amlodipine and coreg. Monitor BMP  2. Chronic diastolic CHF (congestive heart failure) (HCC) Has had cough for the past several days. X-ray results negative.Shortness of breath occasional and wheezes persist. Has used Neb treatment with some relief. No edema on exam. Continue on Coreg and Furosemide. Continue on oxygen PRN. Will repeat portable CXR Pa/Lat rule out PNA.    3. Hypothyroidism, unspecified type Continue on levothyroxine 88 mcg tablet. Monitor TSH level.   4. CKD (chronic kidney disease) stage 3, GFR 30-59 ml/min Continue to avoid nephrotoxins and dose all other medications for renal clearance.Continue to monitor BMP.  5. Mixed hyperlipidemia Continue on Lipitor. Monitor lipid panel periodically.   6. GAD (generalized anxiety disorder) Stable. Continue on Alprazolam daily.     Family/ staff Communication: Reviewed plan of care with patient and facility Nurse supervisor.   Labs/tests ordered:  Portable CXR Pa/Lat rule out PNA.

## 2016-02-22 DIAGNOSIS — Z9981 Dependence on supplemental oxygen: Secondary | ICD-10-CM | POA: Diagnosis not present

## 2016-02-22 DIAGNOSIS — R0902 Hypoxemia: Secondary | ICD-10-CM | POA: Diagnosis not present

## 2016-02-22 DIAGNOSIS — J189 Pneumonia, unspecified organism: Secondary | ICD-10-CM | POA: Diagnosis not present

## 2016-03-05 ENCOUNTER — Non-Acute Institutional Stay (SKILLED_NURSING_FACILITY): Payer: Medicare Other | Admitting: Family

## 2016-03-05 DIAGNOSIS — I110 Hypertensive heart disease with heart failure: Secondary | ICD-10-CM | POA: Diagnosis not present

## 2016-03-05 DIAGNOSIS — E039 Hypothyroidism, unspecified: Secondary | ICD-10-CM

## 2016-03-05 DIAGNOSIS — I5032 Chronic diastolic (congestive) heart failure: Secondary | ICD-10-CM

## 2016-03-05 NOTE — Progress Notes (Signed)
Location:  [facility] Room Number: 503 A Place of Service:  SNF (31) Provider:  Dinah Ngetich FNP-C   [name], MD  Patient Care Team: [name], MD as PCP - General (Internal Medicine)  Extended Emergency Contact Information Primary Emergency Contact: Hach,Leroy Address: [address]          [place], [place] [address] [place] Home Phone: [phone] Mobile Phone: [phone] Relation: Spouse  Code Status:  DNR  Goals of care: Advanced Directive information Advanced Directives 01/31/2016  Does Patient Have a Medical Advance Directive? Yes  Type of Advance Directive Out of facility DNR (pink MOST or yellow form)  Does patient want to make changes to medical advance directive? -  Copy of Healthcare Power of Attorney in Chart? -  Would patient like information on creating a medical advance directive? -  Pre-existing out of facility DNR order (yellow form or pink MOST form) Yellow form placed in chart (order not valid for inpatient use)     Chief Complaint  Patient presents with  . Medical Management of Chronic Issues    HPI:  Pt is a 83 y.o. female seen today at [facility] for medical management of chronic diseases. She has a medical history of HTN, CHF, Hypothyroidism, CKD stage 3, Hyperlipidemia, GAD, Fibromyalgia among other conditions. She is seen in her room today. She denies any acute issues this visit No recent weight changes, fall episodes or hospital admission since prior visit.Facility Nurse reports no new concerns.  Past Medical History:  Diagnosis Date  . Anxiety   . Bronchitis 02/22/2011  . CHF (congestive heart failure) (HCC)    felt to have diastolic dysfunction with normal EF at 60%  . Chronic pain   . CKD (chronic kidney disease) stage 4, GFR 15-29 ml/min (HCC)   . Depression   . DOE (dyspnea on exertion)    chronic   . Falls   . Fibromyalgia    with chronic weakness    . GERD (gastroesophageal reflux disease)   . Glaucoma   . Hyperlipidemia   . Hypertension   . Hypothyroidism   . Osteoarthritis   . PAF (paroxysmal atrial fibrillation) (HCC)    not felt to be a candidate for coumadin  . Physical deconditioning    Past Surgical History:  Procedure Laterality Date  . APPENDECTOMY     as a teenage  . CESAREAN SECTION    . CHOLECYSTECTOMY     pt denies having cholecystectomy  . FEMUR IM NAIL Right 03/23/2015   Procedure: INTRAMEDULLARY (IM) NAIL FEMORAL;  Surgeon: Durene Romans, MD;  Location: [facility];  Service: Orthopedics;  Laterality: Right;  . HEMORRHOID SURGERY    . KNEE SURGERY     bilateral  . LAPAROSCOPIC HYSTERECTOMY    . TRANSTHORACIC ECHOCARDIOGRAM  02/2011   EF 60% with diastolic dysfunction, high filling pressures    Allergies  Allergen Reactions  . Penicillins Swelling and Rash    Allergies as of 03/05/2016      Reactions   Penicillins Swelling, Rash      Medication List       Accurate as of 03/05/16  4:34 PM. Always use your most recent med list.          acetaminophen 500 MG tablet Commonly known as:  TYLENOL Take 500 mg by mouth 2 (two) times daily.   albuterol (2.5 MG/3ML) 0.083% nebulizer solution Commonly known as:  PROVENTIL Take  3 mLs (2.5 mg total) by nebulization every 4 (four) hours as needed for wheezing or shortness of breath.   amLODipine 2.5 MG tablet Commonly known as:  NORVASC Take 2.5 mg by mouth daily.   aspirin 325 MG EC tablet Take 325 mg by mouth daily.   atorvastatin 20 MG tablet Commonly known as:  LIPITOR Take 20 mg by mouth daily.   baclofen 10 MG tablet Commonly known as:  LIORESAL Take 10 mg by mouth at bedtime. Tablet at bedtime and every 8 hrs as needed   bisacodyl 5 MG EC tablet Commonly known as:  DULCOLAX Take 1 tablet (5 mg total) by mouth daily as needed for moderate constipation.   carvedilol 25 MG tablet Commonly known as:  COREG Take 25 mg by mouth 2 (two) times  daily with a meal. Hold if sbp < 110   CERTAVITE SENIOR/ANTIOXIDANT Tabs Take 1 tablet by mouth daily.   dorzolamide 2 % ophthalmic solution Commonly known as:  TRUSOPT Place 1 drop into both eyes 2 (two) times daily.   ferrous sulfate 325 (65 FE) MG tablet Take 325 mg by mouth daily.   FLUoxetine HCl 60 MG Tabs Take 60 mg by mouth daily.   furosemide 40 MG tablet Commonly known as:  LASIX Take 40 mg by mouth daily. Hold if sbp < 110   guaiFENesin 600 MG 12 hr tablet Commonly known as:  MUCINEX Take 600 mg by mouth 2 (two) times daily.   HYDROcodone-acetaminophen 5-325 MG tablet Commonly known as:  NORCO/VICODIN Take one tablet by mouth every 8 hours as needed for severe pain. Do not exceed 4gm of Tylenol in 24 hours   levothyroxine 88 MCG tablet Commonly known as:  SYNTHROID Take 1 tablet (88 mcg total) by mouth daily before breakfast.   LORazepam 1 MG tablet Commonly known as:  ATIVAN Take 1 tablet (1 mg total) by mouth 2 (two) times daily. Take 1 additional by mouth daily as needed in the afternoon for severe anxiety   magnesium oxide 400 MG tablet Commonly known as:  MAG-OX Take 1,600 mg by mouth daily.   nitroGLYCERIN 0.4 MG SL tablet Commonly known as:  NITROSTAT Place 0.4 mg under the tongue every 5 (five) minutes as needed for chest pain. If pain is unrelived after 3 doses, call MD   nystatin cream Commonly known as:  MYCOSTATIN Apply 1 application topically 2 (two) times daily.   OLANZapine 5 MG tablet Commonly known as:  ZYPREXA Take 5 mg by mouth daily.   oxybutynin 5 MG tablet Commonly known as:  DITROPAN Take 5 mg by mouth 2 (two) times daily.   OXYGEN Inhale into the lungs. Titrate 2-6 LPM to keep O2 stats > 93%   pantoprazole 40 MG tablet Commonly known as:  PROTONIX Take 40 mg by mouth daily before breakfast. 1 hour before breakfast   polyethylene glycol packet Commonly known as:  MIRALAX / GLYCOLAX Take 17 g by mouth daily.    potassium chloride SA 20 MEQ tablet Commonly known as:  K-DUR,KLOR-CON Take 20 mEq by mouth daily. Reported on 04/04/2015   prochlorperazine 5 MG tablet Commonly known as:  COMPAZINE Take 5 mg by mouth every 6 (six) hours as needed for nausea or vomiting.   senna 8.6 MG tablet Commonly known as:  SENOKOT Take 1 tablet by mouth at bedtime.   UNABLE TO FIND Med Name: Med pass 60 mL by mouth 2 times daily   VITAMIN D-3 PO Take 1,000  Units by mouth daily.       Review of Systems  Constitutional: Negative for activity change, appetite change, chills, fatigue and fever.  HENT: Negative for rhinorrhea, sinus pressure, sneezing and sore throat.   Eyes: Negative.   Respiratory: Negative for chest tightness and stridor.        Oxygen via nasal cannula.  Cardiovascular: Negative for chest pain, palpitations and leg swelling.  Gastrointestinal: Negative for abdominal distention, abdominal pain, constipation, diarrhea, nausea and vomiting.  Endocrine: Negative for cold intolerance, heat intolerance, polydipsia, polyphagia and polyuria.  Genitourinary: Negative for dysuria, frequency and urgency.  Musculoskeletal: Positive for gait problem.  Skin: Negative for color change, pallor and rash.  Neurological: Negative for dizziness, seizures, syncope, light-headedness and headaches.  Hematological: Does not bruise/bleed easily.  Psychiatric/Behavioral: Negative for agitation, confusion, hallucinations and sleep disturbance.    Immunization History  Administered Date(s) Administered  . Influenza Split 11/20/2010  . Influenza,inj,Quad PF,36+ Mos 01/02/2013  . Influenza-Unspecified 11/29/2015  . PPD Test 03/23/2015, 04/19/2015  . Pneumococcal Polysaccharide-23 02/23/2011   Pertinent  Health Maintenance Due  Topic Date Due  . DEXA SCAN  07/12/1994  . PNA vac Low Risk Adult (2 of 2 - PCV13) 02/23/2012  . INFLUENZA VACCINE  Completed      Vitals:   03/05/16 1000  BP: 140/81  Pulse:  68  Resp: 18  Temp: 98.3 F (36.8 C)  SpO2: 97%  Weight: 209 lb (94.8 kg)  Height: 5\' 10"  (1.778 m)   Body mass index is 29.99 kg/m. Physical Exam  Constitutional: She is oriented to person, place, and time. She appears well-nourished. No distress.  obese  HENT:  Head: Normocephalic.  Mouth/Throat: Oropharynx is clear and moist.  Eyes: Conjunctivae and EOM are normal. Pupils are equal, round, and reactive to light. Right eye exhibits no discharge. Left eye exhibits no discharge. No scleral icterus.  Neck: Normal range of motion. No JVD present.  Cardiovascular: Normal rate, regular rhythm, normal heart sounds and intact distal pulses.  Exam reveals no gallop and no friction rub.   No murmur heard. Pulmonary/Chest: Effort normal and breath sounds normal. No respiratory distress. She has no wheezes. She has no rales.   Oxygen 2 liters via [place] in place   Abdominal: Soft. Bowel sounds are normal. She exhibits no distension. There is no tenderness. There is no rebound and no guarding.  Genitourinary:  Genitourinary Comments: Incontinent B/B   Musculoskeletal: She exhibits no edema, tenderness or deformity.  Unsteady gait. Bilateral UE limited ROM due to pain and popping with ROM.   Lymphadenopathy:    She has no cervical adenopathy.  Neurological: She is oriented to person, place, and time.  Skin: Skin is warm and dry. No rash noted. No erythema. No pallor.  Psychiatric: She has a normal mood and affect.    Labs reviewed:  Recent Labs  03/30/15 0445 03/31/15 0402 04/01/15 0417 04/05/15 04/12/15 07/11/15  NA 140 140 140 143 140 143  K 4.3 4.2 4.1 4.4 4.5 4.7  CL 105 105 105  --   --   --   CO2 27 27 26   --   --   --   GLUCOSE 95 98 105*  --   --   --   BUN 36* 34* 30* 11 16 16   CREATININE 0.98 0.98 0.98 0.8 1.2* 0.9  CALCIUM 8.4* 8.5* 8.6*  --   --   --     Recent Labs  03/29/15 1226 03/30/15 0445  04/01/15 0417  AST 24 19 22   ALT 33 28 23  ALKPHOS 51 47 55  BILITOT  0.8 0.7 0.9  PROT 5.7* 5.1* 5.4*  ALBUMIN 2.9* 2.7* 2.8*    Recent Labs  03/28/15 0517 03/29/15 1226 03/30/15 0445 03/31/15 0402 04/01/15 0417 04/05/15 04/12/15 07/11/15  WBC 16.4* 16.9* 16.1* 15.5* 13.9* 11.2 7.9 11.0  NEUTROABS 11.1* 12.4* 11.0*  --   --   --   --   --   HGB 9.0* 8.9* 8.3* 8.6* 9.0* 8.6* 9.5* 11.6*  HCT 28.5* 28.2* 25.4* 27.5* 29.4* 30* 32* 39  MCV 97.6 97.9 95.5 98.9 99.7  --   --   --   PLT 399 429* 366 421* 403* 404* 361 397   Lab Results  Component Value Date   TSH 3.87 01/16/2016   Lab Results  Component Value Date   HGBA1C 5.6 07/11/2015   Lab Results  Component Value Date   CHOL 151 01/16/2016   HDL 37 01/16/2016   LDLCALC 87 01/16/2016   TRIG 131 01/16/2016   Assessment/Plan 1. Hypertensive heart disease with CHF (congestive heart failure) (HCC) B/p stable. Continue on Amlodipine and coreg. Monitor BMP  2. Chronic diastolic CHF (congestive heart failure) (HCC) Stable. No recent weight changes. Exam findings negative. Continue on Coreg and Furosemide. Continue on oxygen PRN.  3. Hypothyroidism, unspecified type Continue on levothyroxine 88 mcg tablet. Monitor TSH level.     Family/ staff Communication: Reviewed plan of care with patient and facility Nurse supervisor.   Labs/tests ordered:  None

## 2016-03-21 ENCOUNTER — Non-Acute Institutional Stay (SKILLED_NURSING_FACILITY): Payer: Medicare Other | Admitting: Family

## 2016-03-21 DIAGNOSIS — F329 Major depressive disorder, single episode, unspecified: Secondary | ICD-10-CM | POA: Diagnosis not present

## 2016-03-21 DIAGNOSIS — F411 Generalized anxiety disorder: Secondary | ICD-10-CM | POA: Diagnosis not present

## 2016-03-21 DIAGNOSIS — F4320 Adjustment disorder, unspecified: Secondary | ICD-10-CM | POA: Diagnosis not present

## 2016-03-21 DIAGNOSIS — F4321 Adjustment disorder with depressed mood: Secondary | ICD-10-CM

## 2016-03-21 NOTE — Progress Notes (Signed)
Location:  [facility] Room Number: 503 A  Place of Service:  SNF (31) Provider:  Montel Vanderhoof FNP-C   [name], MD  Patient Care Team: [name], MD as PCP - General (Internal Medicine)  Extended Emergency Contact Information Primary Emergency Contact: Cypert,Leroy Address: [address]          [place], [place] [address] [place] Home Phone: [phone] Mobile Phone: [phone] Relation: Spouse  Code Status:  DNR  Goals of care: Advanced Directive information Advanced Directives 01/31/2016  Does Patient Have a Medical Advance Directive? Yes  Type of Advance Directive Out of facility DNR (pink MOST or yellow form)  Does patient want to make changes to medical advance directive? -  Copy of Healthcare Power of Attorney in Chart? -  Would patient like information on creating a medical advance directive? -  Pre-existing out of facility DNR order (yellow form or pink MOST form) Yellow form placed in chart (order not valid for inpatient use)     Chief Complaint  Patient presents with  . Acute Visit    " Crying"     HPI:  Pt is a 83 y.o. female seen today at [facility] for medical management of chronic diseases. She has a medical history of HTN, CHF, Hypothyroidism, CKD stage 3, Hyperlipidemia,Depression, GAD, Fibromyalgia among other conditions. She is seen in her room today per facility nurse request. Nurse reports patient has been crying constantly due to recent death of the husband.Patient states misses the husband so much.She sobs in the middle of her sentence.She does not remember eating her breakfast this morning but states would like to drink some cold milk and Juice. Nurse notified.Patient stated felt better after talking with provider. She denies any suicide ideation.   Past Medical History:  Diagnosis Date  . Anxiety   . Bronchitis 02/22/2011  . CHF (congestive heart failure) (HCC)    felt to have diastolic dysfunction with normal EF at 60%  . Chronic pain   . CKD (chronic kidney disease) stage 4, GFR 15-29 ml/min (HCC)   . Depression   . DOE (dyspnea on exertion)    chronic   . Falls   . Fibromyalgia    with chronic weakness  . GERD (gastroesophageal reflux disease)   . Glaucoma   . Hyperlipidemia   . Hypertension   . Hypothyroidism   . Osteoarthritis   . PAF (paroxysmal atrial fibrillation) (HCC)    not felt to be a candidate for coumadin  . Physical deconditioning    Past Surgical History:  Procedure Laterality Date  . APPENDECTOMY     as a teenage  . CESAREAN SECTION    . CHOLECYSTECTOMY     pt denies having cholecystectomy  . FEMUR IM NAIL Right 03/23/2015   Procedure: INTRAMEDULLARY (IM) NAIL FEMORAL;  Surgeon: Durene Romans, MD;  Location: [facility];  Service: Orthopedics;  Laterality: Right;  . HEMORRHOID SURGERY    . KNEE SURGERY     bilateral  . LAPAROSCOPIC HYSTERECTOMY    . TRANSTHORACIC ECHOCARDIOGRAM  02/2011   EF 60% with diastolic dysfunction, high filling pressures    Allergies  Allergen Reactions  . Penicillins Swelling and Rash    Allergies as of 03/21/2016      Reactions   Penicillins Swelling, Rash      Medication List       Accurate as of 03/21/16 10:53 PM. Always use your most recent med  list.          acetaminophen 500 MG tablet Commonly known as:  TYLENOL Take 500 mg by mouth 2 (two) times daily.   albuterol (2.5 MG/3ML) 0.083% nebulizer solution Commonly known as:  PROVENTIL Take 3 mLs (2.5 mg total) by nebulization every 4 (four) hours as needed for wheezing or shortness of breath.   amLODipine 2.5 MG tablet Commonly known as:  NORVASC Take 2.5 mg by mouth daily.   aspirin 325 MG EC tablet Take 325 mg by mouth daily.   atorvastatin 20 MG tablet Commonly known as:  LIPITOR Take 20 mg by mouth daily.   baclofen 10 MG tablet Commonly known as:  LIORESAL Take 10 mg by mouth at bedtime. Tablet at bedtime and  every 8 hrs as needed   bisacodyl 5 MG EC tablet Commonly known as:  DULCOLAX Take 1 tablet (5 mg total) by mouth daily as needed for moderate constipation.   carvedilol 25 MG tablet Commonly known as:  COREG Take 25 mg by mouth 2 (two) times daily with a meal. Hold if sbp < 110   CERTAVITE SENIOR/ANTIOXIDANT Tabs Take 1 tablet by mouth daily.   dorzolamide 2 % ophthalmic solution Commonly known as:  TRUSOPT Place 1 drop into both eyes 2 (two) times daily.   ferrous sulfate 325 (65 FE) MG tablet Take 325 mg by mouth daily.   FLUoxetine HCl 60 MG Tabs Take 60 mg by mouth daily.   furosemide 40 MG tablet Commonly known as:  LASIX Take 40 mg by mouth daily. Hold if sbp < 110   guaiFENesin 600 MG 12 hr tablet Commonly known as:  MUCINEX Take 600 mg by mouth 2 (two) times daily.   HYDROcodone-acetaminophen 5-325 MG tablet Commonly known as:  NORCO/VICODIN Take one tablet by mouth every 8 hours as needed for severe pain. Do not exceed 4gm of Tylenol in 24 hours   levothyroxine 88 MCG tablet Commonly known as:  SYNTHROID Take 1 tablet (88 mcg total) by mouth daily before breakfast.   LORazepam 1 MG tablet Commonly known as:  ATIVAN Take 1 tablet (1 mg total) by mouth 2 (two) times daily. Take 1 additional by mouth daily as needed in the afternoon for severe anxiety   magnesium oxide 400 MG tablet Commonly known as:  MAG-OX Take 1,600 mg by mouth daily.   nitroGLYCERIN 0.4 MG SL tablet Commonly known as:  NITROSTAT Place 0.4 mg under the tongue every 5 (five) minutes as needed for chest pain. If pain is unrelived after 3 doses, call MD   nystatin cream Commonly known as:  MYCOSTATIN Apply 1 application topically 2 (two) times daily.   OLANZapine 5 MG tablet Commonly known as:  ZYPREXA Take 5 mg by mouth daily.   oxybutynin 5 MG tablet Commonly known as:  DITROPAN Take 5 mg by mouth 2 (two) times daily.   OXYGEN Inhale into the lungs. Titrate 2-6 LPM to keep O2  stats > 93%   pantoprazole 40 MG tablet Commonly known as:  PROTONIX Take 40 mg by mouth daily before breakfast. 1 hour before breakfast   polyethylene glycol packet Commonly known as:  MIRALAX / GLYCOLAX Take 17 g by mouth daily.   potassium chloride SA 20 MEQ tablet Commonly known as:  K-DUR,KLOR-CON Take 20 mEq by mouth daily. Reported on 04/04/2015   prochlorperazine 5 MG tablet Commonly known as:  COMPAZINE Take 5 mg by mouth every 6 (six) hours as needed for nausea or  vomiting.   senna 8.6 MG tablet Commonly known as:  SENOKOT Take 1 tablet by mouth at bedtime.   UNABLE TO FIND Med Name: Med pass 60 mL by mouth 2 times daily   VITAMIN D-3 PO Take 1,000 Units by mouth daily.       Review of Systems  Constitutional: Negative for activity change, appetite change, chills, fatigue and fever.  HENT: Negative for congestion, rhinorrhea, sinus pressure, sneezing and sore throat.   Eyes: Negative.   Respiratory: Negative for chest tightness and stridor.        Oxygen via nasal cannula.  Cardiovascular: Negative for chest pain, palpitations and leg swelling.  Gastrointestinal: Negative for abdominal distention, abdominal pain, constipation, diarrhea, nausea and vomiting.  Genitourinary: Negative for dysuria, frequency and urgency.  Musculoskeletal: Positive for gait problem.  Skin: Negative for color change, pallor and rash.  Neurological: Negative for dizziness, seizures, syncope, light-headedness and headaches.  Psychiatric/Behavioral: Negative for agitation, confusion, hallucinations and sleep disturbance.    Immunization History  Administered Date(s) Administered  . Influenza Split 11/20/2010  . Influenza,inj,Quad PF,36+ Mos 01/02/2013  . Influenza-Unspecified 11/29/2015  . PPD Test 03/23/2015, 04/19/2015  . Pneumococcal Polysaccharide-23 02/23/2011   Pertinent  Health Maintenance Due  Topic Date Due  . DEXA SCAN  07/12/1994  . PNA vac Low Risk Adult (2 of 2 -  PCV13) 02/23/2012  . INFLUENZA VACCINE  Completed      Vitals:   03/21/16 1200  BP: 137/77  Pulse: 74  Resp: 18  Temp: 98 F (36.7 C)  SpO2: 96%  Weight: 209 lb (94.8 kg)  Height: 5\' 10"  (1.778 m)   Body mass index is 29.99 kg/m. Physical Exam  Constitutional: She is oriented to person, place, and time. She appears well-nourished. No distress.  obese  HENT:  Head: Normocephalic.  Mouth/Throat: Oropharynx is clear and moist.  Eyes: Conjunctivae and EOM are normal. Pupils are equal, round, and reactive to light. Right eye exhibits no discharge. Left eye exhibits no discharge. No scleral icterus.  Neck: Normal range of motion. No JVD present.  Cardiovascular: Normal rate, regular rhythm, normal heart sounds and intact distal pulses.  Exam reveals no gallop and no friction rub.   No murmur heard. Pulmonary/Chest: Effort normal and breath sounds normal. No respiratory distress. She has no wheezes. She has no rales.   Oxygen 2 liters via [place] in place   Abdominal: Soft. Bowel sounds are normal. She exhibits no distension. There is no tenderness. There is no rebound and no guarding.  Genitourinary:  Genitourinary Comments: Incontinent B/B   Musculoskeletal: She exhibits no edema, tenderness or deformity.  Unsteady gait. Bilateral UE limited ROM due to pain and popping with ROM.   Lymphadenopathy:    She has no cervical adenopathy.  Neurological: She is oriented to person, place, and time.  Skin: Skin is warm and dry. No rash noted. No erythema. No pallor.  Psychiatric: She has a normal mood and affect.    Labs reviewed:  Recent Labs  03/30/15 0445 03/31/15 0402 04/01/15 0417 04/05/15 04/12/15 07/11/15  NA 140 140 140 143 140 143  K 4.3 4.2 4.1 4.4 4.5 4.7  CL 105 105 105  --   --   --   CO2 27 27 26   --   --   --   GLUCOSE 95 98 105*  --   --   --   BUN 36* 34* 30* 11 16 16   CREATININE 0.98 0.98 0.98 0.8 1.2*  0.9  CALCIUM 8.4* 8.5* 8.6*  --   --   --     Recent  Labs  03/29/15 1226 03/30/15 0445 04/01/15 0417  AST 24 19 22   ALT 33 28 23  ALKPHOS 51 47 55  BILITOT 0.8 0.7 0.9  PROT 5.7* 5.1* 5.4*  ALBUMIN 2.9* 2.7* 2.8*    Recent Labs  03/28/15 0517 03/29/15 1226 03/30/15 0445 03/31/15 0402 04/01/15 0417 04/05/15 04/12/15 07/11/15  WBC 16.4* 16.9* 16.1* 15.5* 13.9* 11.2 7.9 11.0  NEUTROABS 11.1* 12.4* 11.0*  --   --   --   --   --   HGB 9.0* 8.9* 8.3* 8.6* 9.0* 8.6* 9.5* 11.6*  HCT 28.5* 28.2* 25.4* 27.5* 29.4* 30* 32* 39  MCV 97.6 97.9 95.5 98.9 99.7  --   --   --   PLT 399 429* 366 421* 403* 404* 361 397   Lab Results  Component Value Date   TSH 3.87 01/16/2016   Lab Results  Component Value Date   HGBA1C 5.6 07/11/2015   Lab Results  Component Value Date   CHOL 151 01/16/2016   HDL 37 01/16/2016   LDLCALC 87 01/16/2016   TRIG 131 01/16/2016   Assessment/Plan  Major Depression  Recent loss of husband.Denies any suicidal ideation.will continue on Fluoxetine 60 mg Tablet daily. Psych consult.   GAD Continue on alprazolam 1 mg Tablet twice daily. Continue to monitor.   Grief  Recent loss of husband. Crying during visit but stop and reminisces about the husband.grief process discussed with patient okay to cry. Consult Psych to monitor.     Family/ staff Communication: Reviewed plan of care with patient and facility Nurse supervisor.   Labs/tests ordered:  None

## 2016-04-04 ENCOUNTER — Non-Acute Institutional Stay (SKILLED_NURSING_FACILITY): Payer: Medicare Other | Admitting: Family

## 2016-04-04 DIAGNOSIS — M25511 Pain in right shoulder: Secondary | ICD-10-CM | POA: Diagnosis not present

## 2016-04-04 DIAGNOSIS — G8929 Other chronic pain: Secondary | ICD-10-CM

## 2016-04-04 DIAGNOSIS — M25512 Pain in left shoulder: Secondary | ICD-10-CM | POA: Diagnosis not present

## 2016-04-04 DIAGNOSIS — E039 Hypothyroidism, unspecified: Secondary | ICD-10-CM | POA: Diagnosis not present

## 2016-04-04 DIAGNOSIS — R05 Cough: Secondary | ICD-10-CM

## 2016-04-04 DIAGNOSIS — I5032 Chronic diastolic (congestive) heart failure: Secondary | ICD-10-CM | POA: Diagnosis not present

## 2016-04-04 DIAGNOSIS — I110 Hypertensive heart disease with heart failure: Secondary | ICD-10-CM

## 2016-04-04 DIAGNOSIS — R059 Cough, unspecified: Secondary | ICD-10-CM

## 2016-04-04 MED ORDER — IPRATROPIUM-ALBUTEROL 0.5-2.5 (3) MG/3ML IN SOLN: 3.0000 mL | Freq: Four times a day (QID) | RESPIRATORY_TRACT | Status: DC

## 2016-04-04 NOTE — Progress Notes (Signed)
Location:  [facility] Room Number: 502 B  Place of Service:   SNF ( 31) Provider:  Ladarrion Telfair FNP-C   [name], MD  Patient Care Team: [name], MD as PCP - General (Internal Medicine)  Extended Emergency Contact Information Primary Emergency Contact: Marse,Leroy Address: [address]          [place], [place] [address] [place] Home Phone: [phone] Mobile Phone: [phone] Relation: Spouse  Code Status:  DNR  Goals of care: Advanced Directive information Advanced Directives 01/31/2016  Does Patient Have a Medical Advance Directive? Yes  Type of Advance Directive Out of facility DNR (pink MOST or yellow form)  Does patient want to make changes to medical advance directive? -  Copy of Healthcare Power of Attorney in Chart? -  Would patient like information on creating a medical advance directive? -  Pre-existing out of facility DNR order (yellow form or pink MOST form) Yellow form placed in chart (order not valid for inpatient use)     Chief Complaint  Patient presents with  . Medical Management of Chronic Issues    HPI:  Pt is a 83 y.o. female seen today at [facility] for medical management of chronic diseases. She has a medical history of HTN, CHF, Hypothyroidism, CKD stage 3, Hyperlipidemia, GAD, Fibromyalgia among other conditions. She is seen in her room today. She states coping well with recent husband's loss.She was seen by Pyschaitry provider 03/27/2016 no change in medication. She states has a hucking cough.Also reports had nausea, vomitting and diarrhea for the past three days. She states N/V has resolved but had loose stool previous night. Deneis any fever or chills. Facility Nurse reports no new concerns.  Past Medical History:  Diagnosis Date  . Anxiety   . Bronchitis 02/22/2011  . CHF (congestive heart failure) (HCC)    felt to have diastolic dysfunction with normal EF at 60%   . Chronic pain   . CKD (chronic kidney disease) stage 4, GFR 15-29 ml/min (HCC)   . Depression   . DOE (dyspnea on exertion)    chronic   . Falls   . Fibromyalgia    with chronic weakness  . GERD (gastroesophageal reflux disease)   . Glaucoma   . Hyperlipidemia   . Hypertension   . Hypothyroidism   . Osteoarthritis   . PAF (paroxysmal atrial fibrillation) (HCC)    not felt to be a candidate for coumadin  . Physical deconditioning    Past Surgical History:  Procedure Laterality Date  . APPENDECTOMY     as a teenage  . CESAREAN SECTION    . CHOLECYSTECTOMY     pt denies having cholecystectomy  . FEMUR IM NAIL Right 03/23/2015   Procedure: INTRAMEDULLARY (IM) NAIL FEMORAL;  Surgeon: Durene Romans, MD;  Location: [facility];  Service: Orthopedics;  Laterality: Right;  . HEMORRHOID SURGERY    . KNEE SURGERY     bilateral  . LAPAROSCOPIC HYSTERECTOMY    . TRANSTHORACIC ECHOCARDIOGRAM  02/2011   EF 60% with diastolic dysfunction, high filling pressures    Allergies  Allergen Reactions  . Penicillins Swelling and Rash    Allergies as of 04/04/2016      Reactions   Penicillins Swelling, Rash      Medication List       Accurate as of 04/04/16  1:25 PM. Always use your most recent med list.  acetaminophen 500 MG tablet Commonly known as:  TYLENOL Take 500 mg by mouth 2 (two) times daily.   albuterol (2.5 MG/3ML) 0.083% nebulizer solution Commonly known as:  PROVENTIL Take 3 mLs (2.5 mg total) by nebulization every 4 (four) hours as needed for wheezing or shortness of breath.   amLODipine 2.5 MG tablet Commonly known as:  NORVASC Take 2.5 mg by mouth daily.   aspirin 325 MG EC tablet Take 325 mg by mouth daily.   atorvastatin 20 MG tablet Commonly known as:  LIPITOR Take 20 mg by mouth daily.   baclofen 10 MG tablet Commonly known as:  LIORESAL Take 10 mg by mouth at bedtime. Tablet at bedtime and every 8 hrs as needed   bisacodyl 5 MG EC  tablet Commonly known as:  DULCOLAX Take 1 tablet (5 mg total) by mouth daily as needed for moderate constipation.   carvedilol 25 MG tablet Commonly known as:  COREG Take 25 mg by mouth 2 (two) times daily with a meal. Hold if sbp < 110   CERTAVITE SENIOR/ANTIOXIDANT Tabs Take 1 tablet by mouth daily.   dorzolamide 2 % ophthalmic solution Commonly known as:  TRUSOPT Place 1 drop into both eyes 2 (two) times daily.   ferrous sulfate 325 (65 FE) MG tablet Take 325 mg by mouth daily.   FLUoxetine HCl 60 MG Tabs Take 60 mg by mouth daily.   furosemide 40 MG tablet Commonly known as:  LASIX Take 40 mg by mouth daily. Hold if sbp < 110   guaiFENesin 600 MG 12 hr tablet Commonly known as:  MUCINEX Take 600 mg by mouth 2 (two) times daily.   HYDROcodone-acetaminophen 5-325 MG tablet Commonly known as:  NORCO/VICODIN Take one tablet by mouth every 8 hours as needed for severe pain. Do not exceed 4gm of Tylenol in 24 hours   levothyroxine 88 MCG tablet Commonly known as:  SYNTHROID Take 1 tablet (88 mcg total) by mouth daily before breakfast.   lidocaine 5 % Commonly known as:  LIDODERM Place 2 patches onto the skin daily. Remove & Discard patch within 12 hours or as directed by MD   LORazepam 1 MG tablet Commonly known as:  ATIVAN Take 1 tablet (1 mg total) by mouth 2 (two) times daily. Take 1 additional by mouth daily as needed in the afternoon for severe anxiety   magnesium oxide 400 MG tablet Commonly known as:  MAG-OX Take 1,600 mg by mouth daily.   nitroGLYCERIN 0.4 MG SL tablet Commonly known as:  NITROSTAT Place 0.4 mg under the tongue every 5 (five) minutes as needed for chest pain. If pain is unrelived after 3 doses, call MD   nystatin cream Commonly known as:  MYCOSTATIN Apply 1 application topically 2 (two) times daily.   OLANZapine 5 MG tablet Commonly known as:  ZYPREXA Take 5 mg by mouth daily.   oxybutynin 5 MG tablet Commonly known as:   DITROPAN Take 5 mg by mouth 2 (two) times daily.   OXYGEN Inhale into the lungs. Titrate 2-6 LPM to keep O2 stats > 93%   pantoprazole 40 MG tablet Commonly known as:  PROTONIX Take 40 mg by mouth daily before breakfast. 1 hour before breakfast   polyethylene glycol packet Commonly known as:  MIRALAX / GLYCOLAX Take 17 g by mouth daily.   potassium chloride SA 20 MEQ tablet Commonly known as:  K-DUR,KLOR-CON Take 20 mEq by mouth daily. Reported on 04/04/2015   prochlorperazine 5 MG tablet  Commonly known as:  COMPAZINE Take 5 mg by mouth every 6 (six) hours as needed for nausea or vomiting.   senna 8.6 MG tablet Commonly known as:  SENOKOT Take 1 tablet by mouth at bedtime.   UNABLE TO FIND Med Name: Med pass 60 mL by mouth 2 times daily   VITAMIN D-3 PO Take 1,000 Units by mouth daily.       Review of Systems  Constitutional: Negative for activity change, appetite change, chills, fatigue and fever.  HENT: Negative for rhinorrhea, sinus pressure, sneezing and sore throat.   Eyes: Negative.   Respiratory: Positive for cough. Negative for chest tightness and stridor.        Oxygen via nasal cannula.  Cardiovascular: Negative for chest pain, palpitations and leg swelling.  Gastrointestinal: Negative for abdominal distention, abdominal pain, constipation, nausea and vomiting.       N/V/D per HPI   Endocrine: Negative for cold intolerance, heat intolerance, polydipsia, polyphagia and polyuria.  Genitourinary: Negative for dysuria, flank pain, frequency and urgency.  Musculoskeletal: Positive for gait problem.       Bilateral shoulder pain   Skin: Negative for color change, pallor and rash.  Neurological: Negative for dizziness, seizures, syncope, light-headedness and headaches.  Hematological: Does not bruise/bleed easily.  Psychiatric/Behavioral: Negative for agitation, confusion, hallucinations and sleep disturbance.    Immunization History  Administered Date(s)  Administered  . Influenza Split 11/20/2010  . Influenza,inj,Quad PF,36+ Mos 01/02/2013  . Influenza-Unspecified 11/29/2015  . PPD Test 03/23/2015, 04/19/2015  . Pneumococcal Polysaccharide-23 02/23/2011   Pertinent  Health Maintenance Due  Topic Date Due  . DEXA SCAN  07/12/1994  . PNA vac Low Risk Adult (2 of 2 - PCV13) 02/23/2012  . INFLUENZA VACCINE  Completed      Vitals:   04/04/16 1000  BP: (!) 142/69  Pulse: 66  Resp: 20  Temp: 97.4 F (36.3 C)  SpO2: 94%  Weight: 197 lb 3.2 oz (89.4 kg)  Height: 5\' 10"  (1.778 m)   Body mass index is 28.3 kg/m. Physical Exam  Constitutional: She is oriented to person, place, and time. She appears well-nourished. No distress.  obese  HENT:  Head: Normocephalic.  Mouth/Throat: Oropharynx is clear and moist.  Eyes: Conjunctivae and EOM are normal. Pupils are equal, round, and reactive to light. Right eye exhibits no discharge. Left eye exhibits no discharge. No scleral icterus.  Neck: Normal range of motion. No JVD present.  Cardiovascular: Normal rate, regular rhythm, normal heart sounds and intact distal pulses.  Exam reveals no gallop and no friction rub.   No murmur heard. Pulmonary/Chest: Effort normal. No respiratory distress. She has wheezes. She has no rales.   Oxygen 2 liters via [place] off during visit oxygen saturation 93 -94 % on RA.    Abdominal: Soft. Bowel sounds are normal. She exhibits no distension. There is no tenderness. There is no rebound and no guarding.  Genitourinary:  Genitourinary Comments: Incontinent B/B   Musculoskeletal: She exhibits no edema, tenderness or deformity.  Unsteady gait. Bilateral UE limited ROM due to pain. Shoulder/elbow crepitus noted with ROM.   Lymphadenopathy:    She has no cervical adenopathy.  Neurological: She is oriented to person, place, and time.  Skin: Skin is warm and dry. No rash noted. No erythema. No pallor.  Psychiatric: She has a normal mood and affect.    Labs  reviewed:  Recent Labs  04/12/15 07/11/15  NA 140 143  K 4.5 4.7  BUN 16  16  CREATININE 1.2* 0.9    Recent Labs  04/12/15 07/11/15  WBC 7.9 11.0  HGB 9.5* 11.6*  HCT 32* 39  PLT 361 397   Lab Results  Component Value Date   TSH 3.87 01/16/2016   Lab Results  Component Value Date   HGBA1C 5.6 07/11/2015   Lab Results  Component Value Date   CHOL 151 01/16/2016   HDL 37 01/16/2016   LDLCALC 87 01/16/2016   TRIG 131 01/16/2016   Assessment/Plan  1. Cough Afebrile.Non-productive.continue with cough syrup as needed.Start Duoneb 0.5-2.5 (3) mg/ 3 ml inhale 3 mls via nebulization every 6 Hours.Portable CXR Pa/Lat rule out PNA   2. Hypertensive heart disease with CHF (congestive heart failure) (HCC) B/p stable. Continue on Amlodipine and coreg. Check BMP 04/05/2016   3. Chronic diastolic CHF (congestive heart failure) (HCC) Appears compensated. Bilateral lung wheezes with non-productive cough noted. Will obtain portable CXR as above r/o PNA. Continue on Coreg and Furosemide. Continue on oxygen PRN.  4. Hypothyroidism, unspecified type Continue on levothyroxine 88 mcg tablet. Check TSH level 04/05/2016.    5. Bilateral Shoulder pain  Bilateral crepitus to shoulders and elbows possible Arthritis. Currently following up with PMR for pain management.   Family/ staff Communication: Reviewed plan of care with patient and facility Nurse supervisor.   Labs/tests ordered:  Portable CXR Pa/Lat rule out PNA. CBC/diff, BMP and TSH level 04/05/2016.

## 2016-04-05 LAB — BASIC METABOLIC PANEL
BUN: 15 mg/dL (ref 4–21)
Creatinine: 0.9 mg/dL (ref 0.5–1.1)
Glucose: 99 mg/dL
Potassium: 4.8 mmol/L (ref 3.4–5.3)
Sodium: 144 mmol/L (ref 137–147)

## 2016-04-05 LAB — TSH: TSH: 3.88 u[IU]/mL (ref 0.41–5.90)

## 2016-04-05 LAB — CBC AND DIFFERENTIAL
HEMATOCRIT: 39 % (ref 36–46)
HEMOGLOBIN: 12.3 g/dL (ref 12.0–16.0)
PLATELETS: 363 10*3/uL (ref 150–399)
WBC: 9.8 10^3/mL

## 2016-04-26 ENCOUNTER — Non-Acute Institutional Stay (SKILLED_NURSING_FACILITY): Payer: Medicare Other | Admitting: Internal Medicine

## 2016-04-26 ENCOUNTER — Encounter: Payer: Self-pay | Admitting: Internal Medicine

## 2016-04-26 DIAGNOSIS — E039 Hypothyroidism, unspecified: Secondary | ICD-10-CM | POA: Diagnosis not present

## 2016-04-26 DIAGNOSIS — F411 Generalized anxiety disorder: Secondary | ICD-10-CM

## 2016-04-26 DIAGNOSIS — I5032 Chronic diastolic (congestive) heart failure: Secondary | ICD-10-CM | POA: Diagnosis not present

## 2016-04-26 DIAGNOSIS — K219 Gastro-esophageal reflux disease without esophagitis: Secondary | ICD-10-CM

## 2016-04-26 DIAGNOSIS — I480 Paroxysmal atrial fibrillation: Secondary | ICD-10-CM

## 2016-04-26 DIAGNOSIS — N3281 Overactive bladder: Secondary | ICD-10-CM

## 2016-04-26 NOTE — Progress Notes (Signed)
Patient ID: Debra GravelFrances R Deckman, female   DOB: [birth year], 83 y.o.   MRN: [id number]    [facility]  PCP: Oneal GroutPANDEY, Sequoya Hogsett, MD   Code Status: DNR  Goals of care: Advanced Directive information Advanced Directives 01/31/2016  Does Patient Have a Medical Advance Directive? Yes  Type of Advance Directive Out of facility DNR (pink MOST or yellow form)  Does patient want to make changes to medical advance directive? -  Copy of Healthcare Power of Attorney in Chart? -  Would patient like information on creating a medical advance directive? -  Pre-existing out of facility DNR order (yellow form or pink MOST form) Yellow form placed in chart (order not valid for inpatient use)     Extended Emergency Contact Information Primary Emergency Contact: Teasdale,Leroy Address: [address]          [place], [place] [id number] [place] Home Phone: [phone] Mobile Phone: [phone] Relation: Spouse   Allergies  Allergen Reactions  . Penicillins Swelling and Rash    Chief Complaint  Patient presents with  . Medical Management of Chronic Issues    Routine Visit      HPI:  Patient is a 83 y.o. female seen today for routine visit. She recently lost her husband and is in grief. Her sleep cycle has been fair. She denies depression and suicidal ideation. She continues to have left shoulder pain. No other complaints this visit.    Review of Systems:  Constitutional: Negative for fever, chills. HENT: Negative for headache, congestion, nasal discharge. Positive for occasionalcough. Eyes: Negative for double vision and discharge.  Respiratory: Negative for wheezing and dyspnea. On o2 Cardiovascular: Negative for chest pain, palpitation.  Gastrointestinal: Negative for heartburn, nausea, vomiting, abdominal pain. She had a bowel movement yesterday.  Genitourinary: negative for dysuria and flank pain Musculoskeletal: Negative for back pain, fall in the facility Skin:  Negative for itching, rash.  Neurological: Negative for dizziness    Past Medical History:  Diagnosis Date  . Anxiety   . Bronchitis 02/22/2011  . CHF (congestive heart failure) (HCC)    felt to have diastolic dysfunction with normal EF at 60%  . Chronic pain   . CKD (chronic kidney disease) stage 4, GFR 15-29 ml/min (HCC)   . Depression   . DOE (dyspnea on exertion)    chronic   . Falls   . Fibromyalgia    with chronic weakness  . GERD (gastroesophageal reflux disease)   . Glaucoma   . Hyperlipidemia   . Hypertension   . Hypothyroidism   . Osteoarthritis   . PAF (paroxysmal atrial fibrillation) (HCC)    not felt to be a candidate for coumadin  . Physical deconditioning    Past Surgical History:  Procedure Laterality Date  . APPENDECTOMY     as a teenage  . CESAREAN SECTION    . CHOLECYSTECTOMY     pt denies having cholecystectomy  . FEMUR IM NAIL Right 03/23/2015   Procedure: INTRAMEDULLARY (IM) NAIL FEMORAL;  Surgeon: Durene RomansMatthew Olin, MD;  Location: [facility];  Service: Orthopedics;  Laterality: Right;  . HEMORRHOID SURGERY    . KNEE SURGERY     bilateral  . LAPAROSCOPIC HYSTERECTOMY    . TRANSTHORACIC ECHOCARDIOGRAM  02/2011   EF 60% with diastolic dysfunction, high filling pressures    Medications: Allergies as of 04/26/2016      Reactions   Penicillins Swelling, Rash      Medication List  Accurate as of 04/26/16  2:39 PM. Always use your most recent med list.          acetaminophen 500 MG tablet Commonly known as:  TYLENOL Take 500 mg by mouth 3 (three) times daily.   albuterol (2.5 MG/3ML) 0.083% nebulizer solution Commonly known as:  PROVENTIL Take 3 mLs (2.5 mg total) by nebulization every 4 (four) hours as needed for wheezing or shortness of breath.   amLODipine 2.5 MG tablet Commonly known as:  NORVASC Take 2.5 mg by mouth daily.   aspirin 325 MG EC tablet Take 325 mg by mouth daily.   atorvastatin 20 MG tablet Commonly known as:   LIPITOR Take 20 mg by mouth daily.   baclofen 10 MG tablet Commonly known as:  LIORESAL Take 10 mg by mouth at bedtime. Tablet at bedtime and every 8 hrs as needed   bisacodyl 5 MG EC tablet Commonly known as:  DULCOLAX Take 1 tablet (5 mg total) by mouth daily as needed for moderate constipation.   carvedilol 25 MG tablet Commonly known as:  COREG Take 25 mg by mouth 2 (two) times daily with a meal. Hold if sbp < 110   CERTAVITE SENIOR/ANTIOXIDANT Tabs Take 1 tablet by mouth daily.   diclofenac sodium 1 % Gel Commonly known as:  VOLTAREN Apply 2 g topically 4 (four) times daily.   dorzolamide 2 % ophthalmic solution Commonly known as:  TRUSOPT Place 1 drop into both eyes 2 (two) times daily.   ferrous sulfate 325 (65 FE) MG tablet Take 325 mg by mouth daily.   FLUoxetine HCl 60 MG Tabs Take 60 mg by mouth daily.   furosemide 40 MG tablet Commonly known as:  LASIX Take 40 mg by mouth daily. Hold if sbp < 110   guaiFENesin 600 MG 12 hr tablet Commonly known as:  MUCINEX Take 600 mg by mouth 2 (two) times daily.   HYDROcodone-acetaminophen 5-325 MG tablet Commonly known as:  NORCO/VICODIN Take 1 tablet by mouth 3 (three) times daily. Hold if sleepy or confused   levothyroxine 88 MCG tablet Commonly known as:  SYNTHROID Take 1 tablet (88 mcg total) by mouth daily before breakfast.   lidocaine 5 % Commonly known as:  LIDODERM Place 2 patches onto the skin daily. Remove & Discard patch within 12 hours or as directed by MD   LORazepam 1 MG tablet Commonly known as:  ATIVAN Take 1 mg by mouth 2 (two) times daily.   magnesium oxide 400 MG tablet Commonly known as:  MAG-OX Take 1,600 mg by mouth daily.   nitroGLYCERIN 0.4 MG SL tablet Commonly known as:  NITROSTAT Place 0.4 mg under the tongue every 5 (five) minutes as needed for chest pain. If pain is unrelived after 3 doses, call MD   OLANZapine 5 MG tablet Commonly known as:  ZYPREXA Take 5 mg by mouth  daily.   oxybutynin 5 MG tablet Commonly known as:  DITROPAN Take 5 mg by mouth 2 (two) times daily.   OXYGEN Inhale into the lungs. Titrate 2-6 LPM to keep O2 stats > 93%   pantoprazole 40 MG tablet Commonly known as:  PROTONIX Take 40 mg by mouth daily before breakfast. 1 hour before breakfast   polyethylene glycol packet Commonly known as:  MIRALAX / GLYCOLAX Take 17 g by mouth daily.   potassium chloride SA 20 MEQ tablet Commonly known as:  K-DUR,KLOR-CON Take 20 mEq by mouth daily. Reported on 04/04/2015   prochlorperazine 5 MG  tablet Commonly known as:  COMPAZINE Take 5 mg by mouth every 6 (six) hours as needed for nausea or vomiting.   senna 8.6 MG tablet Commonly known as:  SENOKOT Take 2 tablets by mouth at bedtime.   UNABLE TO FIND Med Name: Med pass 60 mL by mouth 2 times daily   VITAMIN D-3 PO Take 1,000 Units by mouth daily.       Immunizations: Immunization History  Administered Date(s) Administered  . Influenza Split 11/20/2010  . Influenza,inj,Quad PF,36+ Mos 01/02/2013  . Influenza-Unspecified 11/29/2015  . PPD Test 03/23/2015, 04/19/2015  . Pneumococcal Polysaccharide-23 02/23/2011     Physical Exam: Vitals:   04/26/16 1422  BP: (!) 141/49  Pulse: 96  Resp: 20  Temp: 98.4 F (36.9 C)  TempSrc: Oral  SpO2: 94%  Weight: 207 lb 1.6 oz (93.9 kg)  Height: 5\' 10"  (1.778 m)   Body mass index is 29.72 kg/m.  General- elderly female, Overweight, in no acute distress Head- normocephalic, atraumatic Nose- no nasal discharge Throat- moist mucus membrane Eyes- PERRLA, EOMI, no pallor, no icterus, no discharge Neck- no cervical lymphadenopathy Cardiovascular- normal s1,s2, no murmur, no leg edema Respiratory- CTAB, no wheeze, no rhonchi, no crackles, no use of accessory muscles, on o2 Abdomen- bowel sounds present, soft, non tender Musculoskeletal- able to move all 4 extremities, generalized weakness, limited bilateral shoulder ROM left >  right Neurological- alert and oriented to person, place and time Skin- warm and dry Psychiatry- normal mood and affect this visit    Labs reviewed: Basic Metabolic Panel:  Recent Labs  40/98/11 04/05/16  NA 143 144  K 4.7 4.8  BUN 16 15  CREATININE 0.9 0.9   CBC:  Recent Labs  07/11/15 04/05/16  WBC 11.0 9.8  HGB 11.6* 12.3  HCT 39 39  PLT 397 363     Assessment/Plan  OAB Continue oxybutynin 5 mg twice a day and provide perineal care  Hypothyroidism Lab Results  Component Value Date   TSH 3.88 04/05/2016   Stable. Continue levothyroxine 88 g daily at present.   gerd Stable, decrease pantoprazole to 20 mg daily and monitor her symptom  Chronic diastolic congestive heart failure Euvolemic this visit. Continue furosemide 40 mg daily and carvedilol 25 mg bid. Continue potassium supplement. Monitor BMP  afib Controlled heart rate. Continue carvedilol and aspirin  GAD Continue ativan bid with her olanzapine and fluoxetine. Followed by psych services    [name], MD Internal Medicine [facility] [address] [place], [place] [address] Cell Phone (Monday-Friday 8 am - 5 pm): [phone] On Call: [phone] and follow prompts after 5 pm and on weekends Office Phone: [phone] Office Fax: [phone]

## 2016-04-27 LAB — LIPID PANEL
Cholesterol: 133 mg/dL (ref 0–200)
HDL: 34 mg/dL — AB (ref 35–70)
LDL Cholesterol: 62 mg/dL
Triglycerides: 187 mg/dL — AB (ref 40–160)

## 2016-05-29 ENCOUNTER — Non-Acute Institutional Stay (SKILLED_NURSING_FACILITY): Payer: Medicare Other | Admitting: Family

## 2016-05-29 ENCOUNTER — Encounter: Payer: Self-pay | Admitting: Family

## 2016-05-29 DIAGNOSIS — G8929 Other chronic pain: Secondary | ICD-10-CM | POA: Diagnosis not present

## 2016-05-29 DIAGNOSIS — M25511 Pain in right shoulder: Secondary | ICD-10-CM | POA: Diagnosis not present

## 2016-05-29 DIAGNOSIS — M25512 Pain in left shoulder: Secondary | ICD-10-CM

## 2016-05-29 DIAGNOSIS — I110 Hypertensive heart disease with heart failure: Secondary | ICD-10-CM

## 2016-05-29 DIAGNOSIS — F329 Major depressive disorder, single episode, unspecified: Secondary | ICD-10-CM

## 2016-05-30 NOTE — Progress Notes (Signed)
Location:  [facility] Room Number: 503 [id number] Place of Service:  SNF (31) Provider: Dinah Ngetich FNP-C   [name], MD  Patient Care Team: [name], MD as PCP - General (Internal Medicine)  Extended Emergency Contact Information Primary Emergency Contact: Pauls,Leroy Address: [address]          [place], [place] [address] [place] Home Phone: [phone] Mobile Phone: [phone] Relation: Spouse  Code Status:  DNR  Goals of care: Advanced Directive information Advanced Directives 05/29/2016  Does Patient Have a Medical Advance Directive? Yes  Type of Advance Directive Out of facility DNR (pink MOST or yellow form)  Does patient want to make changes to medical advance directive? No - Patient declined  Copy of Healthcare Power of Attorney in Chart? -  Would patient like information on creating a medical advance directive? -  Pre-existing out of facility DNR order (yellow form or pink MOST form) Yellow form placed in chart (order not valid for inpatient use)     Chief Complaint  Patient presents with  . Medical Management of Chronic Issues    Routine Visit    HPI:  Pt is a 83 y.o. female seen today [facility] for medical management of chronic diseases.She has a medical history of HTN, CHF, hypothyroidism, fibromyalgia, CKD stage 3, Hyperlipidemia, Depression among other conditions. She is seen in her room today. She continues to complain of bilateral shoulder pain. She states had steroid injection with ortho but did not relief the pain. She continues to spend most time in the bed. She is tearful this visit states still thinking of her late husband. Facility Nurse reports no new concerns.       Past Medical History:  Diagnosis Date  . Anxiety   . Bronchitis 02/22/2011  . CHF (congestive heart failure) (HCC)    felt to have diastolic dysfunction with normal EF at 60%  . Chronic pain   . CKD  (chronic kidney disease) stage 4, GFR 15-29 ml/min (HCC)   . Depression   . DOE (dyspnea on exertion)    chronic   . Falls   . Fibromyalgia    with chronic weakness  . GERD (gastroesophageal reflux disease)   . Glaucoma   . Hyperlipidemia   . Hypertension   . Hypothyroidism   . Osteoarthritis   . PAF (paroxysmal atrial fibrillation) (HCC)    not felt to be a candidate for coumadin  . Physical deconditioning    Past Surgical History:  Procedure Laterality Date  . APPENDECTOMY     as a teenage  . CESAREAN SECTION    . CHOLECYSTECTOMY     pt denies having cholecystectomy  . FEMUR IM NAIL Right 03/23/2015   Procedure: INTRAMEDULLARY (IM) NAIL FEMORAL;  Surgeon: Durene Romans, MD;  Location: [facility];  Service: Orthopedics;  Laterality: Right;  . HEMORRHOID SURGERY    . KNEE SURGERY     bilateral  . LAPAROSCOPIC HYSTERECTOMY    . TRANSTHORACIC ECHOCARDIOGRAM  02/2011   EF 60% with diastolic dysfunction, high filling pressures    Allergies  Allergen Reactions  . Penicillins Swelling and Rash    Allergies as of 05/29/2016      Reactions   Penicillins Swelling, Rash      Medication List       Accurate as of 05/29/16 11:59 PM. Always use your most recent med list.          acetaminophen  500 MG tablet Commonly known as:  TYLENOL Take 500 mg by mouth 4 (four) times daily.   albuterol (2.5 MG/3ML) 0.083% nebulizer solution Commonly known as:  PROVENTIL Take 3 mLs (2.5 mg total) by nebulization every 4 (four) hours as needed for wheezing or shortness of breath.   amLODipine 2.5 MG tablet Commonly known as:  NORVASC Take 2.5 mg by mouth daily.   aspirin 325 MG EC tablet Take 325 mg by mouth daily.   atorvastatin 20 MG tablet Commonly known as:  LIPITOR Take 20 mg by mouth daily.   baclofen 10 MG tablet Commonly known as:  LIORESAL Take 10 mg by mouth at bedtime. Tablet at bedtime and every 8 hrs as needed   bisacodyl 5 MG EC tablet Commonly known as:   DULCOLAX Take 1 tablet (5 mg total) by mouth daily as needed for moderate constipation.   carvedilol 25 MG tablet Commonly known as:  COREG Take 25 mg by mouth 2 (two) times daily with a meal. Hold if sbp < 110   CERTAVITE SENIOR/ANTIOXIDANT Tabs Take 1 tablet by mouth daily.   diclofenac sodium 1 % Gel Commonly known as:  VOLTAREN Apply 2 g topically 4 (four) times daily.   docusate sodium 100 MG capsule Commonly known as:  COLACE Take 100 mg by mouth 2 (two) times daily.   dorzolamide 2 % ophthalmic solution Commonly known as:  TRUSOPT Place 1 drop into both eyes 2 (two) times daily.   ferrous sulfate 325 (65 FE) MG tablet Take 325 mg by mouth daily.   FLUoxetine HCl 60 MG Tabs Take 60 mg by mouth daily.   furosemide 40 MG tablet Commonly known as:  LASIX Take 40 mg by mouth daily. Hold if sbp < 110   guaiFENesin 600 MG 12 hr tablet Commonly known as:  MUCINEX Take 600 mg by mouth 2 (two) times daily.   HYDROcodone-acetaminophen 5-325 MG tablet Commonly known as:  NORCO/VICODIN Take 1 tablet by mouth 3 (three) times daily. Hold if sleepy or confused   levothyroxine 88 MCG tablet Commonly known as:  SYNTHROID Take 1 tablet (88 mcg total) by mouth daily before breakfast.   lidocaine 5 % Commonly known as:  LIDODERM Place 2 patches onto the skin daily. Remove & Discard patch within 12 hours or as directed by MD   LORazepam 1 MG tablet Commonly known as:  ATIVAN Take 1 mg by mouth every morning.   LORazepam 0.5 MG tablet Commonly known as:  ATIVAN Take 0.75 mg by mouth at bedtime.   magnesium oxide 400 MG tablet Commonly known as:  MAG-OX Take 1,600 mg by mouth daily.   nitroGLYCERIN 0.4 MG SL tablet Commonly known as:  NITROSTAT Place 0.4 mg under the tongue every 5 (five) minutes as needed for chest pain. If pain is unrelived after 3 doses, call MD   OLANZapine 5 MG tablet Commonly known as:  ZYPREXA Take 5 mg by mouth daily.   oxybutynin 5 MG  tablet Commonly known as:  DITROPAN Take 5 mg by mouth 2 (two) times daily.   OXYGEN Inhale into the lungs. Titrate 2-6 LPM to keep O2 stats > 93%   pantoprazole 20 MG tablet Commonly known as:  PROTONIX Take 20 mg by mouth daily before breakfast. 1 hour before breakfast   polyethylene glycol packet Commonly known as:  MIRALAX / GLYCOLAX Take 17 g by mouth daily.   potassium chloride SA 20 MEQ tablet Commonly known as:  K-DUR,KLOR-CON Take  20 mEq by mouth daily. Reported on 04/04/2015   saccharomyces boulardii 250 MG capsule Commonly known as:  FLORASTOR Take 250 mg by mouth 2 (two) times daily.   senna 8.6 MG tablet Commonly known as:  SENOKOT Take 2 tablets by mouth at bedtime.   UNABLE TO FIND Med Name: Med pass 60 mL by mouth 2 times daily   VITAMIN D-3 PO Take 1,000 Units by mouth daily.       Review of Systems  Constitutional: Negative for activity change, appetite change, chills, fatigue and fever.  HENT: Negative for congestion, rhinorrhea, sinus pressure, sneezing and sore throat.   Eyes: Negative.   Respiratory: Negative for cough, chest tightness and stridor.        Oxygen via nasal cannula.  Cardiovascular: Negative for chest pain, palpitations and leg swelling.  Gastrointestinal: Negative for abdominal distention, abdominal pain, constipation, nausea and vomiting.  Endocrine: Negative for cold intolerance, heat intolerance, polydipsia, polyphagia and polyuria.  Genitourinary: Negative for dysuria, flank pain, frequency and urgency.  Musculoskeletal: Positive for gait problem.       Bilateral shoulder pain   Skin: Negative for color change, pallor and rash.  Neurological: Negative for dizziness, seizures, syncope, light-headedness and headaches.  Hematological: Does not bruise/bleed easily.  Psychiatric/Behavioral: Negative for agitation, confusion, hallucinations and sleep disturbance. The patient is not nervous/anxious.     Immunization History   Administered Date(s) Administered  . Influenza Split 11/20/2010  . Influenza,inj,Quad PF,36+ Mos 01/02/2013  . Influenza-Unspecified 11/29/2015  . PPD Test 03/23/2015, 04/19/2015  . Pneumococcal Polysaccharide-23 02/23/2011   Pertinent  Health Maintenance Due  Topic Date Due  . DEXA SCAN  07/12/1994  . PNA vac Low Risk Adult (2 of 2 - PCV13) 02/23/2012  . INFLUENZA VACCINE  09/19/2016   No flowsheet data found. Functional Status Survey:    Vitals:   05/29/16 0927  BP: 128/64  Pulse: 64  Resp: 18  Temp: 98.2 F (36.8 C)  TempSrc: Oral  SpO2: 93%  Weight: 204 lb 1.6 oz (92.6 kg)  Height:  (1.778 m)   Body mass index is 29.29 kg/m. Physical Exam  Constitutional: She is oriented to person, place, and time. She appears well-nourished. No distress.  obese  HENT:  Head: Normocephalic.  Mouth/Throat: Oropharynx is clear and moist.  Eyes: Conjunctivae and EOM are normal. Pupils are equal, round, and reactive to light. Right eye exhibits no discharge. Left eye exhibits no discharge. No scleral icterus.  Neck: Normal range of motion. No JVD present.  Cardiovascular: Normal rate, regular rhythm, normal heart sounds and intact distal pulses.  Exam reveals no gallop and no friction rub.   No murmur heard. Pulmonary/Chest: Effort normal and breath sounds normal. No respiratory distress. She has no wheezes. She has no rales.  Abdominal: Soft. Bowel sounds are normal. She exhibits no distension. There is no tenderness. There is no rebound and no guarding.  Genitourinary:  Genitourinary Comments: Incontinent B/B   Musculoskeletal: She exhibits no edema, tenderness or deformity.  Unsteady gait. Bilateral UE limited ROM due to pain. Shoulder/elbow crepitus with ROM.   Lymphadenopathy:    She has no cervical adenopathy.  Neurological: She is oriented to person, place, and time.  Skin: Skin is warm and dry. No rash noted. No erythema. No pallor.  Psychiatric: She has a normal  mood and affect.    Labs reviewed:  Recent Labs  07/11/15 04/05/16  NA 143 144  K 4.7 4.8  BUN 16 15  CREATININE 0.9 0.9    Recent Labs  07/11/15 04/05/16  WBC 11.0 9.8  HGB 11.6* 12.3  HCT 39 39  PLT 397 363   Lab Results  Component Value Date   TSH 3.88 04/05/2016   Lab Results  Component Value Date   HGBA1C 5.6 07/11/2015   Lab Results  Component Value Date   CHOL 133 04/27/2016   HDL 34 (A) 04/27/2016   LDLCALC 62 04/27/2016   TRIG 187 (A) 04/27/2016   Assessment/Plan 1. Chronic pain of both shoulders Bilateral shoulder pain with crepitus. Has had cortisol injection with Ortho. Continue current pain regimen. Will consult with PMR for pain management. PT/OT to evaluate and treat as indicated.   2. Hypertensive heart disease with CHF (congestive heart failure) B/p stable.continue on amlodipine, coreg and furosemide.Obtain Hgb A1C 05/31/2016.   3. Major depression, chronic Continues to be tearful at times due to recent husband's death. Continue on Fluoxetine and Ativan. Continue to follow up with Psychiatry service.Encourage to get OOB daily.   4. Hypomagnesia   Continue on Mg supplements. Mg level 05/31/2016  Family/ staff Communication: Reviewed plan of care with patient and facility Nurse supervisor   Labs/tests ordered: Hgb A1C and Mg level 05/31/2016  Debra Bookman, NP

## 2016-05-31 LAB — HEMOGLOBIN A1C: Hemoglobin A1C: 5.6

## 2016-05-31 LAB — MAGNESIUM: MAGNESIUM: 1.9

## 2016-06-25 ENCOUNTER — Encounter: Payer: Self-pay | Admitting: Family

## 2016-06-25 ENCOUNTER — Non-Acute Institutional Stay (SKILLED_NURSING_FACILITY): Payer: Medicare Other | Admitting: Family

## 2016-06-25 DIAGNOSIS — E039 Hypothyroidism, unspecified: Secondary | ICD-10-CM

## 2016-06-25 DIAGNOSIS — I5032 Chronic diastolic (congestive) heart failure: Secondary | ICD-10-CM | POA: Diagnosis not present

## 2016-06-25 DIAGNOSIS — I110 Hypertensive heart disease with heart failure: Secondary | ICD-10-CM | POA: Diagnosis not present

## 2016-06-25 NOTE — Progress Notes (Signed)
Location:  [facility] Room Number: 503A Place of Service:  SNF (31) Provider: Cecilia Vancleve FNP-C   [name], MD  Patient Care Team: [name], MD as PCP - General (Internal Medicine)  Extended Emergency Contact Information Primary Emergency Contact: Page,Leroy Address: [address]          [place], [place] [address] [place] Home Phone: [phone] Mobile Phone: [phone] Relation: Spouse  Code Status:  DNR  Goals of care: Advanced Directive information Advanced Directives 06/25/2016  Does Patient Have a Medical Advance Directive? Yes  Type of Advance Directive Out of facility DNR (pink MOST or yellow form)  Does patient want to make changes to medical advance directive? -  Copy of Healthcare Power of Attorney in Chart? -  Would patient like information on creating a medical advance directive? -  Pre-existing out of facility DNR order (yellow form or pink MOST form) Yellow form placed in chart (order not valid for inpatient use)     Chief Complaint  Patient presents with  . Medical Management of Chronic Issues    Routine visit    HPI:  Pt is a 83 y.o. female seen today [facility] for medical management of chronic diseases.She has a medical history of HTN, CHF, hypothyroidism, fibromyalgia, CKD stage 3, Hyperlipidemia, Depression among other conditions. She is seen in her room today. She continues to complain of bilateral shoulder pain. She states had steroid injection with ortho but did not relief the pain. She continues to spend most time in the bed. She is tearful this visit states still thinking of her late husband. Facility Nurse reports no new concerns.       Past Medical History:  Diagnosis Date  . Anxiety   . Bronchitis 02/22/2011  . CHF (congestive heart failure) (HCC)    felt to have diastolic dysfunction with normal EF at 60%  . Chronic pain   . CKD (chronic kidney disease)  stage 4, GFR 15-29 ml/min (HCC)   . Depression   . DOE (dyspnea on exertion)    chronic   . Falls   . Fibromyalgia    with chronic weakness  . GERD (gastroesophageal reflux disease)   . Glaucoma   . Hyperlipidemia   . Hypertension   . Hypothyroidism   . Osteoarthritis   . PAF (paroxysmal atrial fibrillation) (HCC)    not felt to be a candidate for coumadin  . Physical deconditioning    Past Surgical History:  Procedure Laterality Date  . APPENDECTOMY     as a teenage  . CESAREAN SECTION    . CHOLECYSTECTOMY     pt denies having cholecystectomy  . FEMUR IM NAIL Right 03/23/2015   Procedure: INTRAMEDULLARY (IM) NAIL FEMORAL;  Surgeon: Durene Romans, MD;  Location: [facility];  Service: Orthopedics;  Laterality: Right;  . HEMORRHOID SURGERY    . KNEE SURGERY     bilateral  . LAPAROSCOPIC HYSTERECTOMY    . TRANSTHORACIC ECHOCARDIOGRAM  02/2011   EF 60% with diastolic dysfunction, high filling pressures    Allergies  Allergen Reactions  . Penicillins Swelling and Rash    Allergies as of 06/25/2016      Reactions   Penicillins Swelling, Rash      Medication List       Accurate as of 06/25/16  6:06 PM. Always use your most recent med list.          acetaminophen 500 MG tablet  Commonly known as:  TYLENOL Take 500 mg by mouth 4 (four) times daily.   albuterol (2.5 MG/3ML) 0.083% nebulizer solution Commonly known as:  PROVENTIL Take 3 mLs (2.5 mg total) by nebulization every 4 (four) hours as needed for wheezing or shortness of breath.   amLODipine 2.5 MG tablet Commonly known as:  NORVASC Take 2.5 mg by mouth daily.   aspirin 325 MG EC tablet Take 325 mg by mouth daily.   atorvastatin 20 MG tablet Commonly known as:  LIPITOR Take 20 mg by mouth daily.   baclofen 10 MG tablet Commonly known as:  LIORESAL Take 10 mg by mouth at bedtime. Tablet at bedtime and every 8 hrs as needed   bisacodyl 5 MG EC tablet Commonly known as:  DULCOLAX Take 1 tablet (5 mg total)  by mouth daily as needed for moderate constipation.   carvedilol 25 MG tablet Commonly known as:  COREG Take 25 mg by mouth 2 (two) times daily with a meal. Hold if sbp < 110   CERTAVITE SENIOR/ANTIOXIDANT Tabs Take 1 tablet by mouth daily.   diclofenac sodium 1 % Gel Commonly known as:  VOLTAREN Apply 2 g topically 4 (four) times daily.   docusate sodium 100 MG capsule Commonly known as:  COLACE Take 100 mg by mouth 2 (two) times daily.   dorzolamide-timolol 22.3-6.8 MG/ML ophthalmic solution Commonly known as:  COSOPT Place 1 drop into both eyes 2 (two) times daily.   ferrous sulfate 325 (65 FE) MG tablet Take 325 mg by mouth daily.   FLUoxetine HCl 60 MG Tabs Take 60 mg by mouth daily.   furosemide 40 MG tablet Commonly known as:  LASIX Take 40 mg by mouth daily. Hold if sbp < 110   HYDROcodone-acetaminophen 5-325 MG tablet Commonly known as:  NORCO/VICODIN Take 1 tablet by mouth every 6 (six) hours as needed. Hold if sleepy or confused   levothyroxine 88 MCG tablet Commonly known as:  SYNTHROID Take 1 tablet (88 mcg total) by mouth daily before breakfast.   lidocaine 5 % Commonly known as:  LIDODERM Place 2 patches onto the skin daily. Remove & Discard patch within 12 hours or as directed by MD   LORazepam 1 MG tablet Commonly known as:  ATIVAN Take 1 mg by mouth every morning.   LORazepam 0.5 MG tablet Commonly known as:  ATIVAN Take 0.75 mg by mouth at bedtime.   magnesium oxide 400 MG tablet Commonly known as:  MAG-OX Take 1,600 mg by mouth daily.   nitroGLYCERIN 0.4 MG SL tablet Commonly known as:  NITROSTAT Place 0.4 mg under the tongue every 5 (five) minutes as needed for chest pain. If pain is unrelived after 3 doses, call MD   OLANZapine 5 MG tablet Commonly known as:  ZYPREXA Take 5 mg by mouth daily.   oxybutynin 5 MG tablet Commonly known as:  DITROPAN Take 5 mg by mouth 2 (two) times daily.   OXYGEN Inhale into the lungs. Titrate 2-6  LPM to keep O2 stats > 93%   pantoprazole 20 MG tablet Commonly known as:  PROTONIX Take 20 mg by mouth daily before breakfast. 1 hour before breakfast   polyethylene glycol packet Commonly known as:  MIRALAX / GLYCOLAX Take 17 g by mouth daily.   potassium chloride SA 20 MEQ tablet Commonly known as:  K-DUR,KLOR-CON Take 20 mEq by mouth daily. Reported on 04/04/2015   saccharomyces boulardii 250 MG capsule Commonly known as:  FLORASTOR Take 250 mg  by mouth 2 (two) times daily.   senna 8.6 MG tablet Commonly known as:  SENOKOT Take 2 tablets by mouth at bedtime.   VITAMIN D-3 PO Take 1,000 Units by mouth daily.       Review of Systems  Constitutional: Negative for activity change, appetite change, chills, fatigue and fever.  HENT: Negative for congestion, rhinorrhea, sinus pressure, sneezing and sore throat.   Eyes: Negative.   Respiratory: Negative for cough, chest tightness and stridor.        Oxygen via nasal cannula.  Cardiovascular: Negative for chest pain, palpitations and leg swelling.  Gastrointestinal: Negative for abdominal distention, abdominal pain, constipation, nausea and vomiting.  Endocrine: Negative for cold intolerance, heat intolerance, polydipsia, polyphagia and polyuria.  Genitourinary: Negative for dysuria, flank pain, frequency and urgency.  Musculoskeletal: Positive for gait problem.       Bilateral shoulder pain   Skin: Negative for color change, pallor and rash.  Neurological: Negative for dizziness, seizures, syncope, light-headedness and headaches.  Hematological: Does not bruise/bleed easily.  Psychiatric/Behavioral: Negative for agitation, confusion, hallucinations and sleep disturbance. The patient is not nervous/anxious.     Immunization History  Administered Date(s) Administered  . Influenza Split 11/20/2010  . Influenza,inj,Quad PF,36+ Mos 01/02/2013  . Influenza-Unspecified 11/29/2015  . PPD Test 03/23/2015, 04/19/2015  .  Pneumococcal Polysaccharide-23 02/23/2011, 03/23/2016   Pertinent  Health Maintenance Due  Topic Date Due  . DEXA SCAN  07/12/1994  . PNA vac Low Risk Adult (2 of 2 - PCV13) 02/23/2012  . INFLUENZA VACCINE  09/19/2016   No flowsheet data found. Functional Status Survey:    Vitals:   06/25/16 1151  BP: 116/86  Pulse: 74  Resp: 18  Temp: 97.7 F (36.5 C)  SpO2: 98%  Weight: 204 lb 1.6 oz (92.6 kg)  Height: 5\' 10"  (1.778 m)   Body mass index is 29.29 kg/m. Physical Exam  Constitutional: She is oriented to person, place, and time. She appears well-nourished. No distress.  obese  HENT:  Head: Normocephalic.  Mouth/Throat: Oropharynx is clear and moist.  Eyes: Conjunctivae and EOM are normal. Pupils are equal, round, and reactive to light. Right eye exhibits no discharge. Left eye exhibits no discharge. No scleral icterus.  Neck: Normal range of motion. No JVD present.  Cardiovascular: Normal rate, regular rhythm, normal heart sounds and intact distal pulses.  Exam reveals no gallop and no friction rub.   No murmur heard. Pulmonary/Chest: Effort normal and breath sounds normal. No respiratory distress. She has no wheezes. She has no rales.  Abdominal: Soft. Bowel sounds are normal. She exhibits no distension. There is no tenderness. There is no rebound and no guarding.  Genitourinary:  Genitourinary Comments: Incontinent B/B   Musculoskeletal: She exhibits no edema, tenderness or deformity.  Unsteady gait. Bilateral UE limited ROM due to pain. Shoulder/elbow crepitus with ROM.   Lymphadenopathy:    She has no cervical adenopathy.  Neurological: She is oriented to person, place, and time.  Skin: Skin is warm and dry. No rash noted. No erythema. No pallor.  Psychiatric: She has a normal mood and affect.    Labs reviewed:  Recent Labs  07/11/15 04/05/16 05/31/16  NA 143 144  --   K 4.7 4.8  --   BUN 16 15  --   CREATININE 0.9 0.9  --   MG  --   --  1.9    Recent  Labs  07/11/15 04/05/16  WBC 11.0 9.8  HGB 11.6* 12.3  HCT 39 39  PLT 397 363   Lab Results  Component Value Date   TSH 3.88 04/05/2016   Lab Results  Component Value Date   HGBA1C 5.6 05/31/2016   Lab Results  Component Value Date   CHOL 133 04/27/2016   HDL 34 (A) 04/27/2016   LDLCALC 62 04/27/2016   TRIG 187 (A) 04/27/2016   Assessment/Plan 1. Hypertensive heart disease with CHF (congestive heart failure) B/p stable.continue on coreg,amlodipine and furosemide.monitor BMP.    2. CHF   Appears compensated.weight stable. Continue on Coreg and furosemide. Continue on fluid restrictions. Monitor weight.    3. Hypothyroidism  Lab Results  Component Value Date   TSH 3.88 04/05/2016  Continue on levothyroxine 88 mcg Tablet daily. Monitor TSH level.   Family/ staff Communication: Reviewed plan of care with patient and facility Nurse supervisor   Labs/tests ordered: None  Caesar Bookmaninah C Giuliana Handyside, NP

## 2016-10-30 IMAGING — RF DG FEMUR 2+V*R*
1 series · 4 of 4 positions shown · non-contrast
Comparison: Preoperative radiographs 03/21/2015

CLINICAL DATA: Right femoral IM nail.

EXAM:
RIGHT FEMUR 2 VIEWS; DG C-ARM 61-120 MIN-NO REPORT

[Series 1: run · 4 of 4 slices shown]
[im 1/4]
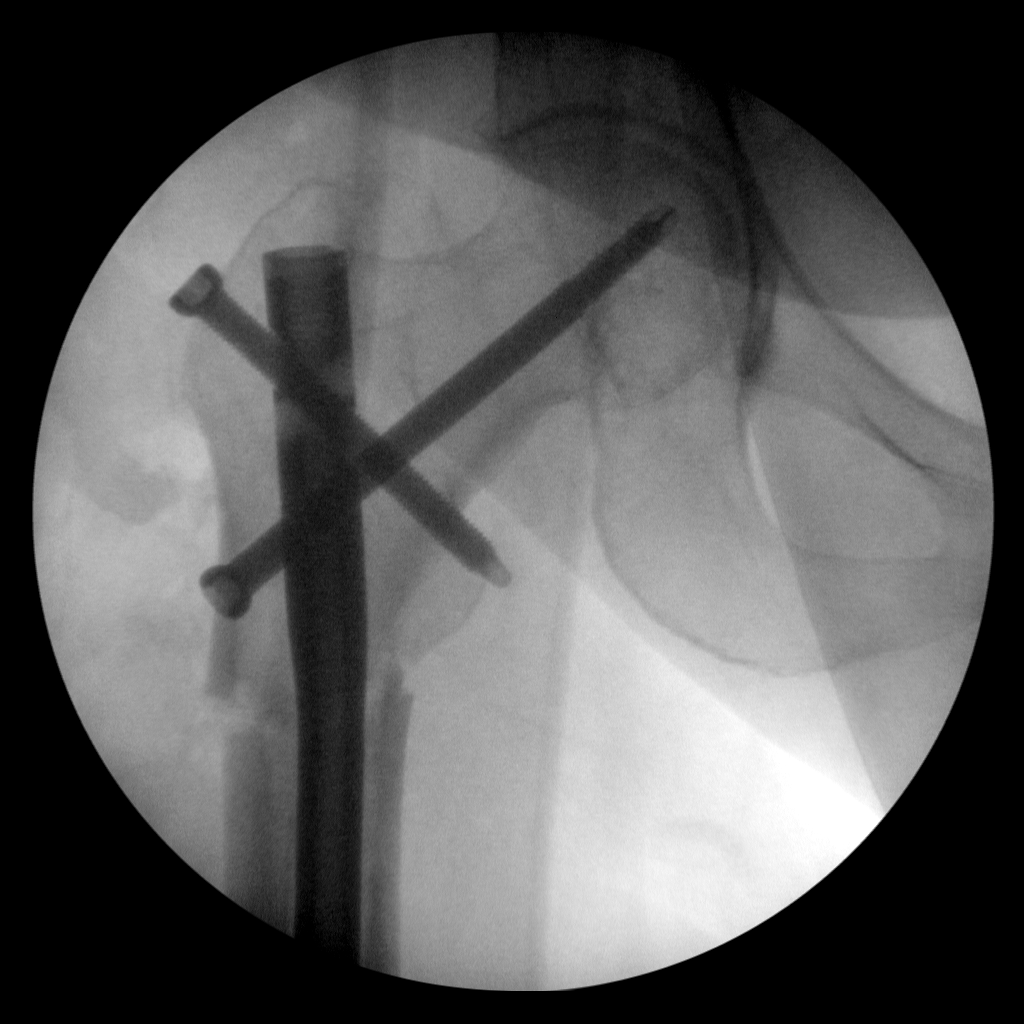
[im 2/4]
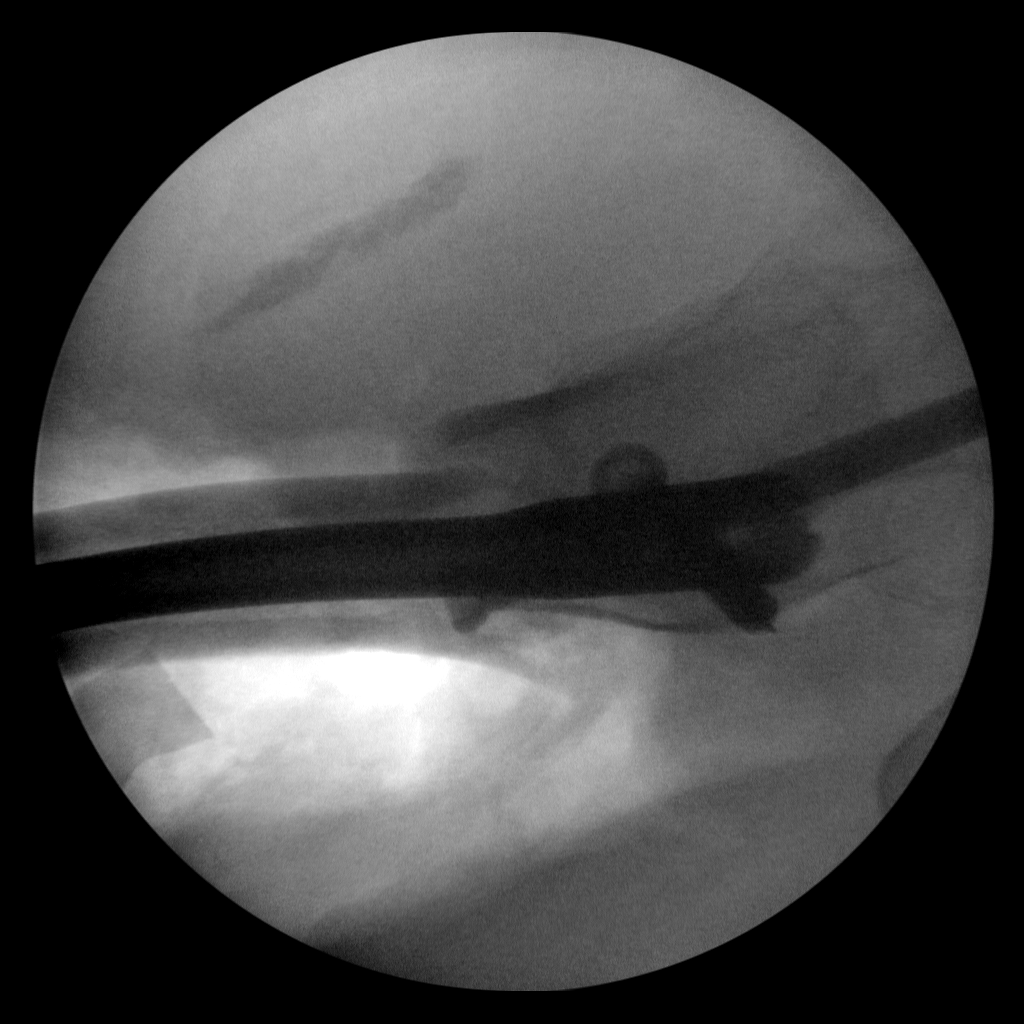
[im 3/4]
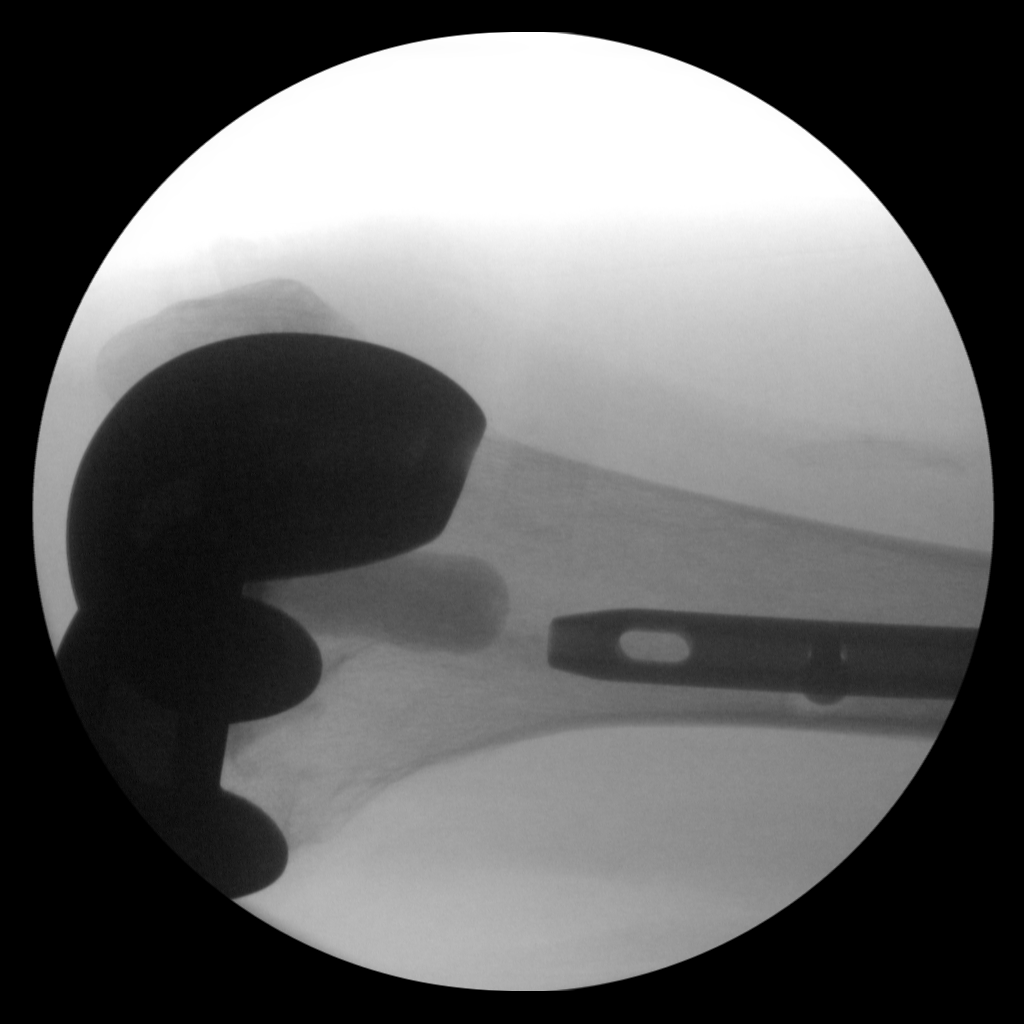
[im 4/4]
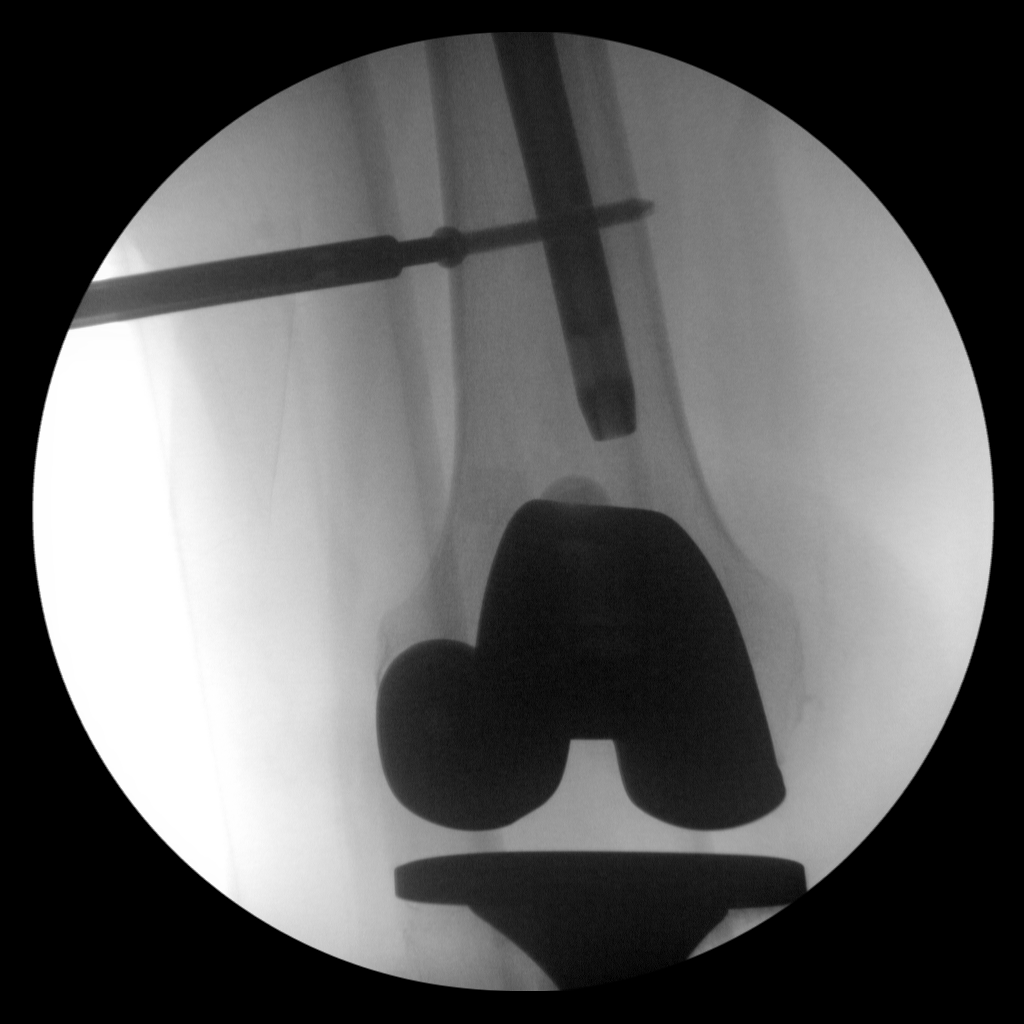

[4 of 4 positions shown; findings below may reference images not displayed]

FINDINGS: Four fluoroscopic spot images post IM nail insertion demonstrates
intra medullary nail in the right femur with proximal and distal
locking screws. A trans trochanteric screw is seen. Improved
fracture alignment compared to preoperative exam. Fluoroscopy time
reported 3 minutes 59 seconds. Exposure 61.94 mGy.
IMPRESSION: Fluoroscopic spot images post IM nail insertion fixating
subtrochanteric right femur fracture. Improved fracture alignment.

## 2016-11-04 IMAGING — DX DG CHEST 1V PORT
1 series · 1 of 1 positions shown · non-contrast
Comparison: 03/25/2014

CLINICAL DATA: Shortness of breath

EXAM:
PORTABLE CHEST 1 VIEW

[chest ap]
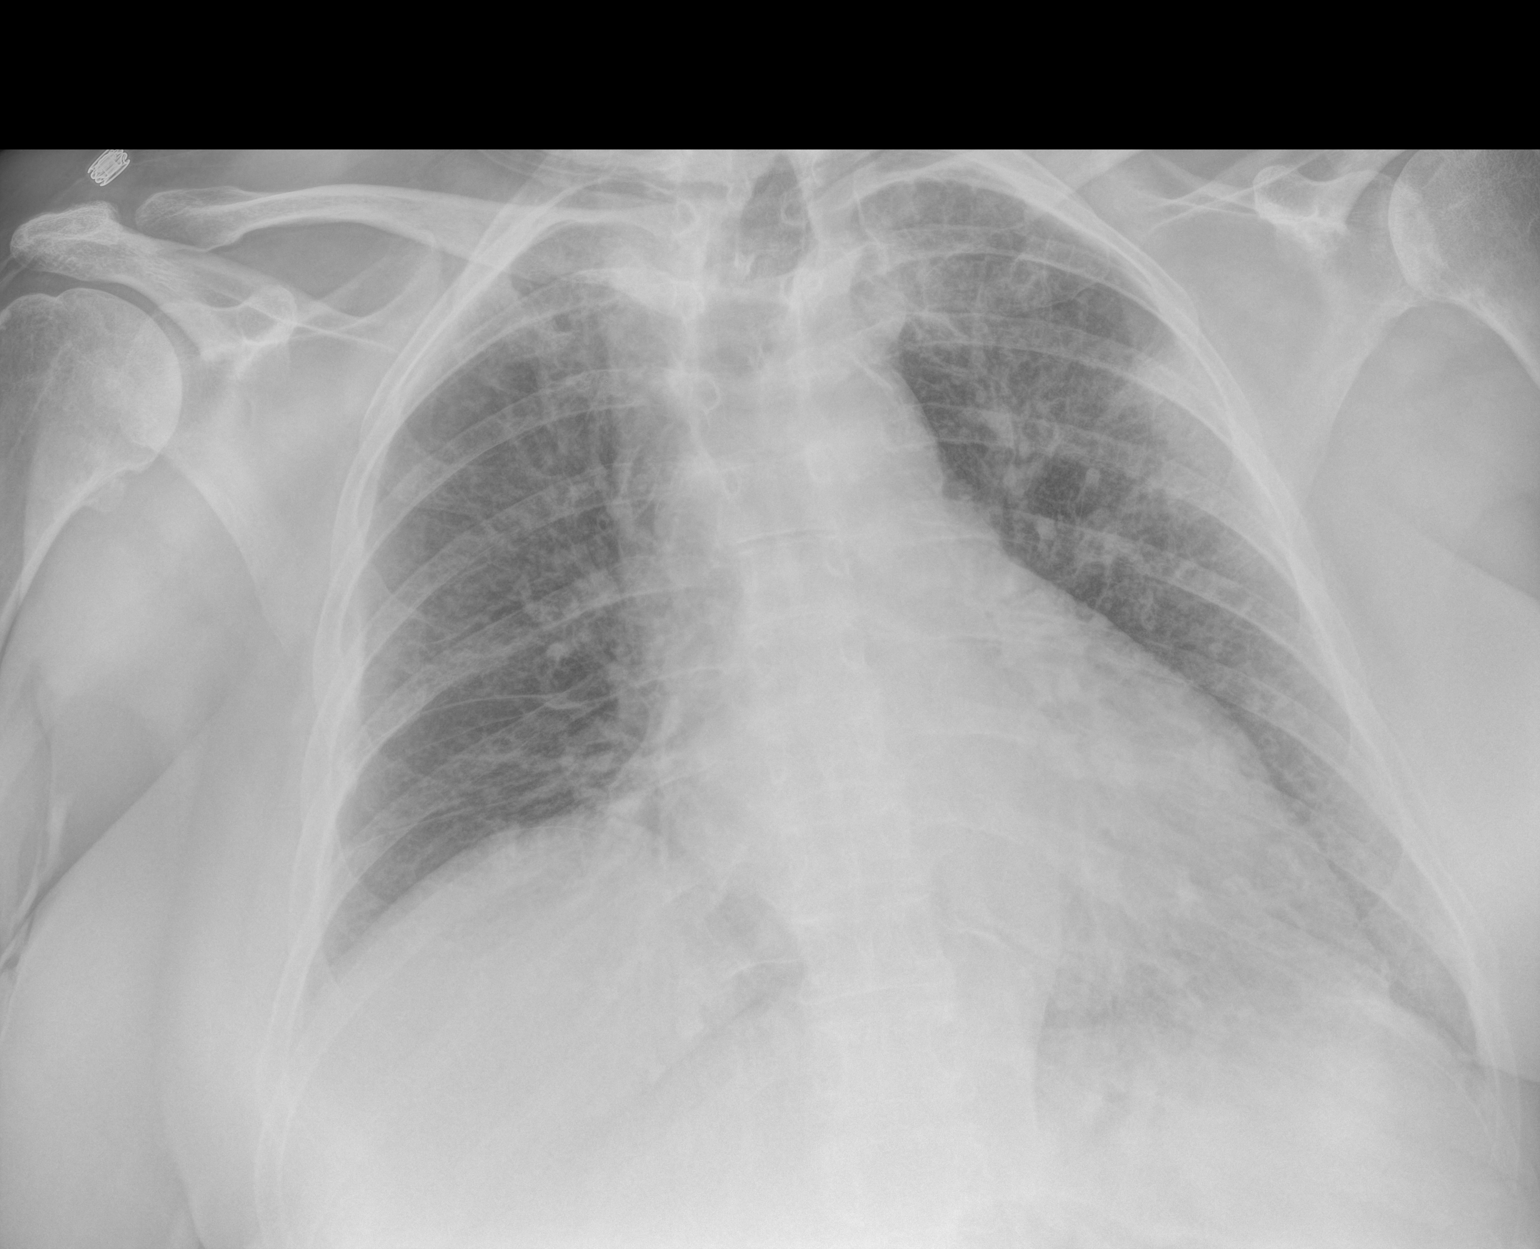

[1 of 1 positions shown; findings below may reference images not displayed]

FINDINGS: Mild patchy left upper lobe opacity, new/ increased, suspicious for
pneumonia.

Increased interstitial markings.  No frank interstitial edema.

Mild eventration of the right hemidiaphragm. No pleural effusion or
pneumothorax.

Cardiomegaly.
IMPRESSION: Mild patchy left upper lobe opacity, new/ increased, suspicious for
pneumonia.

## 2017-02-18 ENCOUNTER — Emergency Department (HOSPITAL_COMMUNITY): Payer: Medicare Other

## 2017-02-18 ENCOUNTER — Inpatient Hospital Stay (HOSPITAL_COMMUNITY)
Admission: EM | Admit: 2017-02-18 | Discharge: 2017-02-22 | DRG: 194 | Disposition: A | Discharge status: Skilled Nursing Facility | Payer: Medicare Other | Attending: Family Medicine | Admitting: Family Medicine

## 2017-02-18 ENCOUNTER — Encounter (HOSPITAL_COMMUNITY): Payer: Self-pay

## 2017-02-18 DIAGNOSIS — K219 Gastro-esophageal reflux disease without esophagitis: Secondary | ICD-10-CM | POA: Diagnosis present

## 2017-02-18 DIAGNOSIS — R441 Visual hallucinations: Secondary | ICD-10-CM | POA: Diagnosis present

## 2017-02-18 DIAGNOSIS — E785 Hyperlipidemia, unspecified: Secondary | ICD-10-CM | POA: Diagnosis present

## 2017-02-18 DIAGNOSIS — G8929 Other chronic pain: Secondary | ICD-10-CM | POA: Diagnosis present

## 2017-02-18 DIAGNOSIS — K8020 Calculus of gallbladder without cholecystitis without obstruction: Secondary | ICD-10-CM | POA: Diagnosis present

## 2017-02-18 DIAGNOSIS — Z6829 Body mass index (BMI) 29.0-29.9, adult: Secondary | ICD-10-CM

## 2017-02-18 DIAGNOSIS — J189 Pneumonia, unspecified organism: Secondary | ICD-10-CM | POA: Diagnosis present

## 2017-02-18 DIAGNOSIS — Y95 Nosocomial condition: Secondary | ICD-10-CM | POA: Diagnosis present

## 2017-02-18 DIAGNOSIS — E039 Hypothyroidism, unspecified: Secondary | ICD-10-CM | POA: Diagnosis present

## 2017-02-18 DIAGNOSIS — F411 Generalized anxiety disorder: Secondary | ICD-10-CM | POA: Diagnosis present

## 2017-02-18 DIAGNOSIS — I130 Hypertensive heart and chronic kidney disease with heart failure and stage 1 through stage 4 chronic kidney disease, or unspecified chronic kidney disease: Secondary | ICD-10-CM | POA: Diagnosis present

## 2017-02-18 DIAGNOSIS — E46 Unspecified protein-calorie malnutrition: Secondary | ICD-10-CM | POA: Diagnosis present

## 2017-02-18 DIAGNOSIS — I2510 Atherosclerotic heart disease of native coronary artery without angina pectoris: Secondary | ICD-10-CM | POA: Diagnosis present

## 2017-02-18 DIAGNOSIS — Z7401 Bed confinement status: Secondary | ICD-10-CM

## 2017-02-18 DIAGNOSIS — Z9981 Dependence on supplemental oxygen: Secondary | ICD-10-CM

## 2017-02-18 DIAGNOSIS — F329 Major depressive disorder, single episode, unspecified: Secondary | ICD-10-CM | POA: Diagnosis present

## 2017-02-18 DIAGNOSIS — R0902 Hypoxemia: Secondary | ICD-10-CM

## 2017-02-18 DIAGNOSIS — I5032 Chronic diastolic (congestive) heart failure: Secondary | ICD-10-CM | POA: Diagnosis present

## 2017-02-18 DIAGNOSIS — N184 Chronic kidney disease, stage 4 (severe): Secondary | ICD-10-CM | POA: Diagnosis present

## 2017-02-18 DIAGNOSIS — Z880 Allergy status to penicillin: Secondary | ICD-10-CM | POA: Diagnosis not present

## 2017-02-18 DIAGNOSIS — M797 Fibromyalgia: Secondary | ICD-10-CM | POA: Diagnosis present

## 2017-02-18 DIAGNOSIS — J181 Lobar pneumonia, unspecified organism: Secondary | ICD-10-CM | POA: Diagnosis present

## 2017-02-18 DIAGNOSIS — Z7982 Long term (current) use of aspirin: Secondary | ICD-10-CM

## 2017-02-18 DIAGNOSIS — F0390 Unspecified dementia without behavioral disturbance: Secondary | ICD-10-CM | POA: Diagnosis present

## 2017-02-18 DIAGNOSIS — R443 Hallucinations, unspecified: Secondary | ICD-10-CM

## 2017-02-18 DIAGNOSIS — E875 Hyperkalemia: Secondary | ICD-10-CM | POA: Diagnosis present

## 2017-02-18 DIAGNOSIS — I480 Paroxysmal atrial fibrillation: Secondary | ICD-10-CM | POA: Diagnosis present

## 2017-02-18 DIAGNOSIS — D638 Anemia in other chronic diseases classified elsewhere: Secondary | ICD-10-CM | POA: Diagnosis present

## 2017-02-18 DIAGNOSIS — Z79899 Other long term (current) drug therapy: Secondary | ICD-10-CM

## 2017-02-18 DIAGNOSIS — Z9181 History of falling: Secondary | ICD-10-CM

## 2017-02-18 HISTORY — DX: Pneumonia, unspecified organism: J18.9

## 2017-02-18 LAB — CBC WITH DIFFERENTIAL/PLATELET
BASOS ABS: 0.1 10*3/uL (ref 0.0–0.1)
Basophils Relative: 1 %
EOS ABS: 0.4 10*3/uL (ref 0.0–0.7)
EOS PCT: 4 %
HCT: 42.2 % (ref 36.0–46.0)
Hemoglobin: 13.2 g/dL (ref 12.0–15.0)
LYMPHS PCT: 22 %
Lymphs Abs: 2.2 10*3/uL (ref 0.7–4.0)
MCH: 30.3 pg (ref 26.0–34.0)
MCHC: 31.3 g/dL (ref 30.0–36.0)
MCV: 96.8 fL (ref 78.0–100.0)
MONO ABS: 0.7 10*3/uL (ref 0.1–1.0)
Monocytes Relative: 7 %
Neutro Abs: 6.7 10*3/uL (ref 1.7–7.7)
Neutrophils Relative %: 66 %
PLATELETS: 369 10*3/uL (ref 150–400)
RBC: 4.36 MIL/uL (ref 3.87–5.11)
RDW: 15.6 % — AB (ref 11.5–15.5)
WBC: 10 10*3/uL (ref 4.0–10.5)

## 2017-02-18 LAB — TROPONIN I

## 2017-02-18 LAB — URINALYSIS, ROUTINE W REFLEX MICROSCOPIC
Bilirubin Urine: NEGATIVE
Glucose, UA: NEGATIVE mg/dL
Hgb urine dipstick: NEGATIVE
KETONES UR: NEGATIVE mg/dL
LEUKOCYTES UA: NEGATIVE
NITRITE: NEGATIVE
PROTEIN: NEGATIVE mg/dL
Specific Gravity, Urine: 1.012 (ref 1.005–1.030)
pH: 7 (ref 5.0–8.0)

## 2017-02-18 LAB — COMPREHENSIVE METABOLIC PANEL
ALT: 20 U/L (ref 14–54)
AST: 30 U/L (ref 15–41)
Albumin: 3.4 g/dL — ABNORMAL LOW (ref 3.5–5.0)
Alkaline Phosphatase: 73 U/L (ref 38–126)
Anion gap: 12 (ref 5–15)
BUN: 7 mg/dL (ref 6–20)
CHLORIDE: 107 mmol/L (ref 101–111)
CO2: 22 mmol/L (ref 22–32)
Calcium: 9.4 mg/dL (ref 8.9–10.3)
Creatinine, Ser: 0.95 mg/dL (ref 0.44–1.00)
GFR, EST NON AFRICAN AMERICAN: 52 mL/min — AB (ref >60)
Glucose, Bld: 126 mg/dL — ABNORMAL HIGH (ref 65–99)
POTASSIUM: 3.8 mmol/L (ref 3.5–5.1)
SODIUM: 141 mmol/L (ref 135–145)
Total Bilirubin: 0.6 mg/dL (ref 0.3–1.2)
Total Protein: 6.5 g/dL (ref 6.5–8.1)

## 2017-02-18 LAB — BRAIN NATRIURETIC PEPTIDE: B NATRIURETIC PEPTIDE 5: 107.1 pg/mL — AB (ref 0.0–100.0)

## 2017-02-18 LAB — PROTIME-INR
INR: 1.04
PROTHROMBIN TIME: 13.5 s (ref 11.4–15.2)

## 2017-02-18 LAB — TSH: TSH: 4.407 u[IU]/mL (ref 0.350–4.500)

## 2017-02-18 MED ORDER — POTASSIUM CHLORIDE CRYS ER 20 MEQ PO TBCR
20.0000 meq | EXTENDED_RELEASE_TABLET | Freq: Every day | ORAL | Status: DC
  Administered 2017-02-19 – 2017-02-22 (×5): 20 meq via ORAL
  Filled 2017-02-18 (×5): qty 1

## 2017-02-18 MED ORDER — ALBUTEROL SULFATE (2.5 MG/3ML) 0.083% IN NEBU: 2.5000 mg | INHALATION_SOLUTION | RESPIRATORY_TRACT | Status: DC | PRN

## 2017-02-18 MED ORDER — VANCOMYCIN HCL 10 G IV SOLR
2000.0000 mg | Freq: Once | INTRAVENOUS | Status: AC
  Administered 2017-02-18: 2000 mg via INTRAVENOUS
  Filled 2017-02-18: qty 2000

## 2017-02-18 MED ORDER — FUROSEMIDE 40 MG PO TABS
40.0000 mg | ORAL_TABLET | Freq: Every day | ORAL | Status: DC
  Administered 2017-02-19 – 2017-02-22 (×5): 40 mg via ORAL
  Filled 2017-02-18 (×5): qty 1

## 2017-02-18 MED ORDER — CARVEDILOL 25 MG PO TABS
25.0000 mg | ORAL_TABLET | Freq: Two times a day (BID) | ORAL | Status: DC
  Administered 2017-02-19 – 2017-02-22 (×7): 25 mg via ORAL
  Filled 2017-02-18 (×7): qty 1

## 2017-02-18 MED ORDER — ASPIRIN EC 325 MG PO TBEC
325.0000 mg | DELAYED_RELEASE_TABLET | Freq: Every day | ORAL | Status: DC
Start: 2017-02-18 — End: 2017-02-22
  Administered 2017-02-19 – 2017-02-22 (×5): 325 mg via ORAL
  Filled 2017-02-18 (×5): qty 1

## 2017-02-18 MED ORDER — PANTOPRAZOLE SODIUM 20 MG PO TBEC
20.0000 mg | DELAYED_RELEASE_TABLET | Freq: Every day | ORAL | Status: DC
  Administered 2017-02-19 – 2017-02-22 (×4): 20 mg via ORAL
  Filled 2017-02-18 (×4): qty 1

## 2017-02-18 MED ORDER — AMLODIPINE BESYLATE 2.5 MG PO TABS
2.5000 mg | ORAL_TABLET | Freq: Every day | ORAL | Status: DC
  Administered 2017-02-19 – 2017-02-22 (×5): 2.5 mg via ORAL
  Filled 2017-02-18 (×5): qty 1

## 2017-02-18 MED ORDER — IPRATROPIUM-ALBUTEROL 0.5-2.5 (3) MG/3ML IN SOLN
3.0000 mL | Freq: Three times a day (TID) | RESPIRATORY_TRACT | Status: DC
  Filled 2017-02-18: qty 3

## 2017-02-18 MED ORDER — IOPAMIDOL (ISOVUE-370) INJECTION 76%
INTRAVENOUS | Status: AC
  Administered 2017-02-18: 100 mL
  Filled 2017-02-18: qty 100

## 2017-02-18 MED ORDER — LEVOTHYROXINE SODIUM 88 MCG PO TABS
88.0000 ug | ORAL_TABLET | Freq: Every day | ORAL | Status: DC
  Administered 2017-02-19 – 2017-02-22 (×4): 88 ug via ORAL
  Filled 2017-02-18 (×4): qty 1

## 2017-02-18 MED ORDER — DEXTROSE 5 % IV SOLN
1.0000 g | Freq: Three times a day (TID) | INTRAVENOUS | Status: DC
  Administered 2017-02-18 – 2017-02-20 (×5): 1 g via INTRAVENOUS
  Filled 2017-02-18 (×6): qty 1

## 2017-02-18 MED ORDER — OLANZAPINE 5 MG PO TABS
5.0000 mg | ORAL_TABLET | Freq: Every day | ORAL | Status: DC
  Administered 2017-02-18 – 2017-02-22 (×5): 5 mg via ORAL
  Filled 2017-02-18 (×5): qty 1

## 2017-02-18 MED ORDER — IPRATROPIUM-ALBUTEROL 0.5-2.5 (3) MG/3ML IN SOLN
3.0000 mL | Freq: Three times a day (TID) | RESPIRATORY_TRACT | Status: DC
  Administered 2017-02-18 – 2017-02-20 (×5): 3 mL via RESPIRATORY_TRACT
  Filled 2017-02-18 (×4): qty 3

## 2017-02-18 MED ORDER — VANCOMYCIN HCL IN DEXTROSE 750-5 MG/150ML-% IV SOLN
750.0000 mg | Freq: Two times a day (BID) | INTRAVENOUS | Status: DC
  Administered 2017-02-19 – 2017-02-20 (×4): 750 mg via INTRAVENOUS
  Filled 2017-02-18 (×5): qty 150

## 2017-02-18 MED ORDER — DEXTROSE 5 % IV SOLN
2.0000 g | Freq: Once | INTRAVENOUS | Status: AC
  Administered 2017-02-18: 2 g via INTRAVENOUS
  Filled 2017-02-18: qty 2

## 2017-02-18 MED ORDER — ENOXAPARIN SODIUM 40 MG/0.4ML ~~LOC~~ SOLN
40.0000 mg | SUBCUTANEOUS | Status: DC
  Administered 2017-02-18 – 2017-02-21 (×4): 40 mg via SUBCUTANEOUS
  Filled 2017-02-18 (×4): qty 0.4

## 2017-02-18 MED ORDER — BACLOFEN 10 MG PO TABS
10.0000 mg | ORAL_TABLET | Freq: Every day | ORAL | Status: DC
  Administered 2017-02-18 – 2017-02-21 (×4): 10 mg via ORAL
  Filled 2017-02-18 (×4): qty 1

## 2017-02-18 MED ORDER — FLUOXETINE HCL 20 MG PO CAPS
60.0000 mg | ORAL_CAPSULE | Freq: Every day | ORAL | Status: DC
  Administered 2017-02-19 – 2017-02-22 (×5): 60 mg via ORAL
  Filled 2017-02-18 (×5): qty 3

## 2017-02-18 MED ORDER — DORZOLAMIDE HCL-TIMOLOL MAL 2-0.5 % OP SOLN
1.0000 [drp] | Freq: Two times a day (BID) | OPHTHALMIC | Status: DC
  Administered 2017-02-18 – 2017-02-22 (×8): 1 [drp] via OPHTHALMIC
  Filled 2017-02-18: qty 10

## 2017-02-18 MED ORDER — ATORVASTATIN CALCIUM 20 MG PO TABS
20.0000 mg | ORAL_TABLET | Freq: Every day | ORAL | Status: DC
  Administered 2017-02-19 – 2017-02-22 (×5): 20 mg via ORAL
  Filled 2017-02-18 (×2): qty 1
  Filled 2017-02-18: qty 2
  Filled 2017-02-18 (×2): qty 1

## 2017-02-18 NOTE — ED Notes (Signed)
Given coke to drink. 

## 2017-02-18 NOTE — Progress Notes (Signed)
Pharmacy Antibiotic Note  Debra Johnston is a 83 y.o. female admitted on 02/18/2017 with altered mental status and fatigue. Pharmacy has been consulted for vancomycin dosing for pneumonia. Pt is afebrile and WBC is WNL. SCr is WNL. Pt had been on levaquin PTA.   PM addendum adding Azactam for HCAP coverage with PCN allergy Plan: Vancomycin 2gm IV x 1 then 750mg  IV Q12H F/u renal fxn, C&S, clinical status and trough at SS Azactam 2gm x1 given in ED then 1gm IV q8hr   Height: 5\' 10"  (177.8 cm) Weight: 210 lb 8.6 oz (95.5 kg) IBW/kg (Calculated) : 68.5  Temp (24hrs), Avg:98.8 F (37.1 C), Min:98.3 F (36.8 C), Max:99.2 F (37.3 C)  Recent Labs  Lab 02/18/17 0804  WBC 10.0  CREATININE 0.95    Estimated Creatinine Clearance: 52.2 mL/min (by C-G formula based on SCr of 0.95 mg/dL).    Allergies  Allergen Reactions  . Penicillins Swelling and Rash    Antimicrobials this admission: Vanc 12/31>> Aztreo 12/31 >>  Dose adjustments this admission: N/A  Microbiology results: Pending  Leota SauersLisa Zeyna Mkrtchyan Pharm.D. CPP, BCPS Clinical Pharmacist [phone] 02/18/2017 7:11 PM

## 2017-02-18 NOTE — H&P (Signed)
Family Medicine Teaching [facility] Admission History and Physical Service Pager: [phone]  Patient name: Debra Johnston Medical record number: [id number] Date of birth: 08-16-1927 Age: 83 y.o. Gender: female  Primary Care Provider: [name], MD Consultants: None Code Status: Full  Chief Complaint: Fatigue  Assessment and Plan: Debra Johnston is a 83 y.o. female presenting with fatigue, non-productive cough, SOB, and confusion found to have HAP on CTA after failing 3 courses of outpatient treatment for PNA. She is currently a BIBEMS from [facility]. PMH is significant for HFpEF, PAF, HTN, HLD, CKDIV, hypothyroidism, GERD, glaucoma, fibromyalgia, anxiety and depression.  [facility] healthcare acquired pneumonia  Hypoxia  AMS: Acute.  New oxygen requirement and symptoms of nonproductive cough with fatigue consistent with CXR and CTA findings of RLL pneumonia.  Suspect this may be secondary to viral bronchitis with failed outpatient treatment for secondary bacterial infection.  CTA negative for PE.  No signs of ACS, HF exacerbation or aortic dissection.  - Admit to telemetry, attending Dr. Deirdre Priest - Will treat HAP with vancomycin and aztreonam given penicillin allergy - Continuous pulse ox  - Pending blood cultures, sputum stain and culture - Monitor fever curve and WBC trend - Pending Legionella and strep pneumo urine antigen - Supplemental oxygen, keep sat >88%, incentive spirometry - Albuterol nebulizer every 4 hours as needed for wheezing or SOB  HFpEF  CAD: Chronic.  Stable.  Last echo EF 55-60%, moderate concentric hypertrophy with severe septal hypertrophy, no wall motion abnormality, mildly dilated aortic root, mildly dilated right ventricle as of 03/22/2015.  Sees Dr. [place] for cardiology management.  BNP and clinical presentation does not support fluid overload. Negative troponin on admission.  No symptoms of ACS. - Telemetry  PAF: Chronic.  Stable.  Regular rate and  rhythm on admission.  Was previously on Coumadin but discontinued due to history of falls. - Telemetry - Morning EKG 12-lead  Cholelithiasis: Incidental finding on CTA.  Patient asymptomatic at present. - Follow-up outpatient, consider EGD  GERD  Proximal esophageal thickening: Incidental finding on CTA.  Patient does endorse symptoms of epigastric discomfort with eating.  Patient also endorses history of EGD many years ago. - Continue Protonix 20 mg daily  CKD stage IV: Creatinine at 0.95 on admission.  Appears to be at baseline. - Morning BMET -Continue Lasix 40 mg daily and K-Dur daily  HTN: Chronic.  Stable.  Normotensive on admission. - Continue home amlodipine 2.5 mg daily, Coreg 25 mg twice daily, aspirin 325 mg daily, Lipitor 20 mg daily   Hypothyroidism: Chronic.  Stable.  TSH WNL on admission. - Continue Synthroid 88 mcg daily  Fibromyalgia: Chronic.  Stable. - Continue baclofen 10 mg prior to bedtime  Protein calorie malnutrition: Unspecified severity.  Patient is chronically bedbound with multiple comorbidities. - Dietary consult pending  Morbid obesity  H/o falls: Chronic.  Significant deconditioning.  This is complicated by chronic bedbound state. - PT/OT eval - CSW for SNF placement  Dementia with visual hallucinations  depression and anxiety: Patient with baseline dementia and increase symptoms of visual hallucinations of her husband who had past 1 year ago.  Patient does endorse some symptoms of anxiety but more depression.  Denies SI/HI. - Continue home olanzapine 5 mg daily and Prozac 40 mg daily  FEN/GI: Heart healthy diet Prophylaxis: Lovenox SQ  Disposition: Pending improvement with IV antibiotics with transition to oral regimen for HAP.  History of Present Illness:  Debra Johnston is a 83 y.o. female presenting  with fatigue, non-productive cough, SOB, and confusion found to have HAP on CTA after failing 3 courses of outpatient treatment for PNA. She is  currently a BIBEMS from [facility]. PMH is significant for HFpEF, PAF, HTN, HLD, CKDIV, hypothyroidism, GERD, glaucoma, fibromyalgia, anxiety and depression  Patient has been experiencing continual shortness of breath with nonproductive cough, increased fatigue and confusion since Thanksgiving.  She has completed 3 separate courses of antibiotics for diagnosed bronchitis without improvement (though Levaquin is most recent per med rec).  She is unsure which antibiotic she was taking during this time or if there was any improvement between the courses.  She denies fevers or chills, nausea or abdominal pain, peripheral edema, or chest pain.  She does not have an oxygen requirement.  The ED physician discussed with nurse Lola who works at [employer] who endorsed 3 separate courses of Levaquin over the last 6 weeks.  Patient appeared more confused and lethargic while hallucinating that her late husband was present who had passed one year ago.  Patient is accompanied by her son and daughter-in-law he states she is not far off her baseline but is significantly more tired.  She is bed bound and has baseline dementia.  Upon arrival to ED, patient was placed on nasal cannula.  CXR consistent with right-sided pneumonia.  Decision to hospitalize was made based upon failure to improve with outpatient management.  Review Of Systems: See HPI for pertinent  Review of Systems  Constitutional: Positive for malaise/fatigue. Negative for chills, diaphoresis and fever.  HENT: Negative for congestion and sore throat.   Eyes: Negative for blurred vision and double vision.  Respiratory: Positive for cough and shortness of breath. Negative for hemoptysis, sputum production and wheezing.   Cardiovascular: Negative for chest pain, palpitations, orthopnea, leg swelling and PND.  Gastrointestinal: Positive for heartburn. Negative for abdominal pain, blood in stool, diarrhea, melena, nausea and vomiting.  Genitourinary:  Negative for dysuria, frequency and urgency.  Musculoskeletal: Negative for myalgias and neck pain.  Skin: Negative for itching and rash.  Neurological: Positive for weakness. Negative for tremors and focal weakness.    Patient Active Problem List   Diagnosis Date Noted  . GAD (generalized anxiety disorder) 09/05/2015  . Urinary retention with incomplete bladder emptying 09/05/2015  . Anemia of chronic disease 09/05/2015  . Major depression, chronic 09/05/2015  . Hyperlipidemia 05/11/2015  . Obesity 05/11/2015  . Adjustment disorder with mixed anxiety and depressed mood 05/11/2015  . Other specified hypothyroidism 05/11/2015  . Iron deficiency 05/11/2015  . Intertrochanteric fracture of right hip (HCC) 05/11/2015  . Anemia 04/06/2015  . Protein-calorie malnutrition (HCC) 04/06/2015  . Preoperative cardiovascular examination   . Fracture, subtrochanteric, right femur, closed (HCC) 03/22/2015  . CKD (chronic kidney disease) stage 3, GFR 30-59 ml/min (HCC) 03/22/2015  . Oxygen dependent 03/22/2015  . Fibromyalgia affecting lower leg 03/16/2015  . Hypertensive heart disease with CHF (congestive heart failure) (HCC) 07/01/2012  . PAF (paroxysmal atrial fibrillation) (HCC) 06/19/2011  . Hypothyroidism 06/19/2011  . Chronic diastolic CHF (congestive heart failure) (HCC) 03/12/2011    Past Medical History: Past Medical History:  Diagnosis Date  . Anxiety   . Bronchitis 02/22/2011  . CHF (congestive heart failure) (HCC)    felt to have diastolic dysfunction with normal EF at 60%  . Chronic pain   . CKD (chronic kidney disease) stage 4, GFR 15-29 ml/min (HCC)   . Depression   . DOE (dyspnea on exertion)    chronic   .  Falls   . Fibromyalgia    with chronic weakness  . GERD (gastroesophageal reflux disease)   . Glaucoma   . Hyperlipidemia   . Hypertension   . Hypothyroidism   . Osteoarthritis   . PAF (paroxysmal atrial fibrillation) (HCC)    not felt to be a candidate for  coumadin  . Physical deconditioning     Past Surgical History: Past Surgical History:  Procedure Laterality Date  . APPENDECTOMY     as a teenage  . CESAREAN SECTION    . CHOLECYSTECTOMY     pt denies having cholecystectomy  . FEMUR IM NAIL Right 03/23/2015   Procedure: INTRAMEDULLARY (IM) NAIL FEMORAL;  Surgeon: Durene Romans, MD;  Location: [facility]S;  Service: Orthopedics;  Laterality: Right;  . HEMORRHOID SURGERY    . KNEE SURGERY     bilateral  . LAPAROSCOPIC HYSTERECTOMY    . TRANSTHORACIC ECHOCARDIOGRAM  02/2011   EF 60% with diastolic dysfunction, high filling pressures    Social History: Social History   Tobacco Use  . Smoking status: Never Smoker  . Smokeless tobacco: Never Used  Substance Use Topics  . Alcohol use: No  . Drug use: No   Additional social history: Resident at [employer].  Bedbound.  Denies tobacco use, EtOH use, or IV drug use. Please also refer to relevant sections of EMR.  Family History: Family History  Problem Relation Age of Onset  . Heart failure Mother   . Heart attack Mother   . Stroke Father   . Lymphoma Brother   . Cancer Brother    Allergies and Medications: Allergies  Allergen Reactions  . Penicillins Swelling and Rash   Current Facility-Administered Medications on File Prior to Encounter  Medication Dose Route Frequency Provider Last Rate Last Dose  . ipratropium-albuterol (DUONEB) 0.5-2.5 (3) MG/3ML nebulizer solution 3 mL  3 mL Nebulization Q6H Ngetich, Dinah C, NP       Current Outpatient Medications on File Prior to Encounter  Medication Sig Dispense Refill  . atorvastatin (LIPITOR) 20 MG tablet Take 20 mg by mouth daily.     . baclofen (LIORESAL) 10 MG tablet Take 10 mg by mouth at bedtime. Tablet at bedtime and every 8 hrs as needed    . carvedilol (COREG) 25 MG tablet Take 25 mg by mouth 2 (two) times daily with a meal. Hold if sbp < 110    . Cholecalciferol (VITAMIN D-3 PO) Take 1,000 Units by mouth daily.    Marland Kitchen  docusate sodium (COLACE) 100 MG capsule Take 100 mg by mouth 2 (two) times daily.    . dorzolamide-timolol (COSOPT) 22.3-6.8 MG/ML ophthalmic solution Place 1 drop into both eyes 2 (two) times daily.    Marland Kitchen FLUoxetine (PROZAC) 20 MG capsule Take 20 mg by mouth daily. Patient takes with 40 mg to equal 60 mg total dose    . FLUoxetine (PROZAC) 40 MG capsule Take 40 mg by mouth daily. Pt takes with 20 mg to equal 60 mg    . furosemide (LASIX) 40 MG tablet Take 40 mg by mouth daily.     Marland Kitchen guaiFENesin (MUCINEX) 600 MG 12 hr tablet Take 600 mg by mouth 2 (two) times daily.    Marland Kitchen ipratropium-albuterol (DUONEB) 0.5-2.5 (3) MG/3ML SOLN Take 3 mLs by nebulization every 8 (eight) hours.    Marland Kitchen levofloxacin (LEVAQUIN) 750 MG tablet Take 750 mg by mouth daily. For 7 days- started 02-08-17. Completed 02-14-17    . levothyroxine (SYNTHROID) 88 MCG  tablet Take 1 tablet (88 mcg total) by mouth daily before breakfast.    . LORazepam (ATIVAN) 0.5 MG tablet Take 0.75 mg by mouth daily.     Marland Kitchen. LORazepam (ATIVAN) 1 MG tablet Take 1 mg by mouth every morning.     . magnesium oxide (MAG-OX) 400 MG tablet Take 400 mg by mouth daily.     . meclizine (ANTIVERT) 25 MG tablet Take 25 mg by mouth daily as needed for dizziness.    . Multiple Vitamins-Minerals (CERTAVITE SENIOR/ANTIOXIDANT) TABS Take 1 tablet by mouth daily.    Marland Kitchen. OLANZapine (ZYPREXA) 5 MG tablet Take 5 mg by mouth daily.     Marland Kitchen. oxybutynin (DITROPAN) 5 MG tablet Take 5 mg by mouth 2 (two) times daily.     . pantoprazole (PROTONIX) 20 MG tablet Take 20 mg by mouth daily before breakfast. 1 hour before breakfast     . polyethylene glycol (MIRALAX / GLYCOLAX) packet Take 17 g by mouth daily.    . potassium chloride SA (K-DUR,KLOR-CON) 20 MEQ tablet Take 20 mEq by mouth daily. Reported on 04/04/2015    . senna-docusate (SENOKOT-S) 8.6-50 MG tablet Take 1 tablet by mouth daily.    Marland Kitchen. acetaminophen (TYLENOL) 500 MG tablet Take 500 mg by mouth every 6 (six) hours as needed  (BREAK THROUGH PAIN).     Marland Kitchen. albuterol (PROVENTIL) (2.5 MG/3ML) 0.083% nebulizer solution Take 3 mLs (2.5 mg total) by nebulization every 4 (four) hours as needed for wheezing or shortness of breath. 75 mL 12  . amLODipine (NORVASC) 2.5 MG tablet Take 2.5 mg by mouth daily.    Marland Kitchen. aspirin 325 MG EC tablet Take 325 mg by mouth daily.    . bisacodyl (DULCOLAX) 5 MG EC tablet Take 1 tablet (5 mg total) by mouth daily as needed for moderate constipation. 30 tablet 0  . ferrous sulfate 325 (65 FE) MG tablet Take 325 mg by mouth daily.     Marland Kitchen. HYDROcodone-acetaminophen (NORCO/VICODIN) 5-325 MG tablet Take 1 tablet by mouth every 6 (six) hours as needed. Hold if sleepy or confused     . nitroGLYCERIN (NITROSTAT) 0.4 MG SL tablet Place 0.4 mg under the tongue every 5 (five) minutes as needed for chest pain. If pain is unrelived after 3 doses, call MD    . OXYGEN Inhale into the lungs. Titrate 2-6 LPM to keep O2 stats > 93%      Objective: BP 139/64   Pulse 65   Temp 98.3 F (36.8 C) (Oral)   Resp (!) 21   Ht 5\' 10"  (1.778 m)   Wt 204 lb (92.5 kg)   SpO2 98%   BMI 29.27 kg/m  Exam: General: well nourished, well developed, NAD with non-toxic appearance HEENT: normocephalic, atraumatic, moist mucous membranes, PERRLA, EOMI Cardiovascular: regular rate and rhythm without murmurs, rubs, or gallops Lungs: clear to auscultation bilaterally with normal work of breathing on 1 L [place] Abdomen: soft, non-tender, non-distended, normoactive bowel sounds Skin: warm, dry, no rashes or lesions, cap refill < 2 seconds Extremities: warm and well perfused, normal tone, no edema, 5/5 motor strength in all 4 extremities Neuro: CNII-XII intact, no dysarthria Psych: dysthymic mood, congruent affect, non-tremulous, tearful when asked about late husband  Labs and Imaging: CBC BMET  Recent Labs  Lab 02/18/17 0804  WBC 10.0  HGB 13.2  HCT 42.2  PLT 369   Recent Labs  Lab 02/18/17 0804  NA 141  K 3.8  CL 107   CO2  22  BUN 7  CREATININE 0.95  GLUCOSE 126*  CALCIUM 9.4     INR: 1.04 (WNL) Blood culture x2: Pending BNP: 107.1 (149.3, 1 year ago) Troponin: Negative TSH: 4.407 (WNL) UA: Negative Urine culture: Pending HIV: Pending Strep pneumo urine antigen: Pending Legionella urine antigen: Pending Sputum culture: Pending  CT ANGIO CHEST PE W OR WO CONTRAST (02/18/17):  1. Focal airspace consolidation/atelectasis in the superior segment right lower lobe and posterior right lower lobe. 2. Negative for acute PE or thoracic aortic dissection. 3. Coronary and Aortic Atherosclerosis (ICD10-170.0) 4. Retained material in the proximal esophagus with wall thickening suspected in the mid and distal esophagus, possibly esophagitis versus mucosal lesion. Consider elective endoscopy or barium swallow preferably as an outpatient when the patient can cooperate, for further evaluation. 5. Cholelithiasis.  DG CHEST 2 VIEW (02/18/17):  Subsegmental atelectasis or early pneumonia in the right perihilar region superimposed upon chronic bronchitic changes. Followup PA and lateral chest X-ray is recommended in 3-4 weeks following trial of\ antibiotic therapy to ensure resolution and exclude underlying malignancy.  Wendee BeaversMcMullen, Zaryiah Barz J, DO 02/18/2017, 1:18 PM PGY-2, [facility] FPTS Intern pager: [phone], text pages welcome

## 2017-02-18 NOTE — ED Provider Notes (Signed)
[facility] Provider Note   CSN: [id number] Arrival date & time: 02/18/17  0636     History   Chief Complaint Chief Complaint  Patient presents with  . Fatigue   HPI   Debra Johnston is a 83 y.o. female [facility] with increasing confusion, fatigue over the course of the last 3 weeks.  She states that she has had shortness of breath with cough, no chest pain, increasing peripheral edema, fever chills, nausea vomiting, decreased p.o. intake, change in bowel or bladder habits.  Patient is nonambulatory at her baseline.  As per her daughter-in-law she has been becoming more confused and hallucinating since Thanksgiving.  She is reporting that she sees her dead husband who is on the floor at the nursing home.  Per EMS she has been on 3 courses of Levaquin for bronchitis/pneumonia.  No chronic oxygen requirements at SNF.  Patient is nonambulatory at her baseline.  Discussed with Lola who is her nurse [facility] who states that this patient has been treated for pneumonia 3 times in the last 6 weeks and she is confirmed that the medication that she has been on has been Levaquin every time.  She states that she is normally oriented and social but she has been more confused lethargic and hallucinating that her dad husband is there.  She normally does not need oxygen but she has been receiving supplemental oxygen and nebulizer treatments because of shortness of breath.   Past Medical History:  Diagnosis Date  . Anxiety   . Bronchitis 02/22/2011  . CHF (congestive heart failure) (HCC)    felt to have diastolic dysfunction with normal EF at 60%  . Chronic pain   . CKD (chronic kidney disease) stage 4, GFR 15-29 ml/min (HCC)   . Depression   . DOE (dyspnea on exertion)    chronic   . Falls   . Fibromyalgia    with chronic weakness  . GERD (gastroesophageal reflux disease)   . Glaucoma   . Hyperlipidemia   . Hypertension   .  Hypothyroidism   . Osteoarthritis   . PAF (paroxysmal atrial fibrillation) (HCC)    not felt to be a candidate for coumadin  . Physical deconditioning     Patient Active Problem List   Diagnosis Date Noted  . GAD (generalized anxiety disorder) 09/05/2015  . Urinary retention with incomplete bladder emptying 09/05/2015  . Anemia of chronic disease 09/05/2015  . Major depression, chronic 09/05/2015  . Hyperlipidemia 05/11/2015  . Obesity 05/11/2015  . Adjustment disorder with mixed anxiety and depressed mood 05/11/2015  . Other specified hypothyroidism 05/11/2015  . Iron deficiency 05/11/2015  . Intertrochanteric fracture of right hip (HCC) 05/11/2015  . Anemia 04/06/2015  . Protein-calorie malnutrition (HCC) 04/06/2015  . Preoperative cardiovascular examination   . Fracture, subtrochanteric, right femur, closed (HCC) 03/22/2015  . CKD (chronic kidney disease) stage 3, GFR 30-59 ml/min (HCC) 03/22/2015  . Oxygen dependent 03/22/2015  . Fibromyalgia affecting lower leg 03/16/2015  . Hypertensive heart disease with CHF (congestive heart failure) (HCC) 07/01/2012  . PAF (paroxysmal atrial fibrillation) (HCC) 06/19/2011  . Hypothyroidism 06/19/2011  . Chronic diastolic CHF (congestive heart failure) (HCC) 03/12/2011    Past Surgical History:  Procedure Laterality Date  . APPENDECTOMY     as a teenage  . CESAREAN SECTION    . CHOLECYSTECTOMY     pt denies having cholecystectomy  . FEMUR IM NAIL Right 03/23/2015  Procedure: INTRAMEDULLARY (IM) NAIL FEMORAL;  Surgeon: Durene Romans, MD;  Location: [facility]S;  Service: Orthopedics;  Laterality: Right;  . HEMORRHOID SURGERY    . KNEE SURGERY     bilateral  . LAPAROSCOPIC HYSTERECTOMY    . TRANSTHORACIC ECHOCARDIOGRAM  02/2011   EF 60% with diastolic dysfunction, high filling pressures    OB History    No data available       Home Medications    Prior to Admission medications   Medication Sig Start Date End Date Taking?  Authorizing Provider  atorvastatin (LIPITOR) 20 MG tablet Take 20 mg by mouth daily.    Yes [provider]  baclofen (LIORESAL) 10 MG tablet Take 10 mg by mouth at bedtime. Tablet at bedtime and every 8 hrs as needed   Yes [provider]  carvedilol (COREG) 25 MG tablet Take 25 mg by mouth 2 (two) times daily with a meal. Hold if sbp < 110 09/26/12  Yes [place], Peter M, MD  Cholecalciferol (VITAMIN D-3 PO) Take 1,000 Units by mouth daily.   Yes [provider]  docusate sodium (COLACE) 100 MG capsule Take 100 mg by mouth 2 (two) times daily.   Yes [provider]  dorzolamide-timolol (COSOPT) 22.3-6.8 MG/ML ophthalmic solution Place 1 drop into both eyes 2 (two) times daily.   Yes [provider]  FLUoxetine (PROZAC) 20 MG capsule Take 20 mg by mouth daily. Patient takes with 40 mg to equal 60 mg total dose   Yes [provider]  FLUoxetine (PROZAC) 40 MG capsule Take 40 mg by mouth daily. Pt takes with 20 mg to equal 60 mg   Yes [provider]  furosemide (LASIX) 40 MG tablet Take 40 mg by mouth daily.    Yes [provider]  guaiFENesin (MUCINEX) 600 MG 12 hr tablet Take 600 mg by mouth 2 (two) times daily.   Yes [provider]  ipratropium-albuterol (DUONEB) 0.5-2.5 (3) MG/3ML SOLN Take 3 mLs by nebulization every 8 (eight) hours.   Yes [provider]  levofloxacin (LEVAQUIN) 750 MG tablet Take 750 mg by mouth daily. For 7 days- started 02-08-17. Completed 02-14-17   Yes [provider]  levothyroxine (SYNTHROID) 88 MCG tablet Take 1 tablet (88 mcg total) by mouth daily before breakfast. 01/02/13  Yes [place], Peter M, MD  LORazepam (ATIVAN) 0.5 MG tablet Take 0.75 mg by mouth daily.    Yes [provider]  LORazepam (ATIVAN) 1 MG tablet Take 1 mg by mouth every morning.    Yes [provider]  magnesium oxide (MAG-OX) 400 MG tablet Take 400 mg by mouth daily.    Yes [provider]  meclizine (ANTIVERT) 25 MG tablet Take 25 mg by mouth daily as needed for dizziness.   Yes [provider]  Multiple Vitamins-Minerals (CERTAVITE SENIOR/ANTIOXIDANT) TABS Take 1 tablet by mouth daily.   Yes [provider]  OLANZapine (ZYPREXA) 5 MG tablet Take 5 mg by mouth daily.  11/03/13  Yes [provider]  oxybutynin (DITROPAN) 5 MG tablet Take 5 mg by mouth 2 (two) times daily.    Yes [provider]  pantoprazole (PROTONIX) 20 MG tablet Take 20 mg by mouth daily before breakfast. 1 hour before breakfast    Yes [provider]  polyethylene glycol (MIRALAX / GLYCOLAX) packet Take 17 g by mouth daily.   Yes [provider]  potassium chloride SA (K-DUR,KLOR-CON) 20 MEQ tablet Take 20 mEq by  mouth daily. Reported on 04/04/2015   Yes [provider]  senna-docusate (SENOKOT-S) 8.6-50 MG tablet Take 1 tablet by mouth daily.   Yes [provider]  acetaminophen (TYLENOL) 500 MG tablet Take 500 mg by mouth every 6 (six) hours as needed (BREAK THROUGH PAIN).     [provider]  albuterol (PROVENTIL) (2.5 MG/3ML) 0.083% nebulizer solution Take 3 mLs (2.5 mg total) by nebulization every 4 (four) hours as needed for wheezing or shortness of breath. 04/01/15   Richarda Overlie, MD  amLODipine (NORVASC) 2.5 MG tablet Take 2.5 mg by mouth daily.    [provider]  aspirin 325 MG EC tablet Take 325 mg by mouth daily.    [provider]  bisacodyl (DULCOLAX) 5 MG EC tablet Take 1 tablet (5 mg total) by mouth daily as needed for moderate constipation. 04/01/15   Richarda Overlie, MD  ferrous sulfate 325 (65 FE) MG tablet Take 325 mg by mouth daily.     [provider]  HYDROcodone-acetaminophen (NORCO/VICODIN) 5-325 MG tablet Take 1 tablet by mouth every 6 (six) hours as needed. Hold if sleepy or confused     [provider]  nitroGLYCERIN (NITROSTAT) 0.4 MG SL tablet Place 0.4 mg  under the tongue every 5 (five) minutes as needed for chest pain. If pain is unrelived after 3 doses, call MD    [provider]  OXYGEN Inhale into the lungs. Titrate 2-6 LPM to keep O2 stats > 93%    [provider]    Family History Family History  Problem Relation Age of Onset  . Heart failure Mother   . Heart attack Mother   . Stroke Father   . Lymphoma Brother   . Cancer Brother     Social History Social History   Tobacco Use  . Smoking status: Never Smoker  . Smokeless tobacco: Never Used  Substance Use Topics  . Alcohol use: No  . Drug use: No     Allergies   Penicillins   Review of Systems Review of Systems  A complete review of systems was obtained and all systems are negative except as noted in the HPI and PMH.   Physical Exam Updated Vital Signs BP 139/64   Pulse 65   Temp 98.3 F (36.8 C) (Oral)   Resp (!) 21   Ht 5\' 10"  (1.778 m)   Wt 92.5 kg (204 lb)   SpO2 98%   BMI 29.27 kg/m   Physical Exam  Constitutional: She is oriented to person, place, and time. She appears well-developed and well-nourished. No distress.  HENT:  Head: Normocephalic and atraumatic.  Mouth/Throat: Oropharynx is clear and moist.  Eyes: Conjunctivae and EOM are normal. Pupils are equal, round, and reactive to light.  Neck: Normal range of motion.  Cardiovascular: Normal rate, regular rhythm and intact distal pulses.  Pulmonary/Chest: Effort normal and breath sounds normal.  1 L via nasal cannula  Abdominal: Soft. There is no tenderness.  Musculoskeletal: Normal range of motion.  Neurological: She is alert and oriented to person, place, and time.  Skin: She is not diaphoretic.  Psychiatric: She has a normal mood and affect.  Nursing note and vitals reviewed.    ED Treatments / Results  Labs (all labs ordered are listed, but only abnormal results are displayed) Labs Reviewed  CBC WITH DIFFERENTIAL/PLATELET - Abnormal; Notable for the following  components:      Result Value   RDW 15.6 (*)  All other components within normal limits  COMPREHENSIVE METABOLIC PANEL - Abnormal; Notable for the following components:   Glucose, Bld 126 (*)    Albumin 3.4 (*)    GFR calc non Af Amer 52 (*)    All other components within normal limits  URINALYSIS, ROUTINE W REFLEX MICROSCOPIC - Abnormal; Notable for the following components:   APPearance HAZY (*)    All other components within normal limits  BRAIN NATRIURETIC PEPTIDE - Abnormal; Notable for the following components:   B Natriuretic Peptide 107.1 (*)    All other components within normal limits  CULTURE, BLOOD (ROUTINE X 2)  CULTURE, BLOOD (ROUTINE X 2)  URINE CULTURE  PROTIME-INR  TSH  TROPONIN I    EKG  EKG Interpretation  Date/Time:  Monday February 18 2017 06:43:42 EST Ventricular Rate:  85 PR Interval:    QRS Duration: 104 QT Interval:  383 QTC Calculation: 442 R Axis:   -62 Text Interpretation:  Sinus or ectopic atrial rhythm Artifact Inferolateral infarct, age indeterminate No significant change since last tracing Confirmed by Rochele Raring [phone]) on 02/18/2017 6:46:53 AM       Radiology Dg Chest 2 View  Result Date: 02/18/2017 CLINICAL DATA:  Cough and shortness of breath. History of CHF, chronic bronchitis on oxygen supplementation, chronic renal insufficiency, nonsmoker. EXAM: CHEST  2 VIEW COMPARISON:  Portable chest x-ray of March 28, 2015 FINDINGS: The left hemidiaphragm remains higher than the right. The interstitial markings are both lungs are mildly prominent but much improved over the previous study. There is patchy airspace disease in the right perihilar region. The cardiac silhouette is enlarged. The pulmonary vascularity is not engorged. There are degenerative changes of both shoulders. The visualized portions of the thoracic spine exhibit only minimal degenerative disc disease. IMPRESSION: Subsegmental atelectasis or early pneumonia in the right  perihilar region superimposed upon chronic bronchitic changes. Followup PA and lateral chest X-ray is recommended in 3-4 weeks following trial of antibiotic therapy to ensure resolution and exclude underlying malignancy. Electronically Signed   By: David  [place] M.D.   On: 02/18/2017 07:48   Ct Angio Chest Pe W And/or Wo Contrast  Result Date: 02/18/2017 CLINICAL DATA:  Altered level of consciousness, lethargy, history of pneumonia EXAM: CT ANGIOGRAPHY CHEST WITH CONTRAST TECHNIQUE: Multidetector CT imaging of the chest was performed using the standard protocol during bolus administration of intravenous contrast. Multiplanar CT image reconstructions and MIPs were obtained to evaluate the vascular anatomy. CONTRAST:  ISOVUE-370 IOPAMIDOL (ISOVUE-370) INJECTION 76% COMPARISON:  03/24/2015 FINDINGS: Cardiovascular: Satisfactory opacification of pulmonary arteries noted, and there is no evidence of pulmonary emboli. Scattered coronary calcifications. Heart size normal. Adequate contrast opacification of the thoracic aorta with no evidence of dissection, aneurysm, or stenosis. There is classic 3-vessel brachiocephalic arch anatomy without proximal stenosis. Scattered calcified plaque in the distal arch and descending thoracic aorta is walls in the visualized proximal abdominal aorta. Mediastinum/Nodes: No pericardial effusion. No hilar or mediastinal adenopathy. Retained Ingested material in the proximal esophagus. Mild circumferential wall thickening suspected in the distal mid and distal esophagus, incompletely distended. Lungs/Pleura: No pleural effusion. No pneumothorax. Focal consolidation/atelectasis In the superior segment right lower lobe and posterior right lower lobe. ground-glass opacities in the lingula and posterior right upper lobe. Upper Abdomen: Partially calcified small stones layer in the dependent aspect of the nondilated gallbladder. No acute findings. Musculoskeletal: No chest wall  abnormality. Degenerative change in the left shoulder. No acute or significant osseous findings. Review of the  MIP images confirms the above findings. IMPRESSION: 1. Focal airspace consolidation/atelectasis in the superior segment right lower lobe and posterior right lower lobe. 2. Negative for acute PE or thoracic aortic dissection. 3. Coronary and Aortic Atherosclerosis (ICD10-170.0) 4. Retained material in the proximal esophagus with wall thickening suspected in the mid and distal esophagus, possibly esophagitis versus mucosal lesion. Consider elective endoscopy or barium swallow preferably as an outpatient when the patient can cooperate, for further evaluation. 5. Cholelithiasis. Electronically Signed   By: Corlis Leak  Hassell M.D.   On: 02/18/2017 12:02    Procedures Procedures (including critical care time)  Medications Ordered in ED Medications  vancomycin (VANCOCIN) IVPB 750 mg/150 ml premix (not administered)  aztreonam (AZACTAM) 2 g in dextrose 5 % 50 mL IVPB (0 g Intravenous Stopped 02/18/17 1311)  vancomycin (VANCOCIN) 2,000 mg in sodium chloride 0.9 % 500 mL IVPB (0 mg Intravenous Stopped 02/18/17 1152)  iopamidol (ISOVUE-370) 76 % injection (100 mLs  Contrast Given 02/18/17 1100)     Initial Impression / Assessment and Plan / ED Course  I have reviewed the triage vital signs and the nursing notes.  Pertinent labs & imaging results that were available during my care of the patient were reviewed by me and considered in my medical decision making (see chart for details).     Vitals:   02/18/17 1059 02/18/17 1100 02/18/17 1145 02/18/17 1245  BP:  (!) 151/90 128/67 139/64  Pulse:  (!) 106 83 65  Resp:  15 14 (!) 21  Temp:      TempSrc:      SpO2: 97% 94% 100% 98%  Weight:      Height:        Debra Johnston is 83 y.o. female presenting with worsening confusion, visual hallucinations, fatigue and shortness of breath with new oxygen requirement.  Patient lives at SNF, she has been  treated for a pneumonia 3 times over the last 6 weeks with 3 separate Levaquin courses.  She continues to deteriorate.  On my exam patient is alert, oriented x3.   Chest x-ray consistent with a right-sided pneumonia.  Given her outpatient treatment failure she will need admission.  Discussed with [facility] hospitalist who states that he is not convinced that that pneumonia is the source of her hypoxia.  He recommends obtaining urinalysis and reconsulting them.  CTA redemonstrates again the right sided infiltrate.  Discussed with [facility] hospitalist who states that they do not admit for nursing home patients.  [facility]   Final Clinical Impressions(s) / ED Diagnoses   Final diagnoses:  HCAP (healthcare-associated pneumonia)  Hypoxemia requiring supplemental oxygen  Hallucinations    ED Discharge Orders    None       Lynetta Mareisciotta, Mardella Laymanicole, PA-C 02/18/17 1323    Little, Ambrose Finlandachel Morgan, MD 02/19/17 [phone]

## 2017-02-18 NOTE — ED Notes (Signed)
Patient transported to X-ray 

## 2017-02-18 NOTE — ED Notes (Signed)
Heart healthy lunch tray ordered for patient.  

## 2017-02-18 NOTE — Progress Notes (Signed)
Pharmacy Antibiotic Note  Debra GravelFrances R Johnston is a 83 y.o. female admitted on 02/18/2017 with altered mental status and fatigue. Pharmacy has been consulted for vancomycin dosing for pneumonia. Pt is afebrile and WBC is WNL. SCr is WNL. Pt had been on levaquin PTA.   Plan: Vancomycin 2gm IV x 1 then 750mg  IV Q12H F/u renal fxn, C&S, [facility] continuation of aztreonam or other gram negative coverage  Height: 5\' 10"  (177.8 cm) Weight: 204 lb (92.5 kg) IBW/kg (Calculated) : 68.5  Temp (24hrs), Avg:98.3 F (36.8 C), Min:98.3 F (36.8 C), Max:98.3 F (36.8 C)  No results for input(s): WBC, CREATININE, LATICACIDVEN, VANCOTROUGH, VANCOPEAK, VANCORANDOM, GENTTROUGH, GENTPEAK, GENTRANDOM, TOBRATROUGH, TOBRAPEAK, TOBRARND, AMIKACINPEAK, AMIKACINTROU, AMIKACIN in the last 168 hours.  CrCl cannot be calculated (Patient's most recent lab result is older than the maximum 21 days allowed.).    Allergies  Allergen Reactions  . Penicillins Swelling and Rash    Antimicrobials this admission: Vanc 12/31>> Aztreo x 1 12/31  Dose adjustments this admission: N/A  Microbiology results: Pending  Thank you for allowing pharmacy to be a part of this patient's care.  Tajai Suder, Drake LeachRachel Lynn 02/18/2017 8:46 AM

## 2017-02-18 NOTE — ED Notes (Signed)
Pt moved off the floor to 5C06 with out primary RN being notified or report being given to receiving RN

## 2017-02-18 NOTE — ED Triage Notes (Signed)
[facility] coming [facility]. Ems called out due to nurse stating pt being altered. Pt was diagnosised with pneumonia is on her 3 round of levaquin of antibiotics for the 3 weeks. After a couple of days of antibiotics pt feels fine then she starts to get worse again.  Hx dementia, alert to baseline. Per facility pt is more lethargic, harder to arouse, is not walking around this time.

## 2017-02-18 NOTE — Discharge Summary (Signed)
Family Medicine Teaching [facility] Discharge Summary  Patient name: Debra Johnston Medical record number: [id number] Date of birth: 14-Sep-1927 Age: 83 y.o. Gender: female Date of Admission: 02/18/2017  Date of Discharge: 02/22/2016 Admitting Physician: Wendee Beavers, DO  Primary Care Provider: [name], MD Consultants: none  Indication for Hospitalization: Healthcare acquired pneumonia  Discharge Diagnoses/Problem List:  RLL HAP Acute hypoxia AMS HFpEF CAD PAF Cholelithiasis GERD Proximal esophageal thickening CKD stage IV Hypertension Hypothyroidism Fibromyalgia  protein calorie malnutrition Dementia with visual hallucinations Depression and anxiety  Disposition: back to snf  Discharge Condition: Stable, improved  Discharge Exam: Gen: a&o x3, no acute distress, resting comfortably, eating breakfast, on room air Cv: rrr, no m/r/g, no LE edema Resp: improved crackles, very faint stirtorous breath sounds, comfortable work of breathing, on room air Abd: Non-tender, non-distended, soft, bs + Msk: able to move all four extremities Skin: warm, dry, intact, no rashes   Brief Hospital Course:  Debra Johnston is a 83 y.o. female presenting with fatigue, non-productive cough, SOB, and confusion found to have HAP on CTA after failing 3 courses of outpatient treatment for PNA. She is currently a BIBEMS from [facility]. PMH is significant for HFpEF, PAF, HTN, HLD, CKDIV, hypothyroidism, GERD, glaucoma, fibromyalgia, anxiety and depression.  Patient is a resident at [facility] with a history of 6 weeks of nonproductive cough, increased fatigue, and shortness of breath.  She completed 3 separate courses of Levaquin.  A nurse at her facility noted patient had increased confusion prompting transfer to ED.  Upon admission, patient was given oxygen but did not appear in respiratory distress.  CXR supported right sided pneumonia consistent with clinical presentation.  She  was started on aztreonam and vancomycin which improved her respiratory status considerably. On 1/3 she was transitioned back to room air and continued to do very well. She was discharged on 1/4 with an additional 3 doses of doxycycline to complete a 7 day course.  Patient was complaining of symptoms consistent with a yeast infection prior to dc. She was given one dose of fluconazole prior to dc. She was prescribed one dose as needed.  Issues for Follow Up:  1. Follow up respiratory status 2. Follow up compliance and effectiveness with antibiotics  Significant Procedures: none  Significant Labs and Imaging:  Recent Labs  Lab 02/21/17 0517 02/21/17 1428 02/22/17 0511  WBC 12.9* 13.0* 12.9*  HGB 13.2 12.3 12.5  HCT 42.8 39.2 40.8  PLT 321 347 332   Recent Labs  Lab 02/18/17 0804 02/19/17 0400 02/20/17 0827 02/20/17 1250 02/21/17 0517  NA 141 138 137 136 138  K 3.8 3.6 5.6* 3.8 3.8  CL 107 105 103 105 106  CO2 22 23 23 23 23   GLUCOSE 126* 110* 166* 123* 108*  BUN 7 9 11 10 11   CREATININE 0.95 0.87 0.76 0.70 0.71  CALCIUM 9.4 9.4 9.3 9.3 9.5  ALKPHOS 73  --   --   --   --   AST 30  --   --   --   --   ALT 20  --   --   --   --   ALBUMIN 3.4*  --   --   --   --    INR: 1.04 (WNL) Blood culture x2: Pending BNP: 107.1 (149.3, 1 year ago) Troponin: Negative TSH: 4.407 (WNL) UA: Negative Urine culture: Pending HIV: Pending Strep pneumo urine antigen: Pending Legionella urine antigen: Pending Sputum culture: Pending  CT ANGIO CHEST PE W OR WO CONTRAST (02/18/17):  1. Focal airspace consolidation/atelectasis in the superior segment right lower lobe and posterior right lower lobe. 2. Negative for acute PE or thoracic aortic dissection. 3. Coronary and Aortic Atherosclerosis (ICD10-170.0) 4. Retained material in the proximal esophagus with wall thickening suspected in the mid and distal esophagus, possibly esophagitis versus mucosal lesion. Consider elective endoscopy or  barium swallow preferably as an outpatient when the patient can cooperate, for further evaluation. 5. Cholelithiasis.  DG CHEST 2 VIEW (02/18/17):  Subsegmental atelectasis or early pneumonia in the right perihilar region superimposed upon chronic bronchitic changes. Followup PA and lateral chest X-ray is recommended in 3-4 weeks following trial of\ antibiotic therapy to ensure resolution and exclude underlying malignancy.   Results/Tests Pending at Time of Discharge:   Discharge Medications:  Allergies as of 02/22/2017      Reactions   Penicillins Swelling, Rash      Medication List    STOP taking these medications   levofloxacin 750 MG tablet Commonly known as:  LEVAQUIN     TAKE these medications   acetaminophen 500 MG tablet Commonly known as:  TYLENOL Take 500 mg by mouth every 6 (six) hours as needed (BREAK THROUGH PAIN).   albuterol (2.5 MG/3ML) 0.083% nebulizer solution Commonly known as:  PROVENTIL Take 3 mLs (2.5 mg total) by nebulization every 4 (four) hours as needed for wheezing or shortness of breath.   amLODipine 2.5 MG tablet Commonly known as:  NORVASC Take 2.5 mg by mouth daily.   aspirin 325 MG EC tablet Take 325 mg by mouth daily.   atorvastatin 20 MG tablet Commonly known as:  LIPITOR Take 20 mg by mouth daily.   baclofen 10 MG tablet Commonly known as:  LIORESAL Take 10 mg by mouth at bedtime. Tablet at bedtime and every 8 hrs as needed   bisacodyl 5 MG EC tablet Commonly known as:  DULCOLAX Take 1 tablet (5 mg total) by mouth daily as needed for moderate constipation.   carvedilol 25 MG tablet Commonly known as:  COREG Take 25 mg by mouth 2 (two) times daily with a meal. Hold if sbp < 110   CERTAVITE SENIOR/ANTIOXIDANT Tabs Take 1 tablet by mouth daily.   docusate sodium 100 MG capsule Commonly known as:  COLACE Take 100 mg by mouth 2 (two) times daily.   dorzolamide-timolol 22.3-6.8 MG/ML ophthalmic solution Commonly known as:   COSOPT Place 1 drop into both eyes 2 (two) times daily.   doxycycline 100 MG tablet Commonly known as:  VIBRA-TABS Take 1 tablet (100 mg total) by mouth every 12 (twelve) hours.   ferrous sulfate 325 (65 FE) MG tablet Take 325 mg by mouth daily.   fluconazole 150 MG tablet Commonly known as:  DIFLUCAN Take additional dose in three days if needed for symptoms of yeast infection   FLUoxetine 20 MG capsule Commonly known as:  PROZAC Take 20 mg by mouth daily. Patient takes with 40 mg to equal 60 mg total dose What changed:  Another medication with the same name was removed. Continue taking this medication, and follow the directions you see here.   furosemide 40 MG tablet Commonly known as:  LASIX Take 40 mg by mouth daily.   guaiFENesin 600 MG 12 hr tablet Commonly known as:  MUCINEX Take 600 mg by mouth 2 (two) times daily.   HYDROcodone-acetaminophen 5-325 MG tablet Commonly known as:  NORCO/VICODIN Take 1 tablet by mouth every 6 (six)  hours as needed. Hold if sleepy or confused   ipratropium-albuterol 0.5-2.5 (3) MG/3ML Soln Commonly known as:  DUONEB Take 3 mLs by nebulization every 8 (eight) hours.   levothyroxine 88 MCG tablet Commonly known as:  SYNTHROID Take 1 tablet (88 mcg total) by mouth daily before breakfast.   LORazepam 0.5 MG tablet Commonly known as:  ATIVAN Take 0.75 mg by mouth daily. What changed:  Another medication with the same name was removed. Continue taking this medication, and follow the directions you see here.   magnesium oxide 400 MG tablet Commonly known as:  MAG-OX Take 400 mg by mouth daily.   meclizine 25 MG tablet Commonly known as:  ANTIVERT Take 25 mg by mouth daily as needed for dizziness.   nitroGLYCERIN 0.4 MG SL tablet Commonly known as:  NITROSTAT Place 0.4 mg under the tongue every 5 (five) minutes as needed for chest pain. If pain is unrelived after 3 doses, call MD   OLANZapine 5 MG tablet Commonly known as:   ZYPREXA Take 5 mg by mouth daily.   oxybutynin 5 MG tablet Commonly known as:  DITROPAN Take 5 mg by mouth 2 (two) times daily.   OXYGEN Inhale into the lungs. Titrate 2-6 LPM to keep O2 stats > 93%   pantoprazole 20 MG tablet Commonly known as:  PROTONIX Take 20 mg by mouth daily before breakfast. 1 hour before breakfast   polyethylene glycol packet Commonly known as:  MIRALAX / GLYCOLAX Take 17 g by mouth daily.   potassium chloride SA 20 MEQ tablet Commonly known as:  K-DUR,KLOR-CON Take 20 mEq by mouth daily. Reported on 04/04/2015   senna-docusate 8.6-50 MG tablet Commonly known as:  Senokot-S Take 1 tablet by mouth daily.   VITAMIN D-3 PO Take 1,000 Units by mouth daily.       Discharge Instructions: Please refer to Patient Instructions section of EMR for full details.  Patient was counseled important signs and symptoms that should prompt return to medical care, changes in medications, dietary instructions, activity restrictions, and follow up appointments.   Follow-Up Appointments: Contact information for after-discharge care    Destination    [facility] .   Service:  Skilled Nursing Contact information: [address] [place] [id number] [phone]              Myrene BuddyFletcher, Santasia Rew, MD 02/22/2017, 12:45 PM PGY-1, [facility]

## 2017-02-19 ENCOUNTER — Other Ambulatory Visit: Payer: Self-pay

## 2017-02-19 DIAGNOSIS — J189 Pneumonia, unspecified organism: Secondary | ICD-10-CM

## 2017-02-19 HISTORY — DX: Pneumonia, unspecified organism: J18.9

## 2017-02-19 LAB — URINE CULTURE: Culture: NO GROWTH

## 2017-02-19 LAB — CBC
HEMATOCRIT: 39.4 % (ref 36.0–46.0)
Hemoglobin: 12.3 g/dL (ref 12.0–15.0)
MCH: 30.1 pg (ref 26.0–34.0)
MCHC: 31.2 g/dL (ref 30.0–36.0)
MCV: 96.6 fL (ref 78.0–100.0)
Platelets: 336 10*3/uL (ref 150–400)
RBC: 4.08 MIL/uL (ref 3.87–5.11)
RDW: 15.8 % — AB (ref 11.5–15.5)
WBC: 11.8 10*3/uL — ABNORMAL HIGH (ref 4.0–10.5)

## 2017-02-19 LAB — BASIC METABOLIC PANEL
Anion gap: 10 (ref 5–15)
BUN: 9 mg/dL (ref 6–20)
CHLORIDE: 105 mmol/L (ref 101–111)
CO2: 23 mmol/L (ref 22–32)
Calcium: 9.4 mg/dL (ref 8.9–10.3)
Creatinine, Ser: 0.87 mg/dL (ref 0.44–1.00)
GFR calc Af Amer: >60 mL/min (ref >60)
GFR calc non Af Amer: 58 mL/min — ABNORMAL LOW (ref >60)
GLUCOSE: 110 mg/dL — AB (ref 65–99)
POTASSIUM: 3.6 mmol/L (ref 3.5–5.1)
Sodium: 138 mmol/L (ref 135–145)

## 2017-02-19 LAB — HIV ANTIBODY (ROUTINE TESTING W REFLEX): HIV Screen 4th Generation wRfx: NONREACTIVE

## 2017-02-19 LAB — STREP PNEUMONIAE URINARY ANTIGEN: Strep Pneumo Urinary Antigen: NEGATIVE

## 2017-02-19 LAB — MRSA PCR SCREENING: MRSA by PCR: POSITIVE — AB

## 2017-02-19 MED ORDER — LORAZEPAM 0.5 MG PO TABS
0.5000 mg | ORAL_TABLET | Freq: Two times a day (BID) | ORAL | Status: DC | PRN
  Administered 2017-02-19 – 2017-02-22 (×7): 0.5 mg via ORAL
  Filled 2017-02-19 (×7): qty 1

## 2017-02-19 MED ORDER — CHLORHEXIDINE GLUCONATE CLOTH 2 % EX PADS
6.0000 | MEDICATED_PAD | Freq: Every day | CUTANEOUS | Status: DC
  Administered 2017-02-19 – 2017-02-22 (×4): 6 via TOPICAL

## 2017-02-19 MED ORDER — MUPIROCIN 2 % EX OINT
1.0000 "application " | TOPICAL_OINTMENT | Freq: Two times a day (BID) | CUTANEOUS | Status: DC
  Administered 2017-02-19 – 2017-02-22 (×7): 1 via NASAL
  Filled 2017-02-19: qty 22

## 2017-02-19 NOTE — Progress Notes (Signed)
Patient requested bedtime "anxiety" medication be added to her meds, if not already ordered.  Upon review of PTA medication list, Ativan 0.5 mg at bedtime was listed.  Family Medicine notified via Amion text page of patient request.  Patient's son and daughter-in-law visited earlier today and she and they were updated on her plan of care.

## 2017-02-19 NOTE — Progress Notes (Signed)
Pt's IV site was leaking while flushing to hang ABT. New IV inserted, and the ABT is infusing at this time.

## 2017-02-19 NOTE — Progress Notes (Signed)
Ativan prn med added by MD.  Patient updated on plan of care.

## 2017-02-19 NOTE — Progress Notes (Signed)
Patient having arrythmias this morning where her P wave drops and QRS widens to 0.14.  Heart rate = 90.  Patient at rest and denies pain.  Attending MD notified via Amion text page.

## 2017-02-19 NOTE — Evaluation (Signed)
Physical Therapy Evaluation Patient Details Name: Debra Johnston MRN: [id number] DOB: 1927-07-10 Today's Date: 02/19/2017   History of Present Illness  Debra Johnston a 84 y.o.femalepresenting with fatigue, non-productive cough, SOB, and confusion found to have HAP on CTA after failing 3 courses of outpatient treatment for PNA.She is currently a[facility]IBEMS from [facility].PMH is significant forHFpEF,PAF, HTN, HLD,CKDIV,hypothyroidism, GERD, glaucoma, fibromyalgia, anxiety and depression.Patient is a resident at [facility] with a history of 6 weeks of nonproductive cough, increased fatigue, and shortness of breath.  She completed 3 separate courses of Levaquin.  A nurse at her facility noted patient had increase confusion prompting transfer to ED.  Upon admission, patient was given oxygen but did not appear in respiratory distress.  CXR supported right sided pneumonia consistent with clinical presentation.   Clinical Impression  Pt admitted with above diagnosis. Pt currently with functional limitations due to the deficits listed below (see PT Problem List). Pt was able to roll with min guard assist with rail.  Max assist to come to EOB and could sit EOB for 5 min with min to max assist with posterior lean and dizziness with fatigue.  Pt needs continued SNF and would benefit from a trial of PT.   Pt will benefit from skilled PT to increase their independence and safety with mobility to allow discharge to the venue listed below.      Follow Up Recommendations SNF;Supervision/Assistance - 24 hour    Equipment Recommendations  None recommended by PT    Recommendations for Other Services       Precautions / Restrictions Precautions Precautions: Fall Restrictions Weight Bearing Restrictions: No      Mobility  Bed Mobility Overal bed mobility: Needs Assistance Bed Mobility: Supine to Sit;Rolling Rolling: Min guard   Supine to sit: Mod assist;Max assist;+2 for physical assistance     General bed mobility comments: Rolled left and right with rail to be cleaned without assist. Needed incr assist to come to EOB needing assist with LEs and elevation of trunk  Transfers Overall transfer level: Needs assistance               General transfer comment: unable  Ambulation/Gait                Stairs            Wheelchair Mobility    Modified Rankin (Stroke Patients Only)       Balance Overall balance assessment: Needs assistance Sitting-balance support: No upper extremity supported;Feet supported Sitting balance-Leahy Scale: Poor Sitting balance - Comments: relies on external support to sit needing min to max assist needing alot of assist to come to the EOB.                                      Pertinent Vitals/Pain Pain Assessment: No/denies pain    Home Living Family/patient expects to be discharged to:: Skilled nursing facility                 Additional Comments: Has been at [facility] and has not been walking per pt.      Prior Function Level of Independence: Needs assistance   Gait / Transfers Assistance Needed: States she doesn't walk or get OOB much.  ADL's / Homemaking Assistance Needed: total care         Hand Dominance   Dominant Hand: Right    Extremity/Trunk  Assessment   Upper Extremity Assessment Upper Extremity Assessment: Defer to OT evaluation    Lower Extremity Assessment Lower Extremity Assessment: RLE deficits/detail;LLE deficits/detail RLE Deficits / Details: contracture knee lacking full knee extension, strength grossly 2-/5 LLE Deficits / Details: contracture knee lacking full knee extension, strength grossly 2-/5    Cervical / Trunk Assessment Cervical / Trunk Assessment: Kyphotic  Communication   Communication: No difficulties  Cognition Arousal/Alertness: Awake/alert Behavior During Therapy: Flat affect Overall Cognitive Status: History of cognitive impairments - at  baseline                                        General Comments General comments (skin integrity, edema, etc.): Pt was soaked with urine on arrival to room.  cleaned pt and changed all her sheets.  Also asked nursing to replace purewick catheter.  Also applied barrier cream to pts' buttocks as it was red.      Exercises General Exercises - Lower Extremity Long Arc Quad: AROM;Both;15 reps;Seated   Assessment/Plan    PT Assessment Patient needs continued PT services  PT Problem List Decreased strength;Decreased activity tolerance;Decreased balance;Decreased mobility;Decreased knowledge of use of DME;Decreased safety awareness;Decreased knowledge of precautions       PT Treatment Interventions DME instruction;Therapeutic activities;Therapeutic exercise;Balance training;Functional mobility training;Patient/family education    PT Goals (Current goals can be found in the Care Plan section)  Acute Rehab PT Goals Patient Stated Goal: to go to nursing home PT Goal Formulation: With patient Time For Goal Achievement: 03/05/17 Potential to Achieve Goals: Good    Frequency Min 2X/week   Barriers to discharge Decreased caregiver support      Co-evaluation               AM-PAC PT "6 Clicks" Daily Activity  Outcome Measure Difficulty turning over in bed (including adjusting bedclothes, sheets and blankets)?: Unable Difficulty moving from lying on back to sitting on the side of the bed? : Unable Difficulty sitting down on and standing up from a chair with arms (e.g., wheelchair, bedside commode, etc,.)?: Unable Help needed moving to and from a bed to chair (including a wheelchair)?: Total Help needed walking in hospital room?: Total Help needed climbing 3-5 steps with a railing? : Total 6 Click Score: 6    End of Session Equipment Utilized During Treatment: Gait belt Activity Tolerance: Patient limited by fatigue Patient left: in bed;with call bell/phone within  reach;with bed alarm set Nurse Communication: Mobility status;Need for lift equipment PT Visit Diagnosis: Unsteadiness on feet (R26.81);Muscle weakness (generalized) (M62.81)    Time: [id number] PT Time Calculation (min) (ACUTE ONLY): 28 min   Charges:   PT Evaluation $PT Eval Moderate Complexity: 1 Mod PT Treatments $Self Care/Home Management: 8-22   PT G Codes:        Abella Shugart,PT Acute Rehabilitation [phone] (pager)   Berline Lopesawn F Addie Cederberg 02/19/2017, 2:53 PM

## 2017-02-19 NOTE — Plan of Care (Signed)
Pt. Coping well with plan of care.

## 2017-02-19 NOTE — Evaluation (Signed)
Occupational Therapy Evaluation Patient Details Name: Debra Johnston MRN: [id number] DOB: 12/22/1927 Today's Date: 02/19/2017    History of Present Illness Debra Johnston a 84 y.o.femalepresenting with fatigue, non-productive cough, SOB, and confusion found to have HAP on CTA after failing 3 courses of outpatient treatment for PNA.She is currently [facility].PMH is significant forHFpEF,PAF, HTN, HLD,CKDIV,hypothyroidism, GERD, glaucoma, fibromyalgia, anxiety and depression.Patient is a resident at [facility] with a history of 6 weeks of nonproductive cough, increased fatigue, and shortness of breath.  She completed 3 separate courses of Levaquin.  A nurse at her facility noted patient had increase confusion prompting transfer to ED.  Upon admission, patient was given oxygen but did not appear in respiratory distress.  CXR supported right sided pneumonia consistent with clinical presentation.    Clinical Impression   PTA, pt has been living at [facility] and requiring total assistance for dressing and bathing tasks although she does feed herself. She reports that she has been OOB via lift a few times in the past few years but not often. Pt does present with confusion to OT session and unable to state the name of her facility. She was able to complete bed level grooming tasks this session with set-up and roll in bed in preparation for improved functional mobility with min guard assist. Feel pt will benefit from rehabilitation at SNF post-acute D/C. All further OT needs can be met at next venue of care and pt demonstrates no further acute OT needs. OT will defer further treatment and assessment to SNF.      Follow Up Recommendations  SNF;Supervision/Assistance - 24 hour    Equipment Recommendations       Recommendations for Other Services       Precautions / Restrictions Precautions Precautions: Fall Restrictions Weight Bearing Restrictions: No      Mobility  Bed Mobility Overal bed mobility: Needs Assistance Bed Mobility: Rolling Rolling: Min guard   Supine to sit: Mod assist;Max assist;+2 for physical assistance     General bed mobility comments: Able to roll to B sides with min guard assist utilizing bed rail. Declined sitting at EOB.   Transfers Overall transfer level: Needs assistance               General transfer comment: unable    Balance Overall balance assessment: Needs assistance Sitting-balance support: No upper extremity supported;Feet supported Sitting balance-Leahy Scale: Poor Sitting balance - Comments: relies on external support to sit needing min to max assist needing alot of assist to come to the EOB.                                    ADL either performed or assessed with clinical judgement   ADL Overall ADL's : Needs assistance/impaired Eating/Feeding: Set up;Bed level   Grooming: Set up;Brushing hair;Bed level   Upper Body Bathing: Maximal assistance   Lower Body Bathing: Total assistance   Upper Body Dressing : Maximal assistance   Lower Body Dressing: Total assistance   Toilet Transfer: Total assistance   Toileting- Clothing Manipulation and Hygiene: Total assistance       Functional mobility during ADLs: Total assistance General ADL Comments: Reports that she has been lifted to wheelchair a few times since staying at SNF.      Vision   Vision Assessment?: No apparent visual deficits     Perception     Praxis  Pertinent Vitals/Pain Pain Assessment: Faces Faces Pain Scale: Hurts little more Pain Location: Grimacing with shoulder movement.  Pain Descriptors / Indicators: Aching;Discomfort;Sore Pain Intervention(s): Monitored during session;Limited activity within patient's tolerance;Repositioned     Hand Dominance Right   Extremity/Trunk Assessment Upper Extremity Assessment Upper Extremity Assessment: Generalized weakness;RUE deficits/detail;LUE  deficits/detail RUE Deficits / Details: Decreased shoulder AROM due to arthritis per pt with approximately 0-75 deg AROM with pain at end range. Decreased speed with opposition coordination testing.   LUE Deficits / Details: Reports wrist pain lately with tingling sensation and tender to the touch. Decreased fine motor coordination and unable to complete opposition testing (likely a combination of poor coordination and arthritic changes)   Lower Extremity Assessment Lower Extremity Assessment: Defer to PT evaluation RLE Deficits / Details: contracture knee lacking full knee extension, strength grossly 2-/5 LLE Deficits / Details: contracture knee lacking full knee extension, strength grossly 2-/5   Cervical / Trunk Assessment Cervical / Trunk Assessment: Kyphotic   Communication Communication Communication: No difficulties   Cognition Arousal/Alertness: Awake/alert Behavior During Therapy: Flat affect Overall Cognitive Status: History of cognitive impairments - at baseline                                 General Comments: Able to follow commands well and attend to conversation. Unable to state name of facility in which she lives.    General Comments  Pt was soaked with urine on arrival to room.  cleaned pt and changed all her sheets.  Also asked nursing to replace purewick catheter.  Also applied barrier cream to pts' buttocks as it was red.      Exercises Exercises: General Lower Extremity General Exercises - Lower Extremity Long Arc Quad: AROM;Both;15 reps;Seated   Shoulder Instructions      Home Living Family/patient expects to be discharged to:: Skilled nursing facility                                 Additional Comments: Has been at [facility] and has not been walking per pt.        Prior Functioning/Environment Level of Independence: Needs assistance  Gait / Transfers Assistance Needed: States she doesn't walk or get OOB much. ADL's /  Homemaking Assistance Needed: total care    Comments: Reports to OT that she has been out of bed miminally in the past 2 years.         OT Problem List: Decreased strength;Decreased activity tolerance;Decreased coordination;Decreased cognition;Decreased safety awareness;Decreased knowledge of use of DME or AE;Decreased knowledge of precautions;Decreased range of motion;Impaired UE functional use;Pain      OT Treatment/Interventions:      OT Goals(Current goals can be found in the care plan section) Acute Rehab OT Goals Patient Stated Goal: get rehab at SNF OT Goal Formulation: With patient  OT Frequency:     Barriers to D/C:            Co-evaluation              AM-PAC PT "6 Clicks" Daily Activity     Outcome Measure Help from another person eating meals?: A Little Help from another person taking care of personal grooming?: A Little Help from another person toileting, which includes using toliet, bedpan, or urinal?: Total Help from another person bathing (including washing, rinsing, drying)?: Total Help from another person  to put on and taking off regular upper body clothing?: A Lot Help from another person to put on and taking off regular lower body clothing?: Total 6 Click Score: 11   End of Session Equipment Utilized During Treatment: Oxygen Nurse Communication: Other (comment)(nurse tech request to check position of pure whick)  Activity Tolerance: Patient tolerated treatment well Patient left: in bed;with call bell/phone within reach;with bed alarm set  OT Visit Diagnosis: Other abnormalities of gait and mobility (R26.89);Muscle weakness (generalized) (M62.81);Other symptoms and signs involving cognitive function                Time: 2025-4270 OT Time Calculation (min): 14 min Charges:  OT General Charges $OT Visit: 1 Visit OT Evaluation $OT Eval Moderate Complexity: 1 Mod G-Codes:     Norman Herrlich, MS OTR/L  Pager: [place] A  Kabrea Seeney 02/19/2017, 3:24 PM

## 2017-02-19 NOTE — Progress Notes (Signed)
Family Medicine Teaching Service Daily Progress Note Intern Pager: [phone]  Patient name: Debra Johnston Medical record number: [id number] Date of birth: 05-Feb-1928 Age: 84 y.o. Gender: female  Primary Care Provider: [name], MD Consultants: None Code Status: Full (confirmed on admission)  Pt Overview and Major Events to Date:  12/31: Admit for HAP  Assessment and Plan: Debra Johnston is a 84 y.o. female presenting with fatigue, non-productive cough, SOB, and confusion found to have HAP on CTA after failing 3 courses of outpatient treatment for PNA. She is currently a BIBEMS from  place. PMH is significant for HFpEF, PAF, HTN, HLD, CKDIV, hypothyroidism, GERD, glaucoma, fibromyalgia, anxiety and depression.  Healthcare acquired pneumonia  Hypoxia  AMS: Acute.  Patient continues to require 2 L of oxygen.  Does not use oxygen at home.  Cough and fatigue improving.  Remains afebrile.  No signs of ACS, CHF exacerbation PE, or aortic dissection negative CTA. - Telemetry - Day 2 of vancomycin and aztreonam given penicillin allergy, will need to transition to oral antibiotics - Continuous pulse ox - Pending blood cultures, sputum stain and culture Legionella and strep pneumonia urine antigen - Supplemental oxygen, keep sat greater than 88%, incentive spirometry - Albuterol nebulizer every 4 hours as needed for wheezing or shortness of breath - Monitor fever curve and WBC trend  HFpEF  CAD  PAF: Chronic.  Stable.  Last echo EF 55-60%, moderate concentric hypertrophy with severe septal hypertrophy, no wall motion abnormality, mildly dilated aortic root as of 03/22/2015.  Sees Dr. Swaziland for cardiology management.  BNP and negative troponin on admission.  Was previously on Coumadin for PAF but discontinued due to history of falls.  EKG NSR. - Telemetry  Cholelithiasis: Incidental finding on CTA.  Patient remains asymptomatic. - Consider outpatient EGD  The GERD  proximal  esophageal thickening: Incidental finding on CTA.  Does have history of reflux. - Continue Protonix 20 mg daily, see plan above for outpatient EGD  CKD stage IV: Creatinine remains at baseline. - Avoid nephrotoxic agents - Continue Lasix 40 mg daily and K-Dur daily  HTN: Chronic.  Stable.  Remains normotensive. -Continue home amlodipine 2.5 mg daily, Coreg 25 mg twice daily, aspirin 325 daily, Lipitor 20 mg daily  Hypothyroidism: Chronic.  Stable.  TSH WNL on admission. - Continue Synthroid 88 mcg daily  Fibromyalgia: Chronic.  Stable. - Continue baclofen 10 mg prior to bedtime  Protein calorie malnutrition: Unspecified severity.  Patient chronically bedbound with multiple core morbidities. - Dietary consult pending  Morbid obesity  H/o falls: Chronic periods significant deconditioning.  Completed by chronic bedbound state. - PT/OT eval pending - CSW for SNF placement  Dementia with visual hallucinations  depression and anxiety: Patient pleasantly demented without active visual hallucinations. -Continue home olanzapine 5 mg daily and Prozac 40 mg daily  FEN/GI: Heart healthy diet Prophylaxis: Lovenox SQ  Disposition: Pending improvement of hypoxia and transition to oral antibiotics.  Subjective:  Patient says she is doing well this morning. Feels less fatigued with improved cough. Denies fevers or chills, nausea or vomiting, CP, SOB. No other concerns.  Objective: Temp:  [98.2 F (36.8 C)-99.2 F (37.3 C)] 98.4 F (36.9 C) (01/01 0326) Pulse Rate:  [64-106] 102 (01/01 0326) Resp:  [14-27] 18 (01/01 0326) BP: (115-155)/(42-90) 130/69 (01/01 0326) SpO2:  [93 %-100 %] 99 % (01/01 0326) Weight:  [207 lb 14.3 oz (94.3 kg)-210 lb 8.6 oz (95.5 kg)] 207 lb 14.3 oz (94.3 kg) (01/01 0326) Physical Exam:  General: obese, NAD with non-toxic appearance HEENT: normocephalic, atraumatic, moist mucous membranes Neck: supple, non-tender without lymphadenopathy Cardiovascular:  regular rate and rhythm without murmurs, rubs, or gallops Lungs: clear to auscultation bilaterally with normal work of breathing on 2 L [place] Abdomen: soft, non-tender, non-distended, normoactive bowel sounds Skin: warm, dry, no rashes or lesions, cap refill < 2 seconds Extremities: warm and well perfused, normal tone, no edema Psych: euthymic mood, congruent affect Neuro: A&Ox4, grossly intact throughout  Laboratory: Recent Labs  Lab 02/18/17 0804 02/19/17 0400  WBC 10.0 11.8*  HGB 13.2 12.3  HCT 42.2 39.4  PLT 369 336   Recent Labs  Lab 02/18/17 0804 02/19/17 0400  NA 141 138  K 3.8 3.6  CL 107 105  CO2 22 23  BUN 7 9  CREATININE 0.95 0.87  CALCIUM 9.4 9.4  PROT 6.5  --   BILITOT 0.6  --   ALKPHOS 73  --   ALT 20  --   AST 30  --   GLUCOSE 126* 110*   INR: 1.04 (WNL) Blood culture x2: Pending BNP: 107.1 (149.3, 1 year ago) Troponin: Negative TSH: 4.407 (WNL) UA: Negative Urine culture: Pending HIV: Non-reactive Strep pneumo urine antigen: Pending Legionella urine antigen: Pending Sputum culture: Pending  Imaging/Diagnostic Tests: CT ANGIO CHEST PE W OR WO CONTRAST (02/18/17):  1. Focal airspace consolidation/atelectasis in the superior segment right lower lobe and posterior right lower lobe. 2. Negative for acute PE or thoracic aortic dissection. 3. Coronary and Aortic Atherosclerosis (ICD10-170.0) 4. Retained material in the proximal esophagus with wall thickening suspected in the mid and distal esophagus, possibly esophagitis versus mucosal lesion. Consider elective endoscopy or barium swallow preferably as an outpatient when the patient can cooperate, for further evaluation. 5. Cholelithiasis.  DG CHEST 2 VIEW (02/18/17):  Subsegmental atelectasis or early pneumonia in the right perihilar region superimposed upon chronic bronchitic changes. Followup PA and lateral chest X-ray is recommended in 3-4 weeks following trial of\ antibiotic therapy to ensure  resolution and exclude underlying malignancy.    Wendee BeaversMcMullen, Ivadell Gaul J, DO 02/19/2017, 8:43 AM PGY-2,  Family Medicine FPTS Intern pager: [phone], text pages welcome

## 2017-02-20 ENCOUNTER — Encounter (HOSPITAL_COMMUNITY): Payer: Self-pay | Admitting: General Practice

## 2017-02-20 ENCOUNTER — Other Ambulatory Visit: Payer: Self-pay

## 2017-02-20 DIAGNOSIS — R443 Hallucinations, unspecified: Secondary | ICD-10-CM

## 2017-02-20 DIAGNOSIS — Z9981 Dependence on supplemental oxygen: Secondary | ICD-10-CM

## 2017-02-20 DIAGNOSIS — R0902 Hypoxemia: Secondary | ICD-10-CM

## 2017-02-20 LAB — BASIC METABOLIC PANEL
ANION GAP: 11 (ref 5–15)
ANION GAP: 8 (ref 5–15)
BUN: 11 mg/dL (ref 6–20)
BUN: 10 mg/dL (ref 6–20)
CHLORIDE: 103 mmol/L (ref 101–111)
CHLORIDE: 105 mmol/L (ref 101–111)
CO2: 23 mmol/L (ref 22–32)
CO2: 23 mmol/L (ref 22–32)
Calcium: 9.3 mg/dL (ref 8.9–10.3)
Calcium: 9.3 mg/dL (ref 8.9–10.3)
Creatinine, Ser: 0.76 mg/dL (ref 0.44–1.00)
Creatinine, Ser: 0.7 mg/dL (ref 0.44–1.00)
GFR calc Af Amer: >60 mL/min (ref >60)
GFR calc Af Amer: >60 mL/min (ref >60)
GLUCOSE: 166 mg/dL — AB (ref 65–99)
GLUCOSE: 123 mg/dL — AB (ref 65–99)
POTASSIUM: 5.6 mmol/L — AB (ref 3.5–5.1)
POTASSIUM: 3.8 mmol/L (ref 3.5–5.1)
Sodium: 137 mmol/L (ref 135–145)
Sodium: 136 mmol/L (ref 135–145)

## 2017-02-20 LAB — LEGIONELLA PNEUMOPHILA SEROGP 1 UR AG: L. pneumophila Serogp 1 Ur Ag: NEGATIVE

## 2017-02-20 LAB — CBC
HEMATOCRIT: 41 % (ref 36.0–46.0)
Hemoglobin: 12.9 g/dL (ref 12.0–15.0)
MCH: 30.1 pg (ref 26.0–34.0)
MCHC: 31.5 g/dL (ref 30.0–36.0)
MCV: 95.8 fL (ref 78.0–100.0)
Platelets: 333 10*3/uL (ref 150–400)
RBC: 4.28 MIL/uL (ref 3.87–5.11)
RDW: 15.6 % — ABNORMAL HIGH (ref 11.5–15.5)
WBC: 11 10*3/uL — AB (ref 4.0–10.5)

## 2017-02-20 MED ORDER — ADULT MULTIVITAMIN W/MINERALS CH
1.0000 | ORAL_TABLET | Freq: Every day | ORAL | Status: DC
  Administered 2017-02-20: 1 via ORAL
  Filled 2017-02-20 (×2): qty 1

## 2017-02-20 MED ORDER — DOXYCYCLINE HYCLATE 100 MG PO TABS
100.0000 mg | ORAL_TABLET | Freq: Two times a day (BID) | ORAL | Status: DC
  Administered 2017-02-20 – 2017-02-21 (×3): 100 mg via ORAL
  Filled 2017-02-20 (×3): qty 1

## 2017-02-20 MED ORDER — IPRATROPIUM-ALBUTEROL 0.5-2.5 (3) MG/3ML IN SOLN: 3.0000 mL | Freq: Four times a day (QID) | RESPIRATORY_TRACT | Status: DC | PRN

## 2017-02-20 NOTE — Clinical Social Work Note (Signed)
Clinical Social Work Assessment  Patient Details  Name: Debra Johnston MRN: [id number] Date of Birth: 1927-07-02  Date of referral:  02/20/17               Reason for consult:  Discharge Planning                Permission sought to share information with:  Chartered certified accountant granted to share information::  Yes, Verbal Permission Granted  Name::        Agency::  [facility]  Relationship::     Contact Information:     Housing/Transportation Living arrangements for the past 2 months:  [place] of Information:  Patient, Medical Team Patient Interpreter Needed:  None Criminal Activity/Legal Involvement Pertinent to Current Situation/Hospitalization:  No - Comment as needed Significant Relationships:  Adult Children Lives with:  Facility Resident Do you feel safe going back to the place where you live?  Yes Need for family participation in patient care:  Yes (Comment)  Care giving concerns:  Patient is a long-term resident at [facility].   Social Worker assessment / plan:  CSW met with patient. No supports at bedside. CSW introduced role and explained that discharge planning would be discussed. Patient confirmed she was admitted from [facility] and plans to return once stable for discharge. She states she has been there for almost two years. She is agreeable to getting more therapy if insurance approves it. No further concerns. CSW encouraged patient to contact CSW as needed. CSW will continue to follow patient for support and facilitate discharge to SNF once medically stable.  Employment status:  Retired Nurse, adult PT Recommendations:  [place] / Referral to community resources:  [place]  Patient/Family's Response to care:  Patient agreeable to return to SNF. Patient's son supportive and involved in patient's care. Patient appreciated social work  intervention.  Patient/Family's Understanding of and Emotional Response to Diagnosis, Current Treatment, and Prognosis:  Patient has a good understanding of the reason for admission and plan to return to SNF once medically stable for discharge. Patient appears happy with hospital care.  Emotional Assessment Appearance:  Appears stated age Attitude/Demeanor/Rapport:  Other(Pleasant) Affect (typically observed):  Accepting, Appropriate, Calm, Pleasant Orientation:  Oriented to Self, Oriented to Place, Oriented to  Time, Oriented to Situation Alcohol / Substance use:  Never Used Psych involvement (Current and /or in the community):  No (Comment)  Discharge Needs  Concerns to be addressed:  Care Coordination Readmission within the last 30 days:  No Current discharge risk:  Dependent with Mobility Barriers to Discharge:  Continued Medical Work up, [place], LCSW 02/20/2017, 10:13 AM

## 2017-02-20 NOTE — Progress Notes (Addendum)
Initial Nutrition Assessment  DOCUMENTATION CODES:   Not applicable  INTERVENTION:   -MVI daily  NUTRITION DIAGNOSIS:   Increased nutrient needs related to acute illness(HCAP) as evidenced by estimated needs.  GOAL:   Patient will meet greater than or equal to 90% of their needs  MONITOR:   PO intake, Supplement acceptance, Labs, Weight trends, Skin, I & O's  REASON FOR ASSESSMENT:   Consult Assessment of nutrition requirement/status  ASSESSMENT:   Debra Johnston is a 84 y.o. female presenting with fatigue, non-productive cough, SOB, and confusion found to have HAP on CTA after failing 3 courses of outpatient treatment for PNA. She is currently a BIBEMS from [facility]. PMH is significant for HFpEF, PAF, HTN, HLD, CKDIV, hypothyroidism, GERD, glaucoma, fibromyalgia, anxiety and depression.  Pt admitted with RLL HCAP.   Spoke with pt at bedside, who reports that she has been a resident at [facility] for approximately 2 years. Pt reports good appetite both currently and PTA. Pt reports she eats well, but typically does not eat all of her food at meals; she estimates pt consumes about 75% of meals on average. Noted meal completion 50-75%.   Wt has been stable over the past year. Noted UBW 200#. Per pt, she lost "a little weight" several years ago, but has since gained it back.   Due to pt's total care and bedbound status, pt is at increased risk for skin breakdown. Will add MVI.   Albumin has a half-life of 21 days and is strongly affected by stress response and inflammatory process, therefore, do not expect to see an improvement in this lab value during acute hospitalization. When a patient presents with low albumin, it is likely skewed due to the acute inflammatory response.  Unless it is suspected that patient had poor PO intake or malnutrition prior to admission, then RD should not be consulted solely for low albumin. Note that low albumin is no longer used to diagnose  malnutrition; [facility] uses the new malnutrition guidelines published by the American Society for Parenteral and Enteral Nutrition (A.S.P.E.N.) and the Academy of Nutrition and Dietetics (AND).    Per CSW notes, plan to d/c back to SNF once medically stable.   Labs reviewed: K: 5.6.   NUTRITION - FOCUSED PHYSICAL EXAM:    Most Recent Value  Orbital Region  No depletion  Upper Arm Region  Mild depletion  Thoracic and Lumbar Region  No depletion  Buccal Region  No depletion  Temple Region  No depletion  Clavicle Bone Region  No depletion  Clavicle and Acromion Bone Region  No depletion  Scapular Bone Region  No depletion  Dorsal Hand  No depletion  Patellar Region  No depletion  Anterior Thigh Region  No depletion  Posterior Calf Region  No depletion  Edema (RD Assessment)  None  Hair  Reviewed  Eyes  Reviewed  Mouth  Reviewed  Skin  Reviewed  Nails  Reviewed       Diet Order:  Diet Heart Room service appropriate? Yes; Fluid consistency: Thin  EDUCATION NEEDS:   Education needs have been addressed  Skin:  Skin Assessment: Skin Integrity Issues: Skin Integrity Issues:: Other (Comment) Other: MASD on bilateral breast, buttocks, groin  Last BM:  02/20/17  Height:   Ht Readings from Last 1 Encounters:  02/18/17 5\' 10"  (1.778 m)    Weight:   Wt Readings from Last 1 Encounters:  02/20/17 203 lb 11.3 oz (92.4 kg)    Ideal  Body Weight:  72.7 kg  BMI:  Body mass index is 29.23 kg/m.  Estimated Nutritional Needs:   Kcal:  1600-1800  Protein:  80-95 grams  Fluid:  1.6-1.8 L    Kearney Evitt A. Mayford Knife, RD, LDN, CDE Pager: [phone] After hours Pager: [phone]

## 2017-02-20 NOTE — Progress Notes (Signed)
Family Medicine Teaching Service Daily Progress Note Intern Pager: [phone]  Patient name: Debra GravelFrances R Johnston Medical record number: [id number] Date of birth: 1927/03/16 Age: 84 y.o. Gender: female  Primary Care Provider: Oneal GroutPandey, Mahima, MD Consultants: None Code Status: Full (confirmed on admission)  Pt Overview and Major Events to Date:  12/31: Admit for HAP  Assessment and Plan: Debra GravelFrances R Ciliberto is a 84 y.o. female presenting with fatigue, non-productive cough, SOB, and confusion found to have HAP on CTA after failing 3 courses of outpatient treatment for PNA. She is currently a [facility]S from [facility]. PMH is significant for HFpEF, PAF, HTN, HLD, CKDIV, hypothyroidism, GERD, glaucoma, fibromyalgia, anxiety and depression.  Healthcare acquired pneumonia: Acute. Patient continues to require 2 L of oxygen.  Does not use oxygen at home.  Cough and fatigue improving.  Remains afebrile.  No signs of ACS, CHF exacerbation PE, or aortic dissection negative CTA. - Day 3 of vancomycin and aztreonam given penicillin allergy (12/31 - ) >> plan to transition to PO abx when BCx result 2/2 neg - Continuous pulse ox - Pending blood cultures, sputum stain and culture Legionella and strep pneumonia urine antigen - Supplemental oxygen, keep sat greater than 88%,wean as tolerated - Albuterol nebulizer every 4 hours as needed for wheezing or shortness of breath - Monitor fever curve and WBC trend  Hyperkalemia in a hemolyzed sample - K elevated at 5.6 on today's labs. Noted to be hemolyzed. Patient has been receiving 20 mEq potassium with lasix. Will reorder BMP and plan to hold PM KDUR if it is true hyperkalemia.  AMS  - in setting of HAP on admission. Now resolved.  HFpEF  CAD  PAF: Chronic.  Stable.  Last echo EF 55-60%, moderate concentric hypertrophy with severe septal hypertrophy, no wall motion abnormality, mildly dilated aortic root as of 03/22/2015.  Sees Dr. SwazilandJordan for cardiology management.  BNP  and negative troponin on admission.  Was previously on Coumadin for PAF but discontinued due to history of falls.  EKG NSR. - DC telemetry  Cholelithiasis: Incidental finding on CTA.  Patient remains asymptomatic. - Consider outpatient EGD  The GERD  proximal esophageal thickening: Incidental finding on CTA.  Does have history of reflux. - Continue Protonix 20 mg daily, see plan above for outpatient EGD  CKD stage IV: Creatinine remains at baseline. - Avoid nephrotoxic agents - Continue Lasix 40 mg daily and K-Dur daily  HTN: Chronic.  Stable.  Remains normotensive. -Continue home amlodipine 2.5 mg daily, Coreg 25 mg twice daily, aspirin 325 daily, Lipitor 20 mg daily  Hypothyroidism: Chronic.  Stable.  TSH WNL on admission. - Continue Synthroid 88 mcg daily  Fibromyalgia: Chronic.  Stable. - Continue baclofen 10 mg prior to bedtime  Protein calorie malnutrition: Unspecified severity.  Patient chronically bedbound with multiple core morbidities. - Dietary consult pending  Morbid obesity  H/o falls: Chronic periods significant deconditioning.  Completed by chronic bedbound state. - PT/OT eval pending - CSW for SNF placement  Dementia with visual hallucinations  depression and anxiety: Patient pleasantly demented without active visual hallucinations. -Continue home olanzapine 5 mg daily and Prozac 40 mg daily  FEN/GI: Heart healthy diet Prophylaxis: Lovenox SQ  Disposition: Pending improvement of hypoxia and transition to oral antibiotics.  Subjective:  Patient says she is doing well this AM with no acute events overnight. She is oriented to person place and time. She endorses mild shortness of breath and generalized weakness but no chest pain.  Objective: Temp:  [  98.2 F (36.8 C)-98.5 F (36.9 C)] 98.5 F (36.9 C) (01/02 0557) Pulse Rate:  [65-86] 65 (01/02 0557) Resp:  [18] 18 (01/02 0557) BP: (136-149)/(44-67) 136/44 (01/02 0557) SpO2:  [93 %-99 %] 98 % (01/02  0917) Weight:  [203 lb 11.3 oz (92.4 kg)] 203 lb 11.3 oz (92.4 kg) (01/02 0557) Physical Exam: Gen: AAOx3, NAD CV: RRR, no m/r/g, no LE edema Resp: +mild diffuse crackles, +diminished breath sounds in left mid and lower lung fields, +diminished breath sounds bil bases. No wheezes. Comfortable work of breathing, good effort Abd: Nt, ND, soft MSK:  Moves 4 extremities equally Skin: warm, dry, intact, no rashes  Laboratory: Recent Labs  Lab 02/18/17 0804 02/19/17 0400  WBC 10.0 11.8*  HGB 13.2 12.3  HCT 42.2 39.4  PLT 369 336   Recent Labs  Lab 02/18/17 0804 02/19/17 0400  NA 141 138  K 3.8 3.6  CL 107 105  CO2 22 23  BUN 7 9  CREATININE 0.95 0.87  CALCIUM 9.4 9.4  PROT 6.5  --   BILITOT 0.6  --   ALKPHOS 73  --   ALT 20  --   AST 30  --   GLUCOSE 126* 110*   INR: 1.04 (WNL) Blood culture x2: Pending BNP: 107.1 (149.3, 1 year ago) Troponin: Negative TSH: 4.407 (WNL) UA: Negative Urine culture: Pending HIV: Non-reactive Strep pneumo urine antigen: Negative Legionella urine antigen: Pending Sputum culture: Pending  Imaging/Diagnostic Tests: CT ANGIO CHEST PE W OR WO CONTRAST (02/18/17):  1. Focal airspace consolidation/atelectasis in the superior segment right lower lobe and posterior right lower lobe. 2. Negative for acute PE or thoracic aortic dissection. 3. Coronary and Aortic Atherosclerosis (ICD10-170.0) 4. Retained material in the proximal esophagus with wall thickening suspected in the mid and distal esophagus, possibly esophagitis versus mucosal lesion. Consider elective endoscopy or barium swallow preferably as an outpatient when the patient can cooperate, for further evaluation. 5. Cholelithiasis.  DG CHEST 2 VIEW (02/18/17):  Subsegmental atelectasis or early pneumonia in the right perihilar region superimposed upon chronic bronchitic changes. Followup PA and lateral chest X-ray is recommended in 3-4 weeks following trial of\ antibiotic therapy to  ensure resolution and exclude underlying malignancy.  Howard Pouch, MD 02/20/2017, 9:20 AM PGY-2, [place] [facility] FPTS Intern pager: [phone], text pages welcome

## 2017-02-20 NOTE — Clinical Social Work Note (Signed)
Per MD, patient will likely discharge back to SNF tomorrow. CSW [facility]ed clinicals to [facility] for authorization review.  Debra CourtSarah Krystal Delduca, CSW [phone]

## 2017-02-20 NOTE — NC FL2 (Signed)
[place] MEDICAID FL2 LEVEL OF CARE SCREENING TOOL     IDENTIFICATION  Patient Name: Debra Johnston Birthdate: 1927/04/05 Sex: female Admission Date (Current Location): 02/18/2017  [facility] and [place] Number:  Producer, television/film/video and Address:  The . [facility], [address] [place], [place] [address]      Provider Number: [id number]  Attending Physician Name and Address:  Carney Living, MD  Relative Name and Phone Number:       Current Level of Care: Hospital Recommended Level of Care: Skilled Nursing Facility Prior Approval Number:    Date Approved/Denied:   PASRR Number: [id number]  Discharge Plan: SNF    Current Diagnoses: Patient Active Problem List   Diagnosis Date Noted  . HCAP (healthcare-associated pneumonia) 02/18/2017  . GAD (generalized anxiety disorder) 09/05/2015  . Urinary retention with incomplete bladder emptying 09/05/2015  . Anemia of chronic disease 09/05/2015  . Major depression, chronic 09/05/2015  . Hyperlipidemia 05/11/2015  . Obesity 05/11/2015  . Adjustment disorder with mixed anxiety and depressed mood 05/11/2015  . Other specified hypothyroidism 05/11/2015  . Iron deficiency 05/11/2015  . Intertrochanteric fracture of right hip (HCC) 05/11/2015  . Anemia 04/06/2015  . Protein-calorie malnutrition (HCC) 04/06/2015  . Preoperative cardiovascular examination   . Fracture, subtrochanteric, right femur, closed (HCC) 03/22/2015  . CKD (chronic kidney disease) stage 3, GFR 30-59 ml/min (HCC) 03/22/2015  . Oxygen dependent 03/22/2015  . Fibromyalgia affecting lower leg 03/16/2015  . Hypertensive heart disease with CHF (congestive heart failure) (HCC) 07/01/2012  . PAF (paroxysmal atrial fibrillation) (HCC) 06/19/2011  . Hypothyroidism 06/19/2011  . Chronic diastolic CHF (congestive heart failure) (HCC) 03/12/2011    Orientation RESPIRATION BLADDER Height & Weight     Self, Time, Situation, Place  O2(Nasal Canula 2 L) Incontinent Weight: 203 lb 11.3 oz (92.4 kg) Height:  5\' 10"  (177.8 cm)  BEHAVIORAL SYMPTOMS/MOOD NEUROLOGICAL BOWEL NUTRITION STATUS  (None) (None) Continent Diet(Heart healthy)  AMBULATORY STATUS COMMUNICATION OF NEEDS Skin   Extensive Assist Verbally Bruising, Other (Comment)(MASD.)                       Personal Care Assistance Level of Assistance  Bathing, Feeding, Dressing Bathing Assistance: Maximum assistance Feeding assistance: Limited assistance Dressing Assistance: Maximum assistance     Functional Limitations Info  Sight, Hearing, Speech Sight Info: Adequate Hearing Info: Adequate Speech Info: Adequate    SPECIAL CARE FACTORS FREQUENCY  PT (By licensed PT), OT (By licensed OT)     PT Frequency: 5 x week OT Frequency: 5 x week            Contractures Contractures Info: Not present    Additional Factors Info  Code Status, Allergies, Psychotropic, Isolation Precautions Code Status Info: Full Allergies Info: Penicillins Psychotropic Info: Adjustment disorder with mixed anxiety and depressed mood: Prozac 60 mg PO daily, Zyprexa 5 mg PO daily, Ativan 0.5 mg PO BID between meals and at bedtime prn.   Isolation Precautions Info: Contact: MRSA     Current Medications (02/20/2017):  This is the current hospital active medication list Current Facility-Administered Medications  Medication Dose Route Frequency Provider Last Rate Last Dose  . albuterol (PROVENTIL) (2.5 MG/3ML) 0.083% nebulizer solution 2.5 mg  2.5 mg Nebulization Q4H PRN Wendee Beavers, DO      . amLODipine (NORVASC) tablet 2.5 mg  2.5 mg Oral Daily Wendee Beavers, DO   2.5 mg at 02/20/17 0944  . aspirin EC  tablet 325 mg  325 mg Oral Daily Wendee BeaversMcMullen, David J, DO   325 mg at 02/20/17 0944  . atorvastatin (LIPITOR) tablet 20 mg  20 mg Oral Daily Wendee BeaversMcMullen, David J, DO   20 mg at 02/20/17 0944  . aztreonam (AZACTAM) 1 g in dextrose 5 % 50 mL IVPB  1 g Intravenous Q8H Chambliss,  Marshall L, MD 100 mL/hr at 02/20/17 0539 1 g at 02/20/17 0539  . baclofen (LIORESAL) tablet 10 mg  10 mg Oral QHS Wendee BeaversMcMullen, David J, DO   10 mg at 02/19/17 2127  . carvedilol (COREG) tablet 25 mg  25 mg Oral BID WC Wendee BeaversMcMullen, David J, DO   25 mg at 02/20/17 0944  . Chlorhexidine Gluconate Cloth 2 % PADS 6 each  6 each Topical Q0600 Oneal GroutPandey, Mahima, MD   6 each at 02/20/17 0548  . dorzolamide-timolol (COSOPT) 22.3-6.8 MG/ML ophthalmic solution 1 drop  1 drop Both Eyes BID Wendee BeaversMcMullen, David J, DO   1 drop at 02/20/17 0944  . enoxaparin (LOVENOX) injection 40 mg  40 mg Subcutaneous Q24H Wendee BeaversMcMullen, David J, DO   40 mg at 02/19/17 2010  . FLUoxetine (PROZAC) capsule 60 mg  60 mg Oral Daily Wendee BeaversMcMullen, David J, DO   60 mg at 02/20/17 0944  . furosemide (LASIX) tablet 40 mg  40 mg Oral Daily Wendee BeaversMcMullen, David J, DO   40 mg at 02/20/17 0945  . ipratropium-albuterol (DUONEB) 0.5-2.5 (3) MG/3ML nebulizer solution 3 mL  3 mL Nebulization TID Carney Livinghambliss, Marshall L, MD   3 mL at 02/20/17 0916  . levothyroxine (SYNTHROID, LEVOTHROID) tablet 88 mcg  88 mcg Oral QAC breakfast Wendee BeaversMcMullen, David J, DO   88 mcg at 02/20/17 0539  . LORazepam (ATIVAN) tablet 0.5 mg  0.5 mg Oral BID BM & HS PRN Arvilla MarketWallace, Catherine Lauren, DO   0.5 mg at 02/20/17 0004  . mupirocin ointment (BACTROBAN) 2 % 1 application  1 application Nasal BID Oneal GroutPandey, Mahima, MD   1 application at 02/20/17 0943  . OLANZapine (ZYPREXA) tablet 5 mg  5 mg Oral Daily Wendee BeaversMcMullen, David J, DO   5 mg at 02/20/17 0944  . pantoprazole (PROTONIX) EC tablet 20 mg  20 mg Oral QAC breakfast Wendee BeaversMcMullen, David J, DO   20 mg at 02/20/17 0548  . potassium chloride SA (K-DUR,KLOR-CON) CR tablet 20 mEq  20 mEq Oral Daily Wendee BeaversMcMullen, David J, DO   20 mEq at 02/20/17 0944  . vancomycin (VANCOCIN) IVPB 750 mg/150 ml premix  750 mg Intravenous Q12H Rumbarger, Faye RamsayRachel L, RPH 150 mL/hr at 02/20/17 0945 750 mg at 02/20/17 0945     Discharge Medications: Please see discharge summary for a list of  discharge medications.  Relevant Imaging Results:  Relevant Lab Results:   Additional Information SS#: [id number]  Margarito LinerSarah C Demarrius Guerrero, LCSW

## 2017-02-21 LAB — BASIC METABOLIC PANEL
Anion gap: 9 (ref 5–15)
BUN: 11 mg/dL (ref 6–20)
CO2: 23 mmol/L (ref 22–32)
CREATININE: 0.71 mg/dL (ref 0.44–1.00)
Calcium: 9.5 mg/dL (ref 8.9–10.3)
Chloride: 106 mmol/L (ref 101–111)
GFR calc non Af Amer: >60 mL/min (ref >60)
GLUCOSE: 108 mg/dL — AB (ref 65–99)
Potassium: 3.8 mmol/L (ref 3.5–5.1)
Sodium: 138 mmol/L (ref 135–145)

## 2017-02-21 LAB — CBC WITH DIFFERENTIAL/PLATELET
BASOS ABS: 0.1 10*3/uL (ref 0.0–0.1)
Basophils Relative: 1 %
EOS PCT: 3 %
Eosinophils Absolute: 0.4 10*3/uL (ref 0.0–0.7)
HCT: 39.2 % (ref 36.0–46.0)
Hemoglobin: 12.3 g/dL (ref 12.0–15.0)
LYMPHS ABS: 3.3 10*3/uL (ref 0.7–4.0)
LYMPHS PCT: 25 %
MCH: 29.9 pg (ref 26.0–34.0)
MCHC: 31.4 g/dL (ref 30.0–36.0)
MCV: 95.4 fL (ref 78.0–100.0)
MONO ABS: 1.3 10*3/uL — AB (ref 0.1–1.0)
Monocytes Relative: 10 %
Neutro Abs: 8.1 10*3/uL — ABNORMAL HIGH (ref 1.7–7.7)
Neutrophils Relative %: 61 %
PLATELETS: 347 10*3/uL (ref 150–400)
RBC: 4.11 MIL/uL (ref 3.87–5.11)
RDW: 15.6 % — AB (ref 11.5–15.5)
WBC: 13 10*3/uL — ABNORMAL HIGH (ref 4.0–10.5)

## 2017-02-21 LAB — CBC
HCT: 42.8 % (ref 36.0–46.0)
Hemoglobin: 13.2 g/dL (ref 12.0–15.0)
MCH: 29.7 pg (ref 26.0–34.0)
MCHC: 30.8 g/dL (ref 30.0–36.0)
MCV: 96.4 fL (ref 78.0–100.0)
PLATELETS: 321 10*3/uL (ref 150–400)
RBC: 4.44 MIL/uL (ref 3.87–5.11)
RDW: 15.5 % (ref 11.5–15.5)
WBC: 12.9 10*3/uL — ABNORMAL HIGH (ref 4.0–10.5)

## 2017-02-21 MED ORDER — HYDROCODONE-ACETAMINOPHEN 5-325 MG PO TABS
1.0000 | ORAL_TABLET | Freq: Three times a day (TID) | ORAL | Status: DC | PRN
  Administered 2017-02-21 – 2017-02-22 (×2): 1 via ORAL
  Filled 2017-02-21 (×2): qty 1

## 2017-02-21 MED ORDER — HYDROCODONE-ACETAMINOPHEN 5-325 MG PO TABS: 1.0000 | ORAL_TABLET | Freq: Four times a day (QID) | ORAL | Status: DC | PRN

## 2017-02-21 MED ORDER — AZTREONAM 1 G IJ SOLR
1.0000 g | Freq: Three times a day (TID) | INTRAMUSCULAR | Status: DC
Start: 2017-02-21 — End: 2017-02-22
  Administered 2017-02-21 – 2017-02-22 (×3): 1 g via INTRAVENOUS
  Filled 2017-02-21 (×3): qty 1

## 2017-02-21 MED ORDER — ADULT MULTIVITAMIN W/MINERALS CH
1.0000 | ORAL_TABLET | Freq: Every day | ORAL | Status: DC
  Administered 2017-02-21 – 2017-02-22 (×2): 1 via ORAL
  Filled 2017-02-21: qty 1

## 2017-02-21 NOTE — Progress Notes (Signed)
Family Medicine Progress Update  Will recheck wbc at 1500, if stable or decreased can likely send back to snf this afternoon. If increased will need to place back on iv abx.  Myrene BuddyJacob Janisa Labus MD PGY-1 Family Medicine Resident

## 2017-02-21 NOTE — Clinical Social Work Note (Signed)
The insurance company was requesting a peer-to-peer review to determine if an [facility]. MD stated that patient was close to baseline. Determined that peer-to-peer is not needed and patient will return to [facility] under long-term care as before. Per MD, patient will likely discharge tomorrow. SNF notified.  Charlynn CourtSarah Tamie Minteer, CSW [phone]

## 2017-02-21 NOTE — Progress Notes (Signed)
Family Medicine Teaching Service Daily Progress Note Intern Pager: [phone]  Patient name: Debra Johnston Medical record number: [id number] Date of birth: Jun 20, 1927 Age: 84 y.o. Gender: female  Primary Care Provider: [name], MD Consultants: None Code Status: Full (confirmed on admission)  Pt Overview and Major Events to Date:  12/31: Admit for HAP  Assessment and Plan: Debra Johnston is an 84 y.o. female presenting with fatigue, non-productive cough, SOB, and confusion found to have HAP on CTA after failing 3 courses of outpatient treatment for PNA. She is currently a BIBEMS from [facility]. PMH is significant for HFpEF, PAF, HTN, HLD, CKDIV, hypothyroidism, GERD, glaucoma, fibromyalgia, anxiety and depression.  Healthcare acquired pneumonia: Acute. Patient on 1L O2 this am. Turned down to RA this am. Will continue to follow.  Does not use oxygen at home.  Cough and fatigue improving.  Remains afebrile.  No signs of ACS, CHF exacerbation PE, or aortic dissection negative CTA. S/P vanc and aztreonam (12/31-1/2). Afebrile on doxycycline (started 1/2). Blood cx no growth at 3 days. Urine culture no growth. 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 - Continue doxycycline for total 7 day course - Continuous pulse ox - Continue to follow blood cultures, sputum stain - Supplemental oxygen, keep sat greater than 88%,wean as tolerated - Albuterol nebulizer every 4 hours as needed for wheezing or shortness of breath - Monitor fever curve  AMS- resolved AMS on admission, now resolved. Likely 2/2 to HCAP. Continue delerium precautions given advanced age. - delirium precautions - avoid beer's list meds  HFpEF  CAD  PAF: Chronic.  Stable.  Last echo EF 55-60%, moderate concentric hypertrophy with severe septal hypertrophy, no wall motion abnormality, mildly dilated aortic root as of 03/22/2015.  Sees Dr. [place] for cardiology management.  BNP and negative troponin on admission.   Was previously on Coumadin for PAF but discontinued due to history of falls.  EKG NSR.  Cholelithiasis: Incidental finding on CTA.  Patient remains asymptomatic. Unlikely to be good surgical candidate for elective procedure.  GERD  proximal esophageal thickening: Incidental finding on CTA.  Does have history of reflux. - Continue Protonix 20 mg daily  CKD stage IV: Creatinine remains at baseline. - Avoid nephrotoxic agents - Continue Lasix 40 mg daily and K-Dur daily  HTN: Chronic.  Stable.  HTN 152/75 this am, can consider prn if sbp>170 but will watch for now. -Continue home amlodipine 2.5 mg daily, Coreg 25 mg twice daily, aspirin 325 daily, Lipitor 20 mg daily  Hypothyroidism: Chronic.  Stable.  TSH WNL on admission. - Continue Synthroid 88 mcg daily  Fibromyalgia: Chronic.  Stable. - Continue baclofen 10 mg prior to bedtime  Protein calorie malnutrition: Unspecified severity.  Patient chronically bedbound with multiple core morbidities. - per dietary, start multi-vitamin - ensure shakes with meals  Morbid obesity  H/o falls: Chronic periods significant deconditioning.  Completed by chronic bedbound state. Will be going back to snf when ok for dc. - PT/OT eval completed, going back to snf - CSW for SNF placement  Dementia with visual hallucinations  depression and anxiety: Patient pleasantly demented without active visual hallucinations. -Continue home olanzapine 5 mg daily and Prozac 40 mg daily  FEN/GI: Heart healthy diet Prophylaxis: Lovenox SQ  Disposition: likely back to snf this pm  Subjective:  Doing well this am. Still not much appetite but has been able to keep down some food. Breathing much better, on 1 L this am.  Objective: Temp:  [  98 F (36.7 C)-98.4 F (36.9 C)] 98 F (36.7 C) (01/03 0657) Pulse Rate:  [61-98] 98 (01/03 0701) Resp:  [18] 18 (01/03 0701) BP: (122-152)/(41-75) 152/75 (01/03 0657) SpO2:  [97 %-98 %] 98 % (01/03 0701) Weight:  [205  lb 14.6 oz (93.4 kg)] 205 lb 14.6 oz (93.4 kg) (01/03 0657) Physical Exam: Gen: alert, oriented x3, no acute distress, resting comfortably, eating breakfast CV: regular rate and rhythm, no m/r/g, no LE edema Resp: improving crackles in right mid and lower lung fields, No wheezes. Comfortable work of breathing, good effort. On 1L. Abd: Nt, ND, soft MSK:  Moves 4 extremities equally Skin: warm, dry, intact, no rashes  Laboratory: Recent Labs  Lab 02/19/17 0400 02/20/17 0827 02/21/17 0517  WBC 11.8* 11.0* 12.9*  HGB 12.3 12.9 13.2  HCT 39.4 41.0 42.8  PLT 336 333 321   Recent Labs  Lab 02/18/17 0804  02/20/17 0827 02/20/17 1250 02/21/17 0517  NA 141   < > 137 136 138  K 3.8   < > 5.6* 3.8 3.8  CL 107   < > 103 105 106  CO2 22   < > 23 23 23   BUN 7   < > 11 10 11   CREATININE 0.95   < > 0.76 0.70 0.71  CALCIUM 9.4   < > 9.3 9.3 9.5  PROT 6.5  --   --   --   --   BILITOT 0.6  --   --   --   --   ALKPHOS 73  --   --   --   --   ALT 20  --   --   --   --   AST 30  --   --   --   --   GLUCOSE 126*   < > 166* 123* 108*   < > = values in this interval not displayed.    Imaging/Diagnostic Tests: CT ANGIO CHEST PE W OR WO CONTRAST (02/18/17):  1. Focal airspace consolidation/atelectasis in the superior segment right lower lobe and posterior right lower lobe. 2. Negative for acute PE or thoracic aortic dissection. 3. Coronary and Aortic Atherosclerosis (ICD10-170.0) 4. Retained material in the proximal esophagus with wall thickening suspected in the mid and distal esophagus, possibly esophagitis versus mucosal lesion. Consider elective endoscopy or barium swallow preferably as an outpatient when the patient can cooperate, for further evaluation. 5. Cholelithiasis.  DG CHEST 2 VIEW (02/18/17):  Subsegmental atelectasis or early pneumonia in the right perihilar region superimposed upon chronic bronchitic changes. Followup PA and lateral chest X-ray is recommended in 3-4 weeks  following trial of\ antibiotic therapy to ensure resolution and exclude underlying malignancy.  Myrene BuddyFletcher, Drystan Reader, MD 02/21/2017, 9:35 AM PGY-1, [facility] Family Medicine FPTS Intern pager: [phone], text pages welcome

## 2017-02-21 NOTE — Progress Notes (Signed)
Pharmacy Antibiotic Note  Debra Johnston is a 84 y.o. female admitted on 02/18/2017 with altered mental status and fatigue.  Patient was started on broad spectrum antibiotics for HCAP, then narrowed to doxycycline on 02/21/16.  Pharmacy received consult to resume Azactam today.  Patient's renal function is improving.  She is afebrile and her WBC is relatively unchanged.   Plan: Azactam 1gm IV Q8H Monitor renal fxn, clinical progress   Height: 5\' 10"  (177.8 cm) Weight: 205 lb 14.6 oz (93.4 kg) IBW/kg (Calculated) : 68.5  Temp (24hrs), Avg:98.1 F (36.7 C), Min:98 F (36.7 C), Max:98.2 F (36.8 C)  Recent Labs  Lab 02/18/17 0804 02/19/17 0400 02/20/17 0827 02/20/17 1250 02/21/17 0517 02/21/17 1428  WBC 10.0 11.8* 11.0*  --  12.9* 13.0*  CREATININE 0.95 0.87 0.76 0.70 0.71  --     Estimated Creatinine Clearance: 61.4 mL/min (by C-G formula based on SCr of 0.71 mg/dL).    Allergies  Allergen Reactions  . Penicillins Swelling and Rash    Vanc 12/31>>1/3 Azactam 12/31>1/2, resume 1/3 >> Doxy 1/2 >> 1/3  Strep pneumo: neg 1/1 MRSA PCR positive 12/31 Bcx - ngtd 12/31 Ucx - neg  Zubin Pontillo D. Laney Potashang, PharmD, BCPS Pager:  [phone] 02/21/2017, 4:47 PM

## 2017-02-21 NOTE — Plan of Care (Signed)
  Clinical Measurements: Will remain free from infection 02/21/2017 1147 - Progressing by Loel LoftyLewis, See Beharry P, RN

## 2017-02-21 NOTE — Progress Notes (Signed)
Physical Therapy Treatment Patient Details Name: Debra Johnston MRN: [id number] DOB: 06-07-1927 Today's Date: 02/21/2017    History of Present Illness Debra Johnston a 84 y.o.femalepresenting with fatigue, non-productive cough, SOB, and confusion found to have HAP on CTA after failing 3 courses of outpatient treatment for PNA.[facility].PMH is significant forHFpEF,PAF, HTN, HLD,CKDIV,hypothyroidism, GERD, glaucoma, fibromyalgia, anxiety and depression.Patient is a resident at [facility] with a history of 6 weeks of nonproductive cough, increased fatigue, and shortness of breath.  She completed 3 separate courses of Levaquin.  A nurse at her facility noted patient had increase confusion prompting transfer to ED.  Upon admission, patient was given oxygen but did not appear in respiratory distress.  CXR supported right sided pneumonia consistent with clinical presentation.     PT Comments    Patient received in bed, pleasant but reluctant to participate in skilled PT treatment this afternoon, ultimately agreeable to bed exercise. Performed functional bed level exercises for B LEs, with VC for correct form and pacing of exercise, patient continues to report fatigue during exercises and required verbal encouragement to continue. Education provided regarding importance of participation with exercise/rehab, importance of activity and exercise in overcoming functional weakness from being in bed. Patient left with all needs met, bed alarm activated this afternoon.    Follow Up Recommendations  SNF;Supervision/Assistance - 24 hour     Equipment Recommendations  None recommended by PT    Recommendations for Other Services       Precautions / Restrictions Precautions Precautions: Fall Restrictions Weight Bearing Restrictions: No    Mobility  Bed Mobility               General bed mobility comments: DNT, patient declined all activities except  for bed ther ex this afternoon   Transfers                 General transfer comment: DNT, patient declined all activities except for bed ther ex this afternoon   Ambulation/Gait             General Gait Details: DNT, patient declined all activities except for bed ther ex this afternoon    Stairs            Wheelchair Mobility    Modified Rankin (Stroke Patients Only)       Balance Overall balance assessment: Needs assistance Sitting-balance support: No upper extremity supported;Feet supported Sitting balance-Leahy Scale: Poor Sitting balance - Comments: balance levels carried over from previous note                                     Cognition Arousal/Alertness: Awake/alert Behavior During Therapy: Flat affect Overall Cognitive Status: History of cognitive impairments - at baseline                                 General Comments: Able to follow commands well and attend to conversation. Unable to state name of facility in which she lives.       Exercises General Exercises - Lower Extremity Ankle Circles/Pumps: Both;20 reps;Supine Short Arc Quad: Both;Supine;10 reps Heel Slides: Both;10 reps;Supine Hip ABduction/ADduction: Both;10 reps;Supine Straight Leg Raises: Both;5 reps;Supine    General Comments        Pertinent Vitals/Pain Pain Assessment: 0-10 Pain Score: 9  Pain Location: back  Pain Descriptors / Indicators: Aching Pain Intervention(s): Limited activity within patient's tolerance;Monitored during session;Patient requesting pain meds-RN notified;Repositioned    Home Living                      Prior Function            PT Goals (current goals can now be found in the care plan section) Acute Rehab PT Goals Patient Stated Goal: get rehab at SNF PT Goal Formulation: With patient Time For Goal Achievement: 03/05/17 Potential to Achieve Goals: Good Progress towards PT goals: Not progressing  toward goals - comment(declined up to EOB or OOB with PT this session, only agreeable to supine ther ex )    Frequency    Min 2X/week      PT Plan Current plan remains appropriate    Co-evaluation              AM-PAC PT "6 Clicks" Daily Activity  Outcome Measure  Difficulty turning over in bed (including adjusting bedclothes, sheets and blankets)?: Unable Difficulty moving from lying on back to sitting on the side of the bed? : Unable Difficulty sitting down on and standing up from a chair with arms (e.g., wheelchair, bedside commode, etc,.)?: Unable Help needed moving to and from a bed to chair (including a wheelchair)?: Total Help needed walking in hospital room?: Total Help needed climbing 3-5 steps with a railing? : Total 6 Click Score: 6    End of Session   Activity Tolerance: Patient limited by fatigue Patient left: in bed;with call bell/phone within reach;with bed alarm set   PT Visit Diagnosis: Unsteadiness on feet (R26.81);Muscle weakness (generalized) (M62.81)     Time: 1535-1550 PT Time Calculation (min) (ACUTE ONLY): 15 min  Charges:  $Therapeutic Exercise: 8-22 mins                    G Codes:       Deniece Ree PT, DPT, CBIS  Supplemental Physical Therapist [place]

## 2017-02-22 LAB — CBC WITH DIFFERENTIAL/PLATELET
Basophils Absolute: 0.2 10*3/uL — ABNORMAL HIGH (ref 0.0–0.1)
Basophils Relative: 1 %
EOS PCT: 5 %
Eosinophils Absolute: 0.6 10*3/uL (ref 0.0–0.7)
HEMATOCRIT: 40.8 % (ref 36.0–46.0)
HEMOGLOBIN: 12.5 g/dL (ref 12.0–15.0)
LYMPHS ABS: 3.8 10*3/uL (ref 0.7–4.0)
LYMPHS PCT: 29 %
MCH: 29.1 pg (ref 26.0–34.0)
MCHC: 30.6 g/dL (ref 30.0–36.0)
MCV: 95.1 fL (ref 78.0–100.0)
Monocytes Absolute: 1.1 10*3/uL — ABNORMAL HIGH (ref 0.1–1.0)
Monocytes Relative: 9 %
NEUTROS ABS: 7.3 10*3/uL (ref 1.7–7.7)
NEUTROS PCT: 56 %
Platelets: 332 10*3/uL (ref 150–400)
RBC: 4.29 MIL/uL (ref 3.87–5.11)
RDW: 15.4 % (ref 11.5–15.5)
WBC: 12.9 10*3/uL — AB (ref 4.0–10.5)

## 2017-02-22 MED ORDER — DOXYCYCLINE HYCLATE 100 MG PO TABS: 100.0000 mg | ORAL_TABLET | Freq: Two times a day (BID) | ORAL | 0 refills | Status: DC

## 2017-02-22 MED ORDER — FLUCONAZOLE 150 MG PO TABS
150.0000 mg | ORAL_TABLET | Freq: Every day | ORAL | Status: DC
  Filled 2017-02-22: qty 1

## 2017-02-22 MED ORDER — FLUCONAZOLE 150 MG PO TABS: ORAL_TABLET | ORAL | 0 refills | Status: DC

## 2017-02-22 MED ORDER — DOXYCYCLINE HYCLATE 100 MG PO TABS
100.0000 mg | ORAL_TABLET | Freq: Two times a day (BID) | ORAL | Status: DC
  Administered 2017-02-22: 100 mg via ORAL
  Filled 2017-02-22: qty 1

## 2017-02-22 MED ORDER — DOXYCYCLINE HYCLATE 100 MG PO TABS: 100.0000 mg | ORAL_TABLET | Freq: Two times a day (BID) | ORAL | Status: DC

## 2017-02-22 NOTE — Discharge Instructions (Signed)
Please continue to take all medications as prescribed with the following differences: Please take 1 tablet of doxycycline every 12 hours for three doses Please take 1 additional diflucan if needed in a couple of days for symptoms of yeast infection Please follow up with your pcp in 10-14 days    Healthcare-Associated Pneumonia Healthcare-associated pneumonia is a lung infection that a person can get when in a health care setting or during certain procedures. The infection causes air sacs inside the lungs to fill with pus or fluid. Healthcare-associated pneumonia is usually caused by bacteria that are common in health care settings. These bacteria may be resistant to some antibiotic medicines. What are the causes? This condition is caused by bacteria that get into your lungs. You can get this condition if you:  Breathe in droplets from an infected person's cough or sneeze.  Touch something that an infected person coughed or sneezed on and then touch your mouth, nose, or eyes.  Have a bacterial infection somewhere else in your body, if the bacteria spread to your lungs through your blood.  What increases the risk? This condition is more likely to develop in people who:  Have a disease that weakens their body's defense system (immune system) or their ability to cough out germs.  Are older than age 82.  Having trouble swallowing.  Use a feeding or breathing tube.  Have a cold or the flu.  Have an IV tube inserted in a vein.  Have surgery.  Have a bed sore.  Live in a long-term care facility, such as a nursing home.  Were in the hospital for two or more days in the past 3 months.  Received hemodialysis in the past 30 days.  What are the signs or symptoms? Symptoms of this condition include:  Fever.  Chills.  Cough.  Shortness of breath.  Wheezing or crackling sounds when breathing.  How is this diagnosed? This condition may be diagnosed based on:  Your  symptoms.  A chest X-ray.  A measurement of the amount of oxygen in your blood.  How is this treated? This condition is treated with antibiotics. Your health care provider may take a sample of cells (culture) from your throat to determine what type of bacteria is in your lungs and change your antibiotic based on the results. If you have bacteria in your blood, trouble breathing, or a low oxygen level, you may need to be treated at the hospital. At the hospital, you will be given antibiotics through an IV tube. You may also be given oxygen or breathing treatments. Follow these instructions at home: Medicine  Take your antibiotic medicine as told by your health care provider. Do not stop taking the antibiotic even if you start to feel better.  Take over-the-counter and other prescription medicines only as told by your health care provider. Activity  Rest at home until you feel better.  Return to your normal activities as told by your health care provider. Ask your health care provider what activities are safe for you. General instructions  Drink enough fluid to keep your urine clear or pale yellow.  Do not use any products that contain nicotine or tobacco, such as cigarettes and e-cigarettes. If you need help quitting, ask your health care provider.  Limit alcohol intake to no more than 1 drink per day for nonpregnant women and 2 drinks per day for men. One drink equals 12 oz of beer, 5 oz of wine, or 1 oz of hard liquor.  Keep all follow-up visits as told by your health care provider. This is important. How is this prevented? Actions that I can take To lower your risk of getting this condition again:  Do not smoke. This includes e-cigarettes.  Do not drink too much alcohol.  Keep your immune system healthy by eating well and getting enough sleep.  Get a flu shot every year (annually).  Get a pneumonia vaccination if: ? You are older than age 25. ? You smoke. ? You have a  long-lasting condition like lung disease.  Exercise your lungs by taking deep breaths, walking, and using an incentive spirometer as directed.  Wash your hands often with soap and water. If you cannot get to a sink to wash your hands, use an alcohol-based hand cleaner.  Make sure your health care providers are washing their hands. If you do not see them wash their hands, ask them to do so.  When you are in a health care facility, avoid touching your eyes, nose, and mouth.  Avoid touching any surface near where people have coughed or sneezed.  Stand away from sick people when they are coughing or sneezing.  Wear a mask if you cannot avoid exposure to people who are sick.  Clean all surfaces often with a disinfectant cleaner, especially if someone is sick at home or work.  Precautions of my health care team Hospitals, nursing homes, and other health care facilities take special care to try to prevent healthcare-associated pneumonia. To do this, your health care team may:  Clean their hands with soap and water or with alcohol-based hand sanitizer before and after seeing patients.  Wear gloves or masks during treatment.  Sanitize medical instruments, tubes, other equipment, and surfaces in patient rooms.  Raise (elevate) the head of your hospital bed so you are not lying flat. The head of the bed may be elevated 30 degrees or more.  Have you sit up and move around as soon as possible after surgery.  Only insert a breathing tube if needed.  Do these things for you if you have a breathing tube: ? Clean the inside of your mouth regularly. ? Remove the breathing tube as soon as it is no longer needed.  Contact a health care provider if:  Your symptoms do not get better or they get worse.  Your symptoms come back after you have finished taking your antibiotics. Get help right away if:  You have trouble breathing.  You have confusion or difficulty thinking. This information is  not intended to replace advice given to you by your health care provider. Make sure you discuss any questions you have with your health care provider. Document Released: 06/28/2015 Document Revised: 11/22/2015 Document Reviewed: 11/04/2015 Elsevier Interactive Patient Education  [employer].

## 2017-02-22 NOTE — Clinical Social Work Note (Addendum)
CSW facilitated patient discharge including contacting facility to confirm patient discharge plans. Patient declined for CSW to call her son, stating he was already aware. Clinical information faxed to facility and family agreeable with plan. CSW arranged ambulance transport via [facility] to [facility]. RN to call report prior to discharge [phone]).  CSW will sign off for now as social work intervention is no longer needed. Please consult us again if new needs arise.  Charlynn CourtSarah Willodean Leven, CSW [phone]

## 2017-02-22 NOTE — Progress Notes (Signed)
Family Medicine Teaching Service Daily Progress Note Intern Pager: [phone]  Patient name: Debra Johnston Berenson Medical record number: [id number] Date of birth: [birth year] Age: 84 y.o. Gender: female  Primary Care Provider: Oneal GroutPandey, Mahima, MD Consultants: None Code Status: Full (confirmed on admission)  Pt Overview and Major Events to Date:  12/31: Admit for HAP  Assessment and Plan: Debra Johnston Harvill is an 84 y.o. female presenting with fatigue, non-productive cough, SOB, and confusion found to have HAP on CTA after failing 3 courses of outpatient treatment for PNA. She is currently a [facility]EMS from [facility]. PMH is significant for HFpEF, PAF, HTN, HLD, CKDIV, hypothyroidism, GERD, glaucoma, fibromyalgia, anxiety and depression.  Healthcare acquired pneumonia:  Continues to do very well on room air with no desats or respiratory distress. Cough has improved from yesterday but does continue to have some congestion. Restarted Aztreonam on 1/3 due to increase in wbc. Stable at 13 last two checks. Blood culture no growth at 3 days. Urine culture no growth. MRSA carrier by nasal swab. Given her continued clinical improvement can likely switch back to doxy today. Will be day 6 of total antibiotics today. - switch back to doxycycline to complete 7 day course - monitor vital signs, if afebrile and wbc stable can likely dc 1/5 - continuous pulse ox - continue to follow blood cultures - supplemental o2 as needed - albuterol as needed for wheezing  AMS- resolved AMS on admission, now resolved. Likely 2/2 to HCAP. Continue delerium precautions given advanced age. - delirium precautions - avoid beer's list meds  HFpEF  CAD  PAF: Chronic.  Stable.  Last echo EF 55-60%, moderate concentric hypertrophy with severe septal hypertrophy, no wall motion abnormality, mildly dilated aortic root as of 03/22/2015.  Sees Dr. Swazil[place]Jordan for cardiology management.  BNP and negative troponin on admission.  Was previously  on Coumadin for PAF but discontinued due to history of falls.  EKG NSR.  Cholelithiasis: Incidental finding on CTA.  Patient remains asymptomatic. Unlikely to be good surgical candidate for elective procedure.  GERD  proximal esophageal thickening: Incidental finding on CTA.  Does have history of reflux. - Continue Protonix 20 mg daily  CKD stage IV: Creatinine remains at baseline. - Avoid nephrotoxic agents - Continue Lasix 40 mg daily and K-Dur daily  HTN: Chronic.  Stable.  HTN 152/75 this am, can consider prn if sbp>170 but will watch for now. -Continue home amlodipine 2.5 mg daily, Coreg 25 mg twice daily, aspirin 325 daily, Lipitor 20 mg daily  Hypothyroidism: Chronic.  Stable.  TSH WNL on admission. - Continue Synthroid 88 mcg daily  Fibromyalgia: Chronic.  Stable. - Continue baclofen 10 mg prior to bedtime  Protein calorie malnutrition: Unspecified severity.  Patient chronically bedbound with multiple core morbidities. - per dietary, start multi-vitamin - ensure shakes with meals  Morbid obesity  H/o falls: Chronic periods significant deconditioning.  Completed by chronic bedbound state. Will be going back to snf when ok for dc. - PT/OT eval completed, going back to snf - CSW for SNF placement  Dementia with visual hallucinations  depression and anxiety: Patient pleasantly demented without active visual hallucinations. -Continue home olanzapine 5 mg daily and Prozac 40 mg daily  FEN/GI: Heart healthy diet Prophylaxis: Lovenox SQ  Disposition: likely back to snf pm 1/4 vs 1/5  Subjective:  Doing well this morning, no acute distress. Says that she feels a little better than yesterday. Did not eat much breakfast but said that she is not much  of a breakfast person.  Objective: Temp:  [97.9 F (36.6 C)-98.2 F (36.8 C)] 98.2 F (36.8 C) (01/04 0506) Pulse Rate:  [66-106] 66 (01/04 0506) Resp:  [18] 18 (01/04 0506) BP: (126-155)/(39-55) 155/53 (01/04 0506) SpO2:   [92 %-95 %] 92 % (01/04 0506) Weight:  [203 lb 4.2 oz (92.2 kg)] 203 lb 4.2 oz (92.2 kg) (01/04 0506) Physical Exam: Gen: a&o x3, no acute distress, resting comfortably, eating breakfast, on room air Cv: rrr, no m/Johnston/g, no LE edema Resp: improved crackles, very faint stirtorous breath sounds, comfortable work of breathing, on room air Abd: Non-tender, non-distended, soft, bs + Msk: able to move all four extremities Skin: warm, dry, intact, no rashes  Laboratory: Recent Labs  Lab 02/21/17 0517 02/21/17 1428 02/22/17 0511  WBC 12.9* 13.0* 12.9*  HGB 13.2 12.3 12.5  HCT 42.8 39.2 40.8  PLT 321 347 332   Recent Labs  Lab 02/18/17 0804  02/20/17 0827 02/20/17 1250 02/21/17 0517  NA 141   < > 137 136 138  K 3.8   < > 5.6* 3.8 3.8  CL 107   < > 103 105 106  CO2 22   < > 23 23 23   BUN 7   < > 11 10 11   CREATININE 0.95   < > 0.76 0.70 0.71  CALCIUM 9.4   < > 9.3 9.3 9.5  PROT 6.5  --   --   --   --   BILITOT 0.6  --   --   --   --   ALKPHOS 73  --   --   --   --   ALT 20  --   --   --   --   AST 30  --   --   --   --   GLUCOSE 126*   < > 166* 123* 108*   < > = values in this interval not displayed.    Imaging/Diagnostic Tests: CT ANGIO CHEST PE W OR WO CONTRAST (02/18/17):  1. Focal airspace consolidation/atelectasis in the superior segment right lower lobe and posterior right lower lobe. 2. Negative for acute PE or thoracic aortic dissection. 3. Coronary and Aortic Atherosclerosis (ICD10-170.0) 4. Retained material in the proximal esophagus with wall thickening suspected in the mid and distal esophagus, possibly esophagitis versus mucosal lesion. Consider elective endoscopy or barium swallow preferably as an outpatient when the patient can cooperate, for further evaluation. 5. Cholelithiasis.  DG CHEST 2 VIEW (02/18/17):  Subsegmental atelectasis or early pneumonia in the right perihilar region superimposed upon chronic bronchitic changes. Followup PA and lateral chest  X-ray is recommended in 3-4 weeks following trial of\ antibiotic therapy to ensure resolution and exclude underlying malignancy.  Myrene Buddy, MD 02/22/2017, 8:51 AM PGY-1, [facility] Family Medicine FPTS Intern pager: [phone], text pages welcome

## 2017-02-23 LAB — CULTURE, BLOOD (ROUTINE X 2)
CULTURE: NO GROWTH
CULTURE: NO GROWTH
SPECIAL REQUESTS: ADEQUATE

## 2017-04-15 ENCOUNTER — Institutional Professional Consult (permissible substitution): Payer: Medicare Other | Admitting: Pulmonary Disease

## 2017-04-26 ENCOUNTER — Encounter: Payer: Self-pay | Admitting: Internal Medicine

## 2017-04-26 ENCOUNTER — Ambulatory Visit (INDEPENDENT_AMBULATORY_CARE_PROVIDER_SITE_OTHER)
Admission: RE | Admit: 2017-04-26 | Discharge: 2017-04-26 | Disposition: A | Discharge status: Home / Self Care | Payer: Medicare Other | Source: Ambulatory Visit | Attending: Internal Medicine | Admitting: Internal Medicine

## 2017-04-26 ENCOUNTER — Ambulatory Visit (INDEPENDENT_AMBULATORY_CARE_PROVIDER_SITE_OTHER): Payer: Medicare Other | Admitting: Internal Medicine

## 2017-04-26 VITALS — BP 152/90 | HR 80

## 2017-04-26 DIAGNOSIS — Q791 Other congenital malformations of diaphragm: Secondary | ICD-10-CM | POA: Diagnosis not present

## 2017-04-26 DIAGNOSIS — R058 Other specified cough: Secondary | ICD-10-CM | POA: Insufficient documentation

## 2017-04-26 DIAGNOSIS — R05 Cough: Secondary | ICD-10-CM | POA: Diagnosis not present

## 2017-04-26 DIAGNOSIS — J189 Pneumonia, unspecified organism: Secondary | ICD-10-CM

## 2017-04-26 NOTE — Progress Notes (Signed)
Subjective:     Patient ID: Debra Johnston, female   DOB: 09-29-1927    MRN: [id number]  HPI  [id number] yowf never smoker NH Resident due to back /leg limitations  So baseline  w/c to bed with hoyer transfers then started with daytime cough" to point of almost strangling"  Around  end Of oct  2018 comes and goes since then and no better on abx then admitted with dx HCAP   Date of Admission: 02/18/2017                    Date of Discharge: 02/22/2016    Discharge Diagnoses/Problem List:  RLL HAP Acute hypoxia AMS HFpEF CAD PAF Cholelithiasis GERD Proximal esophageal thickening CKD stage IV Hypertension Hypothyroidism Fibromyalgia  protein calorie malnutrition Dementia with visual hallucinations Depression and anxiety  Disposition: back to snf  Discharge Condition: Stable, improved  Discharge Exam: Gen:a&o x3, no acute distress, resting comfortably, eating breakfast, on room air Cv: rrr, no m/r/g, no LE edema Resp: improved crackles, very faint stirtorous breath sounds, comfortable work of breathing, on room air Abd: Non-tender, non-distended, soft, bs + Msk: able to move all four extremities Skin: warm, dry, intact, no rashes   Brief Hospital Course:  Debra DikeFrances R Alleyis a 84 y.o.femalepresenting with fatigue, non-productive cough, SOB, and confusion found to have HAP on CTA after failing 3 courses of outpatient treatment for PNA.[facility].PMH is significant forHFpEF,PAF, HTN, HLD,CKDIV,hypothyroidism, GERD, glaucoma, fibromyalgia, anxiety and depression.  Patient is a resident at [facility] with a history of 6 weeks of nonproductive cough, increased fatigue, and shortness of breath.  She completed 3 separate courses of Levaquin.  A nurse at her facility noted patient had increased confusion prompting transfer to ED.  Upon admission, patient was given oxygen but did not appear in respiratory distress.  CXR supported right sided  pneumonia consistent with clinical presentation.  She was started on aztreonam and vancomycin which improved her respiratory status considerably. On 1/3 she was transitioned back to room air and continued to do very well. She was discharged on 1/4 with an additional 3 doses of doxycycline to complete a 7 day course.  Patient was complaining of symptoms consistent with a yeast infection prior to dc. She was given one dose of fluconazole prior to dc. She was prescribed one dose as needed.  Issues for Follow Up:  1. Follow up respiratory status 2. Follow up compliance and effectiveness with antibiotics  Significant Procedures: none  Significant Labs and Imaging:  LastLabs  Recent Labs  Lab 02/21/17 0517 02/21/17 1428 02/22/17 0511  WBC 12.9* 13.0* 12.9*  HGB 13.2 12.3 12.5  HCT 42.8 39.2 40.8  PLT 321 347 332     LastLabs  Recent Labs  Lab 02/18/17 0804 02/19/17 0400 02/20/17 0827 02/20/17 1250 02/21/17 0517  NA 141 138 137 136 138  K 3.8 3.6 5.6* 3.8 3.8  CL 107 105 103 105 106  CO2 22 23 23 23 23   GLUCOSE 126* 110* 166* 123* 108*  BUN 7 9 11 10 11   CREATININE 0.95 0.87 0.76 0.70 0.71  CALCIUM 9.4 9.4 9.3 9.3 9.5  ALKPHOS 73  --   --   --   --   AST 30  --   --   --   --   ALT 20  --   --   --   --   ALBUMIN 3.4*  --   --   --   --  INR: 1.04 (WNL) Blood culture x2: Pending BNP: 107.1 (149.3, 1year ago) Troponin: Negative TSH: 4.407 (WNL) UA: Negative Urine culture: Pending HIV: Pending Strep pneumo urine antigen: Pending Legionella urine antigen: Pending Sputum culture: Pending  CT ANGIO CHEST PE W OR WO CONTRAST (02/18/17): 1. Focal airspace consolidation/atelectasis in the superior segment right lower lobe and posterior right lower lobe. 2. Negative for acute PE or thoracic aortic dissection. 3. Coronary and Aortic Atherosclerosis (ICD10-170.0) 4. Retained material in the proximal esophagus with wall thickening suspected in the mid and  distal esophagus, possibly esophagitis versus mucosal lesion. Consider elective endoscopy or barium swallow preferably as an outpatient when the patient can cooperate, for further evaluation. 5. Cholelithiasis.  DG CHEST 2 VIEW (02/18/17): Subsegmental atelectasis or early pneumonia in the right perihilar region superimposed upon chronic bronchitic changes. Followup PA and lateral chest X-ray is recommended in 3-4 weeks following trial of\antibiotic therapy to ensure resolution and exclude underlyingmalignancy.   Results/Tests Pending at Time of Discharge:   Discharge Medications:      Allergies as of 02/22/2017      Reactions   Penicillins Swelling, Rash              Medication List     STOP taking these medications   levofloxacin 750 MG tablet Commonly known as:  LEVAQUIN     TAKE these medications   acetaminophen 500 MG tablet Commonly known as:  TYLENOL Take 500 mg by mouth every 6 (six) hours as needed (BREAK THROUGH PAIN).   albuterol (2.5 MG/3ML) 0.083% nebulizer solution Commonly known as:  PROVENTIL Take 3 mLs (2.5 mg total) by nebulization every 4 (four) hours as needed for wheezing or shortness of breath.   amLODipine 2.5 MG tablet Commonly known as:  NORVASC Take 2.5 mg by mouth daily.   aspirin 325 MG EC tablet Take 325 mg by mouth daily.   atorvastatin 20 MG tablet Commonly known as:  LIPITOR Take 20 mg by mouth daily.   baclofen 10 MG tablet Commonly known as:  LIORESAL Take 10 mg by mouth at bedtime. Tablet at bedtime and every 8 hrs as needed   bisacodyl 5 MG EC tablet Commonly known as:  DULCOLAX Take 1 tablet (5 mg total) by mouth daily as needed for moderate constipation.   carvedilol 25 MG tablet Commonly known as:  COREG Take 25 mg by mouth 2 (two) times daily with a meal. Hold if sbp < 110   CERTAVITE SENIOR/ANTIOXIDANT Tabs Take 1 tablet by mouth daily.   docusate sodium 100 MG capsule Commonly known as:   COLACE Take 100 mg by mouth 2 (two) times daily.   dorzolamide-timolol 22.3-6.8 MG/ML ophthalmic solution Commonly known as:  COSOPT Place 1 drop into both eyes 2 (two) times daily.   doxycycline 100 MG tablet Commonly known as:  VIBRA-TABS Take 1 tablet (100 mg total) by mouth every 12 (twelve) hours.   ferrous sulfate 325 (65 FE) MG tablet Take 325 mg by mouth daily.   fluconazole 150 MG tablet Commonly known as:  DIFLUCAN Take additional dose in three days if needed for symptoms of yeast infection   FLUoxetine 20 MG capsule Commonly known as:  PROZAC Take 20 mg by mouth daily. Patient takes with 40 mg to equal 60 mg total dose What changed:  Another medication with the same name was removed. Continue taking this medication, and follow the directions you see here.   furosemide 40 MG tablet Commonly known as:  LASIX Take  40 mg by mouth daily.   guaiFENesin 600 MG 12 hr tablet Commonly known as:  MUCINEX Take 600 mg by mouth 2 (two) times daily.   HYDROcodone-acetaminophen 5-325 MG tablet Commonly known as:  NORCO/VICODIN Take 1 tablet by mouth every 6 (six) hours as needed. Hold if sleepy or confused   ipratropium-albuterol 0.5-2.5 (3) MG/3ML Soln Commonly known as:  DUONEB Take 3 mLs by nebulization every 8 (eight) hours.   levothyroxine 88 MCG tablet Commonly known as:  SYNTHROID Take 1 tablet (88 mcg total) by mouth daily before breakfast.   LORazepam 0.5 MG tablet Commonly known as:  ATIVAN Take 0.75 mg by mouth daily. What changed:  Another medication with the same name was removed. Continue taking this medication, and follow the directions you see here.   magnesium oxide 400 MG tablet Commonly known as:  MAG-OX Take 400 mg by mouth daily.   meclizine 25 MG tablet Commonly known as:  ANTIVERT Take 25 mg by mouth daily as needed for dizziness.   nitroGLYCERIN 0.4 MG SL tablet Commonly known as:  NITROSTAT Place 0.4 mg under the tongue every  5 (five) minutes as needed for chest pain. If pain is unrelived after 3 doses, call MD   OLANZapine 5 MG tablet Commonly known as:  ZYPREXA Take 5 mg by mouth daily.   oxybutynin 5 MG tablet Commonly known as:  DITROPAN Take 5 mg by mouth 2 (two) times daily.   OXYGEN Inhale into the lungs. Titrate 2-6 LPM to keep O2 stats > 93%   pantoprazole 20 MG tablet Commonly known as:  PROTONIX Take 20 mg by mouth daily before breakfast. 1 hour before breakfast   polyethylene glycol packet Commonly known as:  MIRALAX / GLYCOLAX Take 17 g by mouth daily.   potassium chloride SA 20 MEQ tablet Commonly known as:  K-DUR,KLOR-CON Take 20 mEq by mouth daily. Reported on 04/04/2015   senna-docusate 8.6-50 MG tablet Commonly known as:  Senokot-S Take 1 tablet by mouth daily.   VITAMIN D-3 PO Take 1,000 Units by mouth daily.      04/26/2017 1st  Pulmonary office visit/ Cass Vandermeulen   Chief Complaint  Patient presents with  . Pulmonary Consult    Self referral for recurrent PNA. She resides at [facility]. She c/o non prod cough and SOB. She gets SOB after she eats. Has some chest tightness.   sleeping hosp bed x 30 degrees s cough  No better with prednisone rx or nebs Pattern of cough has waxed and waned x 4-5 months but not directly related to meals/ mostly daytime and not noct.  No obvious day to day or daytime variability or assoc excess/ purulent sputum or mucus plugs or hemoptysis or cp, subjective wheeze or overt sinus or hb symptoms. No unusual exposure hx or h/o childhood pna/ asthma or knowledge of premature birth.  Sleeping ok at 30 degrees  without nocturnal  or early am exacerbation  of respiratory  c/o's or need for noct saba. Also denies any obvious fluctuation of symptoms with weather or environmental changes or other aggravating or alleviating factors except as outlined above   Current Allergies, Complete Past Medical History, Past Surgical History, Family History,  and Social History were reviewed in [employer] record.  ROS  The following are not active complaints unless bolded Hoarseness, sore throat, dysphagia, dental problems, itching, sneezing,  nasal congestion or discharge of excess mucus or purulent secretions, ear ache,   fever, chills, sweats,  unintended wt loss or wt gain, classically pleuritic or exertional cp,  orthopnea pnd or leg swelling, presyncope, palpitations, abdominal pain, anorexia, nausea, vomiting, diarrhea  or change in bowel habits or change in bladder habits, change in stools or change in urine, dysuria, hematuria,  rash, arthralgias, visual complaints, headache, numbness, weakness or ataxia or problems with walking or coordination,  change in mood/affect or memory.         Meds: Not able to verify/ MAR from NH requested     Review of Systems     Objective:   Physical Exam     W/c bound alert elderly wf with very harsh barking quality upper airway cough    Wt Readings from Last 3 Encounters:  02/22/17 203 lb 4.2 oz (92.2 kg)  06/25/16 204 lb 1.6 oz (92.6 kg)  05/29/16 204 lb 1.6 oz (92.6 kg)     Vital signs reviewed - Note on arrival 02 sats  94% on RA and bp 152/90     HEENT: nl dentition, turbinates bilaterally, and oropharynx. Nl external ear canals without cough reflex   NECK :  without JVD/Nodes/TM/ nl carotid upstrokes bilaterally   LUNGS: no acc muscle use,  Nl contour chest which is clear to A and P bilaterally without cough on insp or exp maneuvers   CV:  RRR  no s3 or murmur or increase in P2, and no edema   ABD:  soft and nontender with nl inspiratory excursion in the supine position. No bruits or organomegaly appreciated, bowel sounds nl  MS:   ext warm without deformities, calf tenderness, cyanosis or clubbing No obvious joint restrictions   SKIN: warm and dry without lesions    NEURO:  alert, approp, nl sensorium with  no motor or cerebellar deficits apparent.      I personally reviewed images and agree with radiology impression as follows:   Chest CTa 02/18/17 1. Focal airspace consolidation/atelectasis in the superior segment right lower lobe and posterior right lower lobe. 2. Negative for acute PE or thoracic aortic dissection. 3. Coronary and Aortic Atherosclerosis (ICD10-170.0) 4. Retained material in the proximal esophagus with wall thickening suspected in the mid and distal esophagus, possibly esophagitis versus mucosal lesion.  5. Cholelithiasis.   CXR PA and Lateral:   04/26/2017 :    I personally reviewed images and agree with radiology impression as follows:   No active cardiopulmonary disease. No evidence of pneumonia on today's exam. Eventration of R HD no change since at least 06/10/2007     Assessment:

## 2017-04-26 NOTE — Patient Instructions (Addendum)
For cough > mucinex dm 1200 mg every 12 hours and use the flutter valve as much as you can to prevent throat trauma from coughing     Taper off the prednisone as planned   Protonix should be 40 mg Take 30-60 min before first meal of the day and pepcid 20 mg at bedtime until you return   GERD (REFLUX)  is an extremely common cause of respiratory symptoms just like yours , many times with no obvious heartburn at all.    It can be treated with medication, but also with lifestyle changes including elevation of the head of your bed (ideally with 6 inch  bed blocks),  Smoking cessation, avoidance of late meals, excessive alcohol, and avoid fatty foods, chocolate, peppermint, colas, red wine, and acidic juices such as orange juice.  NO MINT OR MENTHOL PRODUCTS SO NO COUGH DROPS   USE SUGARLESS CANDY INSTEAD (Jolley ranchers or Stover's or Life Savers) or even ice chips will also do - the key is to swallow to prevent all throat clearing. NO OIL BASED VITAMINS - use powdered substitutes.     Please remember to go to the  x-ray department downstairs in the basement  for your tests - we will call you with the results when they are available.  Please schedule a follow up office visit in 4 weeks, sooner if needed

## 2017-04-26 NOTE — Progress Notes (Signed)
Was able to talk to the patient's son (preferred number is son's cell phone) regarding their results.  They verbalized an understanding of what was discussed. No further questions at this time.

## 2017-04-27 ENCOUNTER — Encounter: Payer: Self-pay | Admitting: Internal Medicine

## 2017-04-27 DIAGNOSIS — Q791 Other congenital malformations of diaphragm: Secondary | ICD-10-CM | POA: Insufficient documentation

## 2017-04-27 NOTE — Assessment & Plan Note (Addendum)
CTa 02/18/18 Retained material in the proximal esophagus with wall thickening suspected in the mid and distal esophagus, possibly esophagitis versus mucosal lesion. - max rx for gerd rec 04/26/2017 >>>  The most common causes of chronic cough in immunocompetent adults include the following: upper airway cough syndrome (UACS), previously referred to as postnasal drip syndrome (PNDS), which is caused by variety of rhinosinus conditions; (2) asthma; (3) GERD; (4) chronic bronchitis from cigarette smoking or other inhaled environmental irritants; (5) nonasthmatic eosinophilic bronchitis; and (6) bronchiectasis.   These conditions, singly or in combination, have accounted for up to 94% of the causes of chronic cough in prospective studies.   Other conditions have constituted no >6% of the causes in prospective studies These have included bronchogenic carcinoma, chronic interstitial pneumonia, sarcoidosis, left ventricular failure, ACEI-induced cough, and aspiration from a condition associated with pharyngeal dysfunction.    Chronic cough is often simultaneously caused by more than one condition. A single cause has been found from 38 to 82% of the time, multiple causes from 18 to 62%. Multiply caused cough has been the result of three diseases up to 42% of the time.       Most likely this is Upper airway cough syndrome (previously labeled PNDS),  is so named because it's frequently impossible to sort out how much is  CR/sinusitis with freq throat clearing (which can be related to primary GERD)   vs  causing  secondary (" extra esophageal")  GERD from wide swings in gastric pressure that occur with throat clearing, often  promoting self use of mint and menthol lozenges that reduce the lower esophageal sphincter tone and exacerbate the problem further in a cyclical fashion.   These are the same pts (now being labeled as having "irritable larynx syndrome" by some cough centers) who not infrequently have a  history of having failed to tolerate ace inhibitors,  dry powder inhalers or biphosphonates or report having atypical/extraesophageal reflux symptoms that don't respond to standard doses of PPI  and are easily confused as having aecopd or asthma flares by even experienced allergists/ pulmonologists (myself included).   Of the three most common causes of  Sub-acute or recurrent or chronic cough, only one (GERD)  can actually contribute to/ trigger  the other two (asthma and post nasal drip syndrome)  and perpetuate the cylce of cough.  While not intuitively obvious, many patients with chronic low grade reflux do not cough until there is a primary insult that disturbs the protective epithelial barrier and exposes sensitive nerve endings.   This is typically viral but can be direct physical injury such as with an endotracheal tube.   The point is that once this occurs, it is difficult to eliminate the cycle  using anything but a maximally effective acid suppression regimen at least in the short run, accompanied by an appropriate diet to address non acid GERD and control the trauma to the upper airway by using mucienx dm/ flutter valve   Next step will be to consider DgEs or GI referral   Total time devoted to counseling  > 50 % of initial 60 min office visit:  review case (including extensive inpt records available in Epic)  with pt/ discussion of options/alternatives/ personally creating written customized instructions  in presence of pt  then going over those specific  Instructions directly with the pt including how to use all of the meds but in particular covering each new medication in detail and the difference between the maintenance= "automatic"  meds and the prns using an action plan format for the latter (If this problem/symptom => do that organization reading Left to right).  Please see AVS from this visit for a full list of these instructions which I personally wrote for this pt and  are unique to  this visit.

## 2017-04-27 NOTE — Assessment & Plan Note (Signed)
Present on cxr's dating back to 06/10/2007   This is a marked abnormality on cxr and can contribute to poor cough mechanics on R as limits her ability to take full capacity breath prior to coughing but at age 84 is a moot issue.

## 2017-04-27 NOTE — Assessment & Plan Note (Signed)
Resolved  Clinically and radiographically, no further abx needed and prednisone should be tapered off.

## 2017-05-24 ENCOUNTER — Ambulatory Visit: Payer: Medicare Other | Admitting: Internal Medicine

## 2017-05-31 ENCOUNTER — Ambulatory Visit: Payer: Medicare Other | Admitting: Internal Medicine

## 2017-06-06 ENCOUNTER — Ambulatory Visit (INDEPENDENT_AMBULATORY_CARE_PROVIDER_SITE_OTHER): Payer: Medicare Other | Admitting: Internal Medicine

## 2017-06-06 ENCOUNTER — Other Ambulatory Visit (INDEPENDENT_AMBULATORY_CARE_PROVIDER_SITE_OTHER): Payer: Medicare Other

## 2017-06-06 ENCOUNTER — Encounter: Payer: Self-pay | Admitting: Internal Medicine

## 2017-06-06 ENCOUNTER — Ambulatory Visit (INDEPENDENT_AMBULATORY_CARE_PROVIDER_SITE_OTHER)
Admission: RE | Admit: 2017-06-06 | Discharge: 2017-06-06 | Disposition: A | Discharge status: Home / Self Care | Payer: Medicare Other | Source: Ambulatory Visit | Attending: Internal Medicine | Admitting: Internal Medicine

## 2017-06-06 VITALS — BP 106/60 | HR 68 | Wt 199.0 lb

## 2017-06-06 DIAGNOSIS — R05 Cough: Secondary | ICD-10-CM | POA: Diagnosis not present

## 2017-06-06 DIAGNOSIS — Q791 Other congenital malformations of diaphragm: Secondary | ICD-10-CM | POA: Diagnosis not present

## 2017-06-06 DIAGNOSIS — E039 Hypothyroidism, unspecified: Secondary | ICD-10-CM | POA: Diagnosis not present

## 2017-06-06 DIAGNOSIS — R0609 Other forms of dyspnea: Secondary | ICD-10-CM | POA: Diagnosis not present

## 2017-06-06 DIAGNOSIS — I10 Essential (primary) hypertension: Secondary | ICD-10-CM | POA: Diagnosis not present

## 2017-06-06 DIAGNOSIS — R058 Other specified cough: Secondary | ICD-10-CM

## 2017-06-06 LAB — CBC WITH DIFFERENTIAL/PLATELET
BASOS ABS: 0.1 10*3/uL (ref 0.0–0.1)
Basophils Relative: 1 % (ref 0.0–3.0)
EOS ABS: 0.7 10*3/uL (ref 0.0–0.7)
Eosinophils Relative: 4.8 % (ref 0.0–5.0)
HCT: 42.4 % (ref 36.0–46.0)
Hemoglobin: 14.4 g/dL (ref 12.0–15.0)
LYMPHS ABS: 3.1 10*3/uL (ref 0.7–4.0)
Lymphocytes Relative: 21.1 % (ref 12.0–46.0)
MCHC: 33.9 g/dL (ref 30.0–36.0)
MCV: 89.6 fl (ref 78.0–100.0)
MONOS PCT: 7.4 % (ref 3.0–12.0)
Monocytes Absolute: 1.1 10*3/uL — ABNORMAL HIGH (ref 0.1–1.0)
NEUTROS ABS: 9.5 10*3/uL — AB (ref 1.4–7.7)
NEUTROS PCT: 65.7 % (ref 43.0–77.0)
PLATELETS: 381 10*3/uL (ref 150.0–400.0)
RBC: 4.73 Mil/uL (ref 3.87–5.11)
RDW: 15 % (ref 11.5–15.5)
WBC: 14.5 10*3/uL — ABNORMAL HIGH (ref 4.0–10.5)

## 2017-06-06 LAB — BASIC METABOLIC PANEL
BUN: 25 mg/dL — AB (ref 6–23)
CALCIUM: 9.6 mg/dL (ref 8.4–10.5)
CO2: 24 mEq/L (ref 19–32)
Chloride: 100 mEq/L (ref 96–112)
Creatinine, Ser: 1.03 mg/dL (ref 0.40–1.20)
GFR: 53.76 mL/min — AB (ref >60.00)
Glucose, Bld: 141 mg/dL — ABNORMAL HIGH (ref 70–99)
POTASSIUM: 4.3 meq/L (ref 3.5–5.1)
Sodium: 134 mEq/L — ABNORMAL LOW (ref 135–145)

## 2017-06-06 LAB — TSH: TSH: 8.2 u[IU]/mL — ABNORMAL HIGH (ref 0.35–4.50)

## 2017-06-06 LAB — BRAIN NATRIURETIC PEPTIDE: Pro B Natriuretic peptide (BNP): 45 pg/mL (ref 0.0–100.0)

## 2017-06-06 NOTE — Progress Notes (Signed)
Subjective:     Patient ID: Debra Johnston, female   DOB: January 29, 1928    MRN: [id number]  HPI  84 yowf never smoker NH Resident due to back /leg limitations  So baseline  w/c to bed with hoyer transfers then started with daytime nonproductive  cough" to point of almost strangling"  Around  end Of oct  2018 comes and goes since then and no better on abx then admitted with dx HCAP   Date of Admission: 02/18/2017                    Date of Discharge: 02/22/2016    Discharge Diagnoses/Problem List:  RLL HAP Acute hypoxia AMS HFpEF CAD PAF Cholelithiasis GERD Proximal esophageal thickening CKD stage IV Hypertension Hypothyroidism Fibromyalgia  protein calorie malnutrition Dementia with visual hallucinations Depression and anxiety  Disposition: back to snf  Discharge Condition: Stable, improved  Discharge Exam: Gen:a&o x3, no acute distress, resting comfortably, eating breakfast, on room air Cv: rrr, no m/r/g, no LE edema Resp: improved crackles, very faint stirtorous breath sounds, comfortable work of breathing, on room air Abd: Non-tender, non-distended, soft, bs + Msk: able to move all four extremities Skin: warm, dry, intact, no rashes   Brief Hospital Course:  [name] a 84 y.o.femalepresenting with fatigue, non-productive cough, SOB, and confusion found to have HAP on CTA after failing 3 courses of outpatient treatment for PNA.She is currently [facility].PMH is significant forHFpEF,PAF, HTN, HLD,CKDIV,hypothyroidism, GERD, glaucoma, fibromyalgia, anxiety and depression.  Patient is a resident at [facility] with a history of 6 weeks of nonproductive cough, increased fatigue, and shortness of breath.  She completed 3 separate courses of Levaquin.  A nurse at her facility noted patient had increased confusion prompting transfer to ED.  Upon admission, patient was given oxygen but did not appear in respiratory distress.  CXR supported  right sided pneumonia consistent with clinical presentation.  She was started on aztreonam and vancomycin which improved her respiratory status considerably. On 1/3 she was transitioned back to room air and continued to do very well. She was discharged on 1/4 with an additional 3 doses of doxycycline to complete a 7 day course.  Patient was complaining of symptoms consistent with a yeast infection prior to dc. She was given one dose of fluconazole prior to dc. She was prescribed one dose as needed.  Issues for Follow Up:  1. Follow up respiratory status 2. Follow up compliance and effectiveness with antibiotics  Significant Procedures: none  Significant Labs and Imaging:  LastLabs       Recent Labs  Lab 02/21/17 0517 02/21/17 1428 02/22/17 0511  WBC 12.9* 13.0* 12.9*  HGB 13.2 12.3 12.5  HCT 42.8 39.2 40.8  PLT 321 347 332     LastLabs  Recent Labs  Lab 02/18/17 0804 02/19/17 0400 02/20/17 0827 02/20/17 1250 02/21/17 0517  NA 141 138 137 136 138  K 3.8 3.6 5.6* 3.8 3.8  CL 107 105 103 105 106  CO2 22 23 23 23 23   GLUCOSE 126* 110* 166* 123* 108*  BUN 7 9 11 10 11   CREATININE 0.95 0.87 0.76 0.70 0.71  CALCIUM 9.4 9.4 9.3 9.3 9.5  ALKPHOS 73  --   --   --   --   AST 30  --   --   --   --   ALT 20  --   --   --   --   ALBUMIN  3.4*  --   --   --   --      INR: 1.04 (WNL) Blood culture x2: Pending BNP: 107.1 (149.3, 1year ago) Troponin: Negative TSH: 4.407 (WNL) UA: Negative Urine culture: Pending HIV: Pending Strep pneumo urine antigen: Pending Legionella urine antigen: Pending Sputum culture: Pending  CT ANGIO CHEST PE W OR WO CONTRAST (02/18/17): 1. Focal airspace consolidation/atelectasis in the superior segment right lower lobe and posterior right lower lobe. 2. Negative for acute PE or thoracic aortic dissection. 3. Coronary and Aortic Atherosclerosis (ICD10-170.0) 4. Retained material in the proximal esophagus with wall thickening suspected  in the mid and distal esophagus, possibly esophagitis versus mucosal lesion. Consider elective endoscopy or barium swallow preferably as an outpatient when the patient can cooperate, for further evaluation. 5. Cholelithiasis.  DG CHEST 2 VIEW (02/18/17): Subsegmental atelectasis or early pneumonia in the right perihilar region superimposed upon chronic bronchitic changes. Followup PA and lateral chest X-ray is recommended in 3-4 weeks following trial of\antibiotic therapy to ensure resolution and exclude underlyingmalignancy.   Results/Tests Pending at Time of Discharge:   Discharge Medications:      Allergies as of 02/22/2017      Reactions   Penicillins Swelling, Rash              Medication List     STOP taking these medications   levofloxacin 750 MG tablet Commonly known as:  LEVAQUIN     TAKE these medications   acetaminophen 500 MG tablet Commonly known as:  TYLENOL Take 500 mg by mouth every 6 (six) hours as needed (BREAK THROUGH PAIN).   albuterol (2.5 MG/3ML) 0.083% nebulizer solution Commonly known as:  PROVENTIL Take 3 mLs (2.5 mg total) by nebulization every 4 (four) hours as needed for wheezing or shortness of breath.   amLODipine 2.5 MG tablet Commonly known as:  NORVASC Take 2.5 mg by mouth daily.   aspirin 325 MG EC tablet Take 325 mg by mouth daily.   atorvastatin 20 MG tablet Commonly known as:  LIPITOR Take 20 mg by mouth daily.   baclofen 10 MG tablet Commonly known as:  LIORESAL Take 10 mg by mouth at bedtime. Tablet at bedtime and every 8 hrs as needed   bisacodyl 5 MG EC tablet Commonly known as:  DULCOLAX Take 1 tablet (5 mg total) by mouth daily as needed for moderate constipation.   carvedilol 25 MG tablet Commonly known as:  COREG Take 25 mg by mouth 2 (two) times daily with a meal. Hold if sbp < 110   CERTAVITE SENIOR/ANTIOXIDANT Tabs Take 1 tablet by mouth daily.   docusate sodium 100 MG capsule Commonly  known as:  COLACE Take 100 mg by mouth 2 (two) times daily.   dorzolamide-timolol 22.3-6.8 MG/ML ophthalmic solution Commonly known as:  COSOPT Place 1 drop into both eyes 2 (two) times daily.   doxycycline 100 MG tablet Commonly known as:  VIBRA-TABS Take 1 tablet (100 mg total) by mouth every 12 (twelve) hours.   ferrous sulfate 325 (65 FE) MG tablet Take 325 mg by mouth daily.   fluconazole 150 MG tablet Commonly known as:  DIFLUCAN Take additional dose in three days if needed for symptoms of yeast infection   FLUoxetine 20 MG capsule Commonly known as:  PROZAC Take 20 mg by mouth daily. Patient takes with 40 mg to equal 60 mg total dose What changed:  Another medication with the same name was removed. Continue taking this medication, and follow  the directions you see here.   furosemide 40 MG tablet Commonly known as:  LASIX Take 40 mg by mouth daily.   guaiFENesin 600 MG 12 hr tablet Commonly known as:  MUCINEX Take 600 mg by mouth 2 (two) times daily.   HYDROcodone-acetaminophen 5-325 MG tablet Commonly known as:  NORCO/VICODIN Take 1 tablet by mouth every 6 (six) hours as needed. Hold if sleepy or confused   ipratropium-albuterol 0.5-2.5 (3) MG/3ML Soln Commonly known as:  DUONEB Take 3 mLs by nebulization every 8 (eight) hours.   levothyroxine 88 MCG tablet Commonly known as:  SYNTHROID Take 1 tablet (88 mcg total) by mouth daily before breakfast.   LORazepam 0.5 MG tablet Commonly known as:  ATIVAN Take 0.75 mg by mouth daily. What changed:  Another medication with the same name was removed. Continue taking this medication, and follow the directions you see here.   magnesium oxide 400 MG tablet Commonly known as:  MAG-OX Take 400 mg by mouth daily.   meclizine 25 MG tablet Commonly known as:  ANTIVERT Take 25 mg by mouth daily as needed for dizziness.   nitroGLYCERIN 0.4 MG SL tablet Commonly known as:  NITROSTAT Place 0.4 mg under the  tongue every 5 (five) minutes as needed for chest pain. If pain is unrelived after 3 doses, call MD   OLANZapine 5 MG tablet Commonly known as:  ZYPREXA Take 5 mg by mouth daily.   oxybutynin 5 MG tablet Commonly known as:  DITROPAN Take 5 mg by mouth 2 (two) times daily.   OXYGEN Inhale into the lungs. Titrate 2-6 LPM to keep O2 stats > 93%   pantoprazole 20 MG tablet Commonly known as:  PROTONIX Take 20 mg by mouth daily before breakfast. 1 hour before breakfast   polyethylene glycol packet Commonly known as:  MIRALAX / GLYCOLAX Take 17 g by mouth daily.   potassium chloride SA 20 MEQ tablet Commonly known as:  K-DUR,KLOR-CON Take 20 mEq by mouth daily. Reported on 04/04/2015   senna-docusate 8.6-50 MG tablet Commonly known as:  Senokot-S Take 1 tablet by mouth daily.   VITAMIN D-3 PO Take 1,000 Units by mouth daily.      04/26/2017 1st [facility] office visit/ [facility] Degr[facility]   Chief Complaint  Patient presents with  . Pulmonary Consult    Self referral for recurrent PNA. She resides [facility]. She c/o non prod cough and SOB. She gets SOB after she eats. Has some chest tightness.   sleeping hosp bed x 30 degrees s cough  No better with prednisone rx or nebs Pattern of cough has waxed and waned x 4-5 months but not directly related to meals/ mostly daytime and not noct. rec For cough > mucinex dm 1200 mg every 12 hours and use the flutter valve as much as you can to prevent throat trauma from coughing    Taper off the prednisone as planned  Protonix should be 40 mg Take 30-60 min before first meal of the day and pepcid 20 mg at bedtime until you return  GERD diet    06/06/2017  f/u ov/[facility] re: sob and cough Chief Complaint  Patient presents with  . Follow-up    Cough has improved some. She feels like her breathing has been worse since her last visit.    Dyspnea:  Variable at rest ? Worse after eat assoc with meals Cough: better continues to be non  productive/ datyime  Sleep: better able to lie flat SABA  use: improves breathing despite coreg 25 bid      No obvious day to day or daytime variability or assoc excess/ purulent sputum or mucus plugs or hemoptysis or cp or chest tightness, subjective wheeze or overt sinus or hb symptoms. No unusual exposure hx or h/o childhood pna/ asthma or knowledge of premature birth.  Sleeping ok flat without nocturnal  or early am exacerbation  of respiratory  c/o's or need for noct saba. Also denies any obvious fluctuation of symptoms with weather or environmental changes or other aggravating or alleviating factors except as outlined above   Current Allergies, Complete Past Medical History, Past Surgical History, Family History, and Social History were reviewed in [employer] record.  ROS  The following are not active complaints unless bolded Hoarseness, sore throat, dysphagia, dental problems, itching, sneezing,  nasal congestion or discharge of excess mucus or purulent secretions, ear ache,   fever, chills, sweats, unintended wt loss or wt gain, classically pleuritic or exertional cp,  orthopnea pnd or leg swelling, presyncope, palpitations, abdominal pain, anorexia, nausea, vomiting, diarrhea  or change in bowel habits or change in bladder habits, change in stools or change in urine, dysuria, hematuria,  rash, arthralgias, visual complaints, headache, numbness, weakness or ataxia or problems with walking or coordination,  change in mood/affect or memory.        Current Meds  Medication Sig  . acetaminophen (TYLENOL) 500 MG tablet Take 500 mg by mouth every 6 (six) hours as needed (BREAK THROUGH PAIN).   Marland Kitchen albuterol (PROVENTIL) (2.5 MG/3ML) 0.083% nebulizer solution Take 3 mLs (2.5 mg total) by nebulization every 4 (four) hours as needed for wheezing or shortness of breath.  Marland Kitchen amLODipine (NORVASC) 2.5 MG tablet Take 2.5 mg by mouth daily.  Marland Kitchen aspirin 325 MG EC tablet Take 325 mg by  mouth daily.  . baclofen (LIORESAL) 10 MG tablet Take 10 mg by mouth at bedtime. Tablet at bedtime and every 8 hrs as needed  . bisacodyl (DULCOLAX) 5 MG EC tablet Take 1 tablet (5 mg total) by mouth daily as needed for moderate constipation.  . budesonide (PULMICORT) 0.5 MG/2ML nebulizer solution Take 0.5 mg by nebulization 2 (two) times daily.  . carvedilol (COREG) 25 MG tablet Take 25 mg by mouth 2 (two) times daily with a meal. Hold if sbp < 110  . Cholecalciferol (VITAMIN D-3 PO) Take 1,000 Units by mouth daily.  Marland Kitchen docusate sodium (COLACE) 100 MG capsule Take 100 mg by mouth 2 (two) times daily.  . dorzolamide-timolol (COSOPT) 22.3-6.8 MG/ML ophthalmic solution Place 1 drop into both eyes 2 (two) times daily.  Marland Kitchen FLUoxetine (PROZAC) 20 MG capsule Take 20 mg by mouth daily. Patient takes with 40 mg to equal 60 mg total dose  . furosemide (LASIX) 40 MG tablet Take 40 mg by mouth daily.   Marland Kitchen guaiFENesin (MUCINEX) 600 MG 12 hr tablet Take 600 mg by mouth 2 (two) times daily.  Marland Kitchen guaifenesin (ROBITUSSIN) 100 MG/5ML syrup Take 200 mg by mouth 3 (three) times daily as needed for cough.  Marland Kitchen HYDROcodone-acetaminophen (NORCO/VICODIN) 5-325 MG tablet Take 1 tablet by mouth every 6 (six) hours as needed. Hold if sleepy or confused   . ipratropium-albuterol (DUONEB) 0.5-2.5 (3) MG/3ML SOLN Take 3 mLs by nebulization every 8 (eight) hours.  Marland Kitchen levothyroxine (SYNTHROID) 88 MCG tablet Take 1 tablet (88 mcg total) by mouth daily before breakfast.  . LORazepam (ATIVAN) 0.5 MG tablet Take 0.75 mg by mouth daily.   Marland Kitchen  magnesium oxide (MAG-OX) 400 MG tablet Take 400 mg by mouth daily.   . meclizine (ANTIVERT) 25 MG tablet Take 25 mg by mouth daily as needed for dizziness.  . methylPREDNISolone (MEDROL DOSEPAK) 4 MG TBPK tablet As directed  . montelukast (SINGULAIR) 10 MG tablet Take 10 mg by mouth at bedtime.  . Multiple Vitamins-Minerals (CERTAVITE SENIOR/ANTIOXIDANT) TABS Take 1 tablet by mouth daily.  .  nitroGLYCERIN (NITROSTAT) 0.4 MG SL tablet Place 0.4 mg under the tongue every 5 (five) minutes as needed for chest pain. If pain is unrelived after 3 doses, call MD  . OLANZapine (ZYPREXA) 5 MG tablet Take 5 mg by mouth daily.   Marland Kitchen oxybutynin (DITROPAN) 5 MG tablet Take 5 mg by mouth 2 (two) times daily.   . OXYGEN Inhale into the lungs. Titrate 2-6 LPM to keep O2 stats > 93%  . pantoprazole (PROTONIX) 20 MG tablet Take 20 mg by mouth daily before breakfast. 1 hour before breakfast   . polyethylene glycol (MIRALAX / GLYCOLAX) packet Take 17 g by mouth daily.  . potassium chloride SA (K-DUR,KLOR-CON) 20 MEQ tablet Take 20 mEq by mouth daily. Reported on 04/04/2015  . senna-docusate (SENOKOT-S) 8.6-50 MG tablet Take 1 tablet by mouth daily.            Objective:   Physical Exam     w/c bound elderly wf mild voice fatgue   06/06/2017       199   02/22/17 203 lb 4.2 oz (92.2 kg)  06/25/16 204 lb 1.6 oz (92.6 kg)  05/29/16 204 lb 1.6 oz (92.6 kg)     Vital signs reviewed - Note on arrival 02 sats  96% on RA     HEENT: nl dentition, turbinates bilaterally, and oropharynx. Nl external ear canals without cough reflex   NECK :  without JVD/Nodes/TM/ nl carotid upstrokes bilaterally   LUNGS: no acc muscle use,  Nl contour chest with distant bs esp in R base without cough on insp or exp maneuvers   CV:  RRR  no s3 or murmur or increase in P2, and no edema   ABD:  Obese/ soft and nontender with limited  inspiratory excursion in the supine position. No bruits or organomegaly appreciated, bowel sounds nl  MS:   Ext warm without deformities, calf tenderness, cyanosis or clubbing No obvious joint restrictions   SKIN: warm and dry without lesions    NEURO:  alert, approp, nl sensorium with  no motor or cerebellar deficits apparent.      CXR PA and Lateral:   06/06/2017 :    I personally reviewed images and agree with radiology impression as follows:    No edema or consolidation.  Stable elevation of right hemidiaphragm. Stable cardiac silhouette.   Labs ordered/ reviewed:      Chemistry      Component Value Date/Time   NA 134 (L) 06/06/2017 1441   NA 144 04/05/2016   K 4.3 06/06/2017 1441   CL 100 06/06/2017 1441   CO2 24 06/06/2017 1441   BUN 25 (H) 06/06/2017 1441   BUN 15 04/05/2016   CREATININE 1.03 06/06/2017 1441   CREATININE 0.82 11/17/2013 1208   GLU 99 04/05/2016      Component Value Date/Time   CALCIUM 9.6 06/06/2017 1441                             Lab Results  Component Value Date  WBC 14.5 (H) 06/06/2017   HGB 14.4 06/06/2017   HCT 42.4 06/06/2017   MCV 89.6 06/06/2017   PLT 381.0 06/06/2017     Lab Results  Component Value Date   DDIMER 0.47 10/09/2013      Lab Results  Component Value Date   TSH 8.20 (H) 06/06/2017     Lab Results  Component Value Date   PROBNP 45.0 06/06/2017           Assessment:

## 2017-06-06 NOTE — Patient Instructions (Addendum)
Stop coreg and start bisoprolol 5 mg daily in its place  And add pepcid 20 mg at bedtime   Please remember to go to the lab and x-ray department downstairs in the basement  for your tests - we will call you with the results when they are available.   Please schedule a follow up office visit in 4 weeks, sooner if needed

## 2017-06-08 ENCOUNTER — Encounter: Payer: Self-pay | Admitting: Internal Medicine

## 2017-06-08 DIAGNOSIS — I10 Essential (primary) hypertension: Secondary | ICD-10-CM | POA: Insufficient documentation

## 2017-06-08 NOTE — Assessment & Plan Note (Signed)
Present on cxr's dating back to 06/10/2007  > no change 06/06/2017

## 2017-06-08 NOTE — Assessment & Plan Note (Addendum)
In the setting of respiratory symptoms of unknown etiology,  It would be preferable to use bystolic, the most beta -1  selective Beta blocker available in sample form, with bisoprolol the most selective generic choice  on the market, at least on a trial basis, to make sure the spillover Beta 2 effects of the less specific Beta blockers are not contributing to this patient's symptoms.   Try bisoprolol 5 mg daily in place of coreg

## 2017-06-08 NOTE — Assessment & Plan Note (Signed)
CTa 02/18/18 Retained material in the proximal esophagus with wall thickening suspected in the mid and distal esophagus, possibly esophagitis versus mucosal lesion. - max rx for gerd rec 04/26/2017 >  Improved 06/06/2017 > added pepcid at hs    Next step would be  MBS with DgEs if any recurrence on gerd rx

## 2017-06-08 NOTE — Assessment & Plan Note (Signed)
Lab Results  Component Value Date   TSH 8.20 (H) 06/06/2017     Trending up so consider higher replacement > Follow up per Primary Care planned

## 2017-06-08 NOTE — Assessment & Plan Note (Addendum)
Symptoms are   disproportionate to objective findings and not clear to what extent this is actually a pulmonary  problem but pt does appear to have difficult to sort out respiratory symptoms of unknown origin for which  DDX  = almost all start with A and  include Adherence, Ace Inhibitors, Acid Reflux, Active Sinus Disease, Alpha 1 Antitripsin deficiency, Anxiety masquerading as Airways dz,  ABPA,  Allergy(esp in young), Aspiration (esp in elderly), Adverse effects of meds,  Active smokers, A bunch of PE's/clot burden (a few small clots can't cause this syndrome unless there is already severe underlying pulm or vascular dz with poor reserve),  Anemia or thyroid disorder, plus two Bs  = Bronchiectasis and Beta blocker use..and one C= CHF    ? Acid (or non-acid) GERD > always difficult to exclude as up to 75% of pts in some series report no assoc GI/ Heartburn symptoms> rec max (24h)  acid suppression and diet restrictions/ reviewed and instructions given in writing. rec Add pepcid at hs to ppi qam ac  ? Aspiration > consider repeat Speech therapy eval/ MBS next   ? Allergy/ asthma > suggested by reported responset to saba > continue prn  ? Anemia/ thyroid dz > not anemic but TSH trending up > will advise NH to consider higher dose T4  ? Anxiety > usually at the bottom of this list of usual suspects but should be much higher on this pt's based on H and P and note already on psychotropics and may interfere with adherence and also interpretation of response or lack thereof to symptom management which can be quite subjective.  ? Beta blocker effects > see hbp - also on timolol eyedrops> may need to consider alternative (betoptic)  ? chf > excluded with bnp so low   No additional recs at this point  I had an extended discussion with the patient reviewing all relevant studies completed to date and  lasting 25 minutes of a 40  minute office visit with complex pt  with  severe non-specific but  potentially very serious refractory respiratory symptoms of uncertain and potentially multiple  etiologies.   Each maintenance medication was reviewed in detail including most importantly the difference between maintenance and prns and under what circumstances the prns are to be triggered using an action plan format that is not reflected in the computer generated alphabetically organized AVS.    Please see AVS for specific instructions unique to this office visit that I personally wrote and verbalized to the the pt in detail and then reviewed with pt  by my nurse highlighting any changes in therapy/plan of care  recommended at today's visit.

## 2017-06-10 NOTE — Progress Notes (Signed)
Spoke with the pt's son and notified of results

## 2017-06-10 NOTE — Progress Notes (Signed)
Spoke with the pt's son and notified of results/recs  [facility]

## 2017-06-24 ENCOUNTER — Inpatient Hospital Stay (HOSPITAL_COMMUNITY)
Admission: EM | Admit: 2017-06-24 | Discharge: 2017-07-20 | DRG: 871 | Disposition: E | Payer: Medicare Other | Attending: Internal Medicine | Admitting: Internal Medicine

## 2017-06-24 ENCOUNTER — Emergency Department (HOSPITAL_COMMUNITY): Payer: Medicare Other

## 2017-06-24 ENCOUNTER — Encounter (HOSPITAL_COMMUNITY): Payer: Self-pay | Admitting: Emergency Medicine

## 2017-06-24 ENCOUNTER — Other Ambulatory Visit: Payer: Self-pay

## 2017-06-24 DIAGNOSIS — Z66 Do not resuscitate: Secondary | ICD-10-CM | POA: Diagnosis present

## 2017-06-24 DIAGNOSIS — Y95 Nosocomial condition: Secondary | ICD-10-CM | POA: Diagnosis present

## 2017-06-24 DIAGNOSIS — Z8249 Family history of ischemic heart disease and other diseases of the circulatory system: Secondary | ICD-10-CM | POA: Diagnosis not present

## 2017-06-24 DIAGNOSIS — R1310 Dysphagia, unspecified: Secondary | ICD-10-CM | POA: Diagnosis not present

## 2017-06-24 DIAGNOSIS — J189 Pneumonia, unspecified organism: Secondary | ICD-10-CM | POA: Diagnosis present

## 2017-06-24 DIAGNOSIS — D631 Anemia in chronic kidney disease: Secondary | ICD-10-CM | POA: Diagnosis present

## 2017-06-24 DIAGNOSIS — K219 Gastro-esophageal reflux disease without esophagitis: Secondary | ICD-10-CM | POA: Diagnosis present

## 2017-06-24 DIAGNOSIS — F444 Conversion disorder with motor symptom or deficit: Secondary | ICD-10-CM | POA: Diagnosis present

## 2017-06-24 DIAGNOSIS — Z515 Encounter for palliative care: Secondary | ICD-10-CM | POA: Diagnosis not present

## 2017-06-24 DIAGNOSIS — Z9181 History of falling: Secondary | ICD-10-CM | POA: Diagnosis not present

## 2017-06-24 DIAGNOSIS — R0602 Shortness of breath: Secondary | ICD-10-CM

## 2017-06-24 DIAGNOSIS — N183 Chronic kidney disease, stage 3 unspecified: Secondary | ICD-10-CM | POA: Diagnosis present

## 2017-06-24 DIAGNOSIS — E785 Hyperlipidemia, unspecified: Secondary | ICD-10-CM | POA: Diagnosis present

## 2017-06-24 DIAGNOSIS — A419 Sepsis, unspecified organism: Principal | ICD-10-CM

## 2017-06-24 DIAGNOSIS — Z9981 Dependence on supplemental oxygen: Secondary | ICD-10-CM | POA: Diagnosis not present

## 2017-06-24 DIAGNOSIS — R05 Cough: Secondary | ICD-10-CM | POA: Diagnosis present

## 2017-06-24 DIAGNOSIS — R52 Pain, unspecified: Secondary | ICD-10-CM

## 2017-06-24 DIAGNOSIS — Z880 Allergy status to penicillin: Secondary | ICD-10-CM | POA: Diagnosis not present

## 2017-06-24 DIAGNOSIS — E440 Moderate protein-calorie malnutrition: Secondary | ICD-10-CM | POA: Diagnosis present

## 2017-06-24 DIAGNOSIS — I130 Hypertensive heart and chronic kidney disease with heart failure and stage 1 through stage 4 chronic kidney disease, or unspecified chronic kidney disease: Secondary | ICD-10-CM | POA: Diagnosis present

## 2017-06-24 DIAGNOSIS — M797 Fibromyalgia: Secondary | ICD-10-CM | POA: Diagnosis present

## 2017-06-24 DIAGNOSIS — R197 Diarrhea, unspecified: Secondary | ICD-10-CM | POA: Diagnosis not present

## 2017-06-24 DIAGNOSIS — F411 Generalized anxiety disorder: Secondary | ICD-10-CM | POA: Diagnosis present

## 2017-06-24 DIAGNOSIS — E46 Unspecified protein-calorie malnutrition: Secondary | ICD-10-CM

## 2017-06-24 DIAGNOSIS — I5032 Chronic diastolic (congestive) heart failure: Secondary | ICD-10-CM | POA: Diagnosis present

## 2017-06-24 DIAGNOSIS — I10 Essential (primary) hypertension: Secondary | ICD-10-CM | POA: Diagnosis present

## 2017-06-24 DIAGNOSIS — Q791 Other congenital malformations of diaphragm: Secondary | ICD-10-CM | POA: Diagnosis not present

## 2017-06-24 DIAGNOSIS — Z7189 Other specified counseling: Secondary | ICD-10-CM | POA: Diagnosis not present

## 2017-06-24 DIAGNOSIS — R058 Other specified cough: Secondary | ICD-10-CM | POA: Diagnosis present

## 2017-06-24 DIAGNOSIS — I480 Paroxysmal atrial fibrillation: Secondary | ICD-10-CM | POA: Diagnosis present

## 2017-06-24 DIAGNOSIS — Z7401 Bed confinement status: Secondary | ICD-10-CM | POA: Diagnosis not present

## 2017-06-24 DIAGNOSIS — R0902 Hypoxemia: Secondary | ICD-10-CM | POA: Diagnosis present

## 2017-06-24 DIAGNOSIS — D638 Anemia in other chronic diseases classified elsewhere: Secondary | ICD-10-CM | POA: Diagnosis not present

## 2017-06-24 DIAGNOSIS — H409 Unspecified glaucoma: Secondary | ICD-10-CM | POA: Diagnosis present

## 2017-06-24 DIAGNOSIS — J9621 Acute and chronic respiratory failure with hypoxia: Secondary | ICD-10-CM | POA: Diagnosis present

## 2017-06-24 DIAGNOSIS — Z6828 Body mass index (BMI) 28.0-28.9, adult: Secondary | ICD-10-CM

## 2017-06-24 DIAGNOSIS — Z807 Family history of other malignant neoplasms of lymphoid, hematopoietic and related tissues: Secondary | ICD-10-CM

## 2017-06-24 DIAGNOSIS — E669 Obesity, unspecified: Secondary | ICD-10-CM | POA: Diagnosis present

## 2017-06-24 DIAGNOSIS — J9601 Acute respiratory failure with hypoxia: Secondary | ICD-10-CM | POA: Diagnosis not present

## 2017-06-24 DIAGNOSIS — J181 Lobar pneumonia, unspecified organism: Secondary | ICD-10-CM | POA: Diagnosis not present

## 2017-06-24 DIAGNOSIS — E039 Hypothyroidism, unspecified: Secondary | ICD-10-CM | POA: Diagnosis present

## 2017-06-24 DIAGNOSIS — R6520 Severe sepsis without septic shock: Secondary | ICD-10-CM | POA: Diagnosis present

## 2017-06-24 DIAGNOSIS — R0682 Tachypnea, not elsewhere classified: Secondary | ICD-10-CM

## 2017-06-24 LAB — CBC WITH DIFFERENTIAL/PLATELET
Basophils Absolute: 0 10*3/uL (ref 0.0–0.1)
Basophils Relative: 0 %
Eosinophils Absolute: 0.2 10*3/uL (ref 0.0–0.7)
Eosinophils Relative: 1 %
HCT: 36.7 % (ref 36.0–46.0)
Hemoglobin: 11.7 g/dL — ABNORMAL LOW (ref 12.0–15.0)
LYMPHS ABS: 1.8 10*3/uL (ref 0.7–4.0)
Lymphocytes Relative: 10 %
MCH: 29.6 pg (ref 26.0–34.0)
MCHC: 31.9 g/dL (ref 30.0–36.0)
MCV: 92.9 fL (ref 78.0–100.0)
MONO ABS: 1.9 10*3/uL — AB (ref 0.1–1.0)
MONOS PCT: 11 %
NEUTROS PCT: 78 %
Neutro Abs: 13.6 10*3/uL — ABNORMAL HIGH (ref 1.7–7.7)
Platelets: 511 10*3/uL — ABNORMAL HIGH (ref 150–400)
RBC: 3.95 MIL/uL (ref 3.87–5.11)
RDW: 15.9 % — ABNORMAL HIGH (ref 11.5–15.5)
WBC: 17.5 10*3/uL — AB (ref 4.0–10.5)

## 2017-06-24 LAB — URINALYSIS, ROUTINE W REFLEX MICROSCOPIC
BILIRUBIN URINE: NEGATIVE
Glucose, UA: NEGATIVE mg/dL
HGB URINE DIPSTICK: NEGATIVE
Ketones, ur: NEGATIVE mg/dL
Leukocytes, UA: NEGATIVE
NITRITE: NEGATIVE
PROTEIN: NEGATIVE mg/dL
Specific Gravity, Urine: 1.013 (ref 1.005–1.030)
pH: 5 (ref 5.0–8.0)

## 2017-06-24 LAB — BASIC METABOLIC PANEL
Anion gap: 10 (ref 5–15)
BUN: 19 mg/dL (ref 6–20)
CHLORIDE: 106 mmol/L (ref 101–111)
CO2: 24 mmol/L (ref 22–32)
CREATININE: 1.07 mg/dL — AB (ref 0.44–1.00)
Calcium: 9.5 mg/dL (ref 8.9–10.3)
GFR, EST AFRICAN AMERICAN: 53 mL/min — AB (ref >60)
GFR, EST NON AFRICAN AMERICAN: 45 mL/min — AB (ref >60)
Glucose, Bld: 130 mg/dL — ABNORMAL HIGH (ref 65–99)
POTASSIUM: 4.3 mmol/L (ref 3.5–5.1)
Sodium: 140 mmol/L (ref 135–145)

## 2017-06-24 LAB — I-STAT TROPONIN, ED: Troponin i, poc: 0 ng/mL (ref 0.00–0.08)

## 2017-06-24 LAB — I-STAT CG4 LACTIC ACID, ED
LACTIC ACID, VENOUS: 1.17 mmol/L (ref 0.5–1.9)
Lactic Acid, Venous: 2.28 mmol/L (ref 0.5–1.9)

## 2017-06-24 MED ORDER — CERTAVITE SENIOR/ANTIOXIDANT PO TABS: 1.0000 | ORAL_TABLET | Freq: Every day | ORAL | Status: DC

## 2017-06-24 MED ORDER — ACETAMINOPHEN 325 MG PO TABS
650.0000 mg | ORAL_TABLET | Freq: Once | ORAL | Status: AC
  Administered 2017-06-24: 650 mg via ORAL
  Filled 2017-06-24: qty 2

## 2017-06-24 MED ORDER — OLANZAPINE 5 MG PO TABS
5.0000 mg | ORAL_TABLET | Freq: Every day | ORAL | Status: DC
  Administered 2017-06-25 – 2017-06-27 (×3): 5 mg via ORAL
  Filled 2017-06-24 (×6): qty 1

## 2017-06-24 MED ORDER — SACCHAROMYCES BOULARDII 250 MG PO CAPS
250.0000 mg | ORAL_CAPSULE | Freq: Two times a day (BID) | ORAL | Status: DC
  Administered 2017-06-24 – 2017-06-27 (×7): 250 mg via ORAL
  Filled 2017-06-24 (×7): qty 1

## 2017-06-24 MED ORDER — ENOXAPARIN SODIUM 40 MG/0.4ML ~~LOC~~ SOLN
40.0000 mg | SUBCUTANEOUS | Status: DC
  Administered 2017-06-24 – 2017-06-27 (×4): 40 mg via SUBCUTANEOUS
  Filled 2017-06-24 (×4): qty 0.4

## 2017-06-24 MED ORDER — POLYETHYLENE GLYCOL 3350 17 G PO PACK
17.0000 g | PACK | Freq: Every day | ORAL | Status: DC
  Administered 2017-06-25 – 2017-06-26 (×2): 17 g via ORAL
  Filled 2017-06-24 (×3): qty 1

## 2017-06-24 MED ORDER — FAMOTIDINE 20 MG PO TABS
20.0000 mg | ORAL_TABLET | Freq: Every day | ORAL | Status: DC
  Administered 2017-06-24 – 2017-06-27 (×4): 20 mg via ORAL
  Filled 2017-06-24 (×4): qty 1

## 2017-06-24 MED ORDER — SENNOSIDES-DOCUSATE SODIUM 8.6-50 MG PO TABS
2.0000 | ORAL_TABLET | Freq: Every day | ORAL | Status: DC
  Administered 2017-06-25 – 2017-06-27 (×3): 2 via ORAL
  Filled 2017-06-24 (×3): qty 2

## 2017-06-24 MED ORDER — LEVOTHYROXINE SODIUM 100 MCG PO TABS
100.0000 ug | ORAL_TABLET | Freq: Every day | ORAL | Status: DC
  Administered 2017-06-25 – 2017-06-27 (×3): 100 ug via ORAL
  Filled 2017-06-24 (×3): qty 1

## 2017-06-24 MED ORDER — BACLOFEN 5 MG HALF TABLET: 10.0000 mg | ORAL_TABLET | Freq: Three times a day (TID) | ORAL | Status: DC | PRN

## 2017-06-24 MED ORDER — FLUOXETINE HCL 20 MG PO CAPS
80.0000 mg | ORAL_CAPSULE | Freq: Every day | ORAL | Status: DC
  Administered 2017-06-25 – 2017-06-27 (×3): 80 mg via ORAL
  Filled 2017-06-24 (×3): qty 4

## 2017-06-24 MED ORDER — BACLOFEN 10 MG PO TABS
10.0000 mg | ORAL_TABLET | Freq: Every day | ORAL | Status: DC
  Administered 2017-06-24 – 2017-06-27 (×4): 10 mg via ORAL
  Filled 2017-06-24 (×4): qty 1

## 2017-06-24 MED ORDER — IPRATROPIUM-ALBUTEROL 0.5-2.5 (3) MG/3ML IN SOLN
3.0000 mL | Freq: Four times a day (QID) | RESPIRATORY_TRACT | Status: DC
  Administered 2017-06-24 – 2017-06-27 (×13): 3 mL via RESPIRATORY_TRACT
  Filled 2017-06-24 (×14): qty 3

## 2017-06-24 MED ORDER — HYDROCODONE-ACETAMINOPHEN 5-325 MG PO TABS: 1.0000 | ORAL_TABLET | Freq: Four times a day (QID) | ORAL | Status: DC | PRN

## 2017-06-24 MED ORDER — PHENYLEPHRINE-DM-GG 2.5-5-50 MG/5ML PO LIQD: 10.0000 mL | Freq: Four times a day (QID) | ORAL | Status: DC

## 2017-06-24 MED ORDER — ALBUTEROL SULFATE (2.5 MG/3ML) 0.083% IN NEBU
2.5000 mg | INHALATION_SOLUTION | RESPIRATORY_TRACT | Status: DC | PRN
  Administered 2017-06-24 – 2017-06-25 (×3): 2.5 mg via RESPIRATORY_TRACT
  Filled 2017-06-24 (×3): qty 3

## 2017-06-24 MED ORDER — DOCUSATE SODIUM 100 MG PO CAPS
100.0000 mg | ORAL_CAPSULE | Freq: Two times a day (BID) | ORAL | Status: DC
  Administered 2017-06-24 – 2017-06-27 (×6): 100 mg via ORAL
  Filled 2017-06-24 (×7): qty 1

## 2017-06-24 MED ORDER — ASPIRIN EC 325 MG PO TBEC
325.0000 mg | DELAYED_RELEASE_TABLET | Freq: Every day | ORAL | Status: DC
  Administered 2017-06-25 – 2017-06-27 (×3): 325 mg via ORAL
  Filled 2017-06-24 (×3): qty 1

## 2017-06-24 MED ORDER — GUAIFENESIN ER 600 MG PO TB12
600.0000 mg | ORAL_TABLET | Freq: Two times a day (BID) | ORAL | Status: DC
  Administered 2017-06-24 – 2017-06-27 (×7): 600 mg via ORAL
  Filled 2017-06-24 (×7): qty 1

## 2017-06-24 MED ORDER — ADULT MULTIVITAMIN W/MINERALS CH
1.0000 | ORAL_TABLET | Freq: Every day | ORAL | Status: DC
  Administered 2017-06-25 – 2017-06-27 (×3): 1 via ORAL
  Filled 2017-06-24 (×3): qty 1

## 2017-06-24 MED ORDER — ORAL CARE MOUTH RINSE
15.0000 mL | Freq: Two times a day (BID) | OROMUCOSAL | Status: DC
  Administered 2017-06-24 – 2017-07-02 (×12): 15 mL via OROMUCOSAL

## 2017-06-24 MED ORDER — PANTOPRAZOLE SODIUM 20 MG PO TBEC
20.0000 mg | DELAYED_RELEASE_TABLET | Freq: Every day | ORAL | Status: DC
  Administered 2017-06-25 – 2017-06-27 (×3): 20 mg via ORAL
  Filled 2017-06-24 (×3): qty 1

## 2017-06-24 MED ORDER — OXYBUTYNIN CHLORIDE 5 MG PO TABS
5.0000 mg | ORAL_TABLET | Freq: Two times a day (BID) | ORAL | Status: DC
  Administered 2017-06-24 – 2017-06-27 (×7): 5 mg via ORAL
  Filled 2017-06-24 (×7): qty 1

## 2017-06-24 MED ORDER — BISOPROLOL FUMARATE 5 MG PO TABS
5.0000 mg | ORAL_TABLET | Freq: Every day | ORAL | Status: DC
  Administered 2017-06-25 – 2017-06-27 (×3): 5 mg via ORAL
  Filled 2017-06-24 (×3): qty 1

## 2017-06-24 MED ORDER — SODIUM CHLORIDE 0.9 % IV SOLN
INTRAVENOUS | Status: DC
  Administered 2017-06-24 – 2017-06-25 (×2): via INTRAVENOUS

## 2017-06-24 MED ORDER — BISACODYL 5 MG PO TBEC: 5.0000 mg | DELAYED_RELEASE_TABLET | Freq: Every day | ORAL | Status: DC | PRN

## 2017-06-24 MED ORDER — GUAIFENESIN-DM 100-10 MG/5ML PO SYRP
10.0000 mL | ORAL_SOLUTION | Freq: Four times a day (QID) | ORAL | Status: DC
  Administered 2017-06-24 – 2017-06-28 (×12): 10 mL via ORAL
  Filled 2017-06-24 (×13): qty 10

## 2017-06-24 MED ORDER — AZTREONAM 2 G IJ SOLR
2.0000 g | Freq: Once | INTRAMUSCULAR | Status: AC
  Administered 2017-06-24: 2 g via INTRAVENOUS
  Filled 2017-06-24: qty 2

## 2017-06-24 MED ORDER — DORZOLAMIDE HCL-TIMOLOL MAL 2-0.5 % OP SOLN
1.0000 [drp] | Freq: Two times a day (BID) | OPHTHALMIC | Status: DC
  Administered 2017-06-24 – 2017-06-27 (×7): 1 [drp] via OPHTHALMIC
  Filled 2017-06-24: qty 10

## 2017-06-24 MED ORDER — SODIUM CHLORIDE 0.9 % IV SOLN
1.0000 g | Freq: Three times a day (TID) | INTRAVENOUS | Status: DC
  Administered 2017-06-24 – 2017-06-26 (×5): 1 g via INTRAVENOUS
  Filled 2017-06-24 (×6): qty 1

## 2017-06-24 MED ORDER — VANCOMYCIN HCL 10 G IV SOLR
1750.0000 mg | Freq: Once | INTRAVENOUS | Status: AC
  Administered 2017-06-24: 1750 mg via INTRAVENOUS
  Filled 2017-06-24: qty 1750

## 2017-06-24 MED ORDER — LORAZEPAM 0.5 MG PO TABS
0.7500 mg | ORAL_TABLET | Freq: Every day | ORAL | Status: DC | PRN
  Administered 2017-06-25 – 2017-06-27 (×4): 0.75 mg via ORAL
  Filled 2017-06-24 (×4): qty 2

## 2017-06-24 MED ORDER — IPRATROPIUM-ALBUTEROL 0.5-2.5 (3) MG/3ML IN SOLN
3.0000 mL | Freq: Once | RESPIRATORY_TRACT | Status: AC
  Administered 2017-06-24: 3 mL via RESPIRATORY_TRACT
  Filled 2017-06-24: qty 3

## 2017-06-24 MED ORDER — CLINDAMYCIN PHOSPHATE 600 MG/50ML IV SOLN
600.0000 mg | Freq: Three times a day (TID) | INTRAVENOUS | Status: DC
  Administered 2017-06-25 – 2017-06-26 (×5): 600 mg via INTRAVENOUS
  Filled 2017-06-24 (×4): qty 50

## 2017-06-24 MED ORDER — BUDESONIDE 0.5 MG/2ML IN SUSP
0.5000 mg | Freq: Two times a day (BID) | RESPIRATORY_TRACT | Status: DC
Start: 2017-06-24 — End: 2017-06-28
  Administered 2017-06-24 – 2017-06-28 (×8): 0.5 mg via RESPIRATORY_TRACT
  Filled 2017-06-24 (×9): qty 2

## 2017-06-24 MED ORDER — VANCOMYCIN HCL 10 G IV SOLR
1250.0000 mg | INTRAVENOUS | Status: DC
  Administered 2017-06-25: 1250 mg via INTRAVENOUS
  Filled 2017-06-24 (×2): qty 1250

## 2017-06-24 MED ORDER — SODIUM CHLORIDE 0.9 % IV BOLUS (SEPSIS)
1000.0000 mL | Freq: Once | INTRAVENOUS | Status: AC
  Administered 2017-06-24: 1000 mL via INTRAVENOUS

## 2017-06-24 NOTE — ED Notes (Signed)
Regular diet dinner tray ordered 

## 2017-06-24 NOTE — ED Triage Notes (Signed)
Pt arrives by [facility] from [facility] with shortness of breath and low o2 saturations. Pt reports history of pneumonia and UTI. From reviewing pts med list it appears she was recently taking Rocephin as well as Macrobid. Pt is alert and oriented to person, place and situation. Pt denies any pain. Pt received  albuterol and 0.5mg  Atrovent by ems still receiving this on arrival. Pt O2 sat's 94% on 10L 02 via neb mask.

## 2017-06-24 NOTE — Progress Notes (Signed)
Pt taken off NRB and placed on 10L HFNC at this time. VS within normal limits Slight increased WOB. ED PA at bedside. No bipap at this time but RT will continue to closely monitor

## 2017-06-24 NOTE — Progress Notes (Signed)
Placed pt on NRB per MD request. Pt Spo2 dropped to 84% but quickly improved to 93% on NRB. Pt having difficulty speaking in complete sentences due to SOB and increased WOB.

## 2017-06-24 NOTE — ED Provider Notes (Signed)
[facility] EMERGENCY DEPARTMENT Provider Note   CSN: [id number] Arrival date & time: 06/22/2017  1302     History   Chief Complaint Chief Complaint  Patient presents with  . Shortness of Breath    HPI Debra Johnston is a 84 y.o. female.  HPI 84 year old Caucasian female past medical history significant for CHF last EF was 60%, chronic pain, CKD, fibromyalgia, hypertension, hyperlipidemia, paroxysmal A. fib not currently anticoagulated that presents to the emergency department today from nursing facility for increased cough, fever, increased work of breathing.  I did speak with the facility myself who was very unclear about the patient's medications.  They state that she was diagnosed with pneumonia this past Friday.  The facility states that she was started on Levaquin and she has been taking this daily but they are not sure if she had it over the weekend.  They also are not sure patient was given Rocephin however the nurse states it is possible.  States that today they noted her oxygen saturation to be very low and patient complained of some shortness of breath with increased work of breathing.  She was transported to the ED for evaluation.  Patient reports chest pain, some cough with yellow sputum production.  She also reports fevers and chills.  Patient denies any associated chest pain.  Patient denies any pain at this time.  She did receive a DuoNeb in route by EMS.  On arrival she was on 10 L of O2 oxygen satting at 87%.  Pt denies any fever, chill, ha, vision changes, lightheadedness, dizziness, congestion, neck pain, cp,abd pain, n/v/d, urinary symptoms, change in bowel habits, melena, hematochezia, lower extremity paresthesias.   Past Medical History:  Diagnosis Date  . Anxiety   . Bronchitis 02/22/2011  . CHF (congestive heart failure) (HCC)    felt to have diastolic dysfunction with normal EF at 60%  . Chronic pain   . CKD (chronic kidney disease) stage 4,  GFR 15-29 ml/min (HCC)   . Depression   . DOE (dyspnea on exertion)    chronic   . Falls   . Fibromyalgia    with chronic weakness  . GERD (gastroesophageal reflux disease)   . Glaucoma   . Hyperlipidemia   . Hypertension   . Hypothyroidism   . Osteoarthritis   . PAF (paroxysmal atrial fibrillation) (HCC)    not felt to be a candidate for coumadin  . Physical deconditioning   . Pneumonia 02/2017    Patient Active Problem List   Diagnosis Date Noted  . Essential hypertension 06/08/2017  . Dyspnea on exertion 06/06/2017  . 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 Upper airway cough syndrome 04/26/2017  . Hallucinations   . Hypoxemia requiring supplemental oxygen   . HCAP (healthcare-associated pneumonia) 02/18/2017  . GAD (generalized anxiety disorder) 09/05/2015  . Urinary retention with incomplete bladder emptying 09/05/2015  . Anemia of chronic disease 09/05/2015  . Major depression, chronic 09/05/2015  . Hyperlipidemia 05/11/2015  . Obesity 05/11/2015  . Adjustment disorder with mixed anxiety and depressed mood 05/11/2015  . Other specified hypothyroidism 05/11/2015  . Iron deficiency 05/11/2015  . Intertrochanteric fracture of right hip (HCC) 05/11/2015  . Anemia 04/06/2015  . Protein-calorie malnutrition (HCC) 04/06/2015  . Preoperative cardiovascular examination   . Fracture, subtrochanteric, right femur, closed (HCC) 03/22/2015  . CKD (chronic kidney disease) stage 3, GFR 30-59 ml/min (HCC) 03/22/2015  . Oxygen dependent 03/22/2015  . Fibromyalgia affecting lower leg 03/16/2015  .  Hypertensive heart disease with CHF (congestive heart failure) (HCC) 07/01/2012  . PAF (paroxysmal atrial fibrillation) (HCC) 06/19/2011  . Hypothyroidism 06/19/2011  . Chronic diastolic CHF (congestive heart failure) (HCC) 03/12/2011    Past Surgical History:  Procedure Laterality Date  . APPENDECTOMY     as a teenage  . CESAREAN SECTION    . CHOLECYSTECTOMY     pt  denies having cholecystectomy  . FEMUR IM NAIL Right 03/23/2015   Procedure: INTRAMEDULLARY (IM) NAIL FEMORAL;  Surgeon: Durene Romans, MD;  Location: [facility];  Service: Orthopedics;  Laterality: Right;  . HEMORRHOID SURGERY    . KNEE SURGERY     bilateral  . LAPAROSCOPIC HYSTERECTOMY    . TRANSTHORACIC ECHOCARDIOGRAM  02/2011   EF 60% with diastolic dysfunction, high filling pressures     OB History   None      Home Medications    Prior to Admission medications   Medication Sig Start Date End Date Taking? Authorizing Provider  albuterol (PROVENTIL) (2.5 MG/3ML) 0.083% nebulizer solution Take 3 mLs (2.5 mg total) by nebulization every 4 (four) hours as needed for wheezing or shortness of breath. 04/01/15  Yes Richarda Overlie, MD  amLODipine (NORVASC) 2.5 MG tablet Take 2.5 mg by mouth daily.   Yes [provider]  aspirin 325 MG EC tablet Take 325 mg by mouth daily.   Yes [provider]  baclofen (LIORESAL) 10 MG tablet Take 10 mg by mouth at bedtime. May also take every 8 hours as needed for pain   Yes [provider]  bisacodyl (DULCOLAX) 5 MG EC tablet Take 1 tablet (5 mg total) by mouth daily as needed for moderate constipation. 04/01/15  Yes Richarda Overlie, MD  bisoprolol (ZEBETA) 5 MG tablet Take 5 mg by mouth daily.   Yes [provider]  budesonide (PULMICORT) 0.5 MG/2ML nebulizer solution Take 0.5 mg by nebulization 2 (two) times daily.   Yes [provider]  Cholecalciferol (VITAMIN D-3 PO) Take 1,000 Units by mouth daily.   Yes [provider]  docusate sodium (COLACE) 100 MG capsule Take 100 mg by mouth 2 (two) times daily.   Yes [provider]  dorzolamide-timolol (COSOPT) 22.3-6.8 MG/ML ophthalmic solution Place 1 drop into both eyes 2 (two) times daily.   Yes [provider]  famotidine (PEPCID) 20 MG tablet Take 20 mg by mouth at bedtime.   Yes [provider]  FLUoxetine (PROZAC) 40 MG capsule  Take 80 mg by mouth daily.   Yes [provider]  furosemide (LASIX) 40 MG tablet Take 40 mg by mouth daily.    Yes [provider]  guaiFENesin (MUCINEX) 600 MG 12 hr tablet Take 600 mg by mouth 2 (two) times daily.   Yes [provider]  HYDROcodone-acetaminophen (NORCO/VICODIN) 5-325 MG tablet Take 1 tablet by mouth every 6 (six) hours as needed for moderate pain. Hold if sleepy or confused    Yes [provider]  ipratropium-albuterol (DUONEB) 0.5-2.5 (3) MG/3ML SOLN Take 3 mLs by nebulization.   Yes [provider]  levothyroxine (SYNTHROID, LEVOTHROID) 100 MCG tablet Take 100 mcg by mouth daily before breakfast.   Yes [provider]  LORazepam (ATIVAN) 0.5 MG tablet Take 0.75 mg by mouth daily.    Yes [provider]  magnesium oxide (MAG-OX) 400 MG tablet Take 400 mg by mouth daily.    Yes [provider]  montelukast (SINGULAIR) 10 MG tablet Take 10 mg by mouth at  bedtime.   Yes [provider]  Multiple Vitamins-Minerals (CERTAVITE SENIOR/ANTIOXIDANT) TABS Take 1 tablet by mouth daily.   Yes [provider]  Multiple Vitamins-Minerals (PRESERVISION AREDS 2 PO) Take 1 tablet by mouth 2 (two) times daily.   Yes [provider]  nitroGLYCERIN (NITROSTAT) 0.4 MG SL tablet Place 0.4 mg under the tongue every 5 (five) minutes as needed for chest pain. If pain is unrelived after 3 doses, call MD   Yes [provider]  OLANZapine (ZYPREXA) 5 MG tablet Take 5 mg by mouth daily.  11/03/13  Yes [provider]  oxybutynin (DITROPAN) 5 MG tablet Take 5 mg by mouth 2 (two) times daily.    Yes [provider]  OXYGEN Inhale into the lungs. Titrate 2-6 LPM to keep O2 stats > 93%   Yes [provider]  pantoprazole (PROTONIX) 20 MG tablet Take 20 mg by mouth daily before breakfast. 1 hour before breakfast    Yes [provider]  Phenylephrine-DM-GG 2.06-23-48 MG/5ML  LIQD Take 10 mLs by mouth 4 (four) times daily.   Yes [provider]  polyethylene glycol (MIRALAX / GLYCOLAX) packet Take 17 g by mouth daily.   Yes [provider]  potassium chloride SA (K-DUR,KLOR-CON) 20 MEQ tablet Take 20 mEq by mouth daily. Reported on 04/04/2015   Yes [provider]  saccharomyces boulardii (FLORASTOR) 250 MG capsule Take 250 mg by mouth 2 (two) times daily.   Yes [provider]  senna-docusate (SENOKOT-S) 8.6-50 MG tablet Take 2 tablets by mouth daily.    Yes [provider]  levothyroxine (SYNTHROID) 88 MCG tablet Take 1 tablet (88 mcg total) by mouth daily before breakfast. Patient not taking: Reported on 2017-07-06 01/02/13   [place], Peter M, MD  meclizine (ANTIVERT) 25 MG tablet Take 25 mg by mouth daily as needed for dizziness.    [provider]    Family History Family History  Problem Relation Age of Onset  . Heart failure Mother   . Heart attack Mother   . Stroke Father   . Lymphoma Brother   . Cancer Brother     Social History Social History   Tobacco Use  . Smoking status: Never Smoker  . Smokeless tobacco: Never Used  Substance Use Topics  . Alcohol use: No  . Drug use: No     Allergies   Penicillins   Review of Systems Review of Systems  All other systems reviewed and are negative.    Physical Exam Updated Vital Signs BP 120/83   Pulse 71   Temp (!) 100.8 F (38.2 C) (Rectal)   Resp (!) 32   SpO2 90%   Physical Exam  Constitutional: She is oriented to person, place, and time. She appears well-developed and well-nourished.  Non-toxic appearance. No distress.  HENT:  Head: Normocephalic and atraumatic.  Nose: Nose normal.  Mouth/Throat: Oropharynx is clear and moist.  Eyes: Pupils are equal, round, and reactive to light. Conjunctivae are normal. Right eye exhibits no discharge. Left eye exhibits no discharge.  Neck: Normal range of motion. Neck supple. No JVD present.    Cardiovascular: Normal rate, regular rhythm, normal heart sounds and intact distal pulses. Exam reveals no gallop and no friction rub.  No murmur heard. Pulmonary/Chest: Tachypnea noted. She is in respiratory distress. She has no decreased breath sounds. She has wheezes. She has rhonchi (scattered). She exhibits no tenderness.  Patient with increased work of breathing.  Mild retractions were noted.  Patient's initial respirations were approximately 30-35.  Abdominal: Soft. Bowel sounds are normal. There is no tenderness. There is no rebound and no guarding.  Musculoskeletal: Normal range of motion. She exhibits no tenderness.       Right lower leg: Normal.       Left lower leg: Normal.  Lymphadenopathy:    She has no cervical adenopathy.  Neurological: She is alert and oriented to person, place, and time.  Skin: Skin is warm and dry. Capillary refill takes less than 2 seconds.  Psychiatric: Her behavior is normal. Judgment and thought content normal.  Nursing note and vitals reviewed.    ED Treatments / Results  Labs (all labs ordered are listed, but only abnormal results are displayed) Labs Reviewed  BASIC METABOLIC PANEL - Abnormal; Notable for the following components:      Result Value   Glucose, Bld 130 (*)    Creatinine, Ser 1.07 (*)    GFR calc non Af Amer 45 (*)    GFR calc Af Amer 53 (*)    All other components within normal limits  CBC WITH DIFFERENTIAL/PLATELET - Abnormal; Notable for the following components:   WBC 17.5 (*)    Hemoglobin 11.7 (*)    RDW 15.9 (*)    Platelets 511 (*)    All other components within normal limits  I-STAT CG4 LACTIC ACID, ED - Abnormal; Notable for the following components:   Lactic Acid, Venous 2.28 (*)    All other components within normal limits  CULTURE, BLOOD (ROUTINE X 2)  CULTURE, BLOOD (ROUTINE X 2)  URINE CULTURE  URINALYSIS, ROUTINE W REFLEX MICROSCOPIC  I-STAT TROPONIN, ED    EKG EKG  Interpretation  Date/Time:  Monday Jun 24 2017 13:13:30 EDT Ventricular Rate:  62 PR Interval:  172 QRS Duration: 88 QT Interval:  422 QTC Calculation: 428 R Axis:   -7 Text Interpretation:  Normal sinus rhythm Normal ECG No significant change since last tracing Confirmed by Drema Pry [id number]) on 07/14/2017 1:27:37 PM   Radiology Dg Chest Portable 1 View  Result Date: 07/09/2017 CLINICAL DATA:  Shortness of breath EXAM: PORTABLE CHEST 1 VIEW COMPARISON:  06/06/2017 FINDINGS: Check shadow is stable. Elevation of the right hemidiaphragm is again identified. Right upper lobe infiltrate is noted new from the prior exam. No acute bony abnormality is noted. IMPRESSION: Right upper lobe infiltrate Electronically Signed   By: Alcide Clever M.D.   On: 07/01/2017 13:58    Procedures .Critical Care Performed by: Rise Mu, PA-C Authorized by: Rise Mu, PA-C   Critical care provider statement:    Critical care time (minutes):  55   Critical care was necessary to treat or prevent imminent or life-threatening deterioration of the following conditions:  Sepsis   Critical care was time spent personally by me on the following activities:  Development of treatment plan with patient or surrogate, discussions with consultants, discussions with primary provider, evaluation of patient's response to treatment, examination of patient, obtaining history from patient or surrogate, ordering and performing treatments and interventions, ordering and review of laboratory studies, ordering and review of radiographic studies, pulse oximetry, re-evaluation of patient's condition and review of old charts   (including critical care time)  Medications Ordered in ED Medications  ipratropium-albuterol (DUONEB) 0.5-2.5 (3) MG/3ML nebulizer solution 3 mL (has no administration in time range)  vancomycin (VANCOCIN) 1,750 mg in sodium chloride 0.9 % 500 mL IVPB (1,750 mg Intravenous New Bag/Given 06/25/2017  1459)  vancomycin (  VANCOCIN) 1,250 mg in sodium chloride 0.9 % 250 mL IVPB (has no administration in time range)  aztreonam (AZACTAM) 1 g in sodium chloride 0.9 % 100 mL IVPB (has no administration in time range)  aztreonam (AZACTAM) 2 g in sodium chloride 0.9 % 100 mL IVPB (2 g Intravenous New Bag/Given 06/20/2017 1528)  sodium chloride 0.9 % bolus 1,000 mL (1,000 mLs Intravenous New Bag/Given 07/06/2017 1532)  acetaminophen (TYLENOL) tablet 650 mg (650 mg Oral Given 07/12/2017 1552)     Initial Impression / Assessment and Plan / ED Course  I have reviewed the triage vital signs and the nursing notes.  Pertinent labs & imaging results that were available during my care of the patient were reviewed by me and considered in my medical decision making (see chart for details).     Patient presents to the emergency department today from nursing facility for fevers, chills, cough, shortness of breath and hypoxia.  Facility states that she was diagnosed with pneumonia on Friday of last week.  She was started on Levaquin and possibly Rocephin.  Patient reports some shortness of breath and cough along with fevers and chills.   Initially patient's oral temp is 99.6.  As for rectal temp which was 100.8.  Patient has no hypotension.  She is not tachycardic.  Tachypnea noted.  Saturations were 87% on DuoNeb.  Patient with rhonchorous sounds throughout the entire lung fields.  She also has an expiratory wheezes.  Satting at 90% on 2 L of oxygen.  Patient also has increased work of breathing.  Heart regular rate and rhythm.  Neurovascular intact in all extremities.  No focal abdominal tenderness.  Bowel sounds present in all 4 quadrants.  Patient speaking in short complete sentences.  Lab work showed a leukocytosis of 17,000.  Patient's electrolytes are reassuring.  Negative troponin.  Lactic acid was 2.28.  UA is pending at this time.  Chest x-ray shows right upper infiltrate.  EKG shows normal sinus rhythm with any  signs of acute ischemia or tachycardia.  Code sepsis was initiated.  Patient's lactic acid is not above 4.  She is not hypotensive.  30 cc/kg fluid bolus was not given.  Patient was started on aztreonam and vancomycin given healthcare associated pneumonia with penicillin allergy..  Patient placed on high flow oxygen.  She is maintaining her saturations at 93%.  Her respirations have improved to 19-20.  Patient states that her breathing is improved.  She does not appear to have any significant tachypnea or increased work of breathing at this time.  She given 2 DuoNeb's.  Lung sounds improved.  Patient will be admitted to hospital service for further management and work-up.  She remains hemodynamic stable this time.  Spoke with Dr. Lafe Garin with hospital medicine who is agrees to admission and will see pt in the Ed and place admission orders.     Final Clinical Impressions(s) / ED Diagnoses   Final diagnoses:  Sepsis, due to unspecified organism [facility])  HCAP (healthcare-associated pneumonia)  Hypoxia    ED Discharge Orders    None       Wallace Keller 07/17/2017 1657    Nira Conn, MD 06/26/17 [phone]

## 2017-06-24 NOTE — H&P (Signed)
History and Physical    Debra Johnston ZOX:[id number] DOB: 15-Jan-1928 DOA: 07/07/2017  I have briefly reviewed the patient's prior medical records in [facility]  PCP: Patient, No Pcp Per  Patient coming from: SNF  Chief Complaint: Generalized weakness, shortness of breath  HPI: Debra Johnston is a 84 y.o. female with medical history significant of paroxysmal A. fib not on anticoagulation due to recurrent falls, chronic diastolic CHF, chronic kidney disease stage III, currently essentially bedbound, hypertension, hyperlipidemia, hypothyroidism, who is being brought to the hospital from her nursing home due to progressive weakness and shortness of breath.  Patient tells me that she has been feeling quite weak over the last few days, she was diagnosed with pneumonia about 3 4 days ago and was given an antibiotic, however she felt like she continued to be weak and short of breath and was eventually brought to the emergency room.  She denies any fever or chills.  Her son is at bedside, and when asked whether she has difficulties with eating they state that she sometimes coughs when she eats.  They also tell me that she has become so weak that she is barely able to cough anything up.  She is essentially bedbound, it is rare that she transfers to a wheelchair, and is unable to stand up at all.  She denies any fever or chills.  She denies any chest pain, denies any palpitations.  She denies any lightheadedness or dizziness.  She denies any muscle aches or sore throat.  She has no abdominal pain, nausea vomiting or diarrhea.  Other than the generalized weakness and shortness of breath she is feeling relatively well.  ED Course: In the emergency room patient was febrile to 100.8, tachypneic with initial respiratory rate in the 40s, requiring high flow nasal cannula which improved her respiratory status as well as tachypnea.  Blood pressure has been within normal limits in the 120s systolic.  She was found  to have a leukocytosis of 17.5, and a lactic acid of 2.2.  Her troponin was negative.  Chest x-ray showed right upper lobe infiltrate.  She was given vancomycin and aztreonam, IV fluids and we were asked to admit.  Review of Systems: As per HPI otherwise 10 point review of systems negative.   Past Medical History:  Diagnosis Date  . Anxiety   . Bronchitis 02/22/2011  . CHF (congestive heart failure) (HCC)    felt to have diastolic dysfunction with normal EF at 60%  . Chronic pain   . CKD (chronic kidney disease) stage 4, GFR 15-29 ml/min (HCC)   . Depression   . DOE (dyspnea on exertion)    chronic   . Falls   . Fibromyalgia    with chronic weakness  . GERD (gastroesophageal reflux disease)   . Glaucoma   . Hyperlipidemia   . Hypertension   . Hypothyroidism   . Osteoarthritis   . PAF (paroxysmal atrial fibrillation) (HCC)    not felt to be a candidate for coumadin  . Physical deconditioning   . Pneumonia 02/2017    Past Surgical History:  Procedure Laterality Date  . APPENDECTOMY     as a teenage  . CESAREAN SECTION    . CHOLECYSTECTOMY     pt denies having cholecystectomy  . FEMUR IM NAIL Right 03/23/2015   Procedure: INTRAMEDULLARY (IM) NAIL FEMORAL;  Surgeon: Durene Romans, MD;  Location: [facility];  Service: Orthopedics;  Laterality: Right;  . HEMORRHOID SURGERY    .  KNEE SURGERY     bilateral  . LAPAROSCOPIC HYSTERECTOMY    . TRANSTHORACIC ECHOCARDIOGRAM  02/2011   EF 60% with diastolic dysfunction, high filling pressures     reports that she has never smoked. She has never used smokeless tobacco. She reports that she does not drink alcohol or use drugs.  Allergies  Allergen Reactions  . Penicillins Swelling and Rash    Has patient had a PCN reaction causing immediate rash, facial/tongue/throat swelling, SOB or lightheadedness with hypotension: yes Has patient had a PCN reaction causing severe rash involving mucus membranes or skin necrosis: unk Has patient had a  PCN reaction that required hospitalization: unk Has patient had a PCN reaction occurring within the last 10 years: unk If all of the above answers are "NO", then may proceed with Cephalosporin use.     Family History  Problem Relation Age of Onset  . Heart failure Mother   . Heart attack Mother   . Stroke Father   . Lymphoma Brother   . Cancer Brother     Prior to Admission medications   Medication Sig Start Date End Date Taking? Authorizing Provider  albuterol (PROVENTIL) (2.5 MG/3ML) 0.083% nebulizer solution Take 3 mLs (2.5 mg total) by nebulization every 4 (four) hours as needed for wheezing or shortness of breath. 04/01/15  Yes Richarda Overlie, MD  amLODipine (NORVASC) 2.5 MG tablet Take 2.5 mg by mouth daily.   Yes [provider]  aspirin 325 MG EC tablet Take 325 mg by mouth daily.   Yes [provider]  baclofen (LIORESAL) 10 MG tablet Take 10 mg by mouth at bedtime. May also take every 8 hours as needed for pain   Yes [provider]  bisacodyl (DULCOLAX) 5 MG EC tablet Take 1 tablet (5 mg total) by mouth daily as needed for moderate constipation. 04/01/15  Yes Richarda Overlie, MD  bisoprolol (ZEBETA) 5 MG tablet Take 5 mg by mouth daily.   Yes [provider]  budesonide (PULMICORT) 0.5 MG/2ML nebulizer solution Take 0.5 mg by nebulization 2 (two) times daily.   Yes [provider]  Cholecalciferol (VITAMIN D-3 PO) Take 1,000 Units by mouth daily.   Yes [provider]  docusate sodium (COLACE) 100 MG capsule Take 100 mg by mouth 2 (two) times daily.   Yes [provider]  dorzolamide-timolol (COSOPT) 22.3-6.8 MG/ML ophthalmic solution Place 1 drop into both eyes 2 (two) times daily.   Yes [provider]  famotidine (PEPCID) 20 MG tablet Take 20 mg by mouth at bedtime.   Yes [provider]  FLUoxetine (PROZAC) 40 MG capsule Take 80 mg by mouth daily.   Yes [provider]  furosemide (LASIX)  40 MG tablet Take 40 mg by mouth daily.    Yes [provider]  guaiFENesin (MUCINEX) 600 MG 12 hr tablet Take 600 mg by mouth 2 (two) times daily.   Yes [provider]  HYDROcodone-acetaminophen (NORCO/VICODIN) 5-325 MG tablet Take 1 tablet by mouth every 6 (six) hours as needed for moderate pain. Hold if sleepy or confused    Yes [provider]  ipratropium-albuterol (DUONEB) 0.5-2.5 (3) MG/3ML SOLN Take 3 mLs by nebulization.   Yes [provider]  levothyroxine (SYNTHROID, LEVOTHROID) 100 MCG tablet Take 100 mcg by mouth daily before breakfast.   Yes [provider]  LORazepam (ATIVAN) 0.5 MG tablet Take 0.75 mg by mouth daily.    Yes [provider]  magnesium oxide (MAG-OX)  400 MG tablet Take 400 mg by mouth daily.    Yes [provider]  montelukast (SINGULAIR) 10 MG tablet Take 10 mg by mouth at bedtime.   Yes [provider]  Multiple Vitamins-Minerals (CERTAVITE SENIOR/ANTIOXIDANT) TABS Take 1 tablet by mouth daily.   Yes [provider]  Multiple Vitamins-Minerals (PRESERVISION AREDS 2 PO) Take 1 tablet by mouth 2 (two) times daily.   Yes [provider]  nitroGLYCERIN (NITROSTAT) 0.4 MG SL tablet Place 0.4 mg under the tongue every 5 (five) minutes as needed for chest pain. If pain is unrelived after 3 doses, call MD   Yes [provider]  OLANZapine (ZYPREXA) 5 MG tablet Take 5 mg by mouth daily.  11/03/13  Yes [provider]  oxybutynin (DITROPAN) 5 MG tablet Take 5 mg by mouth 2 (two) times daily.    Yes [provider]  OXYGEN Inhale into the lungs. Titrate 2-6 LPM to keep O2 stats > 93%   Yes [provider]  pantoprazole (PROTONIX) 20 MG tablet Take 20 mg by mouth daily before breakfast. 1 hour before breakfast    Yes [provider]  Phenylephrine-DM-GG 2.06-23-48 MG/5ML LIQD Take 10 mLs by mouth 4 (four) times daily.   Yes [provider]    polyethylene glycol (MIRALAX / GLYCOLAX) packet Take 17 g by mouth daily.   Yes [provider]  potassium chloride SA (K-DUR,KLOR-CON) 20 MEQ tablet Take 20 mEq by mouth daily. Reported on 04/04/2015   Yes [provider]  saccharomyces boulardii (FLORASTOR) 250 MG capsule Take 250 mg by mouth 2 (two) times daily.   Yes [provider]  senna-docusate (SENOKOT-S) 8.6-50 MG tablet Take 2 tablets by mouth daily.    Yes [provider]  levothyroxine (SYNTHROID) 88 MCG tablet Take 1 tablet (88 mcg total) by mouth daily before breakfast. Patient not taking: Reported on 07/01/2017 01/02/13   [place], Peter M, MD  meclizine (ANTIVERT) 25 MG tablet Take 25 mg by mouth daily as needed for dizziness.    [provider]    Physical Exam: Vitals:   07/16/2017 1515 07/11/2017 1600 07/11/2017 1615 07/03/2017 1630  BP: (!) 111/56 (!) 118/52 133/64 (!) 124/58  Pulse: 62 61 82 (!) 59  Resp: 19 (!) 24 (!) 35 (!) 22  Temp:      TempSrc:      SpO2: 90% 92% 95% 91%    Constitutional: No apparent distress, still somewhat tachypneic, pale and ill-appearing Eyes: PERRL, lids and conjunctivae normal ENMT: Mucous membranes are dry. Posterior pharynx clear of any exudate or lesions. Poor dentition.  Neck: normal, supple, no masses, no thyromegaly Respiratory: Right-sided rhonchi present, faint end expiratory wheezing bilaterally, no crackles.  Moves air well. Cardiovascular: Regular rate and rhythm, no murmurs / rubs / gallops. No extremity edema. 2+ pedal pulses.  Abdomen: no tenderness, no masses palpated. Bowel sounds positive.  Musculoskeletal: no clubbing / cyanosis.  Decreased muscle tone.  Skin: no rashes, lesions, ulcers. No induration Neurologic: CN 2-12 grossly intact. Strength 5/5 in all 4.  Psychiatric: Normal judgment and insight. Alert and oriented x 3. Normal mood.   Labs on Admission: I have personally reviewed following labs and imaging studies Chest x-ray  -right hemidiaphragm elevation with possible right upper infiltrate  CBC: Recent Labs  Lab 07/10/2017 1336  WBC 17.5*  NEUTROABS 13.6*  HGB 11.7*  HCT 36.7  MCV 92.9  PLT 511*   Basic Metabolic Panel: Recent Labs  Lab  07/03/2017 1336  NA 140  K 4.3  CL 106  CO2 24  GLUCOSE 130*  BUN 19  CREATININE 1.07*  CALCIUM 9.5   GFR: CrCl cannot be calculated (Unknown ideal weight.). Liver Function Tests: No results for input(s): AST, ALT, ALKPHOS, BILITOT, PROT, ALBUMIN in the last 168 hours. No results for input(s): LIPASE, AMYLASE in the last 168 hours. No results for input(s): AMMONIA in the last 168 hours. Coagulation Profile: No results for input(s): INR, PROTIME in the last 168 hours. Cardiac Enzymes: No results for input(s): CKTOTAL, CKMB, CKMBINDEX, TROPONINI in the last 168 hours. BNP (last 3 results) Recent Labs    06/06/17 1441  PROBNP 45.0   HbA1C: No results for input(s): HGBA1C in the last 72 hours. CBG: No results for input(s): GLUCAP in the last 168 hours. Lipid Profile: No results for input(s): CHOL, HDL, LDLCALC, TRIG, CHOLHDL, LDLDIRECT in the last 72 hours. Thyroid Function Tests: No results for input(s): TSH, T4TOTAL, FREET4, T3FREE, THYROIDAB in the last 72 hours. Anemia Panel: No results for input(s): VITAMINB12, FOLATE, FERRITIN, TIBC, IRON, RETICCTPCT in the last 72 hours. Urine analysis:    Component Value Date/Time   COLORURINE YELLOW 02/18/2017 1038   APPEARANCEUR HAZY (A) 02/18/2017 1038   LABSPEC 1.012 02/18/2017 1038   PHURINE 7.0 02/18/2017 1038   GLUCOSEU NEGATIVE 02/18/2017 1038   GLUCOSEU NEGATIVE 10/22/2013 0936   HGBUR NEGATIVE 02/18/2017 1038   BILIRUBINUR NEGATIVE 02/18/2017 1038   KETONESUR NEGATIVE 02/18/2017 1038   PROTEINUR NEGATIVE 02/18/2017 1038   UROBILINOGEN 0.2 10/22/2013 0936   NITRITE NEGATIVE 02/18/2017 1038   LEUKOCYTESUR NEGATIVE 02/18/2017 1038     Radiological Exams on Admission: Dg Chest Portable 1  View  Result Date: 07/03/2017 CLINICAL DATA:  Shortness of breath EXAM: PORTABLE CHEST 1 VIEW COMPARISON:  06/06/2017 FINDINGS: Check shadow is stable. Elevation of the right hemidiaphragm is again identified. Right upper lobe infiltrate is noted new from the prior exam. No acute bony abnormality is noted. IMPRESSION: Right upper lobe infiltrate Electronically Signed   By: Alcide Clever M.D.   On: 07/03/2017 13:58    EKG: Independently reviewed.  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    Essential hypertension   Sepsis due to Healthcare associated pneumonia versus aspiration pneumonia -Patient started on vancomycin and aztreonam, will add clindamycin to have anaerobic coverage given reports that patient has been choking and coughing with eating -Obtain speech consult -Blood cultures obtained the ED, sputum cultures, continue to monitor -Lactic acid elevated, she has received appropriate IV fluids -Placed on IV fluids overnight only and reassess fluid status in the morning given chronic diastolic CHF  Atrial fibrillation - patient's CHA2DS2-VASc Score for Stroke Risk is > 2, however not a candidate for anticoagulation per previous assessments due to recurrent falls -Continue full dose aspirin  Hypertension -Continue bisoprolol, hold Norvasc and Lasix for now while getting IV fluids  Chronic diastolic CHF -She appears quite dry on exam, continue fluids overnight, scheduled to stop in the morning, reassess volume  status prior to continuing  Chronic kidney disease stage III -Creatinine appears at baseline  Anemia of chronic disease -Hemoglobin is baseline, no evidence of bleed  Acute on chronic hypoxic respiratory failure -She uses oxygen chronically, currently on high flow due to underlying pneumonia -Wean off to her chronic levels as tolerated  Hypothyroidism -A month ago her TSH was elevated and her Synthroid dose appears to have been increased, recheck TSH in the morning  Severe deconditioning/functional paraplegia -Essentially bedbound, obtain PT/OT consultation  Continue the rest of medications for her multiple other chronic medical problems  DVT prophylaxis: Lovenox Code Status: DNR Family Communication: Son present at bedside Disposition Plan: Admit to stepdown Consults called: None    Admission status: Inpatient  At the time of admission, it appears that the appropriate admission status for this patient is INPATIENT. This is judged to be reasonable and necessary in order to provide the required high service intensity to ensure the patient's safety given the presenting symptoms, physical exam findings, and initial radiographic and laboratory data in the context of their chronic comorbidities. Current circumstances are HCAP, hypoxic respiratory failure, sepsis, and it is felt to place patient at high risk for further clinical deterioration threatening life, limb, or organ. Moreover, it is my clinical judgment that the patient will require inpatient hospital care spanning beyond 2 midnights from the point of admission and that early discharge would result in unnecessary risk of decompensation and readmission or threat to life, limb or bodily function.   Pamella Pert, MD [facility] Pager [phone]  If 7PM-7AM, please contact night-coverage [email or web address] Password [email or web address]  06/29/2017, 5:16 PM

## 2017-06-24 NOTE — Progress Notes (Addendum)
Pharmacy Antibiotic Note  Debra Johnston is a 84 y.o. female admitted on 06/23/2017 with pneumonia.  Pharmacy has been consulted for aztreonam/vancomycin dosing. Per RN at facility, patient was on levaquin x 5 days PTA and "may have gotten ceftriaxone" but she wasn't completely sure. Patient has hx of rash, swelling allergy to PCN and no other noted hx of beta lactam use in the chart. Per EDP, will be safe and use aztreonam for now. Afebrile. SCr pending on admit. Estimated weight ~90kg per most recent outpatient office visit note.  Plan: Aztreonam 2g IV x 1 Vancomycin  IV x 1 Monitor clinical progress, c/s, renal function F/u de-escalation plan/LOT, vancomycin trough as indicated F/u SCr to enter abx maintenance doses  ADDENDUM:  SCr 1.07 on admit, CrCl~50.  Plan: Continue aztreonam 1g IV q8h Continue vancomycin  IV q24h Monitor clinical progress, c/s, renal function F/u de-escalation plan/LOT, vancomycin trough as indicated      Temp (24hrs), Avg:99.6 F (37.6 C), Min:99.6 F (37.6 C), Max:99.6 F (37.6 C)  No results for input(s): WBC, CREATININE, LATICACIDVEN, VANCOTROUGH, VANCOPEAK, VANCORANDOM, GENTTROUGH, GENTPEAK, GENTRANDOM, TOBRATROUGH, TOBRAPEAK, TOBRARND, AMIKACINPEAK, AMIKACINTROU, AMIKACIN in the last 168 hours.  CrCl cannot be calculated (Unknown ideal weight.).    Allergies  Allergen Reactions  . Penicillins Swelling and Rash    Babs Bertin, PharmD, BCPS Clinical Pharmacist 06/21/2017 2:38 PM

## 2017-06-25 ENCOUNTER — Inpatient Hospital Stay (HOSPITAL_COMMUNITY): Payer: Medicare Other

## 2017-06-25 LAB — HIV ANTIBODY (ROUTINE TESTING W REFLEX): HIV Screen 4th Generation wRfx: NONREACTIVE

## 2017-06-25 LAB — BASIC METABOLIC PANEL
Anion gap: 8 (ref 5–15)
BUN: 18 mg/dL (ref 6–20)
CHLORIDE: 111 mmol/L (ref 101–111)
CO2: 23 mmol/L (ref 22–32)
Calcium: 9 mg/dL (ref 8.9–10.3)
Creatinine, Ser: 0.95 mg/dL (ref 0.44–1.00)
GFR calc Af Amer: >60 mL/min (ref >60)
GFR calc non Af Amer: 52 mL/min — ABNORMAL LOW (ref >60)
GLUCOSE: 119 mg/dL — AB (ref 65–99)
POTASSIUM: 3.8 mmol/L (ref 3.5–5.1)
Sodium: 142 mmol/L (ref 135–145)

## 2017-06-25 LAB — URINE CULTURE: Culture: NO GROWTH

## 2017-06-25 LAB — TSH: TSH: 2.038 u[IU]/mL (ref 0.350–4.500)

## 2017-06-25 LAB — CBC
HEMATOCRIT: 36.2 % (ref 36.0–46.0)
Hemoglobin: 11.2 g/dL — ABNORMAL LOW (ref 12.0–15.0)
MCH: 29.2 pg (ref 26.0–34.0)
MCHC: 30.9 g/dL (ref 30.0–36.0)
MCV: 94.5 fL (ref 78.0–100.0)
Platelets: 490 10*3/uL — ABNORMAL HIGH (ref 150–400)
RBC: 3.83 MIL/uL — ABNORMAL LOW (ref 3.87–5.11)
RDW: 16.4 % — AB (ref 11.5–15.5)
WBC: 17.4 10*3/uL — ABNORMAL HIGH (ref 4.0–10.5)

## 2017-06-25 LAB — STREP PNEUMONIAE URINARY ANTIGEN: Strep Pneumo Urinary Antigen: NEGATIVE

## 2017-06-25 LAB — MRSA PCR SCREENING: MRSA by PCR: NEGATIVE

## 2017-06-25 MED ORDER — FUROSEMIDE 10 MG/ML IJ SOLN
40.0000 mg | Freq: Once | INTRAMUSCULAR | Status: AC
  Administered 2017-06-25: 40 mg via INTRAVENOUS
  Filled 2017-06-25: qty 4

## 2017-06-25 MED ORDER — FUROSEMIDE 10 MG/ML IJ SOLN
40.0000 mg | Freq: Once | INTRAMUSCULAR | Status: AC
  Administered 2017-06-25: 40 mg via INTRAVENOUS

## 2017-06-25 MED ORDER — FUROSEMIDE 10 MG/ML IJ SOLN
INTRAMUSCULAR | Status: AC
  Filled 2017-06-25: qty 4

## 2017-06-25 NOTE — Evaluation (Signed)
Clinical/Bedside Swallow Evaluation Patient Details  Name: Debra Johnston MRN: [id number] Date of Birth: 06/22/1927  Today's Date: 06/25/2017 Time: SLP Start Time (ACUTE ONLY): 1033 SLP Stop Time (ACUTE ONLY): 1043 SLP Time Calculation (min) (ACUTE ONLY): 10 min  Past Medical History:  Past Medical History:  Diagnosis Date  . Anxiety   . Bronchitis 02/22/2011  . CHF (congestive heart failure) (HCC)    felt to have diastolic dysfunction with normal EF at 60%  . Chronic pain   . CKD (chronic kidney disease) stage 4, GFR 15-29 ml/min (HCC)   . Depression   . DOE (dyspnea on exertion)    chronic   . Falls   . Fibromyalgia    with chronic weakness  . GERD (gastroesophageal reflux disease)   . Glaucoma   . Hyperlipidemia   . Hypertension   . Hypothyroidism   . Osteoarthritis   . PAF (paroxysmal atrial fibrillation) (HCC)    not felt to be a candidate for coumadin  . Physical deconditioning   . Pneumonia 02/2017   Past Surgical History:  Past Surgical History:  Procedure Laterality Date  . APPENDECTOMY     as a teenage  . CESAREAN SECTION    . CHOLECYSTECTOMY     pt denies having cholecystectomy  . FEMUR IM NAIL Right 03/23/2015   Procedure: INTRAMEDULLARY (IM) NAIL FEMORAL;  Surgeon: Durene Romans, MD;  Location: [facility];  Service: Orthopedics;  Laterality: Right;  . HEMORRHOID SURGERY    . KNEE SURGERY     bilateral  . LAPAROSCOPIC HYSTERECTOMY    . TRANSTHORACIC ECHOCARDIOGRAM  02/2011   EF 60% with diastolic dysfunction, high filling pressures   HPI:  Pt is a 84 y.o. female with medical history significant of paroxysmal A. fib not on anticoagulation due to recurrent falls, chronic diastolic CHF, chronic kidney disease stage III, currently essentially bedbound, hypertension, hyperlipidemia, hypothyroidism, who is being brought to the hospital from her nursing home due to progressive weakness and shortness of breath. CXR wtih RUL infiltrate concerning for PNA.   Assessment  / Plan / Recommendation Clinical Impression  Pt has a functional appearing oropharyngeal swallow with noo vert signs of aspiration. She does however have an increased RR at baseline (high 20s to low 30s) that remains stable throughout intake, but does put her at increased risk for aspiration from decreased coordination of breath/swallow. Would continue with current diet but with use of general aspiration precautions and SLP f/u to assess for tolerance. SLP Visit Diagnosis: Dysphagia, unspecified (R13.10)    Aspiration Risk  Mild aspiration risk;Moderate aspiration risk    Diet Recommendation Dysphagia 3 (Mech soft);Thin liquid   Liquid Administration via: Cup;Straw Medication Administration: Whole meds with liquid Supervision: Intermittent supervision to cue for compensatory strategies;Staff to assist with self feeding Compensations: Slow rate;Small sips/bites Postural Changes: Seated upright at 90 degrees    Other  Recommendations Oral Care Recommendations: Oral care BID   Follow up Recommendations Skilled Nursing facility      Frequency and Duration min 2x/week  2 weeks       Prognosis Prognosis for Safe Diet Advancement: Good      Swallow Study   General HPI: Pt is a 84 y.o. female with medical history significant of paroxysmal A. fib not on anticoagulation due to recurrent falls, chronic diastolic CHF, chronic kidney disease stage III, currently essentially bedbound, hypertension, hyperlipidemia, hypothyroidism, who is being brought to the hospital from her nursing home due to progressive weakness and shortness of  breath. CXR wtih RUL infiltrate concerning for PNA. Type of Study: Bedside Swallow Evaluation Previous Swallow Assessment: none in chart Diet Prior to this Study: Dysphagia 3 (soft);Thin liquids Temperature Spikes Noted: Yes(100.9) Respiratory Status: Nasal cannula(HFNC) History of Recent Intubation: No Behavior/Cognition: Alert;Cooperative;Pleasant mood Oral  Cavity Assessment: Dry;Other (comment)(tongue is red) Oral Care Completed by SLP: No Oral Cavity - Dentition: Adequate natural dentition Vision: Functional for self-feeding Self-Feeding Abilities: Able to feed self Patient Positioning: Upright in bed Baseline Vocal Quality: Normal Volitional Cough: Strong Volitional Swallow: Able to elicit    Oral/Motor/Sensory Function Overall Oral Motor/Sensory Function: Within functional limits   Ice Chips Ice chips: Within functional limits Presentation: Spoon   Thin Liquid Thin Liquid: Within functional limits Presentation: Cup;Self Fed;Straw    Nectar Thick Nectar Thick Liquid: Not tested   Honey Thick Honey Thick Liquid: Not tested   Puree Puree: Within functional limits Presentation: Spoon   Solid   GO   Solid: Within functional limits Presentation: Self Georjean Mode 06/25/2017,11:19 AM   Maxcine Ham, M.A. CCC-SLP [phone]

## 2017-06-25 NOTE — Progress Notes (Signed)
PROGRESS NOTE  Debra Johnston WGN:[id number] DOB: 27-Apr-1927 DOA: 06/25/2017 PCP: Patient, No Pcp Per   LOS: 1 day   Brief Narrative / Interim history:  84 y.o. female with medical history significant of paroxysmal A. fib not on anticoagulation due to recurrent falls, chronic diastolic CHF, chronic kidney disease stage III, currently essentially bedbound, hypertension, hyperlipidemia, hypothyroidism, who is being brought to the hospital from her nursing home due to progressive weakness and shortness of breath. She was septic on admission due to HCAP, placed on broad spectrum antibiotics and was admitted to SDU due to tachypnea and need for high flow O2.  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    Essential hypertension   Sepsis due to Healthcare associated pneumonia versus aspiration pneumonia -patient started on Vanc/Aztreonam and Clindamycin given high suspicion for aspiration -remains somewhat tachypneic this morning -sepsis physiology improving, slight fluid overload on CXR this morning, stop IVF and will give Lasix x 1 -SLP evaluation pending -Blood cultures obtained the ED, sputum cultures, continue to monitor -lactic acidosis resolved   Atrial fibrillation - patient's CHA2DS2-VASc Score for Stroke Risk is > 2, however not a candidate for anticoagulation per previous assessments due to recurrent falls -Continue full dose aspirin  Hypertension -Continue bisoprolol, hold Norvasc, Lasix x 1 IV today   Chronic diastolic  CHF -Lasix x 1 today, reassess fluid status in am   Chronic kidney disease stage III -creatinine appears stable this morning, Continue to monitor given the fact that she got 1 Lasix  Anemia of chronic disease -Hemoglobin is stable  Acute on chronic hypoxic respiratory failure -She uses oxygen chronically, currently on high flow due to underlying pneumonia -Wean off to her chronic levels as tolerated -Chest x-ray with slight worsening of her multifocal pneumonia, continue supportive treatment, respiratory status still somewhat tenuous this morning  Hypothyroidism -A month ago her TSH was elevated and her Synthroid dose appears to have been increased, current TSH within normal limits at 2.0, continue current regimen  Severe deconditioning/functional paraplegia -Essentially bedbound, obtain PT/OT consultation  Continue the rest of medications for her multiple other chronic medical problems    DVT prophylaxis: Lovenox Code Status: DNR Family Communication: no family at bedside Disposition Plan: SNF when ready   Consultants:   None   Procedures:   None   Antimicrobials:  Vancomycin 5/6 >>  Aztreonam 5/6 >>  Clindamycin 5/6 >>   Subjective: -States that she is feeling weak still however appreciates some improvement when compared to last night.  Complains of her mouth being dry, denies overt shortness of breath, denies any chest pain  Objective: Vitals:   06/25/17 0403 06/25/17 0804 06/25/17 0809 06/25/17 0837  BP: (!) 108/55   132/69  Pulse: 65     Resp: (!) 31     Temp: 97.9 F (36.6 C)   98.1 F (36.7 C)  TempSrc: Oral   Oral  SpO2: 92% 92% 93%   Weight:      Height:        Intake/Output Summary (Last 24 hours) at  06/25/2017 1059 Last data filed at 06/25/2017 0343 Gross per 24 hour  Intake 1750 ml  Output 150 ml  Net 1600 ml   Filed Weights   06/22/2017 2149  Weight: 90.6 kg (199 lb 11.8 oz)    Examination:  Constitutional: Slightly  tachypneic Eyes: PERRL, lids and conjunctivae normal ENMT: Mucous membranes are dry Neck: normal, supple Respiratory: Bilateral rhonchi more on the right, faint crackles, no wheezing.  Increased respiratory effort Cardiovascular: Regular rate and rhythm, no murmurs / rubs / gallops. No LE edema. 2+ pedal pulses. No carotid bruits.  Abdomen: no tenderness. Bowel sounds positive.  Skin: no rashes Neurologic: Generally weak but nonfocal exam Psychiatric: Normal judgment and insight. Alert and oriented x 3. Normal mood.    Data Reviewed: I have independently reviewed following labs and imaging studies  Chest x-ray 5/7 worsening opacities on the left multifocal pneumonia versus asymmetric edema  CBC: Recent Labs  Lab 07/01/2017 1336 06/25/17 0557  WBC 17.5* 17.4*  NEUTROABS 13.6*  --   HGB 11.7* 11.2*  HCT 36.7 36.2  MCV 92.9 94.5  PLT 511* 490*   Basic Metabolic Panel: Recent Labs  Lab 07/01/2017 1336 06/25/17 0557  NA 140 142  K 4.3 3.8  CL 106 111  CO2 24 23  GLUCOSE 130* 119*  BUN 19 18  CREATININE 1.07* 0.95  CALCIUM 9.5 9.0   GFR: Estimated Creatinine Clearance: 50.9 mL/min (by C-G formula based on SCr of 0.95 mg/dL). Liver Function Tests: No results for input(s): AST, ALT, ALKPHOS, BILITOT, PROT, ALBUMIN in the last 168 hours. No results for input(s): LIPASE, AMYLASE in the last 168 hours. No results for input(s): AMMONIA in the last 168 hours. Coagulation Profile: No results for input(s): INR, PROTIME in the last 168 hours. Cardiac Enzymes: No results for input(s): CKTOTAL, CKMB, CKMBINDEX, TROPONINI in the last 168 hours. BNP (last 3 results) Recent Labs    06/06/17 1441  PROBNP 45.0   HbA1C: No results for input(s): HGBA1C in the last 72 hours. CBG: No results for input(s): GLUCAP in the last 168 hours. Lipid Profile: No results for input(s): CHOL, HDL, LDLCALC, TRIG, CHOLHDL, LDLDIRECT in the last 72 hours. Thyroid Function Tests: Recent Labs     06/25/17 0557  TSH 2.038   Anemia Panel: No results for input(s): VITAMINB12, FOLATE, FERRITIN, TIBC, IRON, RETICCTPCT in the last 72 hours. Urine analysis:    Component Value Date/Time   COLORURINE YELLOW 06/23/2017 1858   APPEARANCEUR CLEAR 07/16/2017 1858   LABSPEC 1.013 07/14/2017 1858   PHURINE 5.0 07/19/2017 1858   GLUCOSEU NEGATIVE 07/11/2017 1858   GLUCOSEU NEGATIVE 10/22/2013 0936   HGBUR NEGATIVE 07/11/2017 1858   BILIRUBINUR NEGATIVE 07/01/2017 1858   KETONESUR NEGATIVE 07/06/2017 1858   PROTEINUR NEGATIVE 07/16/2017 1858   UROBILINOGEN 0.2 10/22/2013 0936   NITRITE NEGATIVE 06/22/2017 1858   LEUKOCYTESUR NEGATIVE 06/29/2017 1858   Sepsis Labs: Invalid input(s): PROCALCITONIN, LACTICIDVEN  Recent Results (from the past 240 hour(s))  MRSA PCR Screening     Status: None   Collection Time: 07/06/2017 11:00 PM  Result Value Ref Range Status   MRSA by PCR NEGATIVE NEGATIVE Final    Comment:        The GeneXpert MRSA Assay (FDA approved for NASAL specimens only), is one component of a comprehensive MRSA colonization surveillance program. It is not intended to diagnose MRSA infection nor to guide or monitor treatment for MRSA infections. Performed at [facility], [address] [place],  [place] [address]       Radiology Studies: Dg Chest Port 1 View  Result Date: 06/25/2017 CLINICAL DATA:  Short of breath, tachypnea EXAM: PORTABLE CHEST 1 VIEW COMPARISON:  Portable chest x-ray of 07/20/17 FINDINGS: There are now diffuse lung opacities bilaterally left more numerous than right most consistent with multifocal pneumonia. No definite pleural effusion is seen. Cardiomegaly is stable. Some retraction of the tracheal air shadow toward the right lung apex may indicate scarring secondary to prior disease. There are degenerative changes noted in the left shoulder. IMPRESSION: 1. Worsening of diffuse lung opacities most consistent with progression multifocal  pneumonia. Recommend continued followup. 2. Stable mild cardiomegaly. Electronically Signed   By: Dwyane Dee M.D.   On: 06/25/2017 10:29   Dg Chest Portable 1 View  Result Date: 2017-07-20 CLINICAL DATA:  Shortness of breath EXAM: PORTABLE CHEST 1 VIEW COMPARISON:  06/06/2017 FINDINGS: Check shadow is stable. Elevation of the right hemidiaphragm is again identified. Right upper lobe infiltrate is noted new from the prior exam. No acute bony abnormality is noted. IMPRESSION: Right upper lobe infiltrate Electronically Signed   By: Alcide Clever M.D.   On: 07/20/17 13:58     Scheduled Meds: . aspirin  325 mg Oral Daily  . baclofen  10 mg Oral QHS  . bisoprolol  5 mg Oral Daily  . budesonide  0.5 mg Nebulization BID  . docusate sodium  100 mg Oral BID  . dorzolamide-timolol  1 drop Both Eyes BID  . enoxaparin (LOVENOX) injection  40 mg Subcutaneous Q24H  . famotidine  20 mg Oral QHS  . FLUoxetine  80 mg Oral Daily  . furosemide      . guaiFENesin  600 mg Oral BID  . guaiFENesin-dextromethorphan  10 mL Oral Q6H  . ipratropium-albuterol  3 mL Nebulization Q6H  . levothyroxine  100 mcg Oral QAC breakfast  . mouth rinse  15 mL Mouth Rinse BID  . multivitamin with minerals  1 tablet Oral Daily  . OLANZapine  5 mg Oral Daily  . oxybutynin  5 mg Oral BID  . pantoprazole  20 mg Oral QAC breakfast  . polyethylene glycol  17 g Oral Daily  . saccharomyces boulardii  250 mg Oral BID  . senna-docusate  2 tablet Oral Daily   Continuous Infusions: . aztreonam Stopped (06/25/17 0834)  . clindamycin (CLEOCIN) IV Stopped (06/25/17 0944)  . vancomycin      Pamella Pert, MD, PhD [facility] Pager [phone]  If 7PM-7AM, please contact night-coverage [email or web address] Password [email or web address] 06/25/2017, 10:59 AM

## 2017-06-25 NOTE — Progress Notes (Signed)
Dr. Derald Macleod paged the following at [phone]:  "2W10:[place],F. Pt. with increased SOB on 13L HFNC. Crackles throughout. recent bolus 5/6 due to sepsis. morning creat .95. Sats 85-90%."

## 2017-06-25 NOTE — Progress Notes (Signed)
OT Cancellation Note  Patient Details Name: Debra Johnston MRN: [id number] DOB: 11-28-1927   Cancelled Treatment:    Reason Eval/Treat Not Completed: Other (comment). Pt's current D/C plan is to return to SNF. No apparent immediate acute care OT needs, therefore will defer OT to SNF. If OT eval is needed please call Acute Rehab Dept. at [phone] or text page OT at [phone].   [facility] Rene Gonsoulin, OT/L  [phone] 06/25/2017 06/25/2017, 1:41 PM

## 2017-06-25 NOTE — Evaluation (Addendum)
Physical Therapy Evaluation Patient Details Name: Debra Johnston MRN: [id number] DOB: 07-05-1927 Today's Date: 06/25/2017   History of Present Illness  Pt is an 84 y.o. female admitted from SNF on 06/30/2017 due to progressive weakness and SOB; found to be septic due to HCAP vs. aspiration pneumonia. PMH includes a-fib (not on anticoag due to recurrent falls), CHF, CKD III, HTN, fibromyalgia, glaucoma.    Clinical Impression  Patient evaluated by Physical Therapy with no further acute PT needs identified. PTA, pt from [facility]; dependent for all ADLs and transferred with mechanical lift. Pt currently requiring maxA+2 to totalA for bed mobility; dependent for pericare due to bowel incontinence. Recommend use of mechanical lift for transfers with RN/NT staff. SpO2 88-89% on 15L HFNC (RN notified). All education has been completed and the patient has no further questions. PT is signing off. Thank you for this referral.    Follow Up Recommendations SNF    Equipment Recommendations  None recommended by PT    Recommendations for Other Services       Precautions / Restrictions Precautions Precautions: Fall Precaution Comments: HFNC Restrictions Weight Bearing Restrictions: No      Mobility  Bed Mobility Overal bed mobility: Needs Assistance Bed Mobility: Rolling Rolling: Max assist;+2 for physical assistance         General bed mobility comments: Pt with bowel incontinence, requiring maxA+2 to roll R/L for pericare. MaxA+2 for repositioning in bed  Transfers                 General transfer comment: Dependent for transfers at baseline  Ambulation/Gait                Stairs            Wheelchair Mobility    Modified Rankin (Stroke Patients Only)       Balance                                             Pertinent Vitals/Pain Pain Assessment: No/denies pain    Home Living Family/patient expects to be discharged to:: Skilled  nursing facility                 Additional Comments: Resides at [facility]; cannot remember last time OOB    Prior Function Level of Independence: Needs assistance   Gait / Transfers Assistance Needed: Reports she only gets OOB when she has to be transported for appointments; uses mechanical lift for this  ADL's / Homemaking Assistance Needed: Total care  Comments: Cannot remember last time she stood/ambulated     Hand Dominance   Dominant Hand: Right    Extremity/Trunk Assessment   Upper Extremity Assessment Upper Extremity Assessment: Generalized weakness    Lower Extremity Assessment Lower Extremity Assessment: Generalized weakness       Communication   Communication: No difficulties  Cognition Arousal/Alertness: Awake/alert Behavior During Therapy: Flat affect Overall Cognitive Status: Within Functional Limits for tasks assessed                                        General Comments      Exercises     Assessment/Plan    PT Assessment All further PT needs can be met in the next venue of care  PT Problem List Decreased strength;Decreased activity tolerance;Decreased balance;Decreased mobility;Decreased skin integrity       PT Treatment Interventions      PT Goals (Current goals can be found in the Care Plan section)  Acute Rehab PT Goals PT Goal Formulation: All assessment and education complete, DC therapy    Frequency     Barriers to discharge        Co-evaluation               AM-PAC PT "6 Clicks" Daily Activity  Outcome Measure Difficulty turning over in bed (including adjusting bedclothes, sheets and blankets)?: Unable Difficulty moving from lying on back to sitting on the side of the bed? : Unable Difficulty sitting down on and standing up from a chair with arms (e.g., wheelchair, bedside commode, etc,.)?: Unable Help needed moving to and from a bed to chair (including a wheelchair)?: Total Help needed  walking in hospital room?: Total Help needed climbing 3-5 steps with a railing? : Total 6 Click Score: 6    End of Session   Activity Tolerance: Patient limited by fatigue Patient left: in bed;with call bell/phone within reach Nurse Communication: Mobility status PT Visit Diagnosis: Other abnormalities of gait and mobility (R26.89)    Time: 3267-1245 PT Time Calculation (min) (ACUTE ONLY): 28 min   Charges:   PT Evaluation $PT Eval Moderate Complexity: 1 Mod PT Treatments $Therapeutic Activity: 8-22 mins   PT G Codes:       Mabeline Caras, PT, DPT Acute Rehab Services  Pager: [place] 06/25/2017, 3:42 PM

## 2017-06-26 DIAGNOSIS — J189 Pneumonia, unspecified organism: Secondary | ICD-10-CM

## 2017-06-26 LAB — BASIC METABOLIC PANEL
ANION GAP: 8 (ref 5–15)
BUN: 13 mg/dL (ref 6–20)
CHLORIDE: 108 mmol/L (ref 101–111)
CO2: 25 mmol/L (ref 22–32)
CREATININE: 0.96 mg/dL (ref 0.44–1.00)
Calcium: 9.1 mg/dL (ref 8.9–10.3)
GFR calc non Af Amer: 52 mL/min — ABNORMAL LOW (ref >60)
GFR, EST AFRICAN AMERICAN: 60 mL/min — AB (ref >60)
GLUCOSE: 153 mg/dL — AB (ref 65–99)
Potassium: 3.5 mmol/L (ref 3.5–5.1)
Sodium: 141 mmol/L (ref 135–145)

## 2017-06-26 LAB — CBC
HCT: 35.3 % — ABNORMAL LOW (ref 36.0–46.0)
HEMOGLOBIN: 10.9 g/dL — AB (ref 12.0–15.0)
MCH: 29.3 pg (ref 26.0–34.0)
MCHC: 30.9 g/dL (ref 30.0–36.0)
MCV: 94.9 fL (ref 78.0–100.0)
Platelets: 482 10*3/uL — ABNORMAL HIGH (ref 150–400)
RBC: 3.72 MIL/uL — AB (ref 3.87–5.11)
RDW: 16.3 % — ABNORMAL HIGH (ref 11.5–15.5)
WBC: 19.7 10*3/uL — ABNORMAL HIGH (ref 4.0–10.5)

## 2017-06-26 MED ORDER — ONDANSETRON HCL 4 MG/2ML IJ SOLN
4.0000 mg | Freq: Four times a day (QID) | INTRAMUSCULAR | Status: DC | PRN
  Administered 2017-06-26: 4 mg via INTRAVENOUS
  Filled 2017-06-26: qty 2

## 2017-06-26 MED ORDER — SODIUM CHLORIDE 0.9 % IV SOLN
2.0000 g | Freq: Two times a day (BID) | INTRAVENOUS | Status: DC
  Administered 2017-06-26 (×2): 2 g via INTRAVENOUS
  Filled 2017-06-26 (×3): qty 2

## 2017-06-26 MED ORDER — METRONIDAZOLE IN NACL 5-0.79 MG/ML-% IV SOLN
500.0000 mg | Freq: Three times a day (TID) | INTRAVENOUS | Status: DC
  Administered 2017-06-26 – 2017-06-27 (×3): 500 mg via INTRAVENOUS
  Filled 2017-06-26 (×4): qty 100

## 2017-06-26 NOTE — Progress Notes (Signed)
Pharmacy Antibiotic Note  Debra Johnston is a 84 y.o. female admitted on 20-Jul-2017 with pneumonia.  Pharmacy has been consulted for cefepime dosing.  Abx D#3 for PNA. Has swelling and rash to PCN. Per med report, has tolerated ceftriaxone recently. MRSA PCR negative. Afebrile, WBC 19.7, SCr 0.96 (stable), LA normalized, strep pneumo neg  Plan: Stop aztreonam, vancomycin, and clindamycin Start cefepime 2g IV Q12h Start Flagyl  IV Q8h Monitor clinical picture, renal function F/U abx deescalation / LOT   Height:  (177.8 cm) Weight: 199 lb 11.8 oz (90.6 kg) IBW/kg (Calculated) : 68.5  Temp (24hrs), Avg:97.8 F (36.6 C), Min:97.4 F (36.3 C), Max:98.2 F (36.8 C)  Recent Labs  Lab 2017/07/20 1336 Jul 20, 2017 1558 20-Jul-2017 1801 06/25/17 0557 06/26/17 0233  WBC 17.5*  --   --  17.4* 19.7*  CREATININE 1.07*  --   --  0.95 0.96  LATICACIDVEN  --  2.28* 1.17  --   --     Estimated Creatinine Clearance: 50.4 mL/min (by C-G formula based on SCr of 0.96 mg/dL).    Allergies  Allergen Reactions  . Penicillins Swelling and Rash    Per med list, has tolerated ceftriaxone in the past  Has patient had a PCN reaction causing immediate rash, facial/tongue/throat swelling, SOB or lightheadedness with hypotension: yes Has patient had a PCN reaction causing severe rash involving mucus membranes or skin necrosis: unk Has patient had a PCN reaction that required hospitalization: unk Has patient had a PCN reaction occurring within the last 10 years: unk If all of the above answers are "NO", then may proceed with Cephalosporin use.    Enzo Bi, PharmD, BCPS Clinical Pharmacist Pager [phone] 06/26/2017 9:56 AM

## 2017-06-26 NOTE — NC FL2 (Signed)
[place] MEDICAID FL2 LEVEL OF CARE SCREENING TOOL     IDENTIFICATION  Patient Name: Debra Johnston Birthdate: 1927-11-10 Sex: female Admission Date (Current Location): 07/11/2017  [facility] and [place] Number:  Producer, television/film/video and Address:  [facility] [place], [address] [place], [place] [address]      Provider Number: [id number]  Attending Physician Name and Address:  Burnadette Pop, MD  Relative Name and Phone Number:       Current Level of Care: Hospital Recommended Level of Care: Skilled Nursing Facility Prior Approval Number:    Date Approved/Denied:   PASRR Number: [id number]  Discharge Plan: SNF    Current Diagnoses: Patient Active Problem List   Diagnosis Date Noted  . Essential hypertension 06/08/2017  . Dyspnea on exertion 06/06/2017  . 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 Upper airway cough syndrome 04/26/2017  . Hallucinations   . Hypoxemia requiring supplemental oxygen   . HCAP (healthcare-associated pneumonia) 02/18/2017  . GAD (generalized anxiety disorder) 09/05/2015  . Urinary retention with incomplete bladder emptying 09/05/2015  . Anemia of chronic disease 09/05/2015  . Major depression, chronic 09/05/2015  . Hyperlipidemia 05/11/2015  . Obesity 05/11/2015  . Adjustment disorder with mixed anxiety and depressed mood 05/11/2015  . Other specified hypothyroidism 05/11/2015  . Iron deficiency 05/11/2015  . Intertrochanteric fracture of right hip (HCC) 05/11/2015  . Anemia 04/06/2015  . Protein-calorie malnutrition (HCC) 04/06/2015  . Preoperative cardiovascular examination   . Fracture, subtrochanteric, right femur, closed (HCC) 03/22/2015  . CKD (chronic kidney disease) stage 3, GFR 30-59 ml/min (HCC) 03/22/2015  . Oxygen dependent 03/22/2015  . Fibromyalgia affecting lower leg 03/16/2015  . Hypertensive heart disease with CHF (congestive heart failure) (HCC) 07/01/2012  . PAF (paroxysmal atrial  fibrillation) (HCC) 06/19/2011  . Hypothyroidism 06/19/2011  . Chronic diastolic CHF (congestive heart failure) (HCC) 03/12/2011    Orientation RESPIRATION BLADDER Height & Weight     Self, Time, Situation, Place  O2(see dc summary) Incontinent, External catheter Weight: 199 lb 11.8 oz (90.6 kg) Height:   (177.8 cm)  BEHAVIORAL SYMPTOMS/MOOD NEUROLOGICAL BOWEL NUTRITION STATUS      Continent Diet  AMBULATORY STATUS COMMUNICATION OF NEEDS Skin   Total Care Verbally Normal                       Personal Care Assistance Level of Assistance  Bathing, Dressing Bathing Assistance: Maximum assistance   Dressing Assistance: Maximum assistance     Functional Limitations Info             SPECIAL CARE FACTORS FREQUENCY  PT (By licensed PT), OT (By licensed OT)     PT Frequency: 5/wk OT Frequency: 5/wk            Contractures      Additional Factors Info  Code Status, Allergies, Isolation Precautions, Psychotropic Code Status Info: DNR Allergies Info: penicillins Psychotropic Info: prozac   Isolation Precautions Info: MRSA     Current Medications (06/26/2017):  This is the current hospital active medication list Current Facility-Administered Medications  Medication Dose Route Frequency Provider Last Rate Last Dose  . albuterol (PROVENTIL) (2.5 MG/3ML) 0.083% nebulizer solution 2.5 mg  2.5 mg Nebulization Q4H PRN Leatha Gilding, MD   2.5 mg at 06/25/17 2305  . aspirin EC tablet 325 mg  325 mg Oral Daily Leatha Gilding, MD   325 mg at 06/26/17 1016  . baclofen (LIORESAL) tablet 10  mg  10 mg Oral QHS Leatha Gilding, MD   10 mg at 06/25/17 2303  . baclofen (LIORESAL) tablet 10 mg  10 mg Oral Q8H PRN Leatha Gilding, MD      . bisacodyl (DULCOLAX) EC tablet 5 mg  5 mg Oral Daily PRN Leatha Gilding, MD      . bisoprolol (ZEBETA) tablet 5 mg  5 mg Oral Daily Leatha Gilding, MD   5 mg at 06/26/17 1016  . budesonide (PULMICORT) nebulizer solution 0.5  mg  0.5 mg Nebulization BID Leatha Gilding, MD   0.5 mg at 06/26/17 0733  . ceFEPIme (MAXIPIME) 2 g in sodium chloride 0.9 % 100 mL IVPB  2 g Intravenous Q12H Armandina Stammer, RPH   Stopped at 06/26/17 1240  . docusate sodium (COLACE) capsule 100 mg  100 mg Oral BID Leatha Gilding, MD   100 mg at 06/26/17 1016  . dorzolamide-timolol (COSOPT) 22.3-6.8 MG/ML ophthalmic solution 1 drop  1 drop Both Eyes BID Leatha Gilding, MD   1 drop at 06/26/17 1158  . enoxaparin (LOVENOX) injection 40 mg  40 mg Subcutaneous Q24H Leatha Gilding, MD   40 mg at 06/25/17 2303  . famotidine (PEPCID) tablet 20 mg  20 mg Oral QHS Leatha Gilding, MD   20 mg at 06/25/17 2304  . FLUoxetine (PROZAC) capsule 80 mg  80 mg Oral Daily Leatha Gilding, MD   80 mg at 06/26/17 1016  . guaiFENesin (MUCINEX) 12 hr tablet 600 mg  600 mg Oral BID Leatha Gilding, MD   600 mg at 06/26/17 1016  . guaiFENesin-dextromethorphan (ROBITUSSIN DM) 100-10 MG/5ML syrup 10 mL  10 mL Oral Q6H Gherghe, Costin M, MD   10 mL at 06/26/17 1158  . HYDROcodone-acetaminophen (NORCO/VICODIN) 5-325 MG per tablet 1 tablet  1 tablet Oral Q6H PRN Leatha Gilding, MD      . ipratropium-albuterol (DUONEB) 0.5-2.5 (3) MG/3ML nebulizer solution 3 mL  3 mL Nebulization Q6H Leatha Gilding, MD   3 mL at 06/26/17 0729  . levothyroxine (SYNTHROID, LEVOTHROID) tablet 100 mcg  100 mcg Oral QAC breakfast Leatha Gilding, MD   100 mcg at 06/26/17 0847  . LORazepam (ATIVAN) tablet 0.75 mg  0.75 mg Oral Daily PRN Leatha Gilding, MD   0.75 mg at 06/25/17 2313  . MEDLINE mouth rinse  15 mL Mouth Rinse BID Fanny Dance T, MD   15 mL at 06/26/17 1202  . metroNIDAZOLE (FLAGYL) IVPB 500 mg  500 mg Intravenous Q8H Armandina Stammer, [facility]   Stopped at 06/26/17 1202  . multivitamin with minerals tablet 1 tablet  1 tablet Oral Daily Leatha Gilding, MD   1 tablet at 06/26/17 1016  . OLANZapine (ZYPREXA) tablet 5 mg  5 mg Oral Daily Leatha Gilding,  MD   5 mg at 06/26/17 1158  . oxybutynin (DITROPAN) tablet 5 mg  5 mg Oral BID Leatha Gilding, MD   5 mg at 06/26/17 1017  . pantoprazole (PROTONIX) EC tablet 20 mg  20 mg Oral QAC breakfast Leatha Gilding, MD   20 mg at 06/26/17 0847  . polyethylene glycol (MIRALAX / GLYCOLAX) packet 17 g  17 g Oral Daily Leatha Gilding, MD   17 g at 06/26/17 1017  . saccharomyces boulardii (FLORASTOR) capsule 250 mg  250 mg Oral BID Leatha Gilding, MD   250 mg at 06/26/17 1017  .  senna-docusate (Senokot-S) tablet 2 tablet  2 tablet Oral Daily Leatha Gilding, MD   2 tablet at 06/26/17 1017     Discharge Medications: Please see discharge summary for a list of discharge medications.  Relevant Imaging Results:  Relevant Lab Results:   Additional Information SS#: [id number]  Burna Sis, LCSW

## 2017-06-26 NOTE — Progress Notes (Addendum)
PROGRESS NOTE    Debra Johnston  ZOX:[id number] DOB: [birth year] DOA: 06/23/2017 PCP: Patient, No Pcp Per   Brief Narrative: Patient is a 84 year old female with past medical history of proximal A. fib not on anticoagulation, chronic diastolic CHF, chronic kidney disease stage III, hypertension, osteoarthritis, hyperlipidemia, hypothyroidism who  sent from nursing home due to progressive weakness and shortness of breath.  Patient was suspected to have healthcare associated pneumonia versus aspiration pneumonia and was started on broad-spectrum antibiotics, high flow oxygen.   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 We will follow-up blood cultures, sputum culture.  Lactic acidosis resolved. Speech therapy following.  Recommended dysphagia 3 diet.  Respiratory status looks improved, she is not in any kind of distress but continues to require high flow oxygen. Leukocytosis worsened this morning.  Patient is afebrile.  Acute on chronic hypoxic respiratory failure: Uses oxygen at nursing home.  Last assessment showed slight worsening of her multifocal pneumonia.We will repeat  chest x-ray as appropriate.  History of Afib :patient's CHA2DS2-VASc Score for Stroke Risk is>  2,however not a candidate for anticoagulation per previous assessments due to recurrent falls. Continue full dose aspirin.  Hypertension: Currently on bisoprolol.  Blood pressure this morning stable.  History of chronic diastolic CHF: Currently euvolemic.    CKD stage III: Kidney function on baseline.  We will continue to monitor..  Anemia of chronic disease: Currently H and H is  Stable.  Hypothyroidism: TSH stable.  Continue Synthyroid  Severe deconditioning/functional paraplegia: History of osteoarthritis, back surgery.  She is bedbound.  PT/OT consulted.  Recommended to go back to SNF when ready.    DVT prophylaxis: Lovenox Code Status: DNR Family Communication: None present at the bedside Disposition Plan: SNF when ready    Consultants: None  Procedures: None  Antimicrobials: Vanco, aztreonam, clindamycin since 5/6-5/8 Cefepime and Flagyl since 5/8 Subjective: Patient seen and examined the pressure this morning.  She is not in respiratory distress.  Alert and oriented.  Continues to require high flow oxygen.  Complains of Some dry mouth.  Denies any shortness of breath or chest pain.  Objective: Vitals:   06/26/17 0143 06/26/17 0217 06/26/17 0256 06/26/17 0416  BP:    140/69  Pulse: 73 66 65 89  Resp: (!) 21 (!) 23 (!) 32 (!) 25  Temp:    97.8 F (36.6 C)  TempSrc:    Oral  SpO2: 92% 92% 90% (!) 88%  Weight:      Height:        Intake/Output Summary (Last 24 hours) at 06/26/2017 0948 Last data filed at 06/26/2017 0630 Gross per 24 hour  Intake 1728 ml  Output 2700 ml  Net -972 ml   Filed Weights   07/08/2017 2149  Weight: 90.6 kg (199 lb 11.8 oz)    Examination:  General exam: Not in distress,average built, chronically ill HEENT:PERRL,Oral mucosa moist, Ear/Nose normal on gross exam Respiratory system: Bilateral coarse breathing sounds, rhonchi/wheezes Cardiovascular system: S1 & S2 heard, RRR. No JVD, murmurs, rubs, gallops or clicks. No pedal  edema. Gastrointestinal system: Abdomen is nondistended, soft and nontender. No organomegaly or masses felt. Normal bowel sounds heard. Central nervous system: Alert and oriented. No focal neurological deficits. Extremities: No edema, no clubbing ,no cyanosis, distal peripheral pulses palpable. Skin: No rashes, lesions or ulcers,no icterus ,no pallor Psychiatry: Judgement and insight appear normal. Mood & affect appropriate.     Data Reviewed: I have personally reviewed following labs and imaging studies  CBC: Recent Labs  Lab 2017/07/22 1336 06/25/17 0557 06/26/17 0233  WBC 17.5* 17.4* 19.7*  NEUTROABS 13.6*  --   --   HGB 11.7* 11.2* 10.9*  HCT 36.7 36.2 35.3*  MCV 92.9 94.5 94.9  PLT 511* 490* 482*   Basic Metabolic Panel: Recent Labs  Lab 07/22/2017 1336 06/25/17 0557 06/26/17 0233  NA 140 142 141  K 4.3 3.8 3.5  CL 106 111 108  CO2 GLUCOSE 130* 119* 153*  BUN CREATININE 1.07* 0.95 0.96  CALCIUM 9.5 9.0 9.1   GFR: Estimated Creatinine Clearance: 50.4 mL/min (by C-G formula based on SCr of 0.96 mg/dL). Liver Function Tests: No results for input(s): AST, ALT, ALKPHOS, BILITOT, PROT, ALBUMIN in the last 168 hours. No results for input(s): LIPASE, AMYLASE in the last 168 hours. No results for input(s): AMMONIA in the last 168 hours. Coagulation Profile: No results for input(s): INR, PROTIME in the last 168 hours. Cardiac Enzymes: No results for input(s): CKTOTAL, CKMB, CKMBINDEX, TROPONINI in the last 168 hours. BNP (last 3 results) Recent Labs    06/06/17 1441  PROBNP 45.0   HbA1C: No results for input(s): HGBA1C in the last 72 hours. CBG: No results for input(s): GLUCAP in the last 168 hours. Lipid Profile: No results for input(s): CHOL, HDL, LDLCALC, TRIG, CHOLHDL, LDLDIRECT in the last 72 hours. Thyroid Function Tests: Recent Labs    06/25/17 0557  TSH 2.038   Anemia Panel: No results for input(s): VITAMINB12, FOLATE, FERRITIN,  TIBC, IRON, RETICCTPCT in the last 72 hours. Sepsis Labs: Recent Labs  Lab July 22, 2017 1558 07-22-17 1801  LATICACIDVEN 2.28* 1.17    Recent Results (from the past 240 hour(s))  Culture, blood (routine x 2)     Status: None (Preliminary result)   Collection Time: 07-22-17  2:03 PM  Result Value Ref Range Status   Specimen Description BLOOD RIGHT FOREARM  Final   Special Requests   Final    BOTTLES DRAWN AEROBIC AND ANAEROBIC Blood Culture adequate volume   Culture   Final    NO GROWTH 2 DAYS Performed at [facility], [address] [place], [place] [address]    Report Status PENDING  Incomplete  Culture, blood (routine x 2)     Status: None (Preliminary result)   Collection Time: 07/22/17  2:55 PM  Result Value Ref Range Status   Specimen Description BLOOD LEFT HAND  Final   Special Requests   Final    BOTTLES DRAWN AEROBIC AND ANAEROBIC Blood Culture adequate volume   Culture   Final    NO GROWTH 2 DAYS Performed at [facility], [address] [place], [place] [address]    Report Status PENDING  Incomplete  Urine culture     Status: None  Collection Time: 06/23/2017  6:05 PM  Result Value Ref Range Status   Specimen Description URINE, RANDOM  Final   Special Requests NONE  Final   Culture   Final    NO GROWTH Performed at [facility], [address] [place], [place] [address]    Report Status 06/25/2017 FINAL  Final  MRSA PCR Screening     Status: None   Collection Time: 07/06/2017 11:00 PM  Result Value Ref Range Status   MRSA by PCR NEGATIVE NEGATIVE Final    Comment:        The GeneXpert MRSA Assay (FDA approved for NASAL specimens only), is one component of a comprehensive MRSA colonization surveillance program. It is not intended to diagnose MRSA infection nor to guide or monitor treatment for MRSA infections. Performed at [facility], [address] [place], [place] [address]          Radiology Studies: Dg Chest Port 1  View  Result Date: 06/25/2017 CLINICAL DATA:  Short of breath, tachypnea EXAM: PORTABLE CHEST 1 VIEW COMPARISON:  Portable chest x-ray of 06/22/2017 FINDINGS: There are now diffuse lung opacities bilaterally left more numerous than right most consistent with multifocal pneumonia. No definite pleural effusion is seen. Cardiomegaly is stable. Some retraction of the tracheal air shadow toward the right lung apex may indicate scarring secondary to prior disease. There are degenerative changes noted in the left shoulder. IMPRESSION: 1. Worsening of diffuse lung opacities most consistent with progression multifocal pneumonia. Recommend continued followup. 2. Stable mild cardiomegaly. Electronically Signed   By: Dwyane Dee M.D.   On: 06/25/2017 10:29   Dg Chest Portable 1 View  Result Date: 06/27/2017 CLINICAL DATA:  Shortness of breath EXAM: PORTABLE CHEST 1 VIEW COMPARISON:  06/06/2017 FINDINGS: Check shadow is stable. Elevation of the right hemidiaphragm is again identified. Right upper lobe infiltrate is noted new from the prior exam. No acute bony abnormality is noted. IMPRESSION: Right upper lobe infiltrate Electronically Signed   By: Alcide Clever M.D.   On: 07/15/2017 13:58        Scheduled Meds: . aspirin  325 mg Oral Daily  . baclofen  10 mg Oral QHS  . bisoprolol  5 mg Oral Daily  . budesonide  0.5 mg Nebulization BID  . docusate sodium  100 mg Oral BID  . dorzolamide-timolol  1 drop Both Eyes BID  . enoxaparin (LOVENOX) injection  40 mg Subcutaneous Q24H  . famotidine  20 mg Oral QHS  . FLUoxetine  80 mg Oral Daily  . guaiFENesin  600 mg Oral BID  . guaiFENesin-dextromethorphan  10 mL Oral Q6H  . ipratropium-albuterol  3 mL Nebulization Q6H  . levothyroxine  100 mcg Oral QAC breakfast  . mouth rinse  15 mL Mouth Rinse BID  . multivitamin with minerals  1 tablet Oral Daily  . OLANZapine  5 mg Oral Daily  . oxybutynin  5 mg Oral BID  . pantoprazole  20 mg Oral QAC breakfast  .  polyethylene glycol  17 g Oral Daily  . saccharomyces boulardii  250 mg Oral BID  . senna-docusate  2 tablet Oral Daily   Continuous Infusions: . aztreonam 1 g (06/26/17 0835)  . clindamycin (CLEOCIN) IV Stopped (06/26/17 0834)  . vancomycin Stopped (06/25/17 1834)     LOS: 2 days    Time spent:35 mins. More than 50% of that time was spent in counseling and/or coordination of care.  Burnadette Pop, MD [facility] Pager [phone]  If 7PM-7AM, please contact night-coverage [email or web address] Password [facility] 06/26/2017, 9:48 AM

## 2017-06-26 NOTE — Progress Notes (Addendum)
Patient is adamantly refusing the HHFNC. Pt was placed back on the 15LPM HFNC at this time. Pt's RN was notified of this at this time. The patient's o2 sats are 88-90%.

## 2017-06-26 NOTE — Progress Notes (Signed)
Pt having what appears to be paroxymal afib with RVR; short bursts of a high (120's), irregular rate. An EKG was obtained which showed a marked sinus arrythmia. The pt also has an O2 saturation in the 80's on 15 L NRB.   On call MD Opyd was notified, an order for heated high flow oxygen has been placed.  Will continue to monitor the pt.

## 2017-06-26 NOTE — Progress Notes (Signed)
  Speech Language Pathology Treatment: Dysphagia  Patient Details Name: ANEA FODERA MRN: [id number] DOB: 04-Aug-1927 Today's Date: 06/26/2017 Time: 4098-1191 SLP Time Calculation (min) (ACUTE ONLY): 8 min  Assessment / Plan / Recommendation Clinical Impression  Pt consumed limited amounts of thin liquids due to reported nausea (RN made aware). No overt s/s of aspiration were observed, although she continues to have high oxygen needs and CXR from previous date shows worsening PNA. Recommend continued use of aspiration precautions, taking breaks PRN for periods of tachypnea. SLP will continue to follow briefly.   HPI HPI: Pt is a 84 y.o. female with medical history significant of paroxysmal A. fib not on anticoagulation due to recurrent falls, chronic diastolic CHF, chronic kidney disease stage III, currently essentially bedbound, hypertension, hyperlipidemia, hypothyroidism, who is being brought to the hospital from her nursing home due to progressive weakness and shortness of breath. CXR wtih RUL infiltrate concerning for PNA.      SLP Plan  Continue with current plan of care       Recommendations  Diet recommendations: Dysphagia 3 (mechanical soft);Thin liquid Liquids provided via: Cup;Straw Medication Administration: Whole meds with liquid Supervision: Intermittent supervision to cue for compensatory strategies;Staff to assist with self feeding Compensations: Slow rate;Small sips/bites Postural Changes and/or Swallow Maneuvers: Seated upright 90 degrees                Oral Care Recommendations: Oral care BID Follow up Recommendations: Skilled Nursing facility SLP Visit Diagnosis: Dysphagia, unspecified (R13.10) Plan: Continue with current plan of care       GO                Maxcine Ham 06/26/2017, 11:09 AM  Maxcine Ham, M.A. CCC-SLP [phone]

## 2017-06-27 ENCOUNTER — Inpatient Hospital Stay (HOSPITAL_COMMUNITY): Payer: Medicare Other

## 2017-06-27 DIAGNOSIS — I5032 Chronic diastolic (congestive) heart failure: Secondary | ICD-10-CM

## 2017-06-27 DIAGNOSIS — Z8249 Family history of ischemic heart disease and other diseases of the circulatory system: Secondary | ICD-10-CM

## 2017-06-27 DIAGNOSIS — N183 Chronic kidney disease, stage 3 (moderate): Secondary | ICD-10-CM

## 2017-06-27 DIAGNOSIS — I480 Paroxysmal atrial fibrillation: Secondary | ICD-10-CM

## 2017-06-27 DIAGNOSIS — J181 Lobar pneumonia, unspecified organism: Secondary | ICD-10-CM

## 2017-06-27 DIAGNOSIS — A419 Sepsis, unspecified organism: Secondary | ICD-10-CM

## 2017-06-27 DIAGNOSIS — Z7401 Bed confinement status: Secondary | ICD-10-CM

## 2017-06-27 DIAGNOSIS — R0682 Tachypnea, not elsewhere classified: Secondary | ICD-10-CM

## 2017-06-27 DIAGNOSIS — R197 Diarrhea, unspecified: Secondary | ICD-10-CM

## 2017-06-27 DIAGNOSIS — Z880 Allergy status to penicillin: Secondary | ICD-10-CM

## 2017-06-27 DIAGNOSIS — Z9181 History of falling: Secondary | ICD-10-CM

## 2017-06-27 DIAGNOSIS — E039 Hypothyroidism, unspecified: Secondary | ICD-10-CM

## 2017-06-27 DIAGNOSIS — Z66 Do not resuscitate: Secondary | ICD-10-CM

## 2017-06-27 DIAGNOSIS — R1310 Dysphagia, unspecified: Secondary | ICD-10-CM

## 2017-06-27 DIAGNOSIS — J9601 Acute respiratory failure with hypoxia: Secondary | ICD-10-CM

## 2017-06-27 DIAGNOSIS — I130 Hypertensive heart and chronic kidney disease with heart failure and stage 1 through stage 4 chronic kidney disease, or unspecified chronic kidney disease: Secondary | ICD-10-CM

## 2017-06-27 LAB — CBC WITH DIFFERENTIAL/PLATELET
BASOS PCT: 0 %
Basophils Absolute: 0 10*3/uL (ref 0.0–0.1)
EOS PCT: 1 %
Eosinophils Absolute: 0.2 10*3/uL (ref 0.0–0.7)
HEMATOCRIT: 34.1 % — AB (ref 36.0–46.0)
HEMOGLOBIN: 10.7 g/dL — AB (ref 12.0–15.0)
LYMPHS ABS: 2.6 10*3/uL (ref 0.7–4.0)
Lymphocytes Relative: 12 %
MCH: 30 pg (ref 26.0–34.0)
MCHC: 31.4 g/dL (ref 30.0–36.0)
MCV: 95.5 fL (ref 78.0–100.0)
MONOS PCT: 10 %
Monocytes Absolute: 2.2 10*3/uL — ABNORMAL HIGH (ref 0.1–1.0)
NEUTROS PCT: 77 %
Neutro Abs: 16.8 10*3/uL — ABNORMAL HIGH (ref 1.7–7.7)
Platelets: 512 10*3/uL — ABNORMAL HIGH (ref 150–400)
RBC: 3.57 MIL/uL — AB (ref 3.87–5.11)
RDW: 16.5 % — ABNORMAL HIGH (ref 11.5–15.5)
WBC: 21.8 10*3/uL — AB (ref 4.0–10.5)

## 2017-06-27 LAB — RESPIRATORY PANEL BY PCR
Adenovirus: NOT DETECTED
Bordetella pertussis: NOT DETECTED
CORONAVIRUS 229E-RVPPCR: NOT DETECTED
CORONAVIRUS HKU1-RVPPCR: NOT DETECTED
CORONAVIRUS OC43-RVPPCR: NOT DETECTED
Chlamydophila pneumoniae: NOT DETECTED
Coronavirus NL63: NOT DETECTED
Influenza A: NOT DETECTED
Influenza B: NOT DETECTED
MYCOPLASMA PNEUMONIAE-RVPPCR: NOT DETECTED
Metapneumovirus: NOT DETECTED
PARAINFLUENZA VIRUS 1-RVPPCR: NOT DETECTED
Parainfluenza Virus 2: NOT DETECTED
Parainfluenza Virus 3: NOT DETECTED
Parainfluenza Virus 4: NOT DETECTED
RESPIRATORY SYNCYTIAL VIRUS-RVPPCR: NOT DETECTED
Rhinovirus / Enterovirus: NOT DETECTED

## 2017-06-27 LAB — URINALYSIS, ROUTINE W REFLEX MICROSCOPIC
GLUCOSE, UA: NEGATIVE mg/dL
HGB URINE DIPSTICK: NEGATIVE
KETONES UR: NEGATIVE mg/dL
NITRITE: NEGATIVE
PH: 5 (ref 5.0–8.0)
Protein, ur: 100 mg/dL — AB
Specific Gravity, Urine: 1.031 — ABNORMAL HIGH (ref 1.005–1.030)

## 2017-06-27 MED ORDER — SODIUM CHLORIDE 0.9 % IV SOLN
500.0000 mg | INTRAVENOUS | Status: DC
  Filled 2017-06-27: qty 500

## 2017-06-27 MED ORDER — SODIUM CHLORIDE 0.9 % IV SOLN
1.0000 g | Freq: Three times a day (TID) | INTRAVENOUS | Status: DC
  Administered 2017-06-27 – 2017-06-28 (×3): 1 g via INTRAVENOUS
  Filled 2017-06-27 (×5): qty 1

## 2017-06-27 MED ORDER — ACETAMINOPHEN 325 MG PO TABS
650.0000 mg | ORAL_TABLET | Freq: Four times a day (QID) | ORAL | Status: DC | PRN
  Administered 2017-06-27: 650 mg via ORAL
  Filled 2017-06-27: qty 2

## 2017-06-27 NOTE — Consult Note (Addendum)
[facility]    Date of Admission:  06/25/2017   Total days of antibiotics 4        Vancomycin 5/6-5/7        Clindamycin 5/6-5/7        Aztreonam 5/6-5/8        Azithromycin 5/9>         Flagyl 5/8>         Cefepime 5/8>         Reason for Consult: Pneumonia     Referring Provider: Dr. Renford Dills  Primary Care Provider: NO PCP   Assessment:  Debra Johnston is a 84 y.o. female with PMH of PAF not on anticoagulation due to recurrent falls, HFpEF, CKD 3, HTN, and hypothyroidism who presented to the ED on 5/6 due to generalized weakness and dyspnea and found to be septic 2/2 RUL pneumonia (HCAP vs aspiration). Repeat CXR 5/7 with multifocal pneumonia and patient switched to Azithromycin, Cefepime, and Flagyl. However, respiratory status has failed to improve and she continues to be hypoxic despite increasing O2 rate on HFNC.   Bcx 5/6 negative at 2 days  Bcx 5/9 pending  Ucx negative   Multifocal Pneumonia, HCAP   Plan: 1. Stop azithromycin, cefepime, and flagyl  2. Start meropenem  3. DNR/DNI per chart, recommend addressing GOCs to confirm as patient respiratory status continues to decline and may require intubation if she wishes to be intubated   Active Problems:   Chronic diastolic CHF (congestive heart failure) (HCC)   PAF (paroxysmal atrial fibrillation) (HCC)   Hypothyroidism   CKD (chronic kidney disease) stage 3, GFR 30-59 ml/min (HCC)   Protein-calorie malnutrition (HCC)   Hyperlipidemia   Obesity   GAD (generalized anxiety disorder)   Anemia of chronic disease   HCAP (healthcare-associated pneumonia)   Hypoxemia requiring supplemental oxygen   Upper airway cough syndrome   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 aspirin  325 mg Oral Daily  . baclofen  10 mg Oral QHS  . bisoprolol  5 mg Oral Daily  . budesonide  0.5 mg Nebulization BID  . docusate sodium  100 mg Oral BID  .  dorzolamide-timolol  1 drop Both Eyes BID  . enoxaparin (LOVENOX) injection  40 mg Subcutaneous Q24H  . famotidine  20 mg Oral QHS  . FLUoxetine  80 mg Oral Daily  . guaiFENesin  600 mg Oral BID  . guaiFENesin-dextromethorphan  10 mL Oral Q6H  . ipratropium-albuterol  3 mL Nebulization Q6H  . levothyroxine  100 mcg Oral QAC breakfast  . mouth rinse  15 mL Mouth Rinse BID  . multivitamin with minerals  1 tablet Oral Daily  . OLANZapine  5 mg Oral Daily  . oxybutynin  5 mg Oral BID  . pantoprazole  20 mg Oral QAC breakfast  . polyethylene glycol  17 g Oral Daily  . saccharomyces boulardii  250 mg Oral BID  . senna-docusate  2 tablet Oral Daily   Continuous Infusions: . azithromycin    . ceFEPime (MAXIPIME) IV Stopped (06/26/17 2248)  . metronidazole Stopped (06/27/17 0429)   PRN Meds:.acetaminophen, albuterol, baclofen, bisacodyl, HYDROcodone-acetaminophen, LORazepam, ondansetron (ZOFRAN) IV  HPI: Debra Johnston is a 84 y.o. female with PMH of PAF not on anticoagulation due to recurrent falls, HFpEF, CKD 3, HTN, and hypothyroidism who presented to the ED on 5/6 due to generalized weakness and dyspnea and  found to be septic 2/2 RUL pneumonia (HCAP vs aspiration).  She was admitted to SDU and started on vancomycin and aztreonam. She is now on azithromycin, cefepime and flagyl. She is currently on HFNC and remains hypoxic; she is not oxygen dependent at baseline. Her leukocytosis continues to worsen as well. She was admitted on 02/2017 for HCAP that was treated with vancomycin and aztreonam followed by a 7 day course of doxycycline after discharge.    When seen this AM she states she continues to feel weak and her breathing is worse. She is unable to speak in full sentences and is hypoxic on 30 HFNC. She denies fever, chills, chest pain, and abdominal pain. She does report on episode of diarrhea. She lives in a nursing facility and is essentially bedbound. States she has been eating regular food  at the SNF though not much due to poor appetite. SLP evaluated and now on dysphagia 3 diet though no overt signs or symptoms of aspiration were noted. Repeat CXR on 5/7 with multifocal PNA.    Review of Systems: Review of Systems  Constitutional: Positive for malaise/fatigue. Negative for chills and fever.  Respiratory: Positive for cough and shortness of breath. Negative for sputum production.   Cardiovascular: Negative for chest pain, palpitations and leg swelling.  Gastrointestinal: Positive for diarrhea. Negative for abdominal pain and nausea.  Genitourinary: Negative for dysuria.  Musculoskeletal: Negative for myalgias.  Neurological: Negative for dizziness and headaches.    Past Medical History:  Diagnosis Date  . Anxiety   . Bronchitis 02/22/2011  . CHF (congestive heart failure) (HCC)    felt to have diastolic dysfunction with normal EF at 60%  . Chronic pain   . CKD (chronic kidney disease) stage 4, GFR 15-29 ml/min (HCC)   . Depression   . DOE (dyspnea on exertion)    chronic   . Falls   . Fibromyalgia    with chronic weakness  . GERD (gastroesophageal reflux disease)   . Glaucoma   . Hyperlipidemia   . Hypertension   . Hypothyroidism   . Osteoarthritis   . PAF (paroxysmal atrial fibrillation) (HCC)    not felt to be a candidate for coumadin  . Physical deconditioning   . Pneumonia 02/2017    Social History   Tobacco Use  . Smoking status: Never Smoker  . Smokeless tobacco: Never Used  Substance Use Topics  . Alcohol use: No  . Drug use: No    Family History  Problem Relation Age of Onset  . Heart failure Mother   . Heart attack Mother   . Stroke Father   . Lymphoma Brother   . Cancer Brother    Allergies  Allergen Reactions  . Penicillins Swelling and Rash    Per med list, has tolerated ceftriaxone in the past  Has patient had a PCN reaction causing immediate rash, facial/tongue/throat swelling, SOB or lightheadedness with hypotension:  yes Has patient had a PCN reaction causing severe rash involving mucus membranes or skin necrosis: unk Has patient had a PCN reaction that required hospitalization: unk Has patient had a PCN reaction occurring within the last 10 years: unk If all of the above answers are "NO", then may proceed with Cephalosporin use.     OBJECTIVE: Blood pressure 126/63, pulse 75, temperature (!) 101.1 F (38.4 C), temperature source Axillary, resp. rate (!) 25, height  (1.778 m), weight 199 lb 11.8 oz (90.6 kg), SpO2 91 %.  Physical Exam  Constitutional: She is  oriented to person, place, and time.  Chronically-ill appearing female, bed bound, in mild distress   HENT:  Head: Normocephalic and atraumatic.  Dry mucus membranes  Neck: Normal range of motion. Neck supple.  Cardiovascular: Normal rate, regular rhythm and normal heart sounds. Exam reveals no gallop and no friction rub.  No murmur heard. Pulmonary/Chest:  Tachypneic while on HFNC and hypoxic on monitor (80s) which worsens when she speaks, unable to speak in full sentences, accessory muscle use noted, diffuse coarse breath sounds, no wheezes appreciated  Abdominal: Soft. Bowel sounds are normal. She exhibits no distension. There is no tenderness.  Musculoskeletal:       Right lower leg: She exhibits no edema.       Left lower leg: She exhibits no edema.  Lymphadenopathy:    She has no cervical adenopathy.  Neurological: She is alert and oriented to person, place, and time.  Skin: Capillary refill takes less than 2 seconds.    Lab Results Lab Results  Component Value Date   WBC 21.8 (H) 06/27/2017   HGB 10.7 (L) 06/27/2017   HCT 34.1 (L) 06/27/2017   MCV 95.5 06/27/2017   PLT 512 (H) 06/27/2017    Lab Results  Component Value Date   CREATININE 0.96 06/26/2017   BUN 13 06/26/2017   NA 141 06/26/2017   K 3.5 06/26/2017   CL 108 06/26/2017   CO2 25 06/26/2017    Lab Results  Component Value Date   ALT 20 02/18/2017    AST 30 02/18/2017   ALKPHOS 73 02/18/2017   BILITOT 0.6 02/18/2017     Microbiology: Recent Results (from the past 240 hour(s))  Culture, blood (routine x 2)     Status: None (Preliminary result)   Collection Time: 06/23/2017  2:03 PM  Result Value Ref Range Status   Specimen Description BLOOD RIGHT FOREARM  Final   Special Requests   Final    BOTTLES DRAWN AEROBIC AND ANAEROBIC Blood Culture adequate volume   Culture   Final    NO GROWTH 2 DAYS Performed at [facility], [address], [place], [place] [address]    Report Status PENDING  Incomplete  Culture, blood (routine x 2)     Status: None (Preliminary result)   Collection Time: 06/23/2017  2:55 PM  Result Value Ref Range Status   Specimen Description BLOOD LEFT HAND  Final   Special Requests   Final    BOTTLES DRAWN AEROBIC AND ANAEROBIC Blood Culture adequate volume   Culture   Final    NO GROWTH 2 DAYS Performed at [facility], [address], [place], [place] [address]    Report Status PENDING  Incomplete  Urine culture     Status: None   Collection Time: 07/03/2017  6:05 PM  Result Value Ref Range Status   Specimen Description URINE, RANDOM  Final   Special Requests NONE  Final   Culture   Final    NO GROWTH Performed at [facility], [address] [place], [place] [address]    Report Status 06/25/2017 FINAL  Final  MRSA PCR Screening     Status: None   Collection Time: 06/20/2017 11:00 PM  Result Value Ref Range Status   MRSA by PCR NEGATIVE NEGATIVE Final    Comment:        The GeneXpert MRSA Assay (FDA approved for NASAL specimens only), is one component of a comprehensive MRSA colonization surveillance program. It is  not intended to diagnose MRSA infection nor to guide or monitor treatment for MRSA infections. Performed at [facility], [address], [place], [place] [address]     Burna Cash, MD [facility] [phone] pager   [phone] cell 06/27/2017, 9:45 AM

## 2017-06-27 NOTE — Progress Notes (Signed)
 PROGRESS NOTE    Debra Johnston  AVW:[id number] DOB: 02/27/1927 DOA: 06-28-2017 PCP: Patient, No Pcp Per   Brief Narrative: Patient is a 84 year old female with past medical history of proximal A. fib not on anticoagulation, chronic diastolic CHF, chronic kidney disease stage III, hypertension, osteoarthritis, hyperlipidemia, hypothyroidism who  sent from nursing home due to progressive weakness and shortness of breath.  Patient was suspected to have healthcare associated pneumonia versus aspiration pneumonia and was started on broad-spectrum antibiotics, high flow oxygen.   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 MRSA PCR negative. Continues to require high flow oxygen We will follow-up blood cultures, sputum culture. NGTD. Lactic acidosis resolved. Speech therapy following.  Recommended dysphagia 3 diet.  Leukocytosis worsened this morning.  Patient also febrile.Repeat blood cultures sent.We will follow up respiratory virus panel.  Acute on chronic hypoxic respiratory failure: Uses oxygen at nursing home.  Last CXR showed slight worsening of her multifocal pneumonia.We have ordered CT chest today.  History of Afib :patient's CHA2DS2-VASc Score for  Stroke Risk is> 2,however not a candidate for anticoagulation per previous assessments due to recurrent falls. Continue full dose aspirin.  Hypertension: Currently on bisoprolol.  Blood pressure this morning stable.  History of chronic diastolic CHF: Currently euvolemic.    CKD stage III: Kidney function on baseline.  We will continue to monitor..  Anemia of chronic disease: Currently H and H is  Stable.  Hypothyroidism: TSH stable.  Continue Synthyroid  Severe deconditioning/functional paraplegia: History of severe osteoarthritis, back surgery.  She is chronically bedbound.  PT/OT consulted.  Recommended to go back to SNF when ready.  Multiple comorbidities/poor prognosis: I discussed with her son today on phone and we decided to consult palliative care for discussion on goals of care. Son interested  on discussion about hospice care if she does not improve.    DVT prophylaxis: Lovenox Code Status: DNR Family Communication: Discussed with son on the phone . disposition Plan: SNF when ready vs Hospice    Consultants: None  Procedures: None  Antimicrobials: Meropenem since 5/9  Subjective: Patient seen and examined at bedside this morning.  Appears  weak today.  Also found to be febrile this morning.  Continues to require high flow oxygen.  Objective: Vitals:   06/27/17 0826 06/27/17 0833 06/27/17 0921 06/27/17 1200  BP:  126/63  (!) 117/43  Pulse:  75  67  Resp:  (!) 25  (!) 32  Temp:  (!) 101.1 F (38.4 C)  98.4 F (36.9 C)  TempSrc:  Axillary  Oral  SpO2: 91% 95% 91% (!) 88%  Weight:      Height:        Intake/Output Summary (Last 24 hours) at 06/27/2017 1314 Last data filed at 06/26/2017 1700 Gross per 24 hour  Intake 200 ml  Output -  Net 200 ml   Filed Weights   06/23/2017 2149  Weight: 90.6 kg (199 lb 11.8 oz)    Examination:  General exam: Appears calm and comfortable , chronically ill, weak HEENT:PERRL,Oral mucosa moist, Ear/Nose normal on gross  exam Respiratory system: Bilateral decreased air entry, bilateral rhonchi, coarse breathing sounds, basal crackles Cardiovascular system: S1 & S2 heard, RRR. No JVD, murmurs, rubs, gallops or clicks. Gastrointestinal system: Abdomen is nondistended, soft and nontender. No organomegaly or masses felt. Normal bowel sounds heard. Central nervous system: Alert and oriented. Extremities: No edema, no clubbing ,no cyanosis, distal peripheral pulses palpable. Skin: No rashes, lesions or ulcers,no icterus ,no pallor  Data Reviewed: I have personally reviewed following labs and imaging studies  CBC: Recent Labs  Lab  1336 06/25/17 0557 06/26/17 0233 06/27/17 0206  WBC 17.5* 17.4* 19.7* 21.8*  NEUTROABS 13.6*  --   --  16.8*  HGB 11.7* 11.2* 10.9* 10.7*  HCT 36.7 36.2 35.3* 34.1*  MCV 92.9 94.5 94.9 95.5  PLT 511* 490* 482* 512*   Basic Metabolic Panel: Recent Labs  Lab 07/19/2017 1336 06/25/17 0557 06/26/17 0233  NA 140 142 141  K 4.3 3.8 3.5  CL 106 111 108  CO2 GLUCOSE 130* 119* 153*  BUN CREATININE 1.07* 0.95 0.96  CALCIUM 9.5 9.0 9.1   GFR: Estimated Creatinine Clearance: 50.4 mL/min (by C-G formula based on SCr of 0.96 mg/dL). Liver Function Tests: No results for input(s): AST, ALT, ALKPHOS, BILITOT, PROT, ALBUMIN in the last 168 hours. No results for input(s): LIPASE, AMYLASE in the last 168 hours. No results for input(s): AMMONIA in the last 168 hours. Coagulation Profile: No results for input(s): INR, PROTIME in the last 168 hours. Cardiac Enzymes: No results for input(s): CKTOTAL, CKMB, CKMBINDEX, TROPONINI in the last 168 hours. BNP (last 3 results) Recent Labs    06/06/17 1441  PROBNP 45.0   HbA1C: No results for input(s): HGBA1C in the last 72 hours. CBG: No results for input(s): GLUCAP in the last 168 hours. Lipid Profile: No results for input(s): CHOL, HDL, LDLCALC, TRIG, CHOLHDL, LDLDIRECT in the last 72 hours. Thyroid  Function Tests: Recent Labs    06/25/17 0557  TSH 2.038   Anemia Panel: No results for input(s): VITAMINB12, FOLATE, FERRITIN, TIBC, IRON, RETICCTPCT in the last 72 hours. Sepsis Labs: Recent Labs  Lab 07/04/2017 1558 06/30/2017 1801  LATICACIDVEN 2.28* 1.17    Recent Results (from the past 240 hour(s))  Culture, blood (routine x 2)     Status: None (Preliminary result)   Collection Time: 07/19/2017  2:03 PM  Result Value Ref Range Status   Specimen Description BLOOD RIGHT FOREARM  Final   Special Requests   Final    BOTTLES DRAWN AEROBIC AND ANAEROBIC Blood Culture adequate volume   Culture   Final    NO GROWTH 3 DAYS Performed at [facility], [address] [place], [place] [address]    Report Status PENDING  Incomplete  Culture, blood (routine x 2)     Status: None (Preliminary result)   Collection Time: 06/23/2017  2:55 PM  Result Value Ref Range Status   Specimen Description BLOOD LEFT HAND  Final   Special Requests   Final    BOTTLES DRAWN AEROBIC AND ANAEROBIC Blood Culture adequate volume   Culture   Final    NO GROWTH 3 DAYS Performed at [facility], [address], [place], [place] [address]    Report Status PENDING  Incomplete  Urine culture     Status: None   Collection Time: 07-20-17  6:05 PM  Result Value Ref Range Status   Specimen Description URINE, RANDOM  Final   Special Requests NONE  Final   Culture   Final    NO GROWTH Performed at [facility], [address] [place], [place] [address]    Report Status 06/25/2017 FINAL  Final  MRSA PCR Screening     Status: None   Collection Time: 07-20-17 11:00 PM  Result Value Ref Range Status   MRSA by PCR NEGATIVE NEGATIVE Final    Comment:        The GeneXpert MRSA Assay (FDA approved for NASAL specimens only), is one component of a comprehensive MRSA colonization surveillance program. It is not intended to diagnose MRSA infection nor to guide or monitor treatment for MRSA  infections. Performed at [facility], [address] [place], [place] [address]          Radiology Studies: No results found.      Scheduled Meds: . aspirin  325 mg Oral Daily  . baclofen  10 mg Oral QHS  . bisoprolol  5 mg Oral Daily  . budesonide  0.5 mg Nebulization BID  . docusate sodium  100 mg Oral BID  . dorzolamide-timolol  1 drop Both Eyes BID  . enoxaparin (LOVENOX) injection  40 mg Subcutaneous Q24H  . famotidine  20 mg Oral QHS  . FLUoxetine  80 mg Oral Daily  . guaiFENesin  600 mg Oral BID  . guaiFENesin-dextromethorphan  10 mL Oral Q6H  . ipratropium-albuterol  3 mL Nebulization Q6H  . levothyroxine  100 mcg Oral QAC breakfast  . mouth rinse  15 mL Mouth Rinse BID  . multivitamin with minerals  1 tablet Oral Daily  . OLANZapine  5 mg Oral Daily  . oxybutynin  5 mg Oral BID  . pantoprazole  20 mg Oral QAC breakfast  . polyethylene glycol  17 g Oral Daily  . saccharomyces boulardii  250 mg Oral BID  . senna-docusate  2 tablet Oral Daily   Continuous Infusions: . meropenem (MERREM) IV       LOS: 3 days    Time spent:25 mins. More than 50% of that time was spent in counseling and/or coordination of care.      Burnadette Pop, MD [facility] Pager [phone]  If 7PM-7AM, please contact night-coverage [email or web address] Password [email or web address] 06/27/2017, 1:14 PM

## 2017-06-27 NOTE — Progress Notes (Signed)
Pharmacy Antibiotic Note  Debra Johnston is a 84 y.o. female admitted on 07/01/17 with pneumonia.  Pharmacy has been consulted for Merrem dosing.  Abx D#4 for PNA. Has swelling and rash to PCN. Per med report, has tolerated ceftriaxone recently. MRSA PCR negative. Afebrile, WBC 19.7, SCr 0.96 (stable), LA normalized, strep pneumo neg. Still not improving so switching abx regimen  Plan: Stop azithromycin, cefepime, and Flagyl Start meropenem 1g IV Q8h Monitor clinical picture, renal function F/U abx deescalation / LOT   Height:  (177.8 cm) Weight: 199 lb 11.8 oz (90.6 kg) IBW/kg (Calculated) : 68.5  Temp (24hrs), Avg:98.5 F (36.9 C), Min:97.2 F (36.2 C), Max:101.1 F (38.4 C)  Recent Labs  Lab 07-01-17 1336 2017-07-01 1558 Jul 01, 2017 1801 06/25/17 0557 06/26/17 0233 06/27/17 0206  WBC 17.5*  --   --  17.4* 19.7* 21.8*  CREATININE 1.07*  --   --  0.95 0.96  --   LATICACIDVEN  --  2.28* 1.17  --   --   --     Estimated Creatinine Clearance: 50.4 mL/min (by C-G formula based on SCr of 0.96 mg/dL).    Allergies  Allergen Reactions  . Penicillins Swelling and Rash    Per med list, has tolerated ceftriaxone in the past  Has patient had a PCN reaction causing immediate rash, facial/tongue/throat swelling, SOB or lightheadedness with hypotension: yes Has patient had a PCN reaction causing severe rash involving mucus membranes or skin necrosis: unk Has patient had a PCN reaction that required hospitalization: unk Has patient had a PCN reaction occurring within the last 10 years: unk If all of the above answers are "NO", then may proceed with Cephalosporin use.    Enzo Bi, PharmD, BCPS Clinical Pharmacist Pager [phone] 06/27/2017 11:03 AM

## 2017-06-27 NOTE — Progress Notes (Signed)
Pt placed on heated HFNC of 30L @ 100%. Due to desat to low 80's. Pt slowly resolved to 91% over a period of about 5 minutes. Pt tol well. Pt does not seem lethargic or in distress, able to answers questions appropriately. Will cont to monitor

## 2017-06-28 DIAGNOSIS — Z7189 Other specified counseling: Secondary | ICD-10-CM

## 2017-06-28 DIAGNOSIS — R0602 Shortness of breath: Secondary | ICD-10-CM

## 2017-06-28 DIAGNOSIS — Z515 Encounter for palliative care: Secondary | ICD-10-CM

## 2017-06-28 LAB — CBC WITH DIFFERENTIAL/PLATELET
BASOS PCT: 0 %
Basophils Absolute: 0 10*3/uL (ref 0.0–0.1)
EOS PCT: 6 %
Eosinophils Absolute: 1.4 10*3/uL — ABNORMAL HIGH (ref 0.0–0.7)
HEMATOCRIT: 36.2 % (ref 36.0–46.0)
Hemoglobin: 11 g/dL — ABNORMAL LOW (ref 12.0–15.0)
Lymphocytes Relative: 10 %
Lymphs Abs: 2.3 10*3/uL (ref 0.7–4.0)
MCH: 29.4 pg (ref 26.0–34.0)
MCHC: 30.4 g/dL (ref 30.0–36.0)
MCV: 96.8 fL (ref 78.0–100.0)
MONO ABS: 2.5 10*3/uL — AB (ref 0.1–1.0)
MONOS PCT: 11 %
NEUTROS PCT: 73 %
Neutro Abs: 16.7 10*3/uL — ABNORMAL HIGH (ref 1.7–7.7)
Platelets: 506 10*3/uL — ABNORMAL HIGH (ref 150–400)
RBC: 3.74 MIL/uL — AB (ref 3.87–5.11)
RDW: 16.8 % — AB (ref 11.5–15.5)
WBC: 22.9 10*3/uL — ABNORMAL HIGH (ref 4.0–10.5)

## 2017-06-28 MED ORDER — MORPHINE SULFATE (PF) 2 MG/ML IV SOLN
2.0000 mg | INTRAVENOUS | Status: DC | PRN
  Administered 2017-06-28 – 2017-06-29 (×8): 2 mg via INTRAVENOUS
  Filled 2017-06-28 (×8): qty 1

## 2017-06-28 MED ORDER — LORAZEPAM 2 MG/ML IJ SOLN
1.0000 mg | INTRAMUSCULAR | Status: DC | PRN
  Administered 2017-06-29: 1 mg via INTRAVENOUS
  Filled 2017-06-28: qty 1

## 2017-06-28 MED ORDER — SENNOSIDES-DOCUSATE SODIUM 8.6-50 MG PO TABS: 2.0000 | ORAL_TABLET | Freq: Every evening | ORAL | Status: DC | PRN

## 2017-06-28 MED ORDER — LORAZEPAM 1 MG PO TABS: 1.0000 mg | ORAL_TABLET | ORAL | Status: DC | PRN

## 2017-06-28 MED ORDER — MORPHINE SULFATE (PF) 2 MG/ML IV SOLN
2.0000 mg | INTRAVENOUS | Status: AC
  Administered 2017-06-28: 2 mg via INTRAVENOUS
  Filled 2017-06-28: qty 1

## 2017-06-28 MED ORDER — HALOPERIDOL LACTATE 2 MG/ML PO CONC
0.5000 mg | ORAL | Status: DC | PRN
  Filled 2017-06-28: qty 0.3

## 2017-06-28 MED ORDER — HALOPERIDOL LACTATE 5 MG/ML IJ SOLN
0.5000 mg | INTRAMUSCULAR | Status: DC | PRN
  Administered 2017-06-30: 0.5 mg via INTRAVENOUS
  Filled 2017-06-28: qty 1

## 2017-06-28 MED ORDER — LORAZEPAM 2 MG/ML PO CONC: 1.0000 mg | ORAL | Status: DC | PRN

## 2017-06-28 MED ORDER — IPRATROPIUM-ALBUTEROL 0.5-2.5 (3) MG/3ML IN SOLN
3.0000 mL | Freq: Three times a day (TID) | RESPIRATORY_TRACT | Status: DC
  Administered 2017-06-28: 3 mL via RESPIRATORY_TRACT
  Filled 2017-06-28: qty 3

## 2017-06-28 MED ORDER — HALOPERIDOL 0.5 MG PO TABS: 0.5000 mg | ORAL_TABLET | ORAL | Status: DC | PRN

## 2017-06-28 MED ORDER — MORPHINE SULFATE (PF) 2 MG/ML IV SOLN: 1.0000 mg | INTRAVENOUS | Status: DC

## 2017-06-28 MED ORDER — BIOTENE DRY MOUTH MT LIQD: 15.0000 mL | OROMUCOSAL | Status: DC | PRN

## 2017-06-28 NOTE — Progress Notes (Signed)
Pt placed on BIPAP per  MD order due to sats dropiing into 8-'s and sustaining even on HFNC pf 40L and 100%. Pt tol BIPAP well. Sats up to 94-96%. Will cont to monitor

## 2017-06-28 NOTE — Consult Note (Signed)
Consultation Note Date: 06/28/2017   Patient Name: Debra Johnston  DOB: 03-24-1927  MRN: [id number]  Age / Sex: 84 y.o., female  PCP: Patient, No Pcp Per Referring Physician: Shelly Coss, MD  Reason for Consultation: Establishing goals of care, Hospice Evaluation and Psychosocial/spiritual support  HPI/Patient Profile: 84 y.o. female  with past medical history of a fib not on anticoagulation d/t recurrent falls, CHF, CKD 3, HTN, OA, HLD, hypothyroidism, and essentially bedbound admitted on 06/23/2017 with weakness and shortness of breath. Patients lives at [facility].  On admission, she was diagnosed with multifocal pna. Her respiratory status and CXR have worsened and she is febrile.   PMT consulted for [place], end of life, hospice evaluation.   Clinical Assessment and Goals of Care: I have reviewed medical records including EPIC notes, labs and imaging, received report from Dr. Tawanna Solo and bedside RN, assessed the patient and then met at the bedside along with patient's son, Legrand Como and his wife and 2 children,  to discuss diagnosis prognosis, GOC, EOL wishes, disposition and options.  I introduced Palliative Medicine as specialized medical care for people living with serious illness. It focuses on providing relief from the symptoms and stress of a serious illness. The goal is to improve quality of life for both the patient and the family.  We discussed a brief life review of the patient. She was a housewife. She lost her husband 1 year ago and has been grieving his death since.   As far as functional status, she has been essentially bed bound for over a year. They tell me she has a poor quality of life.    We discussed their current illness and what it means in the larger context of their on-going co-morbidities.  Natural disease trajectory and expectations at EOL were discussed.  I attempted to elicit values and goals of care  important to the patient.  They tell me her quality of life is important to her.   The difference between aggressive medical intervention and comfort care was considered in light of the patient's goals of care. The family wants to focus on her comfort.   Questions and concerns were addressed.  The family was encouraged to call with questions or concerns.   Primary Decision Maker NEXT OF KIN, son, Legrand Como    SUMMARY OF RECOMMENDATIONS   - Full comfort care - remove bipap after administration of morphine, use opioids for respiratory distress - will monitor patient - she is too unstable to move right now, if stabilizes she may be appropriate for residential hospice but I anticipate a hospital death  Code Status/Advance Care Planning:  DNR   Symptom Management:   Haldol 0.5 mg prn agitation  Ativan 1 mg prn anxiety  Morphine 2 mg prn dyspnea or pain  Palliative Prophylaxis:   Aspiration, Bowel Regimen, Delirium Protocol, Frequent Pain Assessment, Oral Care and Turn Reposition  Additional Recommendations (Limitations, Scope, Preferences):  Full Comfort Care  Psycho-social/Spiritual:   Desire for further Chaplaincy support:yes  Additional Recommendations: Education on Hospice  Prognosis:   Hours - Days  Discharge Planning: Anticipated Hospital Death vs residential hospice     Primary Diagnoses: Present on Admission: . HCAP (healthcare-associated pneumonia) . Anemia of chronic disease . Chronic diastolic CHF (congestive heart failure) ([place]) . CKD (chronic kidney disease) stage 3, GFR 30-59 ml/min (HCC) . Essential hypertension . GAD (generalized anxiety disorder) . Hyperlipidemia . Hypothyroidism . Obesity . PAF (paroxysmal atrial fibrillation) ([place]) . Protein-calorie malnutrition ([place]) .  Upper airway cough syndrome   I have reviewed the medical record, interviewed the patient and family, and examined the patient. The following aspects are pertinent.  Past  Medical History:  Diagnosis Date  . Anxiety   . Bronchitis 02/22/2011  . CHF (congestive heart failure) ([place])    felt to have diastolic dysfunction with normal EF at 60%  . Chronic pain   . CKD (chronic kidney disease) stage 4, GFR 15-29 ml/min (HCC)   . Depression   . DOE (dyspnea on exertion)    chronic   . Falls   . Fibromyalgia    with chronic weakness  . GERD (gastroesophageal reflux disease)   . Glaucoma   . Hyperlipidemia   . Hypertension   . Hypothyroidism   . Osteoarthritis   . PAF (paroxysmal atrial fibrillation) ([place])    not felt to be a candidate for coumadin  . Physical deconditioning   . Pneumonia 02/2017   Social History   Socioeconomic History  . Marital status: Married    Spouse name: Not on file  . Number of children: Not on file  . Years of education: Not on file  . Highest education level: Not on file  Occupational History  . Not on file  Social Needs  . Financial resource strain: Not on file  . Food insecurity:    Worry: Not on file    Inability: Not on file  . Transportation needs:    Medical: Not on file    Non-medical: Not on file  Tobacco Use  . Smoking status: Never Smoker  . Smokeless tobacco: Never Used  Substance and Sexual Activity  . Alcohol use: No  . Drug use: No  . Sexual activity: Not Currently    Birth control/protection: Post-menopausal  Lifestyle  . Physical activity:    Days per week: Not on file    Minutes per session: Not on file  . Stress: Not on file  Relationships  . Social connections:    Talks on phone: Not on file    Gets together: Not on file    Attends religious service: Not on file    Active member of club or organization: Not on file    Attends meetings of clubs or organizations: Not on file    Relationship status: Not on file  Other Topics Concern  . Not on file  Social History Narrative  . Not on file   Family History  Problem Relation Age of Onset  . Heart failure Mother   . Heart attack  Mother   . Stroke Father   . Lymphoma Brother   . Cancer Brother    Scheduled Meds: . aspirin  325 mg Oral Daily  . baclofen  10 mg Oral QHS  . bisoprolol  5 mg Oral Daily  . budesonide  0.5 mg Nebulization BID  . docusate sodium  100 mg Oral BID  . dorzolamide-timolol  1 drop Both Eyes BID  . enoxaparin (LOVENOX) injection  40 mg Subcutaneous Q24H  . famotidine  20 mg Oral QHS  . FLUoxetine  80 mg Oral Daily  . guaiFENesin  600 mg Oral BID  . guaiFENesin-dextromethorphan  10 mL Oral Q6H  . ipratropium-albuterol  3 mL Nebulization TID  . levothyroxine  100 mcg Oral QAC breakfast  . mouth rinse  15 mL Mouth Rinse BID  . multivitamin with minerals  1 tablet Oral Daily  . OLANZapine  5 mg Oral Daily  . oxybutynin  5 mg  Oral BID  . pantoprazole  20 mg Oral QAC breakfast  . polyethylene glycol  17 g Oral Daily  . saccharomyces boulardii  250 mg Oral BID  . senna-docusate  2 tablet Oral Daily   Continuous Infusions: . meropenem (MERREM) IV Stopped (06/28/17 0349)   PRN Meds:.acetaminophen, albuterol, baclofen, bisacodyl, HYDROcodone-acetaminophen, LORazepam, ondansetron (ZOFRAN) IV Allergies  Allergen Reactions  . Penicillins Swelling and Rash    Per med list, has tolerated ceftriaxone in the past  Has patient had a PCN reaction causing immediate rash, facial/tongue/throat swelling, SOB or lightheadedness with hypotension: yes Has patient had a PCN reaction causing severe rash involving mucus membranes or skin necrosis: unk Has patient had a PCN reaction that required hospitalization: unk Has patient had a PCN reaction occurring within the last 10 years: unk If all of the above answers are "NO", then may proceed with Cephalosporin use.    Review of Systems  Unable to perform ROS: Acuity of condition    Physical Exam  Constitutional: She appears lethargic. She appears distressed.  HENT:  Head: Normocephalic and atraumatic.  Cardiovascular: Normal rate and regular rhythm.    Pulmonary/Chest: Accessory muscle usage present. Tachypnea noted. She is in respiratory distress. She has decreased breath sounds.  Abdominal: Soft.  Neurological: She appears lethargic. She is disoriented.  Skin: Skin is warm and dry.  Psychiatric: Cognition and memory are impaired.    Vital Signs: BP (!) 128/46 (BP Location: Left Arm)   Pulse 63   Temp 99.4 F (37.4 C) (Axillary)   Resp 16   Ht 5' 10"  (1.778 m)   Wt 90.6 kg (199 lb 11.8 oz)   SpO2 91%   BMI 28.66 kg/m  Pain Scale: 0-10   Pain Score: Asleep   SpO2: SpO2: 91 % O2 Device:SpO2: 91 % O2 Flow Rate: .O2 Flow Rate (L/min): 30 L/min  IO: Intake/output summary:   Intake/Output Summary (Last 24 hours) at 06/28/2017 0739 Last data filed at 06/27/2017 1553 Gross per 24 hour  Intake 100 ml  Output -  Net 100 ml    LBM: Last BM Date: 06/26/17 Baseline Weight: Weight: 90.6 kg (199 lb 11.8 oz) Most recent weight: Weight: 90.6 kg (199 lb 11.8 oz)     Palliative Assessment/Data: PPS 20%     Time In: 0900 Time Out: 1000 Time Total: 60 minutes Greater than 50%  of this time was spent counseling and coordinating care related to the above assessment and plan.  Juel Burrow, DNP, AGNP-C Palliative Medicine Team [phone]

## 2017-06-28 NOTE — Progress Notes (Deleted)
Pt asked to go off the unit to get something from the vending machine. RN advised that she could walk around the unit, and could get something from the vending machine but needed to come right back. Pt was informed that if she did not come back we would have to send security to look for her.  Will continue to monitor.  Caswell Corwin, RN 06/28/17 9:05 PM

## 2017-06-28 NOTE — Progress Notes (Signed)
PROGRESS NOTE    Debra Johnston  ZOX:[id number] DOB: 05-Jan-1928 DOA: 07/11/2017 PCP: Patient, No Pcp Per   Brief Narrative: Patient is a 84 year old female with past medical history of proximal A. fib not on anticoagulation, chronic diastolic CHF, chronic kidney disease stage III, hypertension, osteoarthritis, hyperlipidemia, hypothyroidism who  sent from nursing home due to progressive weakness and shortness of breath.  Patient was suspected to have healthcare associated pneumonia versus aspiration pneumonia and was started on broad-spectrum antibiotics, high flow oxygen.  Currently on comfort care.Actively dying.  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  Acute on chronic hypoxic respiratory failure: Uses oxygen at nursing home. CT as above  History of Afib :patient's CHA2DS2-VASc Score for Stroke Risk is> 2,however not a candidate for anticoagulation per previous assessments due to recurrent falls.  Hypertension: Was on  bisoprolol.  Blood pressure this morning stable.  History of chronic diastolic CHF: Currently euvolemic.    CKD stage III: Kidney function on baseline.   Anemia of chronic disease:  Currently H and H is  Stable.  Hypothyroidism: TSH stable.   Severe deconditioning/functional paraplegia: History of severe osteoarthritis, back surgery.  She is chronically bedbound.   Multiple comorbidities/poor prognosis:Initiated full comfort care as per palliative care. Anticipate hospital death.If she improves,plan is for hospice.   DVT prophylaxis: None Code Status: Comfort care Family Communication: Discussed with son on the phone on 06/27/17 disposition Plan: Unknown   Consultants: None  Procedures: None  Antimicrobials: None  Subjective: Patient lethargic this morning. Initiated comfort care  Objective: Vitals:   06/28/17 0813 06/28/17 0815 06/28/17 0835 06/28/17 0838  BP:    (!) 102/27  Pulse:   64 65  Resp:   (!) 25 (!) 24  Temp:      TempSrc:      SpO2: (!) 85% (!) 86% 97% (!) 89%  Weight:      Height:        Intake/Output Summary (Last 24 hours) at 06/28/2017 1244 Last data filed at 06/27/2017 1553 Gross per 24 hour  Intake 100 ml  Output -  Net 100 ml   Filed Weights   06/29/2017 2149  Weight: 90.6 kg (199 lb 11.8 oz)    Examination:  General exam: terminally ill,actively dying Respiratory system: Bilateral decreased air entry, bilateral rhonchi, coarse breathing sounds, basal crackles Cardiovascular system: S1 & S2 heard, RRR. No JVD, murmurs, rubs, gallops or clicks. Gastrointestinal system: Abdomen is nondistended, soft and nontender. Central nervous system:Not  Alert and oriented. Extremities: No edema, no clubbing ,no cyanosis, distal peripheral pulses palpable. Skin: No rashes, lesions or ulcers,no icterus ,no pallor  Data Reviewed: I have personally reviewed following labs and imaging studies  CBC:  Recent Labs  Lab 07/06/2017 1336 06/25/17 0557 06/26/17 0233 06/27/17 0206 06/28/17 0335  WBC 17.5* 17.4* 19.7* 21.8* 22.9*  NEUTROABS 13.6*  --   --  16.8* 16.7*  HGB 11.7* 11.2* 10.9* 10.7* 11.0*  HCT 36.7 36.2 35.3* 34.1* 36.2  MCV  92.9 94.5 94.9 95.5 96.8  PLT 511* 490* 482* 512* 506*   Basic Metabolic Panel: Recent Labs  Lab 06/19/2017 1336 06/25/17 0557 06/26/17 0233  NA 140 142 141  K 4.3 3.8 3.5  CL 106 111 108  CO2 GLUCOSE 130* 119* 153*  BUN CREATININE 1.07* 0.95 0.96  CALCIUM 9.5 9.0 9.1   GFR: Estimated Creatinine Clearance: 50.4 mL/min (by C-G formula based on SCr of 0.96 mg/dL). Liver Function Tests: No results for input(s): AST, ALT, ALKPHOS, BILITOT, PROT, ALBUMIN in the last 168 hours. No results for input(s): LIPASE, AMYLASE in the last 168 hours. No results for input(s): AMMONIA in the last 168 hours. Coagulation Profile: No results for input(s): INR, PROTIME in the last 168 hours. Cardiac Enzymes: No results for input(s): CKTOTAL, CKMB, CKMBINDEX, TROPONINI in the last 168 hours. BNP (last 3 results) Recent Labs    06/06/17 1441  PROBNP 45.0   HbA1C: No results for input(s): HGBA1C in the last 72 hours. CBG: No results for input(s): GLUCAP in the last 168 hours. Lipid Profile: No results for input(s): CHOL, HDL, LDLCALC, TRIG, CHOLHDL, LDLDIRECT in the last 72 hours. Thyroid Function Tests: No results for input(s): TSH, T4TOTAL, FREET4, T3FREE, THYROIDAB in the last 72 hours. Anemia Panel: No results for input(s): VITAMINB12, FOLATE, FERRITIN, TIBC, IRON, RETICCTPCT in the last 72 hours. Sepsis Labs: Recent Labs  Lab 06/23/2017 1558 07/07/2017 1801  LATICACIDVEN 2.28* 1.17    Recent Results (from the past 240 hour(s))  Culture, blood (routine x 2)     Status: None (Preliminary result)   Collection Time: 07/01/2017  2:03 PM  Result Value Ref Range Status   Specimen Description BLOOD RIGHT FOREARM  Final   Special Requests   Final    BOTTLES DRAWN AEROBIC AND ANAEROBIC Blood Culture adequate volume   Culture   Final    NO GROWTH 4 DAYS Performed at [facility], [address] [place], [place] [address]    Report Status PENDING  Incomplete    Culture, blood (routine x 2)     Status: None (Preliminary result)   Collection Time: 07/13/2017  2:55 PM  Result Value Ref Range Status   Specimen Description BLOOD LEFT HAND  Final   Special Requests   Final    BOTTLES DRAWN AEROBIC AND ANAEROBIC Blood Culture adequate volume   Culture   Final    NO GROWTH 4 DAYS Performed at [facility], [address], [place], [place] [address]    Report Status PENDING  Incomplete  Urine culture     Status: None   Collection Time: 07/01/2017  6:05 PM  Result Value Ref Range Status   Specimen Description URINE, RANDOM  Final   Special Requests NONE  Final   Culture   Final    NO GROWTH Performed at [facility], [address] [place], [place] [address]    Report Status 06/25/2017 FINAL  Final  MRSA PCR Screening     Status: None   Collection Time: 07/12/2017 11:00 PM  Result Value Ref Range Status   MRSA by PCR NEGATIVE NEGATIVE Final  Comment:        The GeneXpert MRSA Assay (FDA approved for NASAL specimens only), is one component of a comprehensive MRSA colonization surveillance program. It is not intended to diagnose MRSA infection nor to guide or monitor treatment for MRSA infections. Performed at [facility], [address], [place], [place] [address]   Culture, blood (routine x 2)     Status: None (Preliminary result)   Collection Time: 06/27/17  9:19 AM  Result Value Ref Range Status   Specimen Description BLOOD LEFT HAND  Final   Special Requests   Final    BOTTLES DRAWN AEROBIC ONLY Blood Culture results may not be optimal due to an inadequate volume of blood received in culture bottles   Culture   Final    NO GROWTH < 24 HOURS Performed at [facility], [address] [place], [place] [address]    Report Status PENDING  Incomplete  Culture, blood (routine x 2)     Status: None (Preliminary result)   Collection Time: 06/27/17  9:30 AM  Result Value Ref Range Status   Specimen Description  BLOOD LEFT ARM  Final   Special Requests   Final    BOTTLES DRAWN AEROBIC ONLY Blood Culture results may not be optimal due to an inadequate volume of blood received in culture bottles   Culture   Final    NO GROWTH < 24 HOURS Performed at [facility], [address], [place], [place] [address]    Report Status PENDING  Incomplete  Respiratory Panel by PCR     Status: None   Collection Time: 06/27/17  3:49 PM  Result Value Ref Range Status   Adenovirus NOT DETECTED NOT DETECTED Final   Coronavirus 229E NOT DETECTED NOT DETECTED Final   Coronavirus HKU1 NOT DETECTED NOT DETECTED Final   Coronavirus NL63 NOT DETECTED NOT DETECTED Final   Coronavirus OC43 NOT DETECTED NOT DETECTED Final   Metapneumovirus NOT DETECTED NOT DETECTED Final   Rhinovirus / Enterovirus NOT DETECTED NOT DETECTED Final   Influenza A NOT DETECTED NOT DETECTED Final   Influenza B NOT DETECTED NOT DETECTED Final   Parainfluenza Virus 1 NOT DETECTED NOT DETECTED Final   Parainfluenza Virus 2 NOT DETECTED NOT DETECTED Final   Parainfluenza Virus 3 NOT DETECTED NOT DETECTED Final   Parainfluenza Virus 4 NOT DETECTED NOT DETECTED Final   Respiratory Syncytial Virus NOT DETECTED NOT DETECTED Final   Bordetella pertussis NOT DETECTED NOT DETECTED Final   Chlamydophila pneumoniae NOT DETECTED NOT DETECTED Final   Mycoplasma pneumoniae NOT DETECTED NOT DETECTED Final    Comment: Performed at [facility], [address], [place], [place] [address]         Radiology Studies: Ct Chest Wo Contrast  Result Date: 06/27/2017 CLINICAL DATA:  Acute respiratory illness. Lung opacities on radiography. EXAM: CT CHEST WITHOUT CONTRAST TECHNIQUE: Multidetector CT imaging of the chest was performed following the standard protocol without IV contrast. COMPARISON:  Chest radiograph, 06/25/2017.  Chest CT, 02/18/2017. FINDINGS: Cardiovascular: Heart borderline enlarged. There are three-vessel coronary artery  calcifications. No pericardial effusion. Great vessels are normal in caliber. Aortic atherosclerotic calcifications extend from the arch through the descending portion. Mediastinum/Nodes: No neck base or axillary masses or enlarged lymph nodes. There are multiple prominent mildly enlarged mediastinal lymph nodes, largest a prevascular node measuring 17 mm in short axis. Increased soft tissue surrounds the hilar structures, which is also likely mild adenopathy.  Esophagus is distended from the GE junction superiorly. It bulges against the posterior wall the trachea which is mildly flattened. Trachea is otherwise patent. Lungs/Pleura: Small pleural effusions. There are extensive, patchy, bilateral ground-glass and more confluent airspace opacities throughout both lungs, all lobes, with intervening areas of normal lung attenuation. No lung masses. No pneumothorax. Upper Abdomen: Gallstones.  No acute findings. Musculoskeletal: No fracture or acute finding. No osteoblastic or osteolytic lesions. Advanced arthropathic changes noted of both shoulders. IMPRESSION: 1. Extensive, patchy, bilateral ground-glass and more confluent airspace opacities throughout both lungs associated with small pleural effusions and mild hilar mediastinal adenopathy, presumed reactive. Findings are consistent with extensive pneumonia. 2. Coronary artery calcifications.  Aortic atherosclerosis. 3. Distended esophagus. Esophagus was also distended on the prior CT. Consider achalasia in the proper clinical setting. Aortic Atherosclerosis (ICD10-I70.0). Electronically Signed   By: Amie Portland M.D.   On: 06/27/2017 21:04        Scheduled Meds: . mouth rinse  15 mL Mouth Rinse BID  . OLANZapine  5 mg Oral Daily   Continuous Infusions:    LOS: 4 days    Time spent:35 mins. More than 50% of that time was spent in counseling and/or coordination of care.      Burnadette Pop, MD [facility] Pager [phone]  If 7PM-7AM,  please contact night-coverage [email or web address] Password [facility] 06/28/2017, 12:44 PM

## 2017-06-28 NOTE — Consult Note (Signed)
[facility]  Date of Admission:  07/06/2017   Total days of antibiotics 5        Vancomycin 5/6-5/7                                                                                     Clindamycin 5/6-5/7                                                                                     Aztreonam 5/6-5/8                                                                                     Azithromycin 5/9>                                                                                      Flagyl 5/8>                                                                                      Cefepime 5/8>   Assessment:  Debra Johnston is a 84 y.o. female with PMH of PAF not on anticoagulation due to recurrent falls, HFpEF, CKD 3, HTN, and hypothyroidism who presented to the ED on 5/6 due to generalized weakness and dyspnea and found to be septic 2/2 RUL pneumonia (HCAP vs aspiration). Her respiratory status continues to worsen and she is now on BiPAP.   Bcx 5/6 negative at 2 days  Bcx 5/9 no growth in 24 hours  Ucx negative RVP negative   HIV negative   Multifocal Pneumonia, HCAP   Plan: 1. Patient is now comfort care and antibiotic therapy has been discontinued.    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 mouth rinse  15 mL  Mouth Rinse BID  . OLANZapine  5 mg Oral Daily   Continuous Infusions: PRN Meds:.acetaminophen, antiseptic oral rinse, baclofen, bisacodyl, haloperidol **OR** haloperidol **OR** haloperidol lactate, LORazepam **OR** LORazepam **OR** LORazepam, morphine injection, ondansetron (ZOFRAN) IV, senna-docusate   SUBJECTIVE: Afebrile. Unfortunately, patient's respiratory status continues to worsen and she is now on BiPAP as of this morning. She is now comfort care after GOC discussion between palliative care and family.   Review of Systems: Unable to obtain. Patient resting comfortably in bed. On morphine drip.    Allergies  Allergen Reactions  . Penicillins Swelling and Rash    Per med list, has tolerated ceftriaxone in the past  Has patient had a PCN reaction causing immediate rash, facial/tongue/throat swelling, SOB or lightheadedness with hypotension: yes Has patient had a PCN reaction causing severe rash involving mucus membranes or skin necrosis: unk Has patient had a PCN reaction that required hospitalization: unk Has patient had a PCN reaction occurring within the last 10 years: unk If all of the above answers are "NO", then may proceed with Cephalosporin use.     OBJECTIVE: Vitals:   06/28/17 0813 06/28/17 0815 06/28/17 0835 06/28/17 0838  BP:    (!) 102/27  Pulse:   64 65  Resp:   (!) 25 (!) 24  Temp:      TempSrc:      SpO2: (!) 85% (!) 86% 97% (!) 89%  Weight:      Height:       Body mass index is 28.66 kg/m.  Physical Exam  General: chronically ill appearing elderly female, resting comfortably in bed in no acute distress Cardiac: regular rate and rhythm, nl S1/S2, no murmurs, rubs or gallops  Pulm: diffuse coarse breath sounds, no increased work of breathing  Abd: soft, bowel sounds present  Neuro: sleeping when seen, now on morphine drip    Lab Results Lab Results  Component Value Date   WBC 22.9 (H) 06/28/2017   HGB 11.0 (L) 06/28/2017   HCT 36.2 06/28/2017    MCV 96.8 06/28/2017   PLT 506 (H) 06/28/2017    Lab Results  Component Value Date   CREATININE 0.96 06/26/2017   BUN 13 06/26/2017   NA 141 06/26/2017   K 3.5 06/26/2017   CL 108 06/26/2017   CO2 25 06/26/2017    Lab Results  Component Value Date   ALT 20 02/18/2017   AST 30 02/18/2017   ALKPHOS 73 02/18/2017   BILITOT 0.6 02/18/2017     Microbiology: Recent Results (from the past 240 hour(s))  Culture, blood (routine x 2)     Status: None (Preliminary result)   Collection Time: 07/16/2017  2:03 PM  Result Value Ref Range Status   Specimen Description BLOOD RIGHT FOREARM  Final   Special Requests   Final    BOTTLES DRAWN AEROBIC AND ANAEROBIC Blood Culture adequate volume   Culture  Final    NO GROWTH 4 DAYS Performed at [facility], [address], [place], [place] [address]    Report Status PENDING  Incomplete  Culture, blood (routine x 2)     Status: None (Preliminary result)   Collection Time: 07/03/2017  2:55 PM  Result Value Ref Range Status   Specimen Description BLOOD LEFT HAND  Final   Special Requests   Final    BOTTLES DRAWN AEROBIC AND ANAEROBIC Blood Culture adequate volume   Culture   Final    NO GROWTH 4 DAYS Performed at [facility], [address] [place], [place] [address]    Report Status PENDING  Incomplete  Urine culture     Status: None   Collection Time: 07/17/2017  6:05 PM  Result Value Ref Range Status   Specimen Description URINE, RANDOM  Final   Special Requests NONE  Final   Culture   Final    NO GROWTH Performed at [facility], [address] [place], [place] [address]    Report Status 06/25/2017 FINAL  Final  MRSA PCR Screening     Status: None   Collection Time: 07/15/2017 11:00 PM  Result Value Ref Range Status   MRSA by PCR NEGATIVE NEGATIVE Final    Comment:        The GeneXpert MRSA Assay (FDA approved for NASAL specimens only), is one component of a comprehensive MRSA colonization surveillance  program. It is not intended to diagnose MRSA infection nor to guide or monitor treatment for MRSA infections. Performed at [facility], [address], [place], [place] [address]   Culture, blood (routine x 2)     Status: None (Preliminary result)   Collection Time: 06/27/17  9:19 AM  Result Value Ref Range Status   Specimen Description BLOOD LEFT HAND  Final   Special Requests   Final    BOTTLES DRAWN AEROBIC ONLY Blood Culture results may not be optimal due to an inadequate volume of blood received in culture bottles   Culture   Final    NO GROWTH < 24 HOURS Performed at [facility], [address] [place] [address] [place], [place] [address]    Report Status PENDING  Incomplete  Culture, blood (routine x 2)     Status: None (Preliminary result)   Collection Time: 06/27/17  9:30 AM  Result Value Ref Range Status   Specimen Description BLOOD LEFT ARM  Final   Special Requests   Final    BOTTLES DRAWN AEROBIC ONLY Blood Culture results may not be optimal due to an inadequate volume of blood received in culture bottles   Culture   Final    NO GROWTH < 24 HOURS Performed at [facility], [address], [place], [place] [address]    Report Status PENDING  Incomplete  Respiratory Panel by PCR     Status: None   Collection Time: 06/27/17  3:49 PM  Result Value Ref Range Status   Adenovirus NOT DETECTED NOT DETECTED Final   Coronavirus 229E NOT DETECTED NOT DETECTED Final   Coronavirus HKU1 NOT DETECTED NOT DETECTED Final   Coronavirus NL63 NOT DETECTED NOT DETECTED Final   Coronavirus OC43 NOT DETECTED NOT DETECTED Final   Metapneumovirus NOT DETECTED NOT DETECTED Final   Rhinovirus / Enterovirus NOT DETECTED NOT DETECTED Final   Influenza A NOT DETECTED NOT DETECTED Final   Influenza B NOT DETECTED NOT DETECTED Final   Parainfluenza  Virus 1 NOT DETECTED NOT DETECTED Final   Parainfluenza Virus 2 NOT DETECTED NOT DETECTED Final   Parainfluenza Virus 3 NOT DETECTED NOT  DETECTED Final   Parainfluenza Virus 4 NOT DETECTED NOT DETECTED Final   Respiratory Syncytial Virus NOT DETECTED NOT DETECTED Final   Bordetella pertussis NOT DETECTED NOT DETECTED Final   Chlamydophila pneumoniae NOT DETECTED NOT DETECTED Final   Mycoplasma pneumoniae NOT DETECTED NOT DETECTED Final    Comment: Performed at [facility], [address] [place], [place] [address]    Burna Cash, MD [facility] [phone] pager   [phone] cell 06/28/2017, 10:40 AM

## 2017-06-28 NOTE — Progress Notes (Signed)
Saw nurse and learned patient placed on comfort care.  Went in to be with family and offer pastoral presence-family said they are good right now to please check back later. Will check back later after seeing some other patients.Phebe Colla, Chaplain   06/28/17 1100  Clinical Encounter Type  Visited With Patient and family together  Visit Type Initial  Referral From Nurse  Consult/Referral To Chaplain  Spiritual Encounters  Spiritual Needs Other (Comment) (offered spiritual support-not ready now-asked me to ck back )

## 2017-06-29 LAB — CULTURE, BLOOD (ROUTINE X 2)
Culture: NO GROWTH
Culture: NO GROWTH
SPECIAL REQUESTS: ADEQUATE
Special Requests: ADEQUATE

## 2017-06-29 MED ORDER — SODIUM CHLORIDE 0.9 % IV SOLN
INTRAVENOUS | Status: DC
  Administered 2017-06-29: 13:00:00 via INTRAVENOUS

## 2017-06-29 MED ORDER — LORAZEPAM 2 MG/ML IJ SOLN
0.5000 mg | Freq: Two times a day (BID) | INTRAMUSCULAR | Status: DC
  Administered 2017-06-29: 0.5 mg via INTRAVENOUS
  Filled 2017-06-29: qty 1

## 2017-06-29 MED ORDER — GLYCOPYRROLATE 0.2 MG/ML IJ SOLN: 0.2000 mg | INTRAMUSCULAR | Status: DC | PRN

## 2017-06-29 MED ORDER — MORPHINE 100MG IN NS 100ML (1MG/ML) PREMIX INFUSION
2.0000 mg/h | INTRAVENOUS | Status: DC
  Administered 2017-06-29: 1 mg/h via INTRAVENOUS
  Administered 2017-07-01: 2 mg/h via INTRAVENOUS
  Filled 2017-06-29 (×2): qty 100

## 2017-06-29 MED ORDER — GLYCOPYRROLATE 0.2 MG/ML IJ SOLN
0.4000 mg | Freq: Three times a day (TID) | INTRAMUSCULAR | Status: DC
  Administered 2017-06-29 – 2017-06-30 (×3): 0.4 mg via INTRAVENOUS
  Filled 2017-06-29 (×4): qty 2

## 2017-06-29 MED ORDER — MORPHINE BOLUS VIA INFUSION
2.0000 mg | INTRAVENOUS | Status: DC | PRN
  Administered 2017-06-29 (×2): 2 mg via INTRAVENOUS
  Filled 2017-06-29: qty 2

## 2017-06-29 MED ORDER — LORAZEPAM 2 MG/ML IJ SOLN
0.5000 mg | INTRAMUSCULAR | Status: DC | PRN
  Administered 2017-06-30: 1 mg via INTRAVENOUS
  Filled 2017-06-29: qty 1

## 2017-06-29 NOTE — Progress Notes (Signed)
CSW following for discharge plan. Per chart review, patient has been moved to comfort care and is not at this time stable for transfer to residential hospice. Please consult CSW if patient becomes stable for transport and could look into residential hospice placement.  Blenda Nicely, [place] Clinical Social Worker [phone]

## 2017-06-29 NOTE — Progress Notes (Signed)
PROGRESS NOTE    IZETTA SAKAMOTO  QIO:[id number] DOB: 11-Nov-1927 DOA: 06/30/2017 PCP: Patient, No Pcp Per   Brief Narrative: Patient is a 84 year old female with past medical history of proximal A. fib not on anticoagulation, chronic diastolic CHF, chronic kidney disease stage III, hypertension, osteoarthritis, hyperlipidemia, hypothyroidism who  sent from nursing home due to progressive weakness and shortness of breath.  Patient was suspected to have healthcare associated pneumonia versus aspiration pneumonia and was started on broad-spectrum antibiotics, high flow oxygen.  Currently on comfort care.Actively dying.  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  Acute on chronic hypoxic respiratory failure: Uses oxygen at nursing home. CT as above  History of Afib :patient's CHA2DS2-VASc Score for Stroke Risk is> 2,however not a candidate for anticoagulation per previous assessments due to recurrent falls.  Hypertension: Was on  bisoprolol.    History of chronic diastolic CHF: Meds stopped for comfort care.  CKD stage III: Kidney function was on baseline.   Anemia of chronic disease: Currently H and H was   Stable.  Hypothyroidism: TSH was stable.   Severe deconditioning/functional paraplegia: History of severe osteoarthritis, back surgery.  She is chronically bedbound.   Multiple comorbidities/poor prognosis:Initiated full comfort care as per palliative care. Anticipate hospital death.If she improves,plan is for hospice.   DVT prophylaxis: None Code Status: Comfort care Family Communication: Discussed with family at bed side disposition Plan: Hospital death vs Hospice   Consultants: None  Procedures: None  Antimicrobials: None  Subjective: Patient lethargic,unresponsive this morning. On comfort care  Objective: Vitals:   06/28/17 0815 06/28/17 0835 06/28/17 0838 06/28/17 2011  BP:   (!) 102/27   Pulse:  64 65   Resp:  (!) 25 (!) 24 (!) 23  Temp:      TempSrc:      SpO2: (!) 86% 97% (!) 89%   Weight:      Height:        Intake/Output Summary (Last 24 hours) at 06/29/2017 1347 Last data filed at 06/29/2017 0548 Gross per 24 hour  Intake -  Output 300 ml  Net -300 ml   Filed Weights   07/13/2017 2149  Weight: 90.6 kg (199 lb 11.8 oz)    Examination:  General exam: terminally ill,actively dying Respiratory system: Bilateral decreased air entry, bilateral rhonchi, coarse breathing sounds, basal crackles,laboured breathing Cardiovascular system: S1 & S2 heard, RRR. No JVD, murmurs, rubs, gallops or clicks. Gastrointestinal system: Abdomen is nondistended, soft and nontender. Central nervous system:Not  Alert and oriented. Extremities: No edema, no clubbing ,no cyanosis, distal peripheral pulses palpable. Skin: No rashes, lesions or ulcers,no icterus ,no pallor  Data Reviewed: I have personally reviewed following labs and imaging studies  CBC:  Recent Labs  Lab 06/27/2017 1336 06/25/17 0557 06/26/17 0233 06/27/17 0206 06/28/17 0335  WBC 17.5* 17.4* 19.7* 21.8* 22.9*  NEUTROABS 13.6*  --   --  16.8* 16.7*  HGB 11.7* 11.2* 10.9* 10.7* 11.0*  HCT 36.7 36.2 35.3*  34.1* 36.2  MCV 92.9 94.5 94.9 95.5 96.8  PLT 511* 490* 482* 512* 506*   Basic Metabolic Panel: Recent Labs  Lab 06/23/2017 1336 06/25/17 0557 06/26/17 0233  NA 140 142 141  K 4.3 3.8 3.5  CL 106 111 108  CO2 GLUCOSE 130* 119* 153*  BUN CREATININE 1.07* 0.95 0.96  CALCIUM 9.5 9.0 9.1   GFR: Estimated Creatinine Clearance: 50.4 mL/min (by C-G formula based on SCr of 0.96 mg/dL). Liver Function Tests: No results for input(s): AST, ALT, ALKPHOS, BILITOT, PROT, ALBUMIN in the last 168 hours. No results for input(s): LIPASE, AMYLASE in the last 168 hours. No results for input(s): AMMONIA in the last 168 hours. Coagulation Profile: No results for input(s): INR, PROTIME in the last 168 hours. Cardiac Enzymes: No results for input(s): CKTOTAL, CKMB, CKMBINDEX, TROPONINI in the last 168 hours. BNP (last 3 results) Recent Labs    06/06/17 1441  PROBNP 45.0   HbA1C: No results for input(s): HGBA1C in the last 72 hours. CBG: No results for input(s): GLUCAP in the last 168 hours. Lipid Profile: No results for input(s): CHOL, HDL, LDLCALC, TRIG, CHOLHDL, LDLDIRECT in the last 72 hours. Thyroid Function Tests: No results for input(s): TSH, T4TOTAL, FREET4, T3FREE, THYROIDAB in the last 72 hours. Anemia Panel: No results for input(s): VITAMINB12, FOLATE, FERRITIN, TIBC, IRON, RETICCTPCT in the last 72 hours. Sepsis Labs: Recent Labs  Lab 06/28/2017 1558 07/14/2017 1801  LATICACIDVEN 2.28* 1.17    Recent Results (from the past 240 hour(s))  Culture, blood (routine x 2)     Status: None   Collection Time: 06/29/2017  2:03 PM  Result Value Ref Range Status   Specimen Description BLOOD RIGHT FOREARM  Final   Special Requests   Final    BOTTLES DRAWN AEROBIC AND ANAEROBIC Blood Culture adequate volume   Culture   Final    NO GROWTH 5 DAYS Performed at [facility], [address], [place], [place] [address]    Report Status 06/29/2017 FINAL  Final    Culture, blood (routine x 2)     Status: None   Collection Time: 07/19/2017  2:55 PM  Result Value Ref Range Status   Specimen Description BLOOD LEFT HAND  Final   Special Requests   Final    BOTTLES DRAWN AEROBIC AND ANAEROBIC Blood Culture adequate volume   Culture   Final    NO GROWTH 5 DAYS Performed at [facility], [address], [place], [place] [address]    Report Status 06/29/2017 FINAL  Final  Urine culture     Status: None   Collection Time: 06/23/2017  6:05 PM  Result Value Ref Range Status   Specimen Description URINE, RANDOM  Final   Special Requests NONE  Final   Culture   Final    NO GROWTH Performed at [facility], [address] [place], [place] [address]    Report Status 06/25/2017 FINAL  Final  MRSA PCR Screening     Status: None   Collection Time: 07/17/2017 11:00 PM  Result Value Ref Range Status   MRSA by PCR NEGATIVE NEGATIVE Final    Comment:  The GeneXpert MRSA Assay (FDA approved for NASAL specimens only), is one component of a comprehensive MRSA colonization surveillance program. It is not intended to diagnose MRSA infection nor to guide or monitor treatment for MRSA infections. Performed at [facility], [address], [place], [place] [address]   Culture, blood (routine x 2)     Status: None (Preliminary result)   Collection Time: 06/27/17  9:19 AM  Result Value Ref Range Status   Specimen Description BLOOD LEFT HAND  Final   Special Requests   Final    BOTTLES DRAWN AEROBIC ONLY Blood Culture results may not be optimal due to an inadequate volume of blood received in culture bottles   Culture   Final    NO GROWTH 2 DAYS Performed at [facility], [address] [place], [place] [address]    Report Status PENDING  Incomplete  Culture, blood (routine x 2)     Status: None (Preliminary result)   Collection Time: 06/27/17  9:30 AM  Result Value Ref Range Status   Specimen Description BLOOD LEFT ARM  Final    Special Requests   Final    BOTTLES DRAWN AEROBIC ONLY Blood Culture results may not be optimal due to an inadequate volume of blood received in culture bottles   Culture   Final    NO GROWTH 2 DAYS Performed at [facility], [address] [place], [place] [address]    Report Status PENDING  Incomplete  Respiratory Panel by PCR     Status: None   Collection Time: 06/27/17  3:49 PM  Result Value Ref Range Status   Adenovirus NOT DETECTED NOT DETECTED Final   Coronavirus 229E NOT DETECTED NOT DETECTED Final   Coronavirus HKU1 NOT DETECTED NOT DETECTED Final   Coronavirus NL63 NOT DETECTED NOT DETECTED Final   Coronavirus OC43 NOT DETECTED NOT DETECTED Final   Metapneumovirus NOT DETECTED NOT DETECTED Final   Rhinovirus / Enterovirus NOT DETECTED NOT DETECTED Final   Influenza A NOT DETECTED NOT DETECTED Final   Influenza B NOT DETECTED NOT DETECTED Final   Parainfluenza Virus 1 NOT DETECTED NOT DETECTED Final   Parainfluenza Virus 2 NOT DETECTED NOT DETECTED Final   Parainfluenza Virus 3 NOT DETECTED NOT DETECTED Final   Parainfluenza Virus 4 NOT DETECTED NOT DETECTED Final   Respiratory Syncytial Virus NOT DETECTED NOT DETECTED Final   Bordetella pertussis NOT DETECTED NOT DETECTED Final   Chlamydophila pneumoniae NOT DETECTED NOT DETECTED Final   Mycoplasma pneumoniae NOT DETECTED NOT DETECTED Final    Comment: Performed at [facility], [address], [place], [place] [address]         Radiology Studies: Ct Chest Wo Contrast  Result Date: 06/27/2017 CLINICAL DATA:  Acute respiratory illness. Lung opacities on radiography. EXAM: CT CHEST WITHOUT CONTRAST TECHNIQUE: Multidetector CT imaging of the chest was performed following the standard protocol without IV contrast. COMPARISON:  Chest radiograph, 06/25/2017.  Chest CT, 02/18/2017. FINDINGS: Cardiovascular: Heart borderline enlarged. There are three-vessel coronary artery calcifications. No pericardial effusion.  Great vessels are normal in caliber. Aortic atherosclerotic calcifications extend from the arch through the descending portion. Mediastinum/Nodes: No neck base or axillary masses or enlarged lymph nodes. There are multiple prominent mildly enlarged mediastinal lymph nodes, largest a prevascular node measuring 17 mm in short axis. Increased soft tissue surrounds the hilar structures, which is also likely mild adenopathy. Esophagus is distended from the GE junction superiorly. It bulges  against the posterior wall the trachea which is mildly flattened. Trachea is otherwise patent. Lungs/Pleura: Small pleural effusions. There are extensive, patchy, bilateral ground-glass and more confluent airspace opacities throughout both lungs, all lobes, with intervening areas of normal lung attenuation. No lung masses. No pneumothorax. Upper Abdomen: Gallstones.  No acute findings. Musculoskeletal: No fracture or acute finding. No osteoblastic or osteolytic lesions. Advanced arthropathic changes noted of both shoulders. IMPRESSION: 1. Extensive, patchy, bilateral ground-glass and more confluent airspace opacities throughout both lungs associated with small pleural effusions and mild hilar mediastinal adenopathy, presumed reactive. Findings are consistent with extensive pneumonia. 2. Coronary artery calcifications.  Aortic atherosclerosis. 3. Distended esophagus. Esophagus was also distended on the prior CT. Consider achalasia in the proper clinical setting. Aortic Atherosclerosis (ICD10-I70.0). Electronically Signed   By: Amie Portland M.D.   On: 06/27/2017 21:04        Scheduled Meds: . glycopyrrolate  0.4 mg Intravenous Q8H  . LORazepam  0.5 mg Intravenous BID  . mouth rinse  15 mL Mouth Rinse BID  . OLANZapine  5 mg Oral Daily   Continuous Infusions: . sodium chloride 10 mL/hr at 06/29/17 1309  . morphine 1 mg/hr (06/29/17 1308)     LOS: 5 days    Time spent:25 mins.       Burnadette Pop, MD [facility] Pager [phone]  If 7PM-7AM, please contact night-coverage [email or web address] Password [email or web address] 06/29/2017, 1:47 PM

## 2017-06-29 NOTE — Progress Notes (Addendum)
Palliative Medicine RN Note: AM symptom check.  Patient has been using morphine overnight at shorter and shorter intervals. She is having some labored breathing, which did not get better after the morphine injection I witnessed. RN also gave her prn Ativan.   I will call PMT provider for orders for morphine drip and prn Robinul. Family also reports that she used Ativan at least daily at the SNF, but she is only on prn here; she will likely need scheduled Ativan because of this.   At this time, patient does not appear stable/comfortable for transport to a hospice facility. I will follow up this afternoon to see if she has improved. If she has stabilized, then a hospice referral will be considered at that time.  Margret Chance Loreta Blouch, RN, BSN, [facility] Palliative Medicine Team 06/29/2017 9:50 AM Office [phone]  ADDENDUM: I spoke with NP Eduard Roux. We reviewed patient's home ativan use, as well as her morphine requirements. She gave orders to adjust medication regimen. Will follow up with afternoon.  Margret Chance Murdis Flitton, RN, BSN, [facility] Palliative Medicine Team 06/29/2017 9:59 AM Office [phone]

## 2017-06-30 DIAGNOSIS — Z515 Encounter for palliative care: Secondary | ICD-10-CM

## 2017-06-30 DIAGNOSIS — R52 Pain, unspecified: Secondary | ICD-10-CM

## 2017-06-30 MED ORDER — GLYCOPYRROLATE 0.2 MG/ML IJ SOLN
0.2000 mg | Freq: Three times a day (TID) | INTRAMUSCULAR | Status: DC
  Administered 2017-06-30 – 2017-07-02 (×7): 0.2 mg via INTRAVENOUS
  Filled 2017-06-30 (×7): qty 1

## 2017-06-30 MED ORDER — MORPHINE BOLUS VIA INFUSION
3.0000 mg | INTRAVENOUS | Status: DC | PRN
  Filled 2017-06-30: qty 3

## 2017-06-30 MED ORDER — LORAZEPAM 2 MG/ML IJ SOLN
0.5000 mg | Freq: Three times a day (TID) | INTRAMUSCULAR | Status: DC
  Administered 2017-06-30 – 2017-07-02 (×7): 0.5 mg via INTRAVENOUS
  Filled 2017-06-30 (×7): qty 1

## 2017-06-30 NOTE — Progress Notes (Signed)
Daily Progress Note   Patient Name: Debra Johnston       Date: 06/30/2017 DOB: 02-Feb-1928  Age: 84 y.o. MRN#: [id number] Attending Physician: Burnadette Pop, MD Primary Care Physician: Patient, No Pcp Per Admit Date: 06/20/2017  Reason for Consultation/Follow-up: Establishing goals of care, Non pain symptom management, Pain control, Psychosocial/spiritual support and Terminal Care  Subjective: Patient seen, chart reviewed.  Grandson and daughter-in-law at the bedside.  Patient will open her eyes to voice but is otherwise unresponsive.  She is moaning  Length of Stay: 6  Current Medications: Scheduled Meds:  . glycopyrrolate  0.2 mg Intravenous Q8H  . LORazepam  0.5 mg Intravenous Q8H  . mouth rinse  15 mL Mouth Rinse BID    Continuous Infusions: . sodium chloride 10 mL/hr at 06/29/17 1309  . morphine 1 mg/hr (06/29/17 1308)    PRN Meds: acetaminophen, antiseptic oral rinse, glycopyrrolate, haloperidol **OR** haloperidol **OR** haloperidol lactate, LORazepam, morphine, ondansetron (ZOFRAN) IV, senna-docusate  Physical Exam  Constitutional: She appears well-developed and well-nourished.  Minimally responsive to voice and touch; transitioning towards end-of-life She is moaning  HENT:  Head: Normocephalic and atraumatic.  Cardiovascular: Normal rate.  Pulmonary/Chest:  Poor inspiratory effort Secretions improved  Abdominal: Soft.  Neurological:  Minimally responsive to voice and light touch  Skin:  Cool, pale, mottling to toes and feet  Psychiatric:  No overt agitation otherwise unable to test  Nursing note and vitals reviewed.           Vital Signs: BP (!) 102/27   Pulse 65   Temp 99.4 F (37.4 C) (Axillary)   Resp (!) 23   Ht  (1.778 m)   Wt 90.6 kg (199 lb  11.8 oz)   SpO2 (!) 89%   BMI 28.66 kg/m  SpO2: SpO2: (!) 89 % O2 Device: O2 Device: High Flow Nasal Cannula O2 Flow Rate: O2 Flow Rate (L/min): 10 L/min  Intake/output summary:   Intake/Output Summary (Last 24 hours) at 06/30/2017 1012 Last data filed at 06/30/2017 0000 Gross per 24 hour  Intake 57 ml  Output -  Net 57 ml   LBM: Last BM Date: 06/28/17 Baseline Weight: Weight: 90.6 kg (199 lb 11.8 oz) Most recent weight: Weight: 90.6 kg (199 lb 11.8 oz)       Palliative Assessment/Data:  Flowsheet Rows     Most Recent Value  Intake Tab  Referral Department  Hospitalist  Unit at Time of Referral  Intermediate Care Unit  Palliative Care Primary Diagnosis  Pulmonary  Date Notified  06/27/17  Palliative Care Type  New Palliative care  Reason for referral  Clarify Goals of Care, End of Life Care Assistance  Date of Admission  06-29-2017  Date first seen by Palliative Care  06/28/17  # of days Palliative referral response time  1 Day(s)  # of days IP prior to Palliative referral  3  Clinical Assessment  Palliative Performance Scale Score  20%  Psychosocial & Spiritual Assessment  Palliative Care Outcomes  Patient/Family meeting held?  Yes  Who was at the meeting?  son, DIL, grandson and granddaughter  Palliative Care Outcomes  Improved pain interventions, Clarified goals of care, Provided end of life care assistance, Provided psychosocial or spiritual support, Improved non-pain symptom therapy, Changed to focus on comfort  Patient/Family wishes: Interventions discontinued/not started   Mechanical Ventilation, BiPAP      Patient Active Problem List   Diagnosis Date Noted  . Shortness of breath   . Palliative care by specialist   . Goals of care, counseling/discussion   . End of life care   . Sepsis (HCC)   . Tachypnea   . Essential hypertension 06/08/2017  . Dyspnea on exertion 06/06/2017  . 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 Upper airway cough  syndrome 04/26/2017  . Hallucinations   . Hypoxemia requiring supplemental oxygen   . HCAP (healthcare-associated pneumonia) 02/18/2017  . GAD (generalized anxiety disorder) 09/05/2015  . Urinary retention with incomplete bladder emptying 09/05/2015  . Anemia of chronic disease 09/05/2015  . Major depression, chronic 09/05/2015  . Hyperlipidemia 05/11/2015  . Obesity 05/11/2015  . Adjustment disorder with mixed anxiety and depressed mood 05/11/2015  . Other specified hypothyroidism 05/11/2015  . Iron deficiency 05/11/2015  . Intertrochanteric fracture of right hip (HCC) 05/11/2015  . Anemia 04/06/2015  . Protein-calorie malnutrition (HCC) 04/06/2015  . Preoperative cardiovascular examination   . Fracture, subtrochanteric, right femur, closed (HCC) 03/22/2015  . CKD (chronic kidney disease) stage 3, GFR 30-59 ml/min (HCC) 03/22/2015  . Oxygen dependent 03/22/2015  . Fibromyalgia affecting lower leg 03/16/2015  . Hypertensive heart disease with CHF (congestive heart failure) (HCC) 07/01/2012  . PAF (paroxysmal atrial fibrillation) (HCC) 06/19/2011  . Hypothyroidism 06/19/2011  . Chronic diastolic CHF (congestive heart failure) (HCC) 03/12/2011    Palliative Care Assessment & Plan   Patient Profile: 84 y.o. female  with past medical history of a fib not on anticoagulation d/t recurrent falls, CHF, CKD 3, HTN, OA, HLD, hypothyroidism, and essentially bedbound admitted on 06/29/17 with weakness and shortness of breath. Patients lives at [facility].  On admission, she was diagnosed with multifocal pna. Her respiratory status and CXR have worsened and she is febrile.   PMT consulted for GOC, end of life, hospice evaluation.    Recommendations/Plan:  Pain: Needs improvement.  Will uptitrate morphine continuous infusion to 2 mg an hour and 3 mg every 15 minutes as needed for pain or respiratory distress  Agitation: Improving but will adjust Ativan to 0.5 mg every 8 hours and  continue as needed breakthrough dosing; Haldol as needed.  DC Zyprexa; patient cannot take any oral medications at this point\  Secretions: Improving.  Will decrease scheduled Robinul to 0.2 mg  and continue with breakthrough dosing  Patient would meet residential hospice  criteria however at this point she does appear to unstable for transport.  Have touch base with local hospice representatives and no bed availability in [facility]  Goals of Care and Additional Recommendations:  Limitations on Scope of Treatment: Full Comfort Care  Code Status:    Code Status Orders  (From admission, onward)        Start     Ordered   06/28/17 0915  Do not attempt resuscitation (DNR)  Continuous    Question Answer Comment  In the event of cardiac or respiratory ARREST Do not call a "code blue"   In the event of cardiac or respiratory ARREST Do not perform Intubation, CPR, defibrillation or ACLS   In the event of cardiac or respiratory ARREST Use medication by any route, position, wound care, and other measures to relive pain and suffering. May use oxygen, suction and manual treatment of airway obstruction as needed for comfort.      06/28/17 0916    Code Status History    Date Active Date Inactive Code Status Order ID Comments User Context   06/28/2017 0914 06/28/2017 0916 DNR [id number]  Joylene Draft, NP Inpatient   28-Jun-2017 1909 06/28/2017 0914 DNR [id number]  Leatha Gilding, MD ED   02/18/2017 1853 02/22/2017 1720 Full Code [id number]  Wendee Beavers, DO Inpatient   03/22/2015 0125 04/01/2015 1639 Full Code [id number]  Briscoe Deutscher, MD ED   10/09/2013 2307 10/11/2013 1756 Full Code [id number]  Yaakov Guthrie, MD Inpatient   06/19/2011 0206 06/26/2011 1328 Full Code [id number]  Primus Bravo, RN Inpatient    Advance Directive Documentation     Most Recent Value  Type of Advance Directive  Living will  Pre-existing out of facility DNR order (yellow form or pink MOST  form)  -  "MOST" Form in Place?  -       Prognosis:   Hours - Days  Discharge Planning:  Anticipated Hospital Death  0830  Thank you for allowing the Palliative Medicine Team to assist in the care of this patient.   Time In: 0830 Time Out: 0900 Total Time 30 mi Prolonged Time Billed  no       Greater than 50%  of this time was spent counseling and coordinating care related to the above assessment and plan.  Irean Hong, NP  Please contact Palliative Medicine Team phone at [phone] for questions and concerns.

## 2017-06-30 NOTE — Progress Notes (Signed)
PROGRESS NOTE    CERRIA RANDHAWA  ZOX:[id number] DOB: 05/03/1927 DOA: 06/19/2017 PCP: Patient, No Pcp Per   Brief Narrative: Patient is a 84 year old female with past medical history of proximal A. fib not on anticoagulation, chronic diastolic CHF, chronic kidney disease stage III, hypertension, osteoarthritis, hyperlipidemia, hypothyroidism who  sent from nursing home due to progressive weakness and shortness of breath.  Patient was suspected to have healthcare associated pneumonia versus aspiration pneumonia and was started on broad-spectrum antibiotics, high flow oxygen.  Currently on comfort care.Actively dying.  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  Acute on chronic hypoxic respiratory failure: Uses oxygen at nursing home. CT as above  History of Afib :patient's CHA2DS2-VASc Score for Stroke Risk is> 2,however not a candidate for anticoagulation per previous assessments due to recurrent falls.  Hypertension: Was on  bisoprolol.    History of chronic diastolic CHF: Meds stopped for comfort care.  CKD stage III: Kidney function was on baseline.   Anemia of chronic disease:  Currently H and H was  Stable.  Hypothyroidism: TSH was stable.   Severe deconditioning/functional paraplegia: History of severe osteoarthritis, back surgery.  She is chronically bedbound.   Multiple comorbidities/poor prognosis:Initiated full comfort care as per palliative care. Anticipate hospital death.If she improves,plan is for hospice.   DVT prophylaxis: None Code Status: Comfort care Family Communication: Discussed with family at bed side disposition Plan: Hospital death vs Hospice   Consultants: None  Procedures: None  Antimicrobials: None  Subjective: Patient lethargic,unresponsive this morning. On comfort care.  Family at the bedside ,no new issues. Objective: Vitals:   06/28/17 0815 06/28/17 0835 06/28/17 0838 06/28/17 2011  BP:   (!) 102/27   Pulse:  64 65   Resp:  (!) 25 (!) 24 (!) 23  Temp:      TempSrc:      SpO2: (!) 86% 97% (!) 89%   Weight:      Height:        Intake/Output Summary (Last 24 hours) at 06/30/2017 1216 Last data filed at 06/30/2017 1100 Gross per 24 hour  Intake 178.48 ml  Output -  Net 178.48 ml   Filed Weights   07/15/2017 2149  Weight: 90.6 kg (199 lb 11.8 oz)    Examination:  General exam: terminally ill,actively dying Respiratory system: Bilateral decreased air entry, bilateral rhonchi, coarse breathing sounds, basal crackles,laboured breathing Cardiovascular system: S1 & S2 heard, RRR. No JVD, murmurs, rubs, gallops or clicks. Gastrointestinal system: Abdomen is nondistended, soft and nontender. Central nervous system:Not  Alert and oriented. Extremities: No edema, no clubbing ,no cyanosis, distal peripheral pulses palpable. Skin: No rashes, lesions or ulcers,no icterus ,no pallor  Data Reviewed:  I have personally reviewed following labs and imaging studies  CBC: Recent Labs  Lab 06/28/17 1336 06/25/17 0557 06/26/17 0233 06/27/17 0206 06/28/17 0335  WBC 17.5* 17.4* 19.7* 21.8* 22.9*  NEUTROABS 13.6*  --   --   16.8* 16.7*  HGB 11.7* 11.2* 10.9* 10.7* 11.0*  HCT 36.7 36.2 35.3* 34.1* 36.2  MCV 92.9 94.5 94.9 95.5 96.8  PLT 511* 490* 482* 512* 506*   Basic Metabolic Panel: Recent Labs  Lab 06-28-2017 1336 06/25/17 0557 06/26/17 0233  NA 140 142 141  K 4.3 3.8 3.5  CL 106 111 108  CO2 GLUCOSE 130* 119* 153*  BUN CREATININE 1.07* 0.95 0.96  CALCIUM 9.5 9.0 9.1   GFR: Estimated Creatinine Clearance: 50.4 mL/min (by C-G formula based on SCr of 0.96 mg/dL). Liver Function Tests: No results for input(s): AST, ALT, ALKPHOS, BILITOT, PROT, ALBUMIN in the last 168 hours. No results for input(s): LIPASE, AMYLASE in the last 168 hours. No results for input(s): AMMONIA in the last 168 hours. Coagulation Profile: No results for input(s): INR, PROTIME in the last 168 hours. Cardiac Enzymes: No results for input(s): CKTOTAL, CKMB, CKMBINDEX, TROPONINI in the last 168 hours. BNP (last 3 results) Recent Labs    06/06/17 1441  PROBNP 45.0   HbA1C: No results for input(s): HGBA1C in the last 72 hours. CBG: No results for input(s): GLUCAP in the last 168 hours. Lipid Profile: No results for input(s): CHOL, HDL, LDLCALC, TRIG, CHOLHDL, LDLDIRECT in the last 72 hours. Thyroid Function Tests: No results for input(s): TSH, T4TOTAL, FREET4, T3FREE, THYROIDAB in the last 72 hours. Anemia Panel: No results for input(s): VITAMINB12, FOLATE, FERRITIN, TIBC, IRON, RETICCTPCT in the last 72 hours. Sepsis Labs: Recent Labs  Lab Jun 28, 2017 1558 Jun 28, 2017 1801  LATICACIDVEN 2.28* 1.17    Recent Results (from the past 240 hour(s))  Culture, blood (routine x 2)     Status: None   Collection Time: 06-28-2017  2:03 PM  Result Value Ref Range Status   Specimen Description BLOOD RIGHT FOREARM  Final   Special Requests   Final    BOTTLES DRAWN AEROBIC AND ANAEROBIC Blood Culture adequate volume   Culture   Final    NO GROWTH 5 DAYS Performed at [facility], [address]  [place], [place] [address]    Report Status 06/29/2017 FINAL  Final  Culture, blood (routine x 2)     Status: None   Collection Time: 06/28/2017  2:55 PM  Result Value Ref Range Status   Specimen Description BLOOD LEFT HAND  Final   Special Requests   Final    BOTTLES DRAWN AEROBIC AND ANAEROBIC Blood Culture adequate volume   Culture   Final    NO GROWTH 5 DAYS Performed at [facility], [address], [place], [place] [address]    Report Status 06/29/2017 FINAL  Final  Urine culture     Status: None   Collection Time: 06-28-2017  6:05 PM  Result Value Ref Range Status   Specimen Description URINE, RANDOM  Final   Special Requests NONE  Final   Culture   Final    NO GROWTH Performed at [facility], [address], [place], [place] [address]    Report Status 06/25/2017 FINAL  Final  MRSA PCR Screening     Status: None   Collection Time: 2017-06-28 11:00 PM  Result Value Ref Range Status   MRSA  by PCR NEGATIVE NEGATIVE Final    Comment:        The GeneXpert MRSA Assay (FDA approved for NASAL specimens only), is one component of a comprehensive MRSA colonization surveillance program. It is not intended to diagnose MRSA infection nor to guide or monitor treatment for MRSA infections. Performed at [facility], [address] [place], [place] [address]   Culture, blood (routine x 2)     Status: None (Preliminary result)   Collection Time: 06/27/17  9:19 AM  Result Value Ref Range Status   Specimen Description BLOOD LEFT HAND  Final   Special Requests   Final    BOTTLES DRAWN AEROBIC ONLY Blood Culture results may not be optimal due to an inadequate volume of blood received in culture bottles   Culture   Final    NO GROWTH 3 DAYS Performed at [facility], [address], [place], [place] [address]    Report Status PENDING  Incomplete  Culture, blood (routine x 2)     Status: None (Preliminary result)   Collection Time: 06/27/17  9:30 AM  Result Value  Ref Range Status   Specimen Description BLOOD LEFT ARM  Final   Special Requests   Final    BOTTLES DRAWN AEROBIC ONLY Blood Culture results may not be optimal due to an inadequate volume of blood received in culture bottles   Culture   Final    NO GROWTH 3 DAYS Performed at [facility], [address], [place], [place] [address]    Report Status PENDING  Incomplete  Respiratory Panel by PCR     Status: None   Collection Time: 06/27/17  3:49 PM  Result Value Ref Range Status   Adenovirus NOT DETECTED NOT DETECTED Final   Coronavirus 229E NOT DETECTED NOT DETECTED Final   Coronavirus HKU1 NOT DETECTED NOT DETECTED Final   Coronavirus NL63 NOT DETECTED NOT DETECTED Final   Coronavirus OC43 NOT DETECTED NOT DETECTED Final   Metapneumovirus NOT DETECTED NOT DETECTED Final   Rhinovirus / Enterovirus NOT DETECTED NOT DETECTED Final   Influenza A NOT DETECTED NOT DETECTED Final   Influenza B NOT DETECTED NOT DETECTED Final   Parainfluenza Virus 1 NOT DETECTED NOT DETECTED Final   Parainfluenza Virus 2 NOT DETECTED NOT DETECTED Final   Parainfluenza Virus 3 NOT DETECTED NOT DETECTED Final   Parainfluenza Virus 4 NOT DETECTED NOT DETECTED Final   Respiratory Syncytial Virus NOT DETECTED NOT DETECTED Final   Bordetella pertussis NOT DETECTED NOT DETECTED Final   Chlamydophila pneumoniae NOT DETECTED NOT DETECTED Final   Mycoplasma pneumoniae NOT DETECTED NOT DETECTED Final    Comment: Performed at [facility], [address] [place], [place] [address]         Radiology Studies: No results found.      Scheduled Meds: . glycopyrrolate  0.2 mg Intravenous Q8H  . LORazepam  0.5 mg Intravenous Q8H  . mouth rinse  15 mL Mouth Rinse BID   Continuous Infusions: . sodium chloride 10 mL/hr at 06/29/17 1309  . morphine 2 mg/hr (06/30/17 1031)     LOS: 6 days    Time spent:25 mins.       Burnadette Pop, MD [facility] Pager [phone]  If 7PM-7AM,  please contact night-coverage [email or web address] Password [facility] 06/30/2017, 12:16 PM

## 2017-07-01 NOTE — Progress Notes (Addendum)
Palliative Medicine RN Note: Daily symptom check.  Patient is on continuous morphine infusion. She is completely non responsive. Periods of apnea longer than 30 seconds during my visit. She is cool and pale. When she does breath, the breaths are short and gasping. Family reports that she did start talking last night, but only to call out "Channing Mutters, come get me." Channing Mutters is her husband, who has passed away.  Prognosis is likely minutes to hours.  Her friend Myriam Jacobson is at bedside (Daughter in law's mother). She reports that her son would want to be here when she dies. I called her son and explained that we expect her death to occur soon but that I didn't think she would be alive this morning. He will be able to leave work in an hour or two and will then come to [facility].  If Mrs Hunsberger survives the night, I will see her again tomorrow.  Margret Chance Neelie Welshans, RN, BSN, [facility] Palliative Medicine Team 07/01/2017 12:53 PM Office [phone]

## 2017-07-01 NOTE — Care Management Note (Signed)
Case Management Note  Patient Details  Name: SERENNA DEROY MRN: [id number] Date of Birth: 02/24/1927  Subjective/Objective:  HCAP,  CHF, PAF, CKD, Sepsis, actively dying, on comfort care,on morphine drip, per MD note expect hospital death, palliative note, she is too unstable to transport.                Action/Plan: Expect hospital expiration.  Expected Discharge Date:                  Expected Discharge Plan:  Skilled Nursing Facility  In-House Referral:  Clinical Social Work  Discharge planning Services  CM Consult  Post Acute Care Choice:    Choice offered to:     DME Arranged:    DME Agency:     HH Arranged:    HH Agency:     Status of Service:  In process, will continue to follow  If discussed at Long Length of Stay Meetings, dates discussed:    Additional Comments:  Leone Haven, RN 07/01/2017, 11:34 AM

## 2017-07-01 NOTE — Care Management Important Message (Signed)
Important Message  Patient Details  Name: Debra Johnston MRN: [id number] Date of Birth: 1927-06-17   Medicare Important Message Given:  Yes    Debra Johnston Stefan Church 07/01/2017, 3:44 PM

## 2017-07-01 NOTE — Progress Notes (Signed)
 PROGRESS NOTE    Debra Johnston  JXB:[id number] DOB: 1927/12/12 DOA: 06/29/2017 PCP: Patient, No Pcp Per   Brief Narrative: Patient is a 84 year old female with past medical history of proximal A. fib not on anticoagulation, chronic diastolic CHF, chronic kidney disease stage III, hypertension, osteoarthritis, hyperlipidemia, hypothyroidism who  sent from nursing home due to progressive weakness and shortness of breath.  Patient was suspected to have healthcare associated pneumonia versus aspiration pneumonia and was started on broad-spectrum antibiotics, high flow oxygen.  Currently on comfort care.Actively dying.  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  Acute on chronic hypoxic respiratory failure: Uses oxygen at nursing home. CT as above  History of Afib :patient's CHA2DS2-VASc Score for Stroke Risk is> 2,however not a candidate for anticoagulation per previous assessments due to recurrent falls.  Hypertension: Was on  bisoprolol.    History of chronic diastolic CHF: Meds stopped for comfort care.  CKD stage III: Kidney function was on baseline.   Anemia of chronic disease:  Currently H and H was  Stable.  Hypothyroidism: TSH was stable.   Severe deconditioning/functional paraplegia: History of severe osteoarthritis, back surgery.  She is chronically bedbound.   Multiple comorbidities/poor prognosis:Initiated full comfort care as per palliative care. Anticipate hospital death.If she improves,plan is for hospice.   DVT prophylaxis: None Code Status: Comfort care Family Communication: Discussed with family at bed side disposition Plan: Hospital death vs Hospice   Consultants: None  Procedures: None  Antimicrobials: None  Subjective: Patient lethargic,unresponsive this morning. On comfort care.  Family at the bedside ,no new issues.  Objective: Vitals:   06/28/17 0815 06/28/17 0835 06/28/17 0838 06/28/17 2011  BP:   (!) 102/27   Pulse:  64 65   Resp:  (!) 25 (!) 24 (!) 23  Temp:      TempSrc:      SpO2: (!) 86% 97% (!) 89%   Weight:      Height:        Intake/Output Summary (Last 24 hours) at 07/01/2017 1002 Last data filed at 06/30/2017 1900 Gross per 24 hour  Intake 169.48 ml  Output 200 ml  Net -30.52 ml   Filed Weights   07/01/2017 2149  Weight: 90.6 kg (199 lb 11.8 oz)    Examination:  General exam: Unresponsive, Labored breathing, actively dying    Data Reviewed: I have personally reviewed following labs and imaging studies  CBC: Recent Labs  Lab 06/21/2017 1336 06/25/17 0557 06/26/17 0233 06/27/17 0206 06/28/17 0335  WBC 17.5* 17.4* 19.7* 21.8* 22.9*  NEUTROABS 13.6*  --   --  16.8* 16.7*  HGB 11.7* 11.2* 10.9* 10.7* 11.0*  HCT 36.7 36.2 35.3* 34.1* 36.2  MCV 92.9 94.5 94.9 95.5 96.8  PLT 511* 490* 482* 512* 506*   Basic Metabolic Panel: Recent Labs  Lab 06/19/2017 1336 06/25/17 0557 06/26/17 0233  NA 140 142 141  K 4.3 3.8 3.5  CL 106 111 108  CO2 GLUCOSE 130* 119* 153*  BUN CREATININE 1.07* 0.95 0.96  CALCIUM 9.5 9.0 9.1   GFR: Estimated Creatinine Clearance: 50.4 mL/min (by C-G  formula based on SCr of 0.96 mg/dL). Liver Function Tests: No results for input(s): AST, ALT, ALKPHOS, BILITOT, PROT, ALBUMIN in the last 168 hours. No results for input(s): LIPASE, AMYLASE in the last 168 hours. No results for input(s): AMMONIA in the last 168 hours. Coagulation Profile: No results for input(s): INR, PROTIME in the last 168 hours. Cardiac Enzymes: No results for input(s): CKTOTAL, CKMB, CKMBINDEX, TROPONINI in the last 168 hours. BNP (last 3 results) Recent Labs    06/06/17 1441  PROBNP 45.0   HbA1C: No results for input(s): HGBA1C in the last 72 hours. CBG: No results for input(s): GLUCAP in the last 168 hours. Lipid Profile: No results for input(s): CHOL, HDL, LDLCALC, TRIG, CHOLHDL, LDLDIRECT in the last 72 hours. Thyroid Function Tests: No results for input(s): TSH, T4TOTAL, FREET4, T3FREE, THYROIDAB in the last 72 hours. Anemia Panel: No results for input(s): VITAMINB12, FOLATE, FERRITIN, TIBC, IRON, RETICCTPCT in the last 72 hours. Sepsis Labs: Recent Labs  Lab 07/17/2017 1558 07/17/2017 1801  LATICACIDVEN 2.28* 1.17    Recent Results (from the past 240 hour(s))  Culture, blood (routine x 2)     Status: None   Collection Time: 06/22/2017  2:03 PM  Result Value Ref Range Status   Specimen Description BLOOD RIGHT FOREARM  Final   Special Requests   Final    BOTTLES DRAWN AEROBIC AND ANAEROBIC Blood Culture adequate volume   Culture   Final    NO GROWTH 5 DAYS Performed at [facility], [address], [place], [place] [address]    Report Status 06/29/2017 FINAL  Final  Culture, blood (routine x 2)     Status: None   Collection Time: 06/23/2017  2:55 PM  Result Value Ref Range Status   Specimen Description BLOOD LEFT HAND  Final   Special Requests   Final    BOTTLES DRAWN AEROBIC AND ANAEROBIC Blood Culture adequate volume   Culture   Final    NO GROWTH 5 DAYS Performed at [facility], [address] [place], [place] [address]     Report Status 06/29/2017 FINAL  Final  Urine culture     Status: None   Collection Time: 07/16/2017  6:05 PM  Result Value Ref Range Status   Specimen Description URINE, RANDOM  Final   Special Requests NONE  Final   Culture   Final    NO GROWTH Performed at [facility], [address] [place], [place] [address]    Report Status 06/25/2017 FINAL  Final  MRSA PCR Screening     Status: None   Collection Time:  11:00 PM  Result Value Ref Range Status   MRSA by PCR NEGATIVE NEGATIVE Final    Comment:        The GeneXpert MRSA Assay (FDA approved for NASAL specimens only), is one component of a comprehensive MRSA colonization surveillance program. It is not intended to diagnose MRSA infection nor to guide or monitor treatment for MRSA infections. Performed at [facility]  Lab, [address] [place], [place] [address]   Culture, blood (routine x 2)     Status: None (Preliminary result)   Collection Time: 06/27/17  9:19 AM  Result Value Ref Range Status   Specimen Description BLOOD LEFT HAND  Final   Special Requests   Final    BOTTLES DRAWN AEROBIC ONLY Blood Culture results may not be optimal due to an inadequate volume of blood received in culture bottles   Culture   Final    NO GROWTH 3 DAYS Performed at [facility], [address] [place], [place] [address]    Report Status PENDING  Incomplete  Culture, blood (routine x 2)     Status: None (Preliminary result)   Collection Time: 06/27/17  9:30 AM  Result Value Ref Range Status   Specimen Description BLOOD LEFT ARM  Final   Special Requests   Final    BOTTLES DRAWN AEROBIC ONLY Blood Culture results may not be optimal due to an inadequate volume of blood received in culture bottles   Culture   Final    NO GROWTH 3 DAYS Performed at [facility], [address], [place], [place] [address]    Report Status PENDING  Incomplete  Respiratory Panel by PCR     Status: None   Collection Time:  06/27/17  3:49 PM  Result Value Ref Range Status   Adenovirus NOT DETECTED NOT DETECTED Final   Coronavirus 229E NOT DETECTED NOT DETECTED Final   Coronavirus HKU1 NOT DETECTED NOT DETECTED Final   Coronavirus NL63 NOT DETECTED NOT DETECTED Final   Coronavirus OC43 NOT DETECTED NOT DETECTED Final   Metapneumovirus NOT DETECTED NOT DETECTED Final   Rhinovirus / Enterovirus NOT DETECTED NOT DETECTED Final   Influenza A NOT DETECTED NOT DETECTED Final   Influenza B NOT DETECTED NOT DETECTED Final   Parainfluenza Virus 1 NOT DETECTED NOT DETECTED Final   Parainfluenza Virus 2 NOT DETECTED NOT DETECTED Final   Parainfluenza Virus 3 NOT DETECTED NOT DETECTED Final   Parainfluenza Virus 4 NOT DETECTED NOT DETECTED Final   Respiratory Syncytial Virus NOT DETECTED NOT DETECTED Final   Bordetella pertussis NOT DETECTED NOT DETECTED Final   Chlamydophila pneumoniae NOT DETECTED NOT DETECTED Final   Mycoplasma pneumoniae NOT DETECTED NOT DETECTED Final    Comment: Performed at [facility], [address] [place], [place] [address]         Radiology Studies: No results found.      Scheduled Meds: . glycopyrrolate  0.2 mg Intravenous Q8H  . LORazepam  0.5 mg Intravenous Q8H  . mouth rinse  15 mL Mouth Rinse BID   Continuous Infusions: . sodium chloride 10 mL/hr at 06/29/17 1309  . morphine 2 mg/hr (07/01/17 0630)     LOS: 7 days    Time spent:25 mins.       Burnadette Pop, MD [facility] Pager [phone]  If 7PM-7AM, please contact night-coverage [email or web address] Password [email or web address] 07/01/2017, 10:02 AM

## 2017-07-02 LAB — CULTURE, BLOOD (ROUTINE X 2)
CULTURE: NO GROWTH
Culture: NO GROWTH

## 2017-07-11 ENCOUNTER — Ambulatory Visit: Payer: Medicare Other | Admitting: Internal Medicine

## 2017-07-20 NOTE — Progress Notes (Signed)
TOD 1638. Family at bedside with patient. MD notified

## 2017-07-20 NOTE — Progress Notes (Signed)
PROGRESS NOTE    Debra Johnston  NFA:[id number] DOB: [birth year] DOA: 07/14/2017 PCP: Patient, No Pcp Per   Brief Narrative: Patient is a 84 year old female with past medical history of proximal A. fib not on anticoagulation, chronic diastolic CHF, chronic kidney disease stage III, hypertension, osteoarthritis, hyperlipidemia, hypothyroidism who  sent from nursing home due to progressive weakness and shortness of breath.  Patient was suspected to have healthcare associated pneumonia versus aspiration pneumonia and was started on broad-spectrum antibiotics, high flow oxygen.  Currently on comfort care.Actively dying.  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  Acute on chronic hypoxic respiratory failure: Uses oxygen at nursing home. CT as above  History of Afib :patient's CHA2DS2-VASc Score for Stroke Risk is> 2,however not a candidate for anticoagulation per previous assessments due to recurrent falls.  Hypertension: Was on  bisoprolol.    History of chronic diastolic CHF: Meds stopped for comfort care.  CKD stage III: Kidney function was on baseline.   Anemia of chronic disease:  Currently H and H was  Stable.  Hypothyroidism: TSH was stable.   Severe deconditioning/functional paraplegia: History of severe osteoarthritis, back surgery.  She is chronically bedbound.   Multiple comorbidities/poor prognosis:Initiated full comfort care as per palliative care. Anticipate hospital death.If she improves,plan is for hospice.   DVT prophylaxis: None Code Status: Comfort care Family Communication: Discussed with family at bed side disposition Plan: Hospital death vs Hospice   Consultants: None  Procedures: None  Antimicrobials: None  Subjective: Patient lethargic,unresponsive this morning. On comfort care.  Family at the bedside ,no new issues.  Objective: Vitals:   06/28/17 0815 06/28/17 0835 06/28/17 0838 06/28/17 2011  BP:   (!) 102/27   Pulse:  64 65   Resp:  (!) 25 (!) 24 (!) 23  Temp:      TempSrc:      SpO2: (!) 86% 97% (!) 89%   Weight:      Height:       No intake or output data in the 24 hours ending 07/18/2017 1405 Filed Weights   06/30/2017 2149  Weight: 90.6 kg (199 lb 11.8 oz)    Examination:  General exam: Unresponsive, Labored breathing, actively dying    Data Reviewed: I have personally reviewed following labs and imaging studies  CBC: Recent Labs  Lab 06/26/17 0233 06/27/17 0206 06/28/17 0335  WBC 19.7* 21.8* 22.9*  NEUTROABS  --  16.8* 16.7*  HGB 10.9* 10.7* 11.0*  HCT 35.3* 34.1* 36.2  MCV 94.9 95.5 96.8  PLT 482* 512* 506*   Basic Metabolic Panel: Recent Labs  Lab 06/26/17 0233  NA 141  K 3.5  CL 108  CO2 25  GLUCOSE  153*  BUN 13  CREATININE 0.96  CALCIUM 9.1   GFR: Estimated Creatinine Clearance: 50.4 mL/min (by C-G formula based on SCr of 0.96 mg/dL). Liver Function Tests: No results for input(s): AST, ALT, ALKPHOS, BILITOT, PROT, ALBUMIN in the last 168 hours. No results for input(s): LIPASE, AMYLASE in the last 168 hours. No results for input(s): AMMONIA in the last 168 hours. Coagulation  Profile: No results for input(s): INR, PROTIME in the last 168 hours. Cardiac Enzymes: No results for input(s): CKTOTAL, CKMB, CKMBINDEX, TROPONINI in the last 168 hours. BNP (last 3 results) Recent Labs    06/06/17 1441  PROBNP 45.0   HbA1C: No results for input(s): HGBA1C in the last 72 hours. CBG: No results for input(s): GLUCAP in the last 168 hours. Lipid Profile: No results for input(s): CHOL, HDL, LDLCALC, TRIG, CHOLHDL, LDLDIRECT in the last 72 hours. Thyroid Function Tests: No results for input(s): TSH, T4TOTAL, FREET4, T3FREE, THYROIDAB in the last 72 hours. Anemia Panel: No results for input(s): VITAMINB12, FOLATE, FERRITIN, TIBC, IRON, RETICCTPCT in the last 72 hours. Sepsis Labs: No results for input(s): PROCALCITON, LATICACIDVEN in the last 168 hours.  Recent Results (from the past 240 hour(s))  Culture, blood (routine x 2)     Status: None   Collection Time: 07/04/2017  2:03 PM  Result Value Ref Range Status   Specimen Description BLOOD RIGHT FOREARM  Final   Special Requests   Final    BOTTLES DRAWN AEROBIC AND ANAEROBIC Blood Culture adequate volume   Culture   Final    NO GROWTH 5 DAYS Performed at [facility], [address], [place], [place] [address]    Report Status 06/29/2017 FINAL  Final  Culture, blood (routine x 2)     Status: None   Collection Time: 07/03/2017  2:55 PM  Result Value Ref Range Status   Specimen Description BLOOD LEFT HAND  Final   Special Requests   Final    BOTTLES DRAWN AEROBIC AND ANAEROBIC Blood Culture adequate volume   Culture   Final    NO GROWTH 5 DAYS Performed at [facility], [address], [place], [place] [address]    Report Status 06/29/2017 FINAL  Final  Urine culture     Status: None   Collection Time: 06/29/2017  6:05 PM  Result Value Ref Range Status   Specimen Description URINE, RANDOM  Final   Special Requests NONE  Final   Culture   Final    NO GROWTH Performed at [facility], [address] [place], [place] [address]    Report Status 06/25/2017 FINAL  Final  MRSA PCR Screening     Status: None   Collection Time: 07/15/2017 11:00 PM  Result Value Ref Range Status   MRSA by PCR NEGATIVE NEGATIVE Final    Comment:        The GeneXpert MRSA Assay (FDA approved for NASAL specimens only), is one component of a comprehensive MRSA colonization surveillance program. It is not intended to diagnose MRSA infection nor to guide or monitor treatment for MRSA infections. Performed at [facility], [address], [place], [place] [address]   Culture, blood (routine x 2)     Status: None   Collection Time: 06/27/17  9:19 AM  Result Value Ref Range Status   Specimen Description BLOOD LEFT HAND  Final   Special Requests   Final    BOTTLES DRAWN AEROBIC ONLY Blood Culture results  may not be optimal due to an inadequate volume of blood received in culture bottles   Culture   Final    NO GROWTH 5 DAYS Performed at [facility], [address], [place], [place] [address]    Report Status July 26, 2017 FINAL  Final  Culture, blood (routine x 2)     Status: None   Collection Time: 06/27/17  9:30 AM  Result Value Ref Range Status   Specimen Description BLOOD LEFT ARM  Final   Special Requests   Final    BOTTLES DRAWN AEROBIC ONLY Blood Culture results may not be optimal due to an inadequate volume of blood received in culture bottles   Culture   Final    NO GROWTH 5 DAYS Performed at [facility], [address], [place], [place] [address]    Report Status 07/26/2017 FINAL  Final  Respiratory Panel by PCR     Status: None   Collection Time: 06/27/17  3:49 PM  Result Value Ref Range Status   Adenovirus NOT DETECTED NOT DETECTED Final   Coronavirus 229E NOT DETECTED NOT DETECTED Final   Coronavirus HKU1 NOT DETECTED NOT DETECTED Final   Coronavirus NL63 NOT DETECTED NOT DETECTED Final   Coronavirus OC43 NOT DETECTED NOT DETECTED Final   Metapneumovirus NOT  DETECTED NOT DETECTED Final   Rhinovirus / Enterovirus NOT DETECTED NOT DETECTED Final   Influenza A NOT DETECTED NOT DETECTED Final   Influenza B NOT DETECTED NOT DETECTED Final   Parainfluenza Virus 1 NOT DETECTED NOT DETECTED Final   Parainfluenza Virus 2 NOT DETECTED NOT DETECTED Final   Parainfluenza Virus 3 NOT DETECTED NOT DETECTED Final   Parainfluenza Virus 4 NOT DETECTED NOT DETECTED Final   Respiratory Syncytial Virus NOT DETECTED NOT DETECTED Final   Bordetella pertussis NOT DETECTED NOT DETECTED Final   Chlamydophila pneumoniae NOT DETECTED NOT DETECTED Final   Mycoplasma pneumoniae NOT DETECTED NOT DETECTED Final    Comment: Performed at [facility], [address], [place], [place] [address]         Radiology Studies: No results found.      Scheduled Meds: . glycopyrrolate  0.2 mg Intravenous Q8H  . LORazepam  0.5 mg Intravenous Q8H  . mouth rinse  15 mL Mouth Rinse BID   Continuous Infusions: . sodium chloride 10 mL/hr at 06/29/17 1309  . morphine 2 mg/hr (07/01/17 0630)     LOS: 8 days    Time spent:25 mins.       Burnadette Pop, MD [facility] Pager [phone]  If 7PM-7AM, please contact night-coverage [email or web address] Password [email or web address] 26-Jul-2017, 2:05 PM

## 2017-07-20 NOTE — Progress Notes (Addendum)
Palliative Medicine RN Note: Patient with agonal breathing, purple fingers and toes, and now completely unresponsive.  Prognosis minutes, likely less than an hour.  Discussed with family s/s imminent demise, as well as comfort measures. No s/s distress at this time.   Margret Chance Chalice Philbert, RN, BSN, [facility] Palliative Medicine Team 06/25/2017 3:48 PM Office [phone]   ADDENDUM: Stayed at bedside with family. Last pulse/heart sounds at 1631. No respirations as of 1638. TOD 1638. Margret Chance Ariyah Sedlack, RN, BSN, [facility] Palliative Medicine Team 07/13/2017 4:44 PM Office [phone]

## 2017-07-20 NOTE — Progress Notes (Signed)
Palliative Medicine RN Note: Patient is comfortable on the morphine infusion at /hour. Her resp rate is 4-6 with apnea of 30-40 seconds during my visit, and her respirations are very shallow, gasping at times. She is pale, and her upper extremities are becoming edematous.  Family is at bedside (daughter in law's sister). She reports periods of apnea of a minute and longer overnight.   I called her son [name], who is home sick today. We discussed a plan for me to follow up this afternoon to ensure continued comfort. There is a high risk that Debra Johnston would die during transport to a hospice facility, and hospital death is anticipated.  Margret Chance Jourdon Zimmerle, RN, BSN, [facility] Palliative Medicine Team 08-01-17 9:30 AM Office [phone]

## 2017-07-20 NOTE — Death Summary Note (Signed)
Death Summary  Debra Johnston ZOX:[id number] DOB: 1927-08-25 DOA: Jul 03, 2017  PCP: Patient, No Pcp Per  Admit date: 2017/07/03 Date of Death: 07-16-17 Time of Death: 06/27/1636 Notification:  History of present illness:  Patient is a 84 year old female with past medical history of proximal A. fib not on anticoagulation, chronic diastolic CHF, chronic kidney disease stage III, hypertension, osteoarthritis, hyperlipidemia, hypothyroidism who  sent from nursing home due to progressive weakness and shortness of breath.  Patient was suspected to have healthcare associated pneumonia versus aspiration pneumonia and was started on broad-spectrum antibiotics, high flow oxygen.  Patient's respiratory status continued to deteriorate despite being on antibiotics .Palliative care consulted after discussion with family.  She was started on comfort care measures .She expired on 07/06/2017 .    Final Diagnoses:  1.   Severe sepsis secondary to multifocal pneumonia.   The results of significant diagnostics from this hospitalization (including imaging, microbiology, ancillary and laboratory) are listed below for reference.    Significant Diagnostic Studies: Ct Chest Wo Contrast  Result Date: 06/27/2017 CLINICAL DATA:  Acute respiratory illness. Lung opacities on radiography. EXAM: CT CHEST WITHOUT CONTRAST TECHNIQUE: Multidetector CT imaging of the chest was performed following the standard protocol without IV contrast. COMPARISON:  Chest radiograph, 06/25/2017.  Chest CT, 02/18/2017. FINDINGS: Cardiovascular: Heart borderline enlarged. There are three-vessel coronary artery calcifications. No pericardial effusion. Great vessels are normal in caliber. Aortic atherosclerotic calcifications extend from the arch through the descending portion. Mediastinum/Nodes: No neck base or axillary masses or enlarged lymph nodes. There are multiple prominent mildly enlarged mediastinal lymph nodes, largest a prevascular node  measuring 17 mm in short axis. Increased soft tissue surrounds the hilar structures, which is also likely mild adenopathy. Esophagus is distended from the GE junction superiorly. It bulges against the posterior wall the trachea which is mildly flattened. Trachea is otherwise patent. Lungs/Pleura: Small pleural effusions. There are extensive, patchy, bilateral ground-glass and more confluent airspace opacities throughout both lungs, all lobes, with intervening areas of normal lung attenuation. No lung masses. No pneumothorax. Upper Abdomen: Gallstones.  No acute findings. Musculoskeletal: No fracture or acute finding. No osteoblastic or osteolytic lesions. Advanced arthropathic changes noted of both shoulders. IMPRESSION: 1. Extensive, patchy, bilateral ground-glass and more confluent airspace opacities throughout both lungs associated with small pleural effusions and mild hilar mediastinal adenopathy, presumed reactive. Findings are consistent with extensive pneumonia. 2. Coronary artery calcifications.  Aortic atherosclerosis. 3. Distended esophagus. Esophagus was also distended on the prior CT. Consider achalasia in the proper clinical setting. Aortic Atherosclerosis (ICD10-I70.0). Electronically Signed   By: Amie Portland M.D.   On: 06/27/2017 21:04   Dg Chest Port 1 View  Result Date: 06/25/2017 CLINICAL DATA:  Short of breath, tachypnea EXAM: PORTABLE CHEST 1 VIEW COMPARISON:  Portable chest x-ray of 07-03-2017 FINDINGS: There are now diffuse lung opacities bilaterally left more numerous than right most consistent with multifocal pneumonia. No definite pleural effusion is seen. Cardiomegaly is stable. Some retraction of the tracheal air shadow toward the right lung apex may indicate scarring secondary to prior disease. There are degenerative changes noted in the left shoulder. IMPRESSION: 1. Worsening of diffuse lung opacities most consistent with progression multifocal pneumonia. Recommend continued  followup. 2. Stable mild cardiomegaly. Electronically Signed   By: Dwyane Dee M.D.   On: 06/25/2017 10:29   Dg Chest Portable 1 View  Result Date: 07-03-2017 CLINICAL DATA:  Shortness of breath EXAM: PORTABLE CHEST 1 VIEW COMPARISON:  06/06/2017 FINDINGS: Check shadow is stable.  Elevation of the right hemidiaphragm is again identified. Right upper lobe infiltrate is noted new from the prior exam. No acute bony abnormality is noted. IMPRESSION: Right upper lobe infiltrate Electronically Signed   By: Alcide Clever M.D.   On: 07/14/2017 13:58    Microbiology: Recent Results (from the past 240 hour(s))  Respiratory Panel by PCR     Status: None   Collection Time: 06/27/17  3:49 PM  Result Value Ref Range Status   Adenovirus NOT DETECTED NOT DETECTED Final   Coronavirus 229E NOT DETECTED NOT DETECTED Final   Coronavirus HKU1 NOT DETECTED NOT DETECTED Final   Coronavirus NL63 NOT DETECTED NOT DETECTED Final   Coronavirus OC43 NOT DETECTED NOT DETECTED Final   Metapneumovirus NOT DETECTED NOT DETECTED Final   Rhinovirus / Enterovirus NOT DETECTED NOT DETECTED Final   Influenza A NOT DETECTED NOT DETECTED Final   Influenza B NOT DETECTED NOT DETECTED Final   Parainfluenza Virus 1 NOT DETECTED NOT DETECTED Final   Parainfluenza Virus 2 NOT DETECTED NOT DETECTED Final   Parainfluenza Virus 3 NOT DETECTED NOT DETECTED Final   Parainfluenza Virus 4 NOT DETECTED NOT DETECTED Final   Respiratory Syncytial Virus NOT DETECTED NOT DETECTED Final   Bordetella pertussis NOT DETECTED NOT DETECTED Final   Chlamydophila pneumoniae NOT DETECTED NOT DETECTED Final   Mycoplasma pneumoniae NOT DETECTED NOT DETECTED Final    Comment: Performed at [facility], [address], [place], [place] [address]     Labs: Basic Metabolic Panel: No results for input(s): NA, K, CL, CO2, GLUCOSE, BUN, CREATININE, CALCIUM, MG, PHOS in the last 168 hours. Liver Function Tests: No results for input(s): AST, ALT,  ALKPHOS, BILITOT, PROT, ALBUMIN in the last 168 hours. No results for input(s): LIPASE, AMYLASE in the last 168 hours. No results for input(s): AMMONIA in the last 168 hours. CBC: No results for input(s): WBC, NEUTROABS, HGB, HCT, MCV, PLT in the last 168 hours. Cardiac Enzymes: No results for input(s): CKTOTAL, CKMB, CKMBINDEX, TROPONINI in the last 168 hours. D-Dimer No results for input(s): DDIMER in the last 72 hours. BNP: Invalid input(s): POCBNP CBG: No results for input(s): GLUCAP in the last 168 hours. Anemia work up No results for input(s): VITAMINB12, FOLATE, FERRITIN, TIBC, IRON, RETICCTPCT in the last 72 hours. Urinalysis    Component Value Date/Time   COLORURINE AMBER (A) 06/27/2017 0845   APPEARANCEUR HAZY (A) 06/27/2017 0845   LABSPEC 1.031 (H) 06/27/2017 0845   PHURINE 5.0 06/27/2017 0845   GLUCOSEU NEGATIVE 06/27/2017 0845   GLUCOSEU NEGATIVE 10/22/2013 0936   HGBUR NEGATIVE 06/27/2017 0845   BILIRUBINUR SMALL (A) 06/27/2017 0845   KETONESUR NEGATIVE 06/27/2017 0845   PROTEINUR 100 (A) 06/27/2017 0845   UROBILINOGEN 0.2 10/22/2013 0936   NITRITE NEGATIVE 06/27/2017 0845   LEUKOCYTESUR TRACE (A) 06/27/2017 0845   Sepsis Labs Invalid input(s): PROCALCITONIN,  WBC,  LACTICIDVEN     SIGNED:  Burnadette Pop, MD  [facility] 07/07/2017, 10:31 AM Pager [phone]  If 7PM-7AM, please contact night-coverage [email or web address] Password [email or web address]

## 2017-07-20 NOTE — Progress Notes (Signed)
Assumed care of patient at 1500. Report received from [facility]. Patient seems to be resting comfortably in bed with eyes closed. Family is present at the bedside and denies any needs/questions at this time. Patient is comfort care and has a Morphine gtt infusing. Will continue to monitor.

## 2017-07-20 DEATH — deceased
